# Patient Record
Sex: Male | Born: 1935 | Race: White | Hispanic: No | Smoking: Never smoker
Health system: Southern US, Community
[De-identification: ages and names within clinical notes are randomized; demographics above are authoritative.]

## PROBLEM LIST (undated history)

## (undated) DIAGNOSIS — Z87442 Personal history of urinary calculi: Secondary | ICD-10-CM

## (undated) DIAGNOSIS — T8859XA Other complications of anesthesia, initial encounter: Secondary | ICD-10-CM

## (undated) DIAGNOSIS — E785 Hyperlipidemia, unspecified: Secondary | ICD-10-CM

## (undated) DIAGNOSIS — T4145XA Adverse effect of unspecified anesthetic, initial encounter: Secondary | ICD-10-CM

## (undated) DIAGNOSIS — K219 Gastro-esophageal reflux disease without esophagitis: Secondary | ICD-10-CM

## (undated) DIAGNOSIS — K4021 Bilateral inguinal hernia, without obstruction or gangrene, recurrent: Secondary | ICD-10-CM

## (undated) DIAGNOSIS — F0390 Unspecified dementia without behavioral disturbance: Secondary | ICD-10-CM

## (undated) DIAGNOSIS — I10 Essential (primary) hypertension: Secondary | ICD-10-CM

## (undated) DIAGNOSIS — I639 Cerebral infarction, unspecified: Secondary | ICD-10-CM

## (undated) DIAGNOSIS — I4891 Unspecified atrial fibrillation: Secondary | ICD-10-CM

## (undated) DIAGNOSIS — N200 Calculus of kidney: Secondary | ICD-10-CM

## (undated) DIAGNOSIS — M1990 Unspecified osteoarthritis, unspecified site: Secondary | ICD-10-CM

## (undated) HISTORY — PX: EYE SURGERY: SHX253

## (undated) HISTORY — PX: HERNIA REPAIR: SHX51

---

## 2013-07-25 ENCOUNTER — Ambulatory Visit: Payer: Self-pay | Admitting: Urology

## 2013-10-22 DIAGNOSIS — Z1630 Resistance to unspecified antimicrobial drugs: Secondary | ICD-10-CM | POA: Insufficient documentation

## 2014-02-20 DIAGNOSIS — I4891 Unspecified atrial fibrillation: Secondary | ICD-10-CM

## 2014-02-20 HISTORY — DX: Unspecified atrial fibrillation: I48.91

## 2014-09-20 DIAGNOSIS — G301 Alzheimer's disease with late onset: Secondary | ICD-10-CM

## 2014-09-20 DIAGNOSIS — F0280 Dementia in other diseases classified elsewhere without behavioral disturbance: Secondary | ICD-10-CM | POA: Insufficient documentation

## 2014-10-13 DIAGNOSIS — R194 Change in bowel habit: Secondary | ICD-10-CM | POA: Insufficient documentation

## 2014-10-13 DIAGNOSIS — R198 Other specified symptoms and signs involving the digestive system and abdomen: Secondary | ICD-10-CM | POA: Insufficient documentation

## 2014-10-13 DIAGNOSIS — K529 Noninfective gastroenteritis and colitis, unspecified: Secondary | ICD-10-CM | POA: Insufficient documentation

## 2014-10-14 ENCOUNTER — Other Ambulatory Visit
Admission: RE | Admit: 2014-10-14 | Discharge: 2014-10-14 | Disposition: A | Discharge status: Home / Self Care | Payer: Medicare Other | Source: Ambulatory Visit | Attending: Nurse Practitioner | Admitting: Nurse Practitioner

## 2014-10-14 DIAGNOSIS — K529 Noninfective gastroenteritis and colitis, unspecified: Secondary | ICD-10-CM | POA: Insufficient documentation

## 2014-10-14 LAB — C DIFFICILE QUICK SCREEN W PCR REFLEX
C Diff antigen: NEGATIVE
C Diff interpretation: NEGATIVE
C Diff toxin: NEGATIVE

## 2014-12-09 ENCOUNTER — Other Ambulatory Visit: Payer: Self-pay

## 2014-12-09 ENCOUNTER — Inpatient Hospital Stay
Admission: EM | Admit: 2014-12-09 | Discharge: 2014-12-14 | DRG: 872 | Disposition: A | Discharge status: Home / Self Care | Payer: Medicare Other | Attending: Internal Medicine | Admitting: Internal Medicine

## 2014-12-09 ENCOUNTER — Encounter: Payer: Self-pay | Admitting: *Deleted

## 2014-12-09 ENCOUNTER — Emergency Department: Payer: Medicare Other

## 2014-12-09 DIAGNOSIS — N200 Calculus of kidney: Secondary | ICD-10-CM

## 2014-12-09 DIAGNOSIS — A419 Sepsis, unspecified organism: Secondary | ICD-10-CM | POA: Diagnosis not present

## 2014-12-09 DIAGNOSIS — R188 Other ascites: Secondary | ICD-10-CM | POA: Diagnosis present

## 2014-12-09 DIAGNOSIS — Z9889 Other specified postprocedural states: Secondary | ICD-10-CM

## 2014-12-09 DIAGNOSIS — I4891 Unspecified atrial fibrillation: Secondary | ICD-10-CM | POA: Diagnosis present

## 2014-12-09 DIAGNOSIS — M549 Dorsalgia, unspecified: Secondary | ICD-10-CM | POA: Diagnosis not present

## 2014-12-09 DIAGNOSIS — E785 Hyperlipidemia, unspecified: Secondary | ICD-10-CM | POA: Diagnosis present

## 2014-12-09 DIAGNOSIS — Z79899 Other long term (current) drug therapy: Secondary | ICD-10-CM

## 2014-12-09 DIAGNOSIS — N179 Acute kidney failure, unspecified: Secondary | ICD-10-CM | POA: Diagnosis present

## 2014-12-09 DIAGNOSIS — I10 Essential (primary) hypertension: Secondary | ICD-10-CM | POA: Diagnosis present

## 2014-12-09 DIAGNOSIS — N132 Hydronephrosis with renal and ureteral calculous obstruction: Secondary | ICD-10-CM | POA: Diagnosis present

## 2014-12-09 DIAGNOSIS — N3000 Acute cystitis without hematuria: Secondary | ICD-10-CM

## 2014-12-09 DIAGNOSIS — N1330 Unspecified hydronephrosis: Secondary | ICD-10-CM

## 2014-12-09 DIAGNOSIS — Z87442 Personal history of urinary calculi: Secondary | ICD-10-CM

## 2014-12-09 DIAGNOSIS — N390 Urinary tract infection, site not specified: Secondary | ICD-10-CM | POA: Diagnosis present

## 2014-12-09 DIAGNOSIS — I472 Ventricular tachycardia: Secondary | ICD-10-CM | POA: Diagnosis present

## 2014-12-09 DIAGNOSIS — Z8249 Family history of ischemic heart disease and other diseases of the circulatory system: Secondary | ICD-10-CM

## 2014-12-09 DIAGNOSIS — E119 Type 2 diabetes mellitus without complications: Secondary | ICD-10-CM | POA: Diagnosis present

## 2014-12-09 HISTORY — DX: Essential (primary) hypertension: I10

## 2014-12-09 HISTORY — DX: Unspecified atrial fibrillation: I48.91

## 2014-12-09 HISTORY — DX: Calculus of kidney: N20.0

## 2014-12-09 HISTORY — DX: Hyperlipidemia, unspecified: E78.5

## 2014-12-09 LAB — CBC WITH DIFFERENTIAL/PLATELET
BASOS ABS: 0.1 10*3/uL (ref 0–0.1)
BASOS PCT: 0 %
EOS ABS: 0 10*3/uL (ref 0–0.7)
EOS PCT: 0 %
HCT: 45.6 % (ref 40.0–52.0)
Hemoglobin: 15 g/dL (ref 13.0–18.0)
Lymphocytes Relative: 2 %
Lymphs Abs: 0.5 10*3/uL — ABNORMAL LOW (ref 1.0–3.6)
MCH: 30.1 pg (ref 26.0–34.0)
MCHC: 32.8 g/dL (ref 32.0–36.0)
MCV: 91.6 fL (ref 80.0–100.0)
MONO ABS: 1.2 10*3/uL — AB (ref 0.2–1.0)
MONOS PCT: 6 %
NEUTROS ABS: 20.9 10*3/uL — AB (ref 1.4–6.5)
Neutrophils Relative %: 92 %
PLATELETS: 321 10*3/uL (ref 150–440)
RBC: 4.98 MIL/uL (ref 4.40–5.90)
RDW: 13.6 % (ref 11.5–14.5)
WBC: 22.8 10*3/uL — ABNORMAL HIGH (ref 3.8–10.6)

## 2014-12-09 LAB — PROTIME-INR
INR: 1.68
PROTHROMBIN TIME: 20 s — AB (ref 11.4–15.0)

## 2014-12-09 LAB — BLOOD GAS, VENOUS
Acid-Base Excess: 1.1 mmol/L (ref 0.0–3.0)
Bicarbonate: 22.9 mEq/L (ref 21.0–28.0)
FIO2: 0.21
PATIENT TEMPERATURE: 37
pCO2, Ven: 28 mmHg — ABNORMAL LOW (ref 44.0–60.0)
pH, Ven: 7.52 — ABNORMAL HIGH (ref 7.320–7.430)

## 2014-12-09 LAB — URINALYSIS COMPLETE WITH MICROSCOPIC (ARMC ONLY)
BACTERIA UA: NONE SEEN
Bilirubin Urine: NEGATIVE
GLUCOSE, UA: NEGATIVE mg/dL
KETONES UR: NEGATIVE mg/dL
NITRITE: NEGATIVE
PH: 5 (ref 5.0–8.0)
Protein, ur: NEGATIVE mg/dL
Specific Gravity, Urine: 1.018 (ref 1.005–1.030)

## 2014-12-09 LAB — COMPREHENSIVE METABOLIC PANEL
ALBUMIN: 4.1 g/dL (ref 3.5–5.0)
ALK PHOS: 82 U/L (ref 38–126)
ALT: 18 U/L (ref 17–63)
AST: 25 U/L (ref 15–41)
Anion gap: 16 — ABNORMAL HIGH (ref 5–15)
BILIRUBIN TOTAL: 1.8 mg/dL — AB (ref 0.3–1.2)
BUN: 47 mg/dL — AB (ref 6–20)
CALCIUM: 9.7 mg/dL (ref 8.9–10.3)
CO2: 23 mmol/L (ref 22–32)
CREATININE: 3.23 mg/dL — AB (ref 0.61–1.24)
Chloride: 99 mmol/L — ABNORMAL LOW (ref 101–111)
GFR calc Af Amer: 19 mL/min — ABNORMAL LOW (ref >60)
GFR calc non Af Amer: 17 mL/min — ABNORMAL LOW (ref >60)
GLUCOSE: 184 mg/dL — AB (ref 65–99)
Potassium: 4.5 mmol/L (ref 3.5–5.1)
Sodium: 138 mmol/L (ref 135–145)
TOTAL PROTEIN: 7.6 g/dL (ref 6.5–8.1)

## 2014-12-09 LAB — LACTIC ACID, PLASMA: Lactic Acid, Venous: 2.9 mmol/L (ref 0.5–2.0)

## 2014-12-09 LAB — TROPONIN I: Troponin I: <0.03 ng/mL (ref <0.031)

## 2014-12-09 LAB — APTT: aPTT: 37 seconds — ABNORMAL HIGH (ref 24–36)

## 2014-12-09 MED ORDER — PIPERACILLIN-TAZOBACTAM 4.5 G IVPB: 4.5000 g | Freq: Three times a day (TID) | INTRAVENOUS | Status: DC

## 2014-12-09 MED ORDER — SODIUM CHLORIDE 0.9 % IV BOLUS (SEPSIS)
1000.0000 mL | Freq: Once | INTRAVENOUS | Status: AC
  Administered 2014-12-09: 1000 mL via INTRAVENOUS

## 2014-12-09 MED ORDER — FENTANYL CITRATE (PF) 100 MCG/2ML IJ SOLN
50.0000 ug | Freq: Once | INTRAMUSCULAR | Status: AC
  Administered 2014-12-09: 50 ug via INTRAVENOUS
  Filled 2014-12-09: qty 2

## 2014-12-09 NOTE — Progress Notes (Addendum)
ANTIBIOTIC CONSULT NOTE - INITIAL  Pharmacy Consult for Zosyn dosing Indication: intra-abdominal infection  No Known Allergies  Patient Measurements: Height: 5\' 5"  (165.1 cm) Weight: 148 lb (67.132 kg) IBW/kg (Calculated) : 61.5 Adjusted Body Weight: n/a  Vital Signs: Temp: 97.9 F (36.6 C) (10/19 2133) Temp Source: Oral (10/19 2133) BP: 111/72 mmHg (10/19 2316) Pulse Rate: 95 (10/19 2316) Intake/Output from previous day:   Intake/Output from this shift:    Labs:  Recent Labs  12/09/14 2144  WBC 22.8*  HGB 15.0  PLT 321   CrCl cannot be calculated (Patient has no serum creatinine result on file.). No results for input(s): VANCOTROUGH, VANCOPEAK, VANCORANDOM, GENTTROUGH, GENTPEAK, GENTRANDOM, TOBRATROUGH, TOBRAPEAK, TOBRARND, AMIKACINPEAK, AMIKACINTROU, AMIKACIN in the last 72 hours.   Microbiology: No results found for this or any previous visit (from the past 720 hour(s)).  Medical History: No past medical history on file.  Medications:   Assessment: UA: LE(+) NO2(-) WBC 6-30 Blood and urine cx pending  Goal of Therapy:  Resolution of infection  Plan:  Zosyn 3.375 grams q 12 hours ordered for CrCl<20 mL/min. May need to increase as renal function improves.   Abou Sterkel S 12/09/2014,11:42 PM

## 2014-12-09 NOTE — ED Provider Notes (Addendum)
[facility] Emergency Department Provider Note  ____________________________________________   I have reviewed the triage vital signs and the nursing notes.   HISTORY  Chief Complaint Abdominal Pain    HPI William RegulusJames B Alejos Sr. is a 81 y.o. male with multiple medical problems including atrial fibrillation, kidney stones presents today with 1 week of nausea vomiting and diarrhea in 4-5 days of back pain on the left back. He has had no fevers that he can think of. He was started on Cipro by the [facility]. He has had no chest pain and no shortness of breath. He states that the pain is severe and persistent. He also has lower abdominal pain. He has not had any hematemesis or bright red blood per rectum he denies dysuria.   No past medical history on file.  There are no active problems to display for this patient.   No past surgical history on file.  No current outpatient prescriptions on file.  Allergies Review of patient's allergies indicates no known allergies.  No family history on file.  Social History Social History  Substance Use Topics  . Smoking status: Never Smoker   . Smokeless tobacco: Not on file  . Alcohol Use: No    Review of Systems Constitutional: No fever/chills Eyes: No visual changes. ENT: No sore throat. No stiff neck no neck pain Cardiovascular: Denies chest pain. Respiratory: Denies shortness of breath. Gastrointestinal:   no vomiting.  No diarrhea.  No constipation. Genitourinary: Negative for dysuria. Musculoskeletal: Negative lower extremity swelling Skin: Negative for rash. Neurological: Negative for headaches, focal weakness or numbness. 10-point ROS otherwise negative.  ____________________________________________   PHYSICAL EXAM:  VITAL SIGNS: ED Triage Vitals  Enc Vitals Group     BP 12/09/14 2133 116/54 mmHg     Pulse Rate 12/09/14 2133 92     Resp 12/09/14 2133 20     Temp 12/09/14 2133 97.9 F (36.6 C)      Temp Source 12/09/14 2133 Oral     SpO2 12/09/14 2133 100 %     Weight 12/09/14 2133 148 lb (67.132 kg)     Height 12/09/14 2133 5\' 5"  (1.651 m)     Head Cir --      Peak Flow --      Pain Score 12/09/14 2135 10     Pain Loc --      Pain Edu? --      Excl. in GC? --     Constitutional: Alert and oriented. Well appearing and in no acute distress. Eyes: Conjunctivae are normal. PERRL. EOMI. Head: Atraumatic. Nose: No congestion/rhinnorhea. Mouth/Throat: Mucous membranes are moist.  Oropharynx non-erythematous. Neck: No stridor.   Nontender with no meningismus Cardiovascular: Normal rate, regular rhythm. Grossly normal heart sounds.  Good peripheral circulation. Respiratory: Normal respiratory effort.  No retractions. Lungs CTAB. Gastrointestinal:there is some mild suprapubic tenderness distention. No guarding no rebound Back:  There is no focal tenderness or step off there is no midline tenderness there are no lesions noted. thereminimal left-sided CVA tendernesssculoskeletal: No lower extremity tenderness. No joint effusions, no DVT signs strong distal pulses no edema Neurologic:  Normal speech and language. No gross focal neurologic deficits are appreciated.  Skin:  Skin is warm, dry and intact. No rash noted. Psychiatric: Mood and affect are normal. Speech and behavior are normal.  ____________________________________________   LABS (all labs ordered are listed, but only abnormal results are displayed)  Labs Reviewed  CBC WITH DIFFERENTIAL/PLATELET - Abnormal; Notable for  the following:    WBC 22.8 (*)    Neutro Abs 20.9 (*)    Lymphs Abs 0.5 (*)    Monocytes Absolute 1.2 (*)    All other components within normal limits  URINALYSIS COMPLETEWITH MICROSCOPIC ([facility] ONLY) - Abnormal; Notable for the following:    Color, Urine YELLOW (*)    APPearance CLOUDY (*)    Hgb urine dipstick 3+ (*)    Leukocytes, UA 1+ (*)    Squamous Epithelial / LPF 0-5 (*)    All other  components within normal limits  TROPONIN I  COMPREHENSIVE METABOLIC PANEL  LACTIC ACID, PLASMA  LACTIC ACID, PLASMA  BLOOD GAS, VENOUS  PROTIME-INR  APTT   ____________________________________________  EKG I personally interpreted interpreted EKG EKG shows atrial fibrillation with what appears to be nonsustained V. tach, 4 beats no acute ST elevation or depression nonspecific ST changes ____________________________________________  RADIOLOGY   I have personally looked at CT   ________________________________________   PROCEDURES  Procedure(s) performed: None  Critical Care performed: None  ____________________________________________   INITIAL IMPRESSION / ASSESSMENT AND PLAN / ED COURSE  Pertinent labs & imaging results that were available during my care of the patient were reviewed by me and considered in my medical decision making (see chart for details). CONCERNING HISTORY WITH AN ELDERLY GENTLEMAN WITH LOW BACK PAIN ON THE LEFT FOR 4 DAYS AS WELL AS NAUSEA VOMITING AND DIARRHEA. WHITE COUNT IS ELEVATED. BLOOD PRESSURE AND VITAL SIGNS ARE REASSURING AT THIS TIME. WE'RE GIVING HIM IV FLUID AS WELL as pain medication. We are awaiting results of CT given his history of kidney stones. Ischemia is also possibility although I have lower suspicion of that. We will see what CT and blood work shows. Considered also exists for an occasional run of NSVT which is not causing any chest pain or shortness of breath. Electrolytes are pending. Signed out to Dr. Manson Passey at the end of my shift   FINAL CLINICAL IMPRESSION(S) / ED DIAGNOSES  Final diagnoses:  Back pain     Jeanmarie Plant, MD 12/09/14 2312  Jeanmarie Plant, MD 12/09/14 2329  Jeanmarie Plant, MD 12/09/14 2332

## 2014-12-09 NOTE — ED Notes (Addendum)
Pt to triage via wheelchair.  Pt reports abd pain for 1 week. [facility].  States worse tonight with vomiting and nausea. .  Pt was started on cipro and flagyl today for food poisoning.  Pt also reports xrays showed kidney stones.  Pt also has low back pain.

## 2014-12-10 ENCOUNTER — Inpatient Hospital Stay: Payer: Medicare Other

## 2014-12-10 ENCOUNTER — Inpatient Hospital Stay: Payer: Medicare Other | Admitting: Anesthesiology

## 2014-12-10 ENCOUNTER — Other Ambulatory Visit (HOSPITAL_COMMUNITY): Payer: Self-pay | Admitting: Internal Medicine

## 2014-12-10 ENCOUNTER — Inpatient Hospital Stay: Admit: 2014-12-10 | Payer: Self-pay | Admitting: Urology

## 2014-12-10 ENCOUNTER — Encounter
Admission: EM | Disposition: A | Discharge status: Home / Self Care | Payer: Self-pay | Source: Home / Self Care | Attending: Internal Medicine

## 2014-12-10 ENCOUNTER — Encounter: Payer: Self-pay | Admitting: Anesthesiology

## 2014-12-10 DIAGNOSIS — Z79899 Other long term (current) drug therapy: Secondary | ICD-10-CM | POA: Diagnosis not present

## 2014-12-10 DIAGNOSIS — N178 Other acute kidney failure: Secondary | ICD-10-CM

## 2014-12-10 DIAGNOSIS — Z8249 Family history of ischemic heart disease and other diseases of the circulatory system: Secondary | ICD-10-CM | POA: Diagnosis not present

## 2014-12-10 DIAGNOSIS — A419 Sepsis, unspecified organism: Secondary | ICD-10-CM | POA: Diagnosis present

## 2014-12-10 DIAGNOSIS — I10 Essential (primary) hypertension: Secondary | ICD-10-CM | POA: Diagnosis not present

## 2014-12-10 DIAGNOSIS — N179 Acute kidney failure, unspecified: Secondary | ICD-10-CM | POA: Diagnosis not present

## 2014-12-10 DIAGNOSIS — E119 Type 2 diabetes mellitus without complications: Secondary | ICD-10-CM | POA: Diagnosis not present

## 2014-12-10 DIAGNOSIS — N132 Hydronephrosis with renal and ureteral calculous obstruction: Secondary | ICD-10-CM | POA: Diagnosis not present

## 2014-12-10 DIAGNOSIS — R188 Other ascites: Secondary | ICD-10-CM | POA: Diagnosis not present

## 2014-12-10 DIAGNOSIS — N390 Urinary tract infection, site not specified: Secondary | ICD-10-CM | POA: Diagnosis not present

## 2014-12-10 DIAGNOSIS — I472 Ventricular tachycardia: Secondary | ICD-10-CM | POA: Diagnosis not present

## 2014-12-10 DIAGNOSIS — Z9889 Other specified postprocedural states: Secondary | ICD-10-CM | POA: Diagnosis not present

## 2014-12-10 DIAGNOSIS — M549 Dorsalgia, unspecified: Secondary | ICD-10-CM | POA: Diagnosis present

## 2014-12-10 DIAGNOSIS — Z87442 Personal history of urinary calculi: Secondary | ICD-10-CM | POA: Diagnosis not present

## 2014-12-10 DIAGNOSIS — E785 Hyperlipidemia, unspecified: Secondary | ICD-10-CM | POA: Diagnosis not present

## 2014-12-10 DIAGNOSIS — I4891 Unspecified atrial fibrillation: Secondary | ICD-10-CM | POA: Diagnosis not present

## 2014-12-10 HISTORY — PX: CYSTOSCOPY WITH STENT PLACEMENT: SHX5790

## 2014-12-10 HISTORY — PX: CYSTOSCOPY WITH RETROGRADE PYELOGRAM, URETEROSCOPY AND STENT PLACEMENT: SHX5789

## 2014-12-10 LAB — TYPE AND SCREEN
ABO/RH(D): O POS
Antibody Screen: NEGATIVE

## 2014-12-10 LAB — TSH: TSH: 3.193 u[IU]/mL (ref 0.350–4.500)

## 2014-12-10 LAB — LACTIC ACID, PLASMA: Lactic Acid, Venous: 1.4 mmol/L (ref 0.5–2.0)

## 2014-12-10 SURGERY — CYSTOSCOPY, WITH STENT INSERTION
Anesthesia: General | Laterality: Bilateral

## 2014-12-10 MED ORDER — FENTANYL CITRATE (PF) 100 MCG/2ML IJ SOLN
25.0000 ug | Freq: Once | INTRAMUSCULAR | Status: AC
  Administered 2014-12-10: 25 ug via INTRAVENOUS

## 2014-12-10 MED ORDER — ACETAMINOPHEN 10 MG/ML IV SOLN
INTRAVENOUS | Status: DC | PRN
  Administered 2014-12-10: 1000 mg via INTRAVENOUS

## 2014-12-10 MED ORDER — SODIUM CHLORIDE 0.9 % IV SOLN
INTRAVENOUS | Status: DC | PRN
  Administered 2014-12-10: 01:00:00 via INTRAVENOUS

## 2014-12-10 MED ORDER — DILTIAZEM HCL 25 MG/5ML IV SOLN
INTRAVENOUS | Status: DC | PRN
Start: 2014-12-10 — End: 2014-12-10
  Administered 2014-12-10: 10 mg via INTRAVENOUS

## 2014-12-10 MED ORDER — MORPHINE SULFATE (PF) 2 MG/ML IV SOLN: 1.0000 mg | INTRAVENOUS | Status: DC | PRN

## 2014-12-10 MED ORDER — FENTANYL CITRATE (PF) 100 MCG/2ML IJ SOLN
INTRAMUSCULAR | Status: AC
  Administered 2014-12-10: 25 ug via INTRAVENOUS
  Filled 2014-12-10: qty 2

## 2014-12-10 MED ORDER — SIMVASTATIN 20 MG PO TABS
20.0000 mg | ORAL_TABLET | Freq: Every day | ORAL | Status: DC
  Administered 2014-12-10 – 2014-12-14 (×5): 20 mg via ORAL
  Filled 2014-12-10 (×5): qty 1

## 2014-12-10 MED ORDER — DILTIAZEM HCL 25 MG/5ML IV SOLN
INTRAVENOUS | Status: AC
  Filled 2014-12-10: qty 5

## 2014-12-10 MED ORDER — VANCOMYCIN HCL IN DEXTROSE 1-5 GM/200ML-% IV SOLN
1000.0000 mg | Freq: Once | INTRAVENOUS | Status: AC
  Administered 2014-12-10: 1000 mg via INTRAVENOUS
  Filled 2014-12-10: qty 200

## 2014-12-10 MED ORDER — FENTANYL CITRATE (PF) 100 MCG/2ML IJ SOLN: 25.0000 ug | INTRAMUSCULAR | Status: DC | PRN

## 2014-12-10 MED ORDER — PIPERACILLIN-TAZOBACTAM 3.375 G IVPB
INTRAVENOUS | Status: AC
  Filled 2014-12-10: qty 50

## 2014-12-10 MED ORDER — ONDANSETRON HCL 4 MG/2ML IJ SOLN: 4.0000 mg | Freq: Four times a day (QID) | INTRAMUSCULAR | Status: DC | PRN

## 2014-12-10 MED ORDER — VANCOMYCIN HCL IN DEXTROSE 1-5 GM/200ML-% IV SOLN
1000.0000 mg | Freq: Once | INTRAVENOUS | Status: DC
Start: 2014-12-10 — End: 2014-12-10
  Filled 2014-12-10: qty 200

## 2014-12-10 MED ORDER — PROPOFOL 10 MG/ML IV BOLUS
INTRAVENOUS | Status: DC | PRN
  Administered 2014-12-10: 150 mg via INTRAVENOUS

## 2014-12-10 MED ORDER — SODIUM CHLORIDE 0.9 % IV SOLN
INTRAVENOUS | Status: DC
  Administered 2014-12-10 – 2014-12-14 (×10): via INTRAVENOUS

## 2014-12-10 MED ORDER — ONDANSETRON HCL 4 MG PO TABS: 4.0000 mg | ORAL_TABLET | Freq: Four times a day (QID) | ORAL | Status: DC | PRN

## 2014-12-10 MED ORDER — ACETAMINOPHEN 10 MG/ML IV SOLN
INTRAVENOUS | Status: AC
  Filled 2014-12-10: qty 100

## 2014-12-10 MED ORDER — METOPROLOL SUCCINATE ER 25 MG PO TB24
25.0000 mg | ORAL_TABLET | Freq: Every day | ORAL | Status: DC
  Administered 2014-12-11 – 2014-12-14 (×4): 25 mg via ORAL
  Filled 2014-12-10 (×5): qty 1

## 2014-12-10 MED ORDER — FENTANYL CITRATE (PF) 100 MCG/2ML IJ SOLN
INTRAMUSCULAR | Status: DC | PRN
  Administered 2014-12-10: 100 ug via INTRAVENOUS

## 2014-12-10 MED ORDER — ONDANSETRON 4 MG PO TBDP: 4.0000 mg | ORAL_TABLET | Freq: Three times a day (TID) | ORAL | Status: DC | PRN

## 2014-12-10 MED ORDER — PANTOPRAZOLE SODIUM 40 MG PO TBEC
40.0000 mg | DELAYED_RELEASE_TABLET | Freq: Every day | ORAL | Status: DC
  Administered 2014-12-10 – 2014-12-14 (×5): 40 mg via ORAL
  Filled 2014-12-10 (×5): qty 1

## 2014-12-10 MED ORDER — SODIUM CHLORIDE 0.9 % IV BOLUS (SEPSIS)
1000.0000 mL | Freq: Once | INTRAVENOUS | Status: AC
  Administered 2014-12-10: 1000 mL via INTRAVENOUS

## 2014-12-10 MED ORDER — ONDANSETRON HCL 4 MG/2ML IJ SOLN
4.0000 mg | Freq: Once | INTRAMUSCULAR | Status: AC
  Administered 2014-12-10: 4 mg via INTRAVENOUS

## 2014-12-10 MED ORDER — APIXABAN 5 MG PO TABS
5.0000 mg | ORAL_TABLET | Freq: Two times a day (BID) | ORAL | Status: DC
  Administered 2014-12-10 – 2014-12-14 (×9): 5 mg via ORAL
  Filled 2014-12-10 (×9): qty 1

## 2014-12-10 MED ORDER — IOTHALAMATE MEGLUMINE 43 % IV SOLN
INTRAVENOUS | Status: DC | PRN
  Administered 2014-12-10: 25 mL via URETHRAL

## 2014-12-10 MED ORDER — PHENYLEPHRINE HCL 10 MG/ML IJ SOLN
INTRAMUSCULAR | Status: DC | PRN
  Administered 2014-12-10 (×2): 200 ug via INTRAVENOUS

## 2014-12-10 MED ORDER — ONDANSETRON HCL 4 MG/2ML IJ SOLN
INTRAMUSCULAR | Status: AC
  Filled 2014-12-10: qty 2

## 2014-12-10 MED ORDER — ACETAMINOPHEN 325 MG PO TABS
650.0000 mg | ORAL_TABLET | Freq: Four times a day (QID) | ORAL | Status: DC | PRN
  Administered 2014-12-11 – 2014-12-12 (×2): 650 mg via ORAL
  Filled 2014-12-10 (×2): qty 2

## 2014-12-10 MED ORDER — FENTANYL CITRATE (PF) 100 MCG/2ML IJ SOLN
INTRAMUSCULAR | Status: AC
  Filled 2014-12-10: qty 2

## 2014-12-10 MED ORDER — HYDROMORPHONE HCL 1 MG/ML IJ SOLN
INTRAMUSCULAR | Status: DC | PRN
  Administered 2014-12-10 (×2): 0.5 mg via INTRAVENOUS

## 2014-12-10 MED ORDER — ACETAMINOPHEN 650 MG RE SUPP: 650.0000 mg | Freq: Four times a day (QID) | RECTAL | Status: DC | PRN

## 2014-12-10 MED ORDER — VANCOMYCIN HCL IN DEXTROSE 750-5 MG/150ML-% IV SOLN
750.0000 mg | INTRAVENOUS | Status: DC
  Administered 2014-12-11: 750 mg via INTRAVENOUS
  Filled 2014-12-10 (×2): qty 150

## 2014-12-10 MED ORDER — SODIUM CHLORIDE 0.9 % IJ SOLN: 3.0000 mL | Freq: Two times a day (BID) | INTRAMUSCULAR | Status: DC

## 2014-12-10 MED ORDER — ONDANSETRON HCL 4 MG/2ML IJ SOLN: 4.0000 mg | Freq: Once | INTRAMUSCULAR | Status: DC | PRN

## 2014-12-10 MED ORDER — PIPERACILLIN-TAZOBACTAM 3.375 G IVPB
3.3750 g | Freq: Two times a day (BID) | INTRAVENOUS | Status: DC
  Administered 2014-12-10 (×3): 3.375 g via INTRAVENOUS
  Filled 2014-12-10 (×3): qty 50

## 2014-12-10 MED ORDER — LIDOCAINE HCL (CARDIAC) 20 MG/ML IV SOLN
INTRAVENOUS | Status: DC | PRN
  Administered 2014-12-10: 100 mg via INTRAVENOUS

## 2014-12-10 MED ORDER — PNEUMOCOCCAL VAC POLYVALENT 25 MCG/0.5ML IJ INJ
0.5000 mL | INJECTION | INTRAMUSCULAR | Status: AC
  Administered 2014-12-11: 0.5 mL via INTRAMUSCULAR
  Filled 2014-12-10: qty 0.5

## 2014-12-10 MED ORDER — GLYCOPYRROLATE 0.2 MG/ML IJ SOLN
INTRAMUSCULAR | Status: DC | PRN
  Administered 2014-12-10: 0.2 mg via INTRAVENOUS

## 2014-12-10 SURGICAL SUPPLY — 28 items
BACTOSHIELD CHG 4% 4OZ (MISCELLANEOUS) ×2
BAG DRAIN CYSTO-URO LG1000N (MISCELLANEOUS) ×3 IMPLANT
CATH FOLEY 2WAY  5CC 16FR (CATHETERS) ×2
CATH URETL 5X70 OPEN END (CATHETERS) ×3 IMPLANT
CATH URTH 16FR FL 2W BLN LF (CATHETERS) ×1 IMPLANT
CONRAY 43 FOR UROLOGY 50M (MISCELLANEOUS) ×3 IMPLANT
FEE TECHNICIAN ONLY PER HOUR (MISCELLANEOUS) IMPLANT
GLOVE BIO SURGEON STRL SZ7 (GLOVE) ×6 IMPLANT
GLOVE BIO SURGEON STRL SZ7.5 (GLOVE) ×3 IMPLANT
GOWN STRL REUS W/ TWL LRG LVL4 (GOWN DISPOSABLE) ×1 IMPLANT
GOWN STRL REUS W/ TWL XL LVL3 (GOWN DISPOSABLE) ×1 IMPLANT
GOWN STRL REUS W/TWL LRG LVL4 (GOWN DISPOSABLE) ×2
GOWN STRL REUS W/TWL XL LVL3 (GOWN DISPOSABLE) ×5 IMPLANT
INTRODUCER DILATOR DOUBLE (INTRODUCER) ×3 IMPLANT
KIT RM TURNOVER CYSTO AR (KITS) ×3 IMPLANT
LASER HOLMIUM FIBER SU 272UM (MISCELLANEOUS) IMPLANT
PACK CYSTO AR (MISCELLANEOUS) ×3 IMPLANT
SCRUB CHG 4% DYNA-HEX 4OZ (MISCELLANEOUS) ×1 IMPLANT
SENSORWIRE 0.038 NOT ANGLED (WIRE) ×3
SET CYSTO W/LG BORE CLAMP LF (SET/KITS/TRAYS/PACK) ×3 IMPLANT
SOL .9 NS 3000ML IRR  AL (IV SOLUTION) ×2
SOL .9 NS 3000ML IRR UROMATIC (IV SOLUTION) ×1 IMPLANT
STENT URET 6FRX24 CONTOUR (STENTS) IMPLANT
STENT URET 6FRX26 CONTOUR (STENTS) ×6 IMPLANT
SURGILUBE 2OZ TUBE FLIPTOP (MISCELLANEOUS) ×3 IMPLANT
SYRINGE IRR TOOMEY STRL 70CC (SYRINGE) ×3 IMPLANT
WATER STERILE IRR 1000ML POUR (IV SOLUTION) ×3 IMPLANT
WIRE SENSOR 0.038 NOT ANGLED (WIRE) ×1 IMPLANT

## 2014-12-10 NOTE — Progress Notes (Signed)
Notified Dr. [name], pt in Afib with heart rate up to 150. Dr. [name] in L&D, referred me to call Theodoro Gristave, CRNA.  CRNA admin 10 mg IV cardizem.  HR below 100.  Pt is resting with eyes closed in bed.

## 2014-12-10 NOTE — Progress Notes (Signed)
William MaudlinJames Lozano is a 81 y.o. male  <principal problem not specified>   SUBJECTIVE:  Pt admitted with bilateral hydronephrosis due to kidney stones with AKI and sepsis. Some rapid A-fib. Feels better after stent placement last night. Mildly hypotensive. Afebrile.  ______________________________________________________________________  ROS: Review of systems is unremarkable for any active cardiac,respiratory, GI, GU, hematologic, neurologic or psychiatric systems, 10 systems reviewed.  @CMEDLIST @  Past Medical History  Diagnosis Date  . Essential hypertension   . Atrial fibrillation (HCC)   . Kidney stones   . Hyperlipemia     Past Surgical History  Procedure Laterality Date  . Hernia repair Bilateral     inguinal    PHYSICAL EXAM:  BP 97/51 mmHg  Pulse 86  Temp(Src) 97.5 F (36.4 C) (Oral)  Resp 22  Ht 5\' 5"  (1.651 m)  Wt 70.444 kg (155 lb 4.8 oz)  BMI 25.84 kg/m2  SpO2 96%  Wt Readings from Last 3 Encounters:  12/10/14 70.444 kg (155 lb 4.8 oz)            Constitutional: NAD Neck: supple, no thyromegaly Respiratory: basilar rhonchi Cardiovascular: irregularly irregular, no murmur, no gallop Abdomen: soft, good BS, nontender Extremities: no edema Neuro: alert and oriented, no focal motor or sensory deficits  ASSESSMENT/PLAN:  Labs and imaging studies were reviewed  Will continue IV fluids and IV ABX. Clear liquid diet today. Check renal US. Await f/u from Urology. Cardiology consult ordered. Repeat labs in AM.

## 2014-12-10 NOTE — Progress Notes (Signed)
ANTIBIOTIC CONSULT NOTE - INITIAL  Pharmacy Consult for Vancyomycin/Zosyn dosing Indication: Sepsis/intra-abdominal infection  No Known Allergies  Patient Measurements: Height: 5\' 5"  (165.1 cm) Weight: 155 lb 4.8 oz (70.444 kg) IBW/kg (Calculated) : 61.5 Adjusted Body Weight: n/a  Vital Signs: Temp: 97.5 F (36.4 C) (10/20 0639) Temp Source: Oral (10/20 0639) BP: 97/51 mmHg (10/20 0727) Pulse Rate: 86 (10/20 0727) Intake/Output from previous day: 10/19 0701 - 10/20 0700 In: 1900 [I.V.:1900] Out: 400 [Urine:400] Intake/Output from this shift:    Labs:  Recent Labs  12/09/14 2144 12/09/14 2255  WBC 22.8*  --   HGB 15.0  --   PLT 321  --   CREATININE  --  3.23*   Estimated Creatinine Clearance: 15.9 mL/min (by C-G formula based on Cr of 3.23). No results for input(s): VANCOTROUGH, VANCOPEAK, VANCORANDOM, GENTTROUGH, GENTPEAK, GENTRANDOM, TOBRATROUGH, TOBRAPEAK, TOBRARND, AMIKACINPEAK, AMIKACINTROU, AMIKACIN in the last 72 hours.   Microbiology: No results found for this or any previous visit (from the past 720 hour(s)).  Medical History: Past Medical History  Diagnosis Date  . Essential hypertension   . Atrial fibrillation (HCC)   . Kidney stones   . Hyperlipemia     Medications:   Assessment: 81 yo male admitted for sepsis and possible intra-abdominal infection  Pharmacy consulted to dose vancomycin and zosyn.  UA: LE(+) NO2(-) WBC 6-30 Blood and urine cx pending  PK Parameters: Dosing Weight: 70.4 kg Ke: 0.017 T1/2: 40 hrs Vd: 49.3 L  Goal of Therapy:  Resolution of infection Vancomycin trough goal of 15-20  Plan:  Will start vancomycin 750mg  IV Q36 hours starting 18 hours after first dose for stacked dosing.  Trough prior to 4th dose. Zosyn 3.375 grams q 12 hours ordered for CrCl<20 mL/min. May need to increase antibiotic doses as renal function improves.  Thank you for the consult. Pharmacy will continue to follow with you.  Jacqualyn PoseyHaley E  Tiffani Kadow 12/10/2014,8:03 AM

## 2014-12-10 NOTE — Anesthesia Preprocedure Evaluation (Addendum)
Anesthesia Evaluation  Patient identified by MRN, date of birth, ID band Patient awake    Reviewed: Allergy & Precautions, NPO status , Patient's Chart, lab work & pertinent test results, reviewed documented beta blocker date and time   Airway Mallampati: II  TM Distance: >3 FB     Dental  (+) Chipped   Pulmonary           Cardiovascular hypertension, Pt. on medications and Pt. on home beta blockers + dysrhythmias Atrial Fibrillation      Neuro/Psych    GI/Hepatic   Endo/Other  diabetes, Type 2  Renal/GU Renal InsufficiencyRenal disease     Musculoskeletal   Abdominal   Peds  Hematology   Anesthesia Other Findings Hx of Afib. Renal insuff. INR elevated. Family states he ate at 1 pm, but vomited all of it. He has an empty stomach at present. Will proceed with LMA.  Reproductive/Obstetrics                         Anesthesia Physical Anesthesia Plan  ASA: III  Anesthesia Plan: General   Post-op Pain Management:    Induction: Intravenous  Airway Management Planned: Oral ETT and LMA  Additional Equipment:   Intra-op Plan:   Post-operative Plan:   Informed Consent: I have reviewed the patients History and Physical, chart, labs and discussed the procedure including the risks, benefits and alternatives for the proposed anesthesia with the patient or authorized representative who has indicated his/her understanding and acceptance.     Plan Discussed with: CRNA  Anesthesia Plan Comments:         Anesthesia Quick Evaluation

## 2014-12-10 NOTE — Progress Notes (Signed)
   12/10/14 1510  Clinical Encounter Type  Visited With Patient  Visit Type Initial;Spiritual support  Referral From Family  Consult/Referral To Chaplain  Spiritual Encounters  Spiritual Needs Prayer;Emotional  Stress Factors  Patient Stress Factors Exhausted;Health changes  Met w/patient. Provided pastoral care. Chap. Latifah Padin G. [place]

## 2014-12-10 NOTE — ED Provider Notes (Signed)
I assumed care of the patient at 1:00 PM from Dr. Alphonzo LemmingsMcShane. Review of the patient's CAT scan revealed: Results       CT RENAL STONE STUDY (Final result) Result time: 12/09/14 23:26:30   Final result by Rad Results In Interface (12/09/14 23:26:30)   Narrative:   CLINICAL DATA: Back and abdominal pain for 1 week. Worsening tonight with vomiting and nausea.  EXAM: CT ABDOMEN AND PELVIS WITHOUT CONTRAST  TECHNIQUE: Multidetector CT imaging of the abdomen and pelvis was performed following the standard protocol without IV contrast.  COMPARISON: CT 07/25/2013  FINDINGS: Lower chest: Linear atelectasis in the left lower lobe. The heart is mildly enlarged.  Liver: No evidence focal lesion allowing for lack contrast.  Hepatobiliary: Gallbladder physiologically distended, no calcified stone. No evidence of biliary dilatation.  Pancreas: No ductal dilatation or surrounding inflammation.  Spleen: Normal.  Adrenal glands: No nodule. Mild fullness bilaterally.  Kidneys: There is moderate bilateral hydroureteronephrosis and perinephric strandy. On the right this secondary to a 11 x 9 mm stone in the right mid ureter at the level of L3-L4. On the left this is secondary to 9 x 8 mm stone in the left mid distal ureter at the level of L5-S1. Additional nonobstructing stones in both kidneys. There is a cyst in the lower left kidney, additional small bilateral renal cysts and not well seen.  Stomach/Bowel: Stomach mildly distended with ingested contents. Small hiatal hernia. There are no dilated or thickened small bowel loops. Small volume of stool throughout the colon without colonic wall thickening. The colon is tortuous. The appendix is normal.  Vascular/Lymphatic: No retroperitoneal adenopathy. Abdominal aorta is normal in caliber. Moderate to dense atherosclerosis without aneurysm.  Reproductive: Prostate gland is enlarged measuring 5.4 cm transverse dimension.  Bladder:  Minimally distended. No wall thickening.  Other: No free air, free fluid, or intra-abdominal fluid collection. Post left inguinal hernia repair with mesh. There is fat in the right inguinal canal.  Musculoskeletal: There are no acute or suspicious osseous abnormalities. Stable degenerative change in the spine. Stable mild compression deformity superior endplate of L1.  IMPRESSION: 1. Bilateral obstructive uropathy with moderate bilateral hydroureteronephrosis. Large mid ureteral obstructing stones, 11 mm on the right and 9 mm on the left. Recommend urologic consultation. 2. Additional nonobstructing stones in both kidneys.   Electronically Signed By: Rubye OaksMelanie Ehinger M.D. On: 12/09/2014 23:26      Discussed with Dr.Budzvn urologist on-call recommendation for admission for bilateral urethral stents. Patient meets criteria for sepsis patient received 30 MLS per kilogram of IV normal saline vancomycin and Zosyn. She also discussed with Dr. Sheryle Haildiamond hospitalist on-call for evaluation. Critical care: CRITICAL CARE Performed by: Bayard MalesBROWN, [place]   Total critical care time: 30 minutes  Critical care time was exclusive of separately billable procedures and treating other patients.  Critical care was necessary to treat or prevent imminent or life-threatening deterioration.  Critical care was time spent personally by me on the following activities: development of treatment plan with patient and/or surrogate as well as nursing, discussions with consultants, evaluation of patient's response to treatment, examination of patient, obtaining history from patient or surrogate, ordering and performing treatments and interventions, ordering and review of laboratory studies, ordering and review of radiographic studies, pulse oximetry and re-evaluation of patient's condition.   Darci Currentandolph N Brown, MD 12/10/14 [phone]

## 2014-12-10 NOTE — Op Note (Signed)
Preoperative diagnosis: Bilateral obstructing ureteral stones, AKI, sepsis  Postoperative diagnosis: Same  Procedure: Cystoscopy, bilateral retrograde pyelogram, bilateral ureteral stent placement (6 [place] by 26 cm)  Surgeon: Baruch Gouty, M.D.  Findings: Bilateral hydronephrosis was noted on retrograde pyelogram. The right ureteral stent was easily placed. There was some difficulty placing the left ureteral stent due to the stent placement stone. The stent may be in the proximal ureter. There was some extravasation of contrast in the left retropyelogram. Regardless, there was a large amount of purulent discharge after stent placement from within the stent leading believe the stent is by passing the obstruction. The decision was made to leave the stent as it was as the patient is not able to undergo a nephrostomy tube now secondary to Eliquis and this was his only option for drainage of his obstructed system.  Drains: 6 [place] by 26 cm double-J ureteral stent bilaterally, 19 French Foley  Specimens: Urine culture from left ureter  Disposition: Stable to postanesthesia care unit  Indications for procedure: Patient's an 81 year old male presented with abdominal pain was done with bilateral obstructing stones. His white blood cell count is 22.8. His creatinine is 3.23. His lactic acid is 2.9. He is on Eliquis and unable to undergo percutaneous nephrostomy tube at this time.  Description of procedure: The patient was met in the preoperative area all risks, benefits, and indications of the procedure prescribed great detail. The patient consented to the procedure. Preoperative antibiotics were given. Symmetric the operative theater. Gen. anesthesia was induced per the anesthesia service. The patient was placed in dorsal lithotomy position prepped and draped in usual sterile fashion. Timeout was called. A 21 French 30 versus scope was inserted in the patient's bladder per urethra atraumatically the  right ureteral orifice was then visualized and intubated with a open ureteral catheter and a sensor wire was with some difficulty navigated past the stone and the open-ended ureteral catheter placement renal pelvis. The right renal pelvis was then aspirated and there is no nephrotic drip this time. The sensor wire was then replaced open wire was removed after retrograde pyelogram was obtained. The right retrograde pyelograms did show right hydronephrosis proximal to mid ureteral stone. A 6 French by 26 under double-J ureteral stent was then placed over the sensor wire and sensor wire removed. The correct placement of the stent was noted with a curl seen in the patient's right renal pelvis on fluoroscopy and a curl seen in the patient's urinary bladder on direct visualization. This process was repeated on the left side. It was much more difficult on this side due to the narrowness of the ureter secondary to the stone. We were able to navigate the open-ended catheter passed the ureteral stone over the sensor wire. There was immediate return of purulent discharge from  the left ureter once the stone was bypassed. The left ureter was allowed to fully decompress before performing further manipulation. This urine was sent for culture. A retrograde pyelogram obtained showed extravasation of contrast. A sensor wire was then replaced to what was thought to be the level the left renal pelvis though this was not clear due to the extravasation of contrast which was secondary to the tightness of the left ureter from the obstructing stone. A decision was then made at this time to place a left ureteral stent over the sensor wire. A 6 French by 26 double-J ureteral stent was then placed over the sensor wire and sensor wire removed. The correct placement was noted in  the bladder with direct visualization. There was immediate return of a large amount purulent discharge at this time. It was unclear if the left proximal ureteral stent  curl was in the renal pelvis due to the previously mentioned extravasation of contrast. However there continued be large amounts of purulent drainage from the left ureteral orifice and the left ureteral stent. At this time I was satisfied that I was able to bypass the obstruction, and his left kidney was draining. The decision was made not to further manipulate the stent due to the patient's overall septic status, the fact that the left ureter was draining, and the fact that the patient was not eligible for percutaneous nephrostomy tube due to his Eliquis. I was concerned that any further manipulation the stent would risk losing the current access and drainage of his left kidney. At this point the cystoscope was withdrawn and a 16 French Foley catheter was placed to dependent drainage  Plan: The patient will return to the floor and be admitted to the hospitalist. We will monitor his creatinine, white blood cell count and cultures. We may consider renal ultrasound tomorrow morning to better evaluate where the proximal left ureteral stent is placed.

## 2014-12-10 NOTE — Consult Note (Addendum)
 @ 12:41 AM   [name] Sr. January 09, 1933 [id number]  Referring provider: Dr. Manson Passey  Chief Complaint  Patient presents with  . Abdominal Pain    HPI: The patient is an 81 year old gentleman with a possible history of nephrolithiasis presents to the hospital with abdominal pain. He is diagnosed bilateral obstructing stones with bilateral hydroureteronephrosis. His white blood cell counts 22. His creatinine is 3.2. His lactic acid is 2.9. Vitals are otherwise stable this time. He has had some before. There treated with lithotripsy.  PMH: History reviewed. No pertinent past medical history.   Surgical History: History reviewed. No pertinent past surgical history.  Home Medications:    Medication List    ASK your doctor about these medications        apixaban 5 MG Tabs tablet  Commonly known as:  ELIQUIS  Take 5 mg by mouth 2 (two) times daily.     ciprofloxacin 250 MG tablet  Commonly known as:  CIPRO  Take 250 mg by mouth 2 (two) times daily.     hydrochlorothiazide 25 MG tablet  Commonly known as:  HYDRODIURIL  Take 25 mg by mouth daily.     HYDROcodone-acetaminophen 5-325 MG tablet  Commonly known as:  NORCO/VICODIN  Take 1 tablet by mouth every 4 (four) hours as needed for moderate pain.     metoprolol succinate 25 MG 24 hr tablet  Commonly known as:  TOPROL-XL  Take 25 mg by mouth daily.     ondansetron 4 MG disintegrating tablet  Commonly known as:  ZOFRAN-ODT  Take 4 mg by mouth every 8 (eight) hours as needed for nausea or vomiting.     pantoprazole 40 MG tablet  Commonly known as:  PROTONIX  Take 40 mg by mouth daily.     promethazine 12.5 MG tablet  Commonly known as:  PHENERGAN  Take 12.5 mg by mouth every 6 (six) hours as needed for nausea or vomiting.     simvastatin 20 MG tablet  Commonly known as:  ZOCOR  Take 20 mg by mouth daily.        Allergies: No Known Allergies  Family History: History reviewed. No pertinent family  history.  Social History:  reports that he has never smoked. He does not have any smokeless tobacco history on file. He reports that he does not drink alcohol. His drug history is not on file.  ROS: 12 point ROS reviewed in EMR and agreed upon.                                        Physical Exam: BP 111/76 mmHg  Pulse 84  Temp(Src) 97.9 F (36.6 C) (Oral)  Resp 16  Ht  (1.651 m)  Wt 148 lb (67.132 kg)  BMI 24.63 kg/m2  SpO2 100%  Constitutional:  Alert and oriented, No acute distress. HEENT: [place] AT, moist mucus membranes.  Trachea midline, no masses.  Cardiovascular: No clubbing, cyanosis, or edema. RRR. Respiratory: Normal respiratory effort, no increased work of breathing. GI: Abdomen is soft, nontender, nondistended, no abdominal masses GU: No CVA tenderness. Normal uncircumcised phallus. Testicles able bilaterally. No masses. Skin: No rashes, bruises or suspicious lesions. Lymph: No cervical or inguinal adenopathy. Neurologic: Grossly intact, no focal deficits, moving all 4 extremities. Psychiatric: Normal mood and affect.  Laboratory Data: Lab Results  Component Value Date   WBC 22.8* 12/09/2014  HGB 15.0 12/09/2014   HCT 45.6 12/09/2014   MCV 91.6 12/09/2014   PLT 321 12/09/2014    Lab Results  Component Value Date   CREATININE 3.23* 12/09/2014    No results found for: PSA  No results found for: TESTOSTERONE  No results found for: HGBA1C  Urinalysis    Component Value Date/Time   COLORURINE YELLOW* 12/09/2014 2144   APPEARANCEUR CLOUDY* 12/09/2014 2144   LABSPEC 1.018 12/09/2014 2144   PHURINE 5.0 12/09/2014 2144   GLUCOSEU NEGATIVE 12/09/2014 2144   HGBUR 3+* 12/09/2014 2144   BILIRUBINUR NEGATIVE 12/09/2014 2144   KETONESUR NEGATIVE 12/09/2014 2144   PROTEINUR NEGATIVE 12/09/2014 2144   NITRITE NEGATIVE 12/09/2014 2144   LEUKOCYTESUR 1+* 12/09/2014 2144    Pertinent Imaging: CLINICAL DATA: Back and abdominal pain  for 1 week. Worsening tonight with vomiting and nausea.  EXAM: CT ABDOMEN AND PELVIS WITHOUT CONTRAST  TECHNIQUE: Multidetector CT imaging of the abdomen and pelvis was performed following the standard protocol without IV contrast.  COMPARISON: CT 07/25/2013  FINDINGS: Lower chest: Linear atelectasis in the left lower lobe. The heart is mildly enlarged.  Liver: No evidence focal lesion allowing for lack contrast.  Hepatobiliary: Gallbladder physiologically distended, no calcified stone. No evidence of biliary dilatation.  Pancreas: No ductal dilatation or surrounding inflammation.  Spleen: Normal.  Adrenal glands: No nodule. Mild fullness bilaterally.  Kidneys: There is moderate bilateral hydroureteronephrosis and perinephric strandy. On the right this secondary to a 11 x 9 mm stone in the right mid ureter at the level of L3-L4. On the left this is secondary to 9 x 8 mm stone in the left mid distal ureter at the level of L5-S1. Additional nonobstructing stones in both kidneys. There is a cyst in the lower left kidney, additional small bilateral renal cysts and not well seen.  Stomach/Bowel: Stomach mildly distended with ingested contents. Small hiatal hernia. There are no dilated or thickened small bowel loops. Small volume of stool throughout the colon without colonic wall thickening. The colon is tortuous. The appendix is normal.  Vascular/Lymphatic: No retroperitoneal adenopathy. Abdominal aorta is normal in caliber. Moderate to dense atherosclerosis without aneurysm.  Reproductive: Prostate gland is enlarged measuring 5.4 cm transverse dimension.  Bladder: Minimally distended. No wall thickening.  Other: No free air, free fluid, or intra-abdominal fluid collection. Post left inguinal hernia repair with mesh. There is fat in the right inguinal canal.  Musculoskeletal: There are no acute or suspicious osseous abnormalities. Stable  degenerative change in the spine. Stable mild compression deformity superior endplate of L1.  IMPRESSION: 1. Bilateral obstructive uropathy with moderate bilateral hydroureteronephrosis. Large mid ureteral obstructing stones, 11 mm on the right and 9 mm on the left. Recommend urologic consultation. 2. Additional nonobstructing stones in both kidneys.  Assessment & Plan:   1. Bilateral obstructing nephrolithiasis next -We'll take the patient to the OR for acute placement of bilateral ureteral stents via cystoscopy. I discussed the risks, benefits, indications, and alternatives to the procedure with the patient and his family. He is aware this emergent procedure to relieve both obstruction causing sepsis and renal failure. He is agreeable to the procedure.  2. Acute renal injury -As above  3. Sepsis -Broad-spectrum antibiotic pending culture and sensitivity results -f/u culture results   Hildred LaserBrian Vipul Jacorey Donaway, MD  [facility] [address], [address] [place], [place] [address] [phone]

## 2014-12-10 NOTE — Anesthesia Procedure Notes (Signed)
Procedure Name: LMA Insertion Date/Time: 12/10/2014 1:31 AM Performed by: Shirlee LimerickMARION, Sevin Langenbach Pre-anesthesia Checklist: Patient identified, Emergency Drugs available, Suction available and Patient being monitored Patient Re-evaluated:Patient Re-evaluated prior to inductionOxygen Delivery Method: Circle system utilized Preoxygenation: Pre-oxygenation with 100% oxygen Intubation Type: IV induction LMA: LMA inserted LMA Size: 4.5 Number of attempts: 1 Tube secured with: Tape Dental Injury: Teeth and Oropharynx as per pre-operative assessment

## 2014-12-10 NOTE — Transfer of Care (Signed)
Immediate Anesthesia Transfer of Care Note  Patient: William PigeonJames B Creely Sr.  Procedure(s) Performed: Procedure(s): CYSTOSCOPY WITH STENT PLACEMENT (Bilateral) CYSTOSCOPY WITH RETROGRADE PYELOGRAM, URETEROSCOPY AND STENT PLACEMENT (Bilateral)  Patient Location: PACU  Anesthesia Type:General  Level of Consciousness: awake and patient cooperative  Airway & Oxygen Therapy: Patient Spontanous Breathing and Patient connected to nasal cannula oxygen  Post-op Assessment: Report given to RN and Post -op Vital signs reviewed and stable  Post vital signs: Reviewed and stable  Last Vitals:  Filed Vitals:   12/10/14 0229  BP: 90/67  Pulse: 128  Temp: 36.2 C  Resp: 23    Complications: No apparent anesthesia complications

## 2014-12-10 NOTE — Consult Note (Signed)
[facility]  Patient ID: William RegulusJames B Shrader Sr., MRN: [id number], DOB/AGE: 79/02/1932 79 y.o. Admit date: 12/09/2014   Date of Consult: 12/10/2014 Primary Physician: Marguarite ArbourSPARKS,JEFFREY D, MD Primary Cardiologist: Jennette [facility]  Chief Complaint:  Chief Complaint  Patient presents with  . Abdominal Pain   Reason for Consult: atrial fibrillation  HPI: 81 y.o. male with known chronic nonvalvular atrial fibrillation essential hypertension diabetes with complications who is had kidney stones requiring surgical intervention. With this surgical intervention the patient also had infection for which there was significant stress causing rapid ventricular rate of atrial fibrillation. There is no evidence of the changes in symptoms or suggestion of congestive heart failure and/or anginal equivalent at the time. The patient was placed on appropriate medication management including metoprolol and diltiazem combination intravenously which helped control heart rate to a great degree. The patient now has been replaced on his previous dose of metoprolol with better heart rate control since improvements of infection and recovery from surgery. Patient has been continued on anticoagulation for further risk reduction in stroke with atrial fibrillation without any bleeding complications at this time. The patient has had stable diabetes hypertension and hyperlipidemia on appropriate medication management  Past Medical History  Diagnosis Date  . Essential hypertension   . Atrial fibrillation (HCC)   . Kidney stones   . Hyperlipemia       Surgical History:  Past Surgical History  Procedure Laterality Date  . Hernia repair Bilateral     inguinal  . Cystoscopy with stent placement Bilateral 12/10/2014    Procedure: CYSTOSCOPY WITH STENT PLACEMENT;  Surgeon: Hildred LaserBrian Cane Budzyn, MD;  Location: [facility] ORS;  Service: Urology;  Laterality: Bilateral;  . Cystoscopy with retrograde pyelogram,  ureteroscopy and stent placement Bilateral 12/10/2014    Procedure: CYSTOSCOPY WITH RETROGRADE PYELOGRAM, URETEROSCOPY AND STENT PLACEMENT;  Surgeon: Hildred LaserBrian Gared Budzyn, MD;  Location: [facility] ORS;  Service: Urology;  Laterality: Bilateral;     Home Meds: Prior to Admission medications   Medication Sig Start Date End Date Taking? Authorizing Provider  apixaban (ELIQUIS) 5 MG TABS tablet Take 5 mg by mouth 2 (two) times daily.   Yes Historical Provider, MD  ciprofloxacin (CIPRO) 250 MG tablet Take 250 mg by mouth 2 (two) times daily.   Yes Historical Provider, MD  hydrochlorothiazide (HYDRODIURIL) 25 MG tablet Take 25 mg by mouth daily.   Yes Historical Provider, MD  HYDROcodone-acetaminophen (NORCO/VICODIN) 5-325 MG tablet Take 1 tablet by mouth every 4 (four) hours as needed for moderate pain.   Yes Historical Provider, MD  metoprolol succinate (TOPROL-XL) 25 MG 24 hr tablet Take 25 mg by mouth daily.   Yes Historical Provider, MD  ondansetron (ZOFRAN-ODT) 4 MG disintegrating tablet Take 4 mg by mouth every 8 (eight) hours as needed for nausea or vomiting.   Yes Historical Provider, MD  pantoprazole (PROTONIX) 40 MG tablet Take 40 mg by mouth daily.   Yes Historical Provider, MD  promethazine (PHENERGAN) 12.5 MG tablet Take 12.5 mg by mouth every 6 (six) hours as needed for nausea or vomiting.   Yes Historical Provider, MD  simvastatin (ZOCOR) 20 MG tablet Take 20 mg by mouth daily.   Yes Historical Provider, MD    Inpatient Medications:  . apixaban  5 mg Oral BID  . diltiazem      . diltiazem      . fentaNYL      . metoprolol succinate  25 mg Oral Daily  . pantoprazole  40 mg Oral Daily  . piperacillin-tazobactam (ZOSYN)  IV  3.375 g Intravenous Q12H  . [START ON 12/11/2014] pneumococcal 23 valent vaccine  0.5 mL Intramuscular Tomorrow-1000  . simvastatin  20 mg Oral Daily  . sodium chloride  3 mL Intravenous Q12H  . [START ON 12/11/2014] vancomycin  750 mg Intravenous Q36H   . sodium  chloride 125 mL/hr at 12/10/14 1008    Allergies: No Known Allergies  Social History   Social History  . Marital Status: Married    Spouse Name: N/A  . Number of Children: N/A  . Years of Education: N/A   Occupational History  . Not on file.   Social History Main Topics  . Smoking status: Never Smoker   . Smokeless tobacco: Not on file  . Alcohol Use: No  . Drug Use: Not on file  . Sexual Activity: Not on file   Other Topics Concern  . Not on file   Social History Narrative     Family History  Problem Relation Age of Onset  . Hypertension       Review of Systems Positive for abdominal discomfort Negative for: General:  chills, fever, night sweats or weight changes.  Cardiovascular: PND orthopnea syncope dizziness  Dermatological skin lesions rashes Respiratory: Cough congestion Urologic: Positive for hematuria Abdominal: negative for nausea, vomiting, diarrhea, bright red blood per rectum, melena, or hematemesis Neurologic: negative for visual changes, and/or hearing changes  All other systems reviewed and are otherwise negative except as noted above.  Labs:  Recent Labs  12/09/14 2255  TROPONINI <0.03   Lab Results  Component Value Date   WBC 22.8* 12/09/2014   HGB 15.0 12/09/2014   HCT 45.6 12/09/2014   MCV 91.6 12/09/2014   PLT 321 12/09/2014    Recent Labs Lab 12/09/14 2255  NA 138  K 4.5  CL 99*  CO2 23  BUN 47*  CREATININE 3.23*  CALCIUM 9.7  PROT 7.6  BILITOT 1.8*  ALKPHOS 82  ALT 18  AST 25  GLUCOSE 184*   No results found for: CHOL, HDL, LDLCALC, TRIG No results found for: DDIMER  Radiology/Studies:  US Renal  12/10/2014  CLINICAL DATA:  Hydronephrosis and bilateral calculi. Bilateral kidney stones removed last night. Gross hematuria since yesterday. EXAM: RENAL / URINARY TRACT ULTRASOUND COMPLETE COMPARISON:  CT of the abdomen and pelvis on 12/09/2014 FINDINGS: Right Kidney: Length: 10.8 cm. Multiple small cysts are  present. Largest is 1.2 x 1.1 x 1.2 cm. Small intrarenal stones are identified, too small to measure. Left Kidney: Length: 12.3 cm. Mild hydronephrosis. Small cysts are identified. Largest in the lower pole region measures 2.7 x 2.7 x 2.2 cm. Small intrarenal stones are noted, too small to measure. Bladder: Foley catheter decompresses the bladder. IMPRESSION: 1. Mild left hydronephrosis. 2. Bilateral cysts. 3. Small intrarenal calculi. Electronically Signed   By: Norva Pavlov M.D.   On: 12/10/2014 11:36   Ct Renal Stone Study  12/09/2014  CLINICAL DATA:  Back and abdominal pain for 1 week. Worsening tonight with vomiting and nausea. EXAM: CT ABDOMEN AND PELVIS WITHOUT CONTRAST TECHNIQUE: Multidetector CT imaging of the abdomen and pelvis was performed following the standard protocol without IV contrast. COMPARISON:  CT 07/25/2013 FINDINGS: Lower chest: Linear atelectasis in the left lower lobe. The heart is mildly enlarged. Liver: No evidence focal lesion allowing for lack contrast. Hepatobiliary: Gallbladder physiologically distended, no calcified stone. No evidence of biliary dilatation. Pancreas: No ductal dilatation or surrounding inflammation. Spleen:  Normal. Adrenal glands: No nodule.  Mild fullness bilaterally. Kidneys: There is moderate bilateral hydroureteronephrosis and perinephric strandy. On the right this secondary to a 11 x 9 mm stone in the right mid ureter at the level of L3-L4. On the left this is secondary to 9 x 8 mm stone in the left mid distal ureter at the level of L5-S1. Additional nonobstructing stones in both kidneys. There is a cyst in the lower left kidney, additional small bilateral renal cysts and not well seen. Stomach/Bowel: Stomach mildly distended with ingested contents. Small hiatal hernia. There are no dilated or thickened small bowel loops. Small volume of stool throughout the colon without colonic wall thickening. The colon is tortuous. The appendix is normal.  Vascular/Lymphatic: No retroperitoneal adenopathy. Abdominal aorta is normal in caliber. Moderate to dense atherosclerosis without aneurysm. Reproductive: Prostate gland is enlarged measuring 5.4 cm transverse dimension. Bladder: Minimally distended.  No wall thickening. Other: No free air, free fluid, or intra-abdominal fluid collection. Post left inguinal hernia repair with mesh. There is fat in the right inguinal canal. Musculoskeletal: There are no acute or suspicious osseous abnormalities. Stable degenerative change in the spine. Stable mild compression deformity superior endplate of L1. IMPRESSION: 1. Bilateral obstructive uropathy with moderate bilateral hydroureteronephrosis. Large mid ureteral obstructing stones, 11 mm on the right and 9 mm on the left. Recommend urologic consultation. 2. Additional nonobstructing stones in both kidneys. Electronically Signed   By: Rubye Oaks M.D.   On: 12/09/2014 23:26    EKG: Atrial fibrillation with nonspecific ST and T-wave changes  Weights: Filed Weights   12/09/14 2133 12/10/14 0639  Weight: 148 lb (67.132 kg) 155 lb 4.8 oz (70.444 kg)     Physical Exam: Blood pressure 93/65, pulse 75, temperature 98.7 F (37.1 C), temperature source Oral, resp. rate 17, height  (1.651 m), weight 155 lb 4.8 oz (70.444 kg), SpO2 96 %. Body mass index is 25.84 kg/(m^2). General: Well developed, well nourished, in no acute distress. Head eyes ears nose throat: Normocephalic, atraumatic, sclera non-icteric, no xanthomas, nares are without discharge. No apparent thyromegaly and/or mass  Lungs: Normal respiratory effort.  no wheezes, no rales, no rhonchi.  Heart: Irregular with normal S1 S2. 2+ mitral murmur gallop, no rub, PMI is normal size and placement, carotid upstroke normal without bruit, jugular venous pressure is normal Abdomen: Soft, non-tender, non-distended with normoactive bowel sounds. No hepatomegaly. No rebound/guarding. No obvious abdominal  masses. Abdominal aorta is normal size without bruit Extremities: Trace edema. no cyanosis, no clubbing, no ulcers  Peripheral : 2+ bilateral upper extremity pulses, 2+ bilateral femoral pulses, 2+ bilateral dorsal pedal pulse Neuro: Alert and oriented. No facial asymmetry. No focal deficit. Moves all extremities spontaneously. Musculoskeletal: Normal muscle tone without kyphosis Psych:  Responds to questions appropriately with a normal affect.    Assessment: 81 year old male with chronic nonvalvular trivial fibrillation with rapid ventricular rate secondary to pain and infection with the procedure now more controlled with previous dose of metoprolol not requiring further intervention at this time  Plan: 1. Continue metoprolol for heart rate control of chronic nonvalvular atrial fibrillation 2. Ambulation and follow for need in adjustments of dosage of medication management for heart rate control 3. Continue anticoagulation for further risk reduction in stroke with atrial fibrillation 4. High intensity cholesterol therapy with simvastatin 5. No restrictions to further interventions at this time if necessary for kidney stones 6. No further cardiac diagnostics necessary at this time 7. Okay for discharge home from  cardiac standpoint with follow-up next week for adjustments of medications  Signed, Lamar Blinks M.D. [facility] 12/10/2014, 1:08 PM

## 2014-12-10 NOTE — Progress Notes (Signed)
Patient alert and oriented x4. Oriented to room, unit, and call bell. Just received report from Yasmin, Charity fundraiserN. No complaints at this time. Will cont to assess. Skin assessment verified by Yasmin, RN. Telemetry box verified. Trudee KusterBrandi R Mansfield

## 2014-12-10 NOTE — H&P (Signed)
William CHITTUM Sr. is an 81 y.o. male.   Chief Complaint: Back/flank pain HPI: The patient presents emergency department with worsening back pain that radiates to his right flank. The patient has been having similar symptoms for approximately one week. He has seen in urgent care provider twice for this complaint. His last visit was today at which time possible gastroenteritis or food poisoning was suspected as the cause of his colicky pain and nausea. He denies vomiting however he admits to seeing blood in his urine today. He also denies chest pain or shortness of breath. In the emergency Department laboratory evaluation revealed significant leukocytosis. CT of the abdomen showed bilateral obstructive uropathy with large mid ureteral stones which brought to the emergency department staff to call for admission.  Past Medical History  Diagnosis Date  . Essential hypertension   . Atrial fibrillation ([place])   . Kidney stones   . Hyperlipemia     Past Surgical History  Procedure Laterality Date  . Hernia repair Bilateral     inguinal    Family History  Problem Relation Age of Onset  . Hypertension     Social History:  reports that he has never smoked. He does not have any smokeless tobacco history on file. He reports that he does not drink alcohol. His drug history is not on file.  Allergies: No Known Allergies  Prior to Admission medications   Medication Sig Start Date End Date Taking? Authorizing Provider  apixaban (ELIQUIS) 5 MG TABS tablet Take 5 mg by mouth 2 (two) times daily.   Yes Historical Provider, MD  ciprofloxacin (CIPRO) 250 MG tablet Take 250 mg by mouth 2 (two) times daily.   Yes Historical Provider, MD  hydrochlorothiazide (HYDRODIURIL) 25 MG tablet Take 25 mg by mouth daily.   Yes Historical Provider, MD  HYDROcodone-acetaminophen (NORCO/VICODIN) 5-325 MG tablet Take 1 tablet by mouth every 4 (four) hours as needed for moderate pain.   Yes Historical Provider, MD   metoprolol succinate (TOPROL-XL) 25 MG 24 hr tablet Take 25 mg by mouth daily.   Yes Historical Provider, MD  ondansetron (ZOFRAN-ODT) 4 MG disintegrating tablet Take 4 mg by mouth every 8 (eight) hours as needed for nausea or vomiting.   Yes Historical Provider, MD  pantoprazole (PROTONIX) 40 MG tablet Take 40 mg by mouth daily.   Yes Historical Provider, MD  promethazine (PHENERGAN) 12.5 MG tablet Take 12.5 mg by mouth every 6 (six) hours as needed for nausea or vomiting.   Yes Historical Provider, MD  simvastatin (ZOCOR) 20 MG tablet Take 20 mg by mouth daily.   Yes Historical Provider, MD     Results for orders placed or performed during the hospital encounter of 12/09/14 (from the past 48 hour(s))  CBC WITH DIFFERENTIAL     Status: Abnormal   Collection Time: 12/09/14  9:44 PM  Result Value Ref Range   WBC 22.8 (H) 3.8 - 10.6 K/uL   RBC 4.98 4.40 - 5.90 MIL/uL   Hemoglobin 15.0 13.0 - 18.0 g/dL   HCT 45.6 40.0 - 52.0 %   MCV 91.6 80.0 - 100.0 fL   MCH 30.1 26.0 - 34.0 pg   MCHC 32.8 32.0 - 36.0 g/dL   RDW 13.6 11.5 - 14.5 %   Platelets 321 150 - 440 K/uL   Neutrophils Relative % 92 %   Neutro Abs 20.9 (H) 1.4 - 6.5 K/uL   Lymphocytes Relative 2 %   Lymphs Abs 0.5 (L) 1.0 -  3.6 K/uL   Monocytes Relative 6 %   Monocytes Absolute 1.2 (H) 0.2 - 1.0 K/uL   Eosinophils Relative 0 %   Eosinophils Absolute 0.0 0 - 0.7 K/uL   Basophils Relative 0 %   Basophils Absolute 0.1 0 - 0.1 K/uL  Urinalysis complete, with microscopic ([facility] only)     Status: Abnormal   Collection Time: 12/09/14  9:44 PM  Result Value Ref Range   Color, Urine YELLOW (A) YELLOW   APPearance CLOUDY (A) CLEAR   Glucose, UA NEGATIVE NEGATIVE mg/dL   Bilirubin Urine NEGATIVE NEGATIVE   Ketones, ur NEGATIVE NEGATIVE mg/dL   Specific Gravity, Urine 1.018 1.005 - 1.030   Hgb urine dipstick 3+ (A) NEGATIVE   pH 5.0 5.0 - 8.0   Protein, ur NEGATIVE NEGATIVE mg/dL   Nitrite NEGATIVE NEGATIVE   Leukocytes, UA 1+  (A) NEGATIVE   RBC / HPF TOO NUMEROUS TO COUNT 0 - 5 RBC/hpf   WBC, UA 6-30 0 - 5 WBC/hpf   Bacteria, UA NONE SEEN NONE SEEN   Squamous Epithelial / LPF 0-5 (A) NONE SEEN   Mucous PRESENT    Hyaline Casts, UA PRESENT   Troponin I     Status: None   Collection Time: 12/09/14 10:55 PM  Result Value Ref Range   Troponin I <0.03 <0.031 ng/mL    Comment:        NO INDICATION OF MYOCARDIAL INJURY.   Comprehensive metabolic panel     Status: Abnormal   Collection Time: 12/09/14 10:55 PM  Result Value Ref Range   Sodium 138 135 - 145 mmol/L   Potassium 4.5 3.5 - 5.1 mmol/L   Chloride 99 (L) 101 - 111 mmol/L   CO2 23 22 - 32 mmol/L   Glucose, Bld 184 (H) 65 - 99 mg/dL   BUN 47 (H) 6 - 20 mg/dL   Creatinine, Ser 3.23 (H) 0.61 - 1.24 mg/dL   Calcium 9.7 8.9 - 10.3 mg/dL   Total Protein 7.6 6.5 - 8.1 g/dL   Albumin 4.1 3.5 - 5.0 g/dL   AST 25 15 - 41 U/L   ALT 18 17 - 63 U/L   Alkaline Phosphatase 82 38 - 126 U/L   Total Bilirubin 1.8 (H) 0.3 - 1.2 mg/dL   GFR calc non Af Amer 17 (L) >60 mL/min   GFR calc Af Amer 19 (L) >60 mL/min    Comment: (NOTE) The eGFR has been calculated using the CKD EPI equation. This calculation has not been validated in all clinical situations. eGFR's persistently <60 mL/min signify possible Chronic Kidney Disease.    Anion gap 16 (H) 5 - 15  Lactic acid, plasma     Status: Abnormal   Collection Time: 12/09/14 10:55 PM  Result Value Ref Range   Lactic Acid, Venous 2.9 (HH) 0.5 - 2.0 mmol/L    Comment: CRITICAL RESULT CALLED TO, READ BACK BY AND VERIFIED WITH BUTCH WOODS @2345  12/09/14.Marland KitchenMarland KitchenAJO   Blood gas, venous     Status: Abnormal   Collection Time: 12/09/14 10:55 PM  Result Value Ref Range   FIO2 0.21     Comment: ROOM AIR   pH, Ven 7.52 (H) 7.320 - 7.430   pCO2, Ven 28 (L) 44.0 - 60.0 mmHg   Bicarbonate 22.9 21.0 - 28.0 mEq/L   Acid-Base Excess 1.1 0.0 - 3.0 mmol/L   Patient temperature 37.0    Collection site VENOUS    Sample type VENOUS    Protime-INR  Status: Abnormal   Collection Time: 12/09/14 10:55 PM  Result Value Ref Range   Prothrombin Time 20.0 (H) 11.4 - 15.0 seconds   INR 1.68   APTT     Status: Abnormal   Collection Time: 12/09/14 10:55 PM  Result Value Ref Range   aPTT 37 (H) 24 - 36 seconds    Comment:        IF BASELINE aPTT IS ELEVATED, SUGGEST PATIENT RISK ASSESSMENT BE USED TO DETERMINE APPROPRIATE ANTICOAGULANT THERAPY.    Ct Renal Stone Study  12/09/2014  CLINICAL DATA:  Back and abdominal pain for 1 week. Worsening tonight with vomiting and nausea. EXAM: CT ABDOMEN AND PELVIS WITHOUT CONTRAST TECHNIQUE: Multidetector CT imaging of the abdomen and pelvis was performed following the standard protocol without IV contrast. COMPARISON:  CT 07/25/2013 FINDINGS: Lower chest: Linear atelectasis in the left lower lobe. The heart is mildly enlarged. Liver: No evidence focal lesion allowing for lack contrast. Hepatobiliary: Gallbladder physiologically distended, no calcified stone. No evidence of biliary dilatation. Pancreas: No ductal dilatation or surrounding inflammation. Spleen: Normal. Adrenal glands: No nodule.  Mild fullness bilaterally. Kidneys: There is moderate bilateral hydroureteronephrosis and perinephric strandy. On the right this secondary to a 11 x 9 mm stone in the right mid ureter at the level of L3-L4. On the left this is secondary to 9 x 8 mm stone in the left mid distal ureter at the level of L5-S1. Additional nonobstructing stones in both kidneys. There is a cyst in the lower left kidney, additional small bilateral renal cysts and not well seen. Stomach/Bowel: Stomach mildly distended with ingested contents. Small hiatal hernia. There are no dilated or thickened small bowel loops. Small volume of stool throughout the colon without colonic wall thickening. The colon is tortuous. The appendix is normal. Vascular/Lymphatic: No retroperitoneal adenopathy. Abdominal aorta is normal in caliber. Moderate  to dense atherosclerosis without aneurysm. Reproductive: Prostate gland is enlarged measuring 5.4 cm transverse dimension. Bladder: Minimally distended.  No wall thickening. Other: No free air, free fluid, or intra-abdominal fluid collection. Post left inguinal hernia repair with mesh. There is fat in the right inguinal canal. Musculoskeletal: There are no acute or suspicious osseous abnormalities. Stable degenerative change in the spine. Stable mild compression deformity superior endplate of L1. IMPRESSION: 1. Bilateral obstructive uropathy with moderate bilateral hydroureteronephrosis. Large mid ureteral obstructing stones, 11 mm on the right and 9 mm on the left. Recommend urologic consultation. 2. Additional nonobstructing stones in both kidneys. Electronically Signed   By: Jeb Levering M.D.   On: 12/09/2014 23:26    Review of Systems  Constitutional: Negative for fever and chills.  HENT: Negative for sore throat and tinnitus.   Eyes: Negative for blurred vision and redness.  Respiratory: Negative for cough and shortness of breath.   Cardiovascular: Negative for chest pain, palpitations, orthopnea and PND.  Gastrointestinal: Positive for nausea. Negative for vomiting, abdominal pain and diarrhea.  Genitourinary: Negative for dysuria, urgency and frequency.  Musculoskeletal: Positive for back pain. Negative for myalgias and joint pain.  Skin: Negative for rash.       No lesions  Neurological: Negative for speech change, focal weakness and weakness.  Endo/Heme/Allergies: Does not bruise/bleed easily.       No temperature intolerance  Psychiatric/Behavioral: Negative for depression and suicidal ideas.    Blood pressure 111/76, pulse 84, temperature 97.9 F (36.6 C), temperature source Oral, resp. rate 16, height 5' 5"  (1.651 m), weight 67.132 kg (148 lb), SpO2 100 %.  Physical Exam  Constitutional: He appears well-developed and well-nourished. He appears distressed.  HENT:  Head:  Normocephalic and atraumatic.  Mouth/Throat: Oropharynx is clear and moist.  Eyes: Conjunctivae and EOM are normal. Pupils are equal, round, and reactive to light. No scleral icterus.  Neck: Normal range of motion. Neck supple. No JVD present. No tracheal deviation present. No thyromegaly present.  Cardiovascular: Normal heart sounds and normal pulses.  An irregularly irregular rhythm present. Tachycardia present.  Exam reveals no gallop and no friction rub.   No murmur heard. Respiratory: Effort normal and breath sounds normal. No respiratory distress.  GI: Soft. Bowel sounds are normal. He exhibits no distension. There is no tenderness. There is no rebound.  Lymphadenopathy:    He has no cervical adenopathy.     Assessment/Plan This is an 81 year old Caucasian male admitted for sepsis secondary to obstructive uropathy. 1. Sepsis: The patient's criteria via leukocytosis and tachycardia. Upon presentation the patient's blood pressure was within normal limits however he has had some periods of relative hypotension. The patient is fluid responsive and we will continue to aggressively hydrate him. Cultures have been obtained we have started broad spectrum antibiotics. Follow cultures for growth and adjust biotics per sensitivities. 2. Atrial fibrillation: At times with rapid ventricular rate. This is likely secondary to pain, sepsis and/or dehydration. Patient is usually on [place]. He has missed his dose tonight. We will resume anticoagulation after his procedure. Continue metoprolol postoperatively. 3. Hyperlipidemia: Continue statin therapy 4. Hypertension: Hold antihypertensive medications for now. Resume hydrochlorothiazide when pressure is stabilized. 5. DVT prophylaxis: SCD's; resume oral echo regulation after surgery 6.GI prophylaxis: Pantoprazole The patient is a full code. Time spent on admission orders and critical patient care approximately 35 minutes  Harrie Foreman 12/10/2014,  12:46 AM

## 2014-12-10 NOTE — Progress Notes (Signed)
Patient reports pain, nausea, vomiting has resolved. He feels much better. His mental status is significantly improved from last night  Filed Vitals:   12/10/14 0504 12/10/14 0608 12/10/14 0639 12/10/14 0727  BP:   98/51 97/51  Pulse: 79  79 86  Temp: 97.9 F (36.6 C) 97.7 F (36.5 C) 97.5 F (36.4 C)   TempSrc:   Oral   Resp:   22   Height:      Weight:   155 lb 4.8 oz (70.444 kg)   SpO2:   95% 96%   I/O last 3 completed shifts: In: 1900 [I.V.:1900] Out: 400 [Urine:400]    NAD Soft NT ND No CVA tenderness Foley - clear pink with some debris  CBC/BMP/cultures pending  POD 0 s/p cystoscopy, bilateral stent placement . With purulent discharge from the left ureteral stent. The left renal stent was difficult to place. -Continue broad-spectrum robotics pending culture and sensitivity results -continue Foley catheter until tomorrow -follow-up CBC/BMP -Monitor vitals -The patient will need definitive stone management as an outpatient after he is recovered

## 2014-12-10 NOTE — Care Management Important Message (Signed)
Important Message  Patient Details  Name: Sheilah PigeonJames B Alkins Sr. MRN: [id number] Date of Birth: 12/15/1932   Medicare Important Message Given:  Yes-second notification given    Olegario MessierKathy A Allmond 12/10/2014, 10:09 AM

## 2014-12-10 NOTE — Progress Notes (Signed)
Patient alert and oriented x4, no complaints at this time. BP stil in 90's at this time, Dr. Judithann SheenSparks aware. Pt is asymptomatic. Patient afib on telemetry. Will continue to assess. Trudee KusterBrandi R Mansfield

## 2014-12-10 NOTE — Anesthesia Postprocedure Evaluation (Signed)
  Anesthesia Post-op Note  Patient: William PigeonJames B Casasola Sr.  Procedure(s) Performed: Procedure(s): CYSTOSCOPY WITH STENT PLACEMENT (Bilateral) CYSTOSCOPY WITH RETROGRADE PYELOGRAM, URETEROSCOPY AND STENT PLACEMENT (Bilateral)  Anesthesia type:General  Patient location: PACU  Post pain: Pain level controlled  Post assessment: Post-op Vital signs reviewed, Patient's Cardiovascular Status Stable, Respiratory Function Stable, Patent Airway and No signs of Nausea or vomiting  Post vital signs: Reviewed and stable  Last Vitals:  Filed Vitals:   12/10/14 1249  BP: 93/65  Pulse: 75  Temp: 37.1 C  Resp: 17    Level of consciousness: awake, alert  and patient cooperative  Complications: No apparent anesthesia complications

## 2014-12-10 NOTE — Care Management Note (Signed)
Case Management Note  Patient Details  Name: William PigeonJames B Belitz Sr. MRN: [id number] Date of Birth: 11/21/1932  Subjective/Objective:       CM assessment due to age and medial diagnosis.  Patient presented with abdominal pain and found to have bilateral obstructing  urinary stone.  Required bilateral  ureteral stents .  Was not able to perform nephrostomy tube insertion as patient has been on eliquis.  Procedure was least invasive to relieve sx.  He does have hx of atrial fib.  Prior to this illness, patient independent  in all adls, driving, no issues accessing medical care or meds.  He has the support of his long time girlfriend William Lozano.  Discussed the possibility of discharge with home health nursing and patient and William Norfolkolly are in agreement if is needed.  No agency preference    Action/Plan:   Expected Discharge Date:                  Expected Discharge Plan:     In-House Referral:     Discharge planning Services     Post Acute Care Choice:    Choice offered to:     DME Arranged:    DME Agency:     HH Arranged:    HH Agency:     Status of Service:     Medicare Important Message Given:  Yes-second notification given Date Medicare IM Given:    Medicare IM give by:    Date Additional Medicare IM Given:    Additional Medicare Important Message give by:     If discussed at Long Length of Stay Meetings, dates discussed:    Additional Comments:  William Lozano, William Rohrman Lozano, William Lozano 12/10/2014, 5:50 PM

## 2014-12-11 ENCOUNTER — Telehealth: Payer: Self-pay

## 2014-12-11 DIAGNOSIS — A419 Sepsis, unspecified organism: Secondary | ICD-10-CM | POA: Diagnosis not present

## 2014-12-11 LAB — BASIC METABOLIC PANEL
ANION GAP: 4 — AB (ref 5–15)
BUN: 32 mg/dL — ABNORMAL HIGH (ref 6–20)
CALCIUM: 7.7 mg/dL — AB (ref 8.9–10.3)
CHLORIDE: 112 mmol/L — AB (ref 101–111)
CO2: 23 mmol/L (ref 22–32)
CREATININE: 1.72 mg/dL — AB (ref 0.61–1.24)
GFR calc non Af Amer: 36 mL/min — ABNORMAL LOW (ref >60)
GFR, EST AFRICAN AMERICAN: 41 mL/min — AB (ref >60)
Glucose, Bld: 113 mg/dL — ABNORMAL HIGH (ref 65–99)
Potassium: 3.5 mmol/L (ref 3.5–5.1)
SODIUM: 139 mmol/L (ref 135–145)

## 2014-12-11 LAB — CBC WITH DIFFERENTIAL/PLATELET
Basophils Absolute: 0.1 10*3/uL (ref 0–0.1)
Basophils Relative: 1 %
EOS ABS: 0.2 10*3/uL (ref 0–0.7)
Eosinophils Relative: 2 %
HEMATOCRIT: 36.5 % — AB (ref 40.0–52.0)
Hemoglobin: 12 g/dL — ABNORMAL LOW (ref 13.0–18.0)
LYMPHS ABS: 1.4 10*3/uL (ref 1.0–3.6)
Lymphocytes Relative: 11 %
MCH: 30 pg (ref 26.0–34.0)
MCHC: 32.9 g/dL (ref 32.0–36.0)
MCV: 91.3 fL (ref 80.0–100.0)
MONOS PCT: 9 %
Monocytes Absolute: 1.1 10*3/uL — ABNORMAL HIGH (ref 0.2–1.0)
NEUTROS ABS: 9.9 10*3/uL — AB (ref 1.4–6.5)
Neutrophils Relative %: 77 %
Platelets: 257 10*3/uL (ref 150–440)
RBC: 3.99 MIL/uL — ABNORMAL LOW (ref 4.40–5.90)
RDW: 13.9 % (ref 11.5–14.5)
WBC: 12.7 10*3/uL — AB (ref 3.8–10.6)

## 2014-12-11 LAB — MAGNESIUM: MAGNESIUM: 1.9 mg/dL (ref 1.7–2.4)

## 2014-12-11 MED ORDER — PIPERACILLIN-TAZOBACTAM 3.375 G IVPB
3.3750 g | Freq: Three times a day (TID) | INTRAVENOUS | Status: DC
  Administered 2014-12-11 – 2014-12-12 (×4): 3.375 g via INTRAVENOUS
  Filled 2014-12-11 (×6): qty 50

## 2014-12-11 NOTE — Progress Notes (Addendum)
Patient noted to have 3/4 beat run of vtach on night shift and this morning at 0707, patient is asymptomatic. Dr. Marcello FennelHande notifed and advised for cardiology to be paged. Dr. Joana ReamerKowolski notified, no new orders at this time, wants metoprolol to be given if patient BP is high enough and monitor HR. At this time bp stable and metoprolol given. No other episodes, will cont to monitor. Afib on telemetry, no complaints.  Trudee KusterBrandi R Mansfield

## 2014-12-11 NOTE — Care Management (Signed)
Afebrile and white count improved.  Foley cath discontinued this morning.  It is documented that patient has had some runs of Vtach caught on telemetry.  Flow sheet show heart rate over the last 24 hours 55 - 125.  A dose of metoprolol was held due to heart rate of 55.  Cardiology is following.  Discussed during progression to make sure patient is ambulating

## 2014-12-11 NOTE — Progress Notes (Signed)
[facility] Cardiology [facility] Encounter Note  Patient: William DAVIE Sr. / Admit Date: 12/09/2014 / Date of Encounter: 12/11/2014, 4:38 PM   Subjective: Patient is still having some significant back pain and urinary tract infection as well as kidney stone pain There has been some telemetry changes consistent with wide complex tachycardia  Review of Systems: Positive for: Back pain Negative for: Vision change, hearing change, syncope, dizziness, nausea, vomiting,diarrhea, bloody stool,  cough, congestion, diaphoresis, urinary frequency,  skin lesions, skin rashes Others previously listed  Objective: Telemetry: Atrial fibrillation with controlled ventricular rate with a few episodes of non-sustained wide-complex tachycardia Physical Exam: Blood pressure 124/75, pulse 113, temperature 98.2 F (36.8 C), temperature source Oral, resp. rate 17, height  (1.651 m), weight 119 lb 9.6 oz (54.25 kg), SpO2 99 %. Body mass index is 19.9 kg/(m^2). General: Well developed, well nourished, in no acute distress. Head: Normocephalic, atraumatic, sclera non-icteric, no xanthomas, nares are without discharge. Neck: No apparent masses Lungs: Normal respirations with no wheezes, no rhonchi, no rales , no crackles   Heart: Irregular rate and rhythm, normal S1 S2, no murmur, no rub, no gallop, PMI is normal size and placement, carotid upstroke normal without bruit, jugular venous pressure normal Abdomen: Soft,  tender, non-distended with normoactive bowel sounds. No hepatosplenomegaly. Abdominal aorta is normal size without bruit Extremities: No edema, no clubbing, no cyanosis, no ulcers,  Peripheral: 2+ radial, 2+ femoral, 2+ dorsal pedal pulses Neuro: Alert and oriented. Moves all extremities spontaneously. Psych:  Responds to questions appropriately with a normal affect.   Intake/Output Summary (Last 24 hours) at 12/11/14 1638 Last data filed at 12/11/14 1529  Gross per 24 hour  Intake  2547.92 ml  Output   2175 ml  Net 372.92 ml    Inpatient Medications:  . apixaban  5 mg Oral BID  . metoprolol succinate  25 mg Oral Daily  . pantoprazole  40 mg Oral Daily  . piperacillin-tazobactam (ZOSYN)  IV  3.375 g Intravenous 3 times per day  . simvastatin  20 mg Oral Daily  . sodium chloride  3 mL Intravenous Q12H  . vancomycin  750 mg Intravenous Q36H   Infusions:  . sodium chloride 125 mL/hr at 12/11/14 1508    Labs:  Recent Labs  12/09/14 2255 12/11/14 0457  NA 138 139  K 4.5 3.5  CL 99* 112*  CO2 23 23  GLUCOSE 184* 113*  BUN 47* 32*  CREATININE 3.23* 1.72*  CALCIUM 9.7 7.7*  MG  --  1.9    Recent Labs  12/09/14 2255  AST 25  ALT 18  ALKPHOS 82  BILITOT 1.8*  PROT 7.6  ALBUMIN 4.1    Recent Labs  12/09/14 2144 12/11/14 0457  WBC 22.8* 12.7*  NEUTROABS 20.9* 9.9*  HGB 15.0 12.0*  HCT 45.6 36.5*  MCV 91.6 91.3  PLT 321 257    Recent Labs  12/09/14 2255  TROPONINI <0.03   Invalid input(s): POCBNP No results for input(s): HGBA1C in the last 72 hours.   Weights: Filed Weights   12/09/14 2133 12/10/14 0639 12/11/14 0636  Weight: 148 lb (67.132 kg) 155 lb 4.8 oz (70.444 kg) 119 lb 9.6 oz (54.25 kg)     Radiology/Studies:  US Renal  12/10/2014  CLINICAL DATA:  Hydronephrosis and bilateral calculi. Bilateral kidney stones removed last night. Gross hematuria since yesterday. EXAM: RENAL / URINARY TRACT ULTRASOUND COMPLETE COMPARISON:  CT of the abdomen and pelvis on 12/09/2014 FINDINGS: Right Kidney:  Length: 10.8 cm. Multiple small cysts are present. Largest is 1.2 x 1.1 x 1.2 cm. Small intrarenal stones are identified, too small to measure. Left Kidney: Length: 12.3 cm. Mild hydronephrosis. Small cysts are identified. Largest in the lower pole region measures 2.7 x 2.7 x 2.2 cm. Small intrarenal stones are noted, too small to measure. Bladder: Foley catheter decompresses the bladder. IMPRESSION: 1. Mild left hydronephrosis. 2. Bilateral  cysts. 3. Small intrarenal calculi. Electronically Signed   By: Norva PavlovElizabeth  Brown M.D.   On: 12/10/2014 11:36   Ct Renal Stone Study  12/09/2014  CLINICAL DATA:  Back and abdominal pain for 1 week. Worsening tonight with vomiting and nausea. EXAM: CT ABDOMEN AND PELVIS WITHOUT CONTRAST TECHNIQUE: Multidetector CT imaging of the abdomen and pelvis was performed following the standard protocol without IV contrast. COMPARISON:  CT 07/25/2013 FINDINGS: Lower chest: Linear atelectasis in the left lower lobe. The heart is mildly enlarged. Liver: No evidence focal lesion allowing for lack contrast. Hepatobiliary: Gallbladder physiologically distended, no calcified stone. No evidence of biliary dilatation. Pancreas: No ductal dilatation or surrounding inflammation. Spleen: Normal. Adrenal glands: No nodule.  Mild fullness bilaterally. Kidneys: There is moderate bilateral hydroureteronephrosis and perinephric strandy. On the right this secondary to a 11 x 9 mm stone in the right mid ureter at the level of L3-L4. On the left this is secondary to 9 x 8 mm stone in the left mid distal ureter at the level of L5-S1. Additional nonobstructing stones in both kidneys. There is a cyst in the lower left kidney, additional small bilateral renal cysts and not well seen. Stomach/Bowel: Stomach mildly distended with ingested contents. Small hiatal hernia. There are no dilated or thickened small bowel loops. Small volume of stool throughout the colon without colonic wall thickening. The colon is tortuous. The appendix is normal. Vascular/Lymphatic: No retroperitoneal adenopathy. Abdominal aorta is normal in caliber. Moderate to dense atherosclerosis without aneurysm. Reproductive: Prostate gland is enlarged measuring 5.4 cm transverse dimension. Bladder: Minimally distended.  No wall thickening. Other: No free air, free fluid, or intra-abdominal fluid collection. Post left inguinal hernia repair with mesh. There is fat in the right  inguinal canal. Musculoskeletal: There are no acute or suspicious osseous abnormalities. Stable degenerative change in the spine. Stable mild compression deformity superior endplate of L1. IMPRESSION: 1. Bilateral obstructive uropathy with moderate bilateral hydroureteronephrosis. Large mid ureteral obstructing stones, 11 mm on the right and 9 mm on the left. Recommend urologic consultation. 2. Additional nonobstructing stones in both kidneys. Electronically Signed   By: Rubye OaksMelanie  Ehinger M.D.   On: 12/09/2014 23:26     Assessment and Recommendation  [age] y.o. male with chronic nonvalvular atrial fibrillation review sling on appropriate medication management with reasonable heart rate control at this time having significant pain from previous surgery likely causing variability of heart rate and short nonsustained wide-complex tachycardia 1. Continue metoprolol for wide-complex tachycardia as well as atrial fibrillation 2. Begin ambulation and follow for improvements of pain which is likely contributing to faster heart rate 3. Continue anticoagulation for further risk reduction in stroke with atrial fibrillation 4. Further adjustments of medications as necessary depending on telemetry changes overnight  Signed, Arnoldo HookerBruce Livier Hendel M.D. FACC

## 2014-12-11 NOTE — Telephone Encounter (Signed)
LMOM to notify pt of appt with Dr Sherryl BartersBudzyn on 11/4 @4 :15.

## 2014-12-11 NOTE — Progress Notes (Signed)
12/11/14 1030  Clinical Encounter Type  Visited With Patient and family together  Visit Type Follow-up  Consult/Referral To Chaplain  Spiritual Encounters  Spiritual Needs Emotional;Prayer  Stress Factors  Patient Stress Factors Health changes  Met w/patient & family. Pt. was active and in high spirits. He was engaged and appeared to have a strong appetite. Provided pastoral care.  Chap. Cleopatra Sardo G. Boaz

## 2014-12-11 NOTE — Progress Notes (Signed)
ANTIBIOTIC CONSULT NOTE - Follow Up  Pharmacy Consult for Vancyomycin/Zosyn dosing Indication: Sepsis/intra-abdominal infection  No Known Allergies  Patient Measurements: Height: 5\' 5"  (165.1 cm) Weight: 119 lb 9.6 oz (54.25 kg) IBW/kg (Calculated) : 61.5 Adjusted Body Weight: n/a  Vital Signs: Temp: 98.1 F (36.7 C) (10/21 0350) Temp Source: Oral (10/21 0350) BP: 126/79 mmHg (10/21 0835) Pulse Rate: 99 (10/21 0835) Intake/Output from previous day: 10/20 0701 - 10/21 0700 In: 1735 [P.O.:760; I.V.:875; IV Piggyback:100] Out: 2100 [Urine:2100] Intake/Output from this shift:    Labs:  Recent Labs  12/09/14 2144 12/09/14 2255 12/11/14 0457  WBC 22.8*  --  12.7*  HGB 15.0  --  12.0*  PLT 321  --  257  CREATININE  --  3.23* 1.72*   Estimated Creatinine Clearance: 26.3 mL/min (by C-G formula based on Cr of 1.72). No results for input(s): VANCOTROUGH, VANCOPEAK, VANCORANDOM, GENTTROUGH, GENTPEAK, GENTRANDOM, TOBRATROUGH, TOBRAPEAK, TOBRARND, AMIKACINPEAK, AMIKACINTROU, AMIKACIN in the last 72 hours.   Microbiology: Recent Results (from the past 720 hour(s))  Urine culture     Status: None (Preliminary result)   Collection Time: 12/09/14  9:44 PM  Result Value Ref Range Status   Specimen Description URINE, RANDOM  Final   Special Requests NONE  Final   Culture HOLDING FOR POSSIBLE PATHOGEN  Final   Report Status PENDING  Incomplete  Blood culture (routine x 2)     Status: None (Preliminary result)   Collection Time: 12/09/14 11:56 PM  Result Value Ref Range Status   Specimen Description BLOOD RIGHT ANTECUBITAL  Final   Special Requests BOTTLES DRAWN AEROBIC AND ANAEROBIC 15CC  Final   Culture NO GROWTH < 12 HOURS  Final   Report Status PENDING  Incomplete  Blood culture (routine x 2)     Status: None (Preliminary result)   Collection Time: 12/10/14 12:39 AM  Result Value Ref Range Status   Specimen Description BLOOD RIGHT ANTECUBITAL  Final   Special Requests  BOTTLES DRAWN AEROBIC AND ANAEROBIC 10ML  Final   Culture NO GROWTH < 12 HOURS  Final   Report Status PENDING  Incomplete  Urine culture     Status: None (Preliminary result)   Collection Time: 12/10/14  1:57 AM  Result Value Ref Range Status   Specimen Description ARM  Final   Special Requests NONE  Final   Culture NO GROWTH 1 DAY  Final   Report Status PENDING  Incomplete    Medical History: Past Medical History  Diagnosis Date  . Essential hypertension   . Atrial fibrillation (HCC)   . Kidney stones   . Hyperlipemia      Assessment: 81 yo male admitted for sepsis and possible intra-abdominal infection  Pharmacy consulted to dose vancomycin and zosyn. Currently ordered Zosyn 3.375 gm IV q12h and Vancomycin 750 mg IV q36h.   UCx: NGTD, BCx: NGTD   PK Parameters: SCr: 1.72, est CrCl~26.3 mL/min, ke: 0.026, t1/2: 26.7 h, Vd: 38 L  Goal of Therapy:  Resolution of infection Vancomycin trough goal of 15-20  Plan:  1. Will continue current dosing of Vancomycin 750 mg IV q36h. Renal function is improving.  Expected trough with above kinetics of ~15. Trough ordered prior to dose on 10/23 at 0830.  BMP ordered in AM to assess renal function for dosing/trough changes.   2. Zosyn 3.375 g IV q12h ordered. Est CrCl>20 mL/min.  Therefore, will transition patient to Zosyn 3.375 gm IV q8h per EI protocol.   Pharmacy will continue  to follow.   Devika Dragovich G 12/11/2014,11:05 AM

## 2014-12-11 NOTE — Telephone Encounter (Signed)
-----   Message from Hildred LaserBrian Takuya Budzyn, MD sent at 12/10/2014  9:04 AM EDT ----- Patient will need to f/u in 2 weeks to discuss definitive stone management.  He will be in the hospital for a few more days.

## 2014-12-11 NOTE — Progress Notes (Signed)
6 beat run VT, page out to Dr. Joana ReamerKowolski.

## 2014-12-11 NOTE — Progress Notes (Signed)
Dr. Joana ReamerKowolski called back, no new orders received.

## 2014-12-11 NOTE — Care Management (Signed)
Spoke with attending.  Anticipate discharge 10/22.  Informed attending if needed, patient agreeable to home health nursing and that have asked nursing to ambulate patient

## 2014-12-11 NOTE — Progress Notes (Signed)
Patient ID: William RegulusJames B Renn Sr., male   DOB: 06/18/1932, 81 y.o.   MRN: [id number] Patient seen today. His white count is down 12,700 creatinine down to 1.7 to these are significant decreases. He has good urine output urology plan today is to discharge the patient from our service he can be discharged tomorrow from medical service. We will order to DC of his Foley today. Patient has no outlet obstruction problems. Having no discomfort today is alert and oriented 3 abdomen soft. Negative Murphy's Lloyd's McBurney's today. Status successful bilateral stenting for after being in renal failure 36 hours ago. Ultrasound yesterday revealed a significantly decreased hydronephrosis bilaterally stents were in place. Impression is bilateral calculi with bilateral obstruction accompanied by fever chills and urinary tract infection on admission. Successful bilateral stenting. Plan is to do for shockwave lithotripsy as an outpatient on this patient DC as stents. Signed RD Edwyna ShellHart D.O. thanks

## 2014-12-11 NOTE — Progress Notes (Signed)
Patient completed trial void. William Lozano

## 2014-12-11 NOTE — Progress Notes (Signed)
PROGRESS NOTE  William Lozano. ZOX:[id number] DOB: 03/13/1932 DOA: 12/09/2014 PCP: Marguarite ArbourSPARKS,JEFFREY D, MD  Subjective   81 y/o m with hx of A-fib, HTN, Type 2 DM, Kidney stones admitted with Acute Kidney injury, Bilat Hydronephrosis and Hypotension secondary to sepsis. Underwent stent placement This am feels well. Had a couple of runs of V.tach over night Consultants:  [facility]  Urology- Dr. Sherryl BartersBudzyn   Objective: BP 100/63 mmHg  Pulse 94  Temp(Src) 98.1 F (36.7 C) (Oral)  Resp 16  Ht 5\' 5"  (1.651 m)  Wt 54.25 kg (119 lb 9.6 oz)  BMI 19.90 kg/m2  SpO2 93%  Intake/Output Summary (Last 24 hours) at 12/11/14 0814 Last data filed at 12/11/14 0636  Gross per 24 hour  Intake   1735 ml  Output   2100 ml  Net   -365 ml   Filed Weights   12/09/14 2133 12/10/14 0639 12/11/14 0636  Weight: 67.132 kg (148 lb) 70.444 kg (155 lb 4.8 oz) 54.25 kg (119 lb 9.6 oz)    Exam:  General: Well developed, well nourished male, in NAD HEENT: PERRL; OP moist without lesions. Neck: supple, trachea midline, no thyromegaly Chest: normal to palpation Lungs: clear bilaterally without retractions or wheezes Cardiovascular: RRR, no murmur, no gallop; distal pulses 2+ Abdomen: soft, nontender, nondistended, positive bowel sounds Extremities: no clubbing, cyanosis, edema Neuro: alert and oriented, moves all extremities Derm: no significant rashes or nodules; good skin turgor Lymph: no cervical or supraclavicular lymphadenopathy   Labs and imaging studies were reviewed*  Data Reviewed: Basic Metabolic Panel:  Recent Labs Lab 12/09/14 2255 12/11/14 0457  NA 138 139  K 4.5 3.5  CL 99* 112*  CO2 23 23  GLUCOSE 184* 113*  BUN 47* 32*  CREATININE 3.23* 1.72*  CALCIUM 9.7 7.7*   Liver Function Tests:  Recent Labs Lab 12/09/14 2255  AST 25  ALT 18  ALKPHOS 82  BILITOT 1.8*  PROT 7.6  ALBUMIN 4.1   No results for input(s): LIPASE, AMYLASE in the last 168  hours. No results for input(s): AMMONIA in the last 168 hours. CBC:  Recent Labs Lab 12/09/14 2144 12/11/14 0457  WBC 22.8* 12.7*  NEUTROABS 20.9* 9.9*  HGB 15.0 12.0*  HCT 45.6 36.5*  MCV 91.6 91.3  PLT 321 257   Cardiac Enzymes:    Recent Labs Lab 12/09/14 2255  TROPONINI <0.03   BNP (last 3 results) No results for input(s): BNP in the last 8760 hours.  ProBNP (last 3 results) No results for input(s): PROBNP in the last 8760 hours.  CBG: No results for input(s): GLUCAP in the last 168 hours.  Recent Results (from the past 240 hour(s))  Urine culture     Status: None (Preliminary result)   Collection Time: 12/09/14  9:44 PM  Result Value Ref Range Status   Specimen Description URINE, RANDOM  Final   Special Requests NONE  Final   Culture HOLDING FOR POSSIBLE PATHOGEN  Final   Report Status PENDING  Incomplete  Blood culture (routine x 2)     Status: None (Preliminary result)   Collection Time: 12/09/14 11:56 PM  Result Value Ref Range Status   Specimen Description BLOOD RIGHT ANTECUBITAL  Final   Special Requests BOTTLES DRAWN AEROBIC AND ANAEROBIC 15CC  Final   Culture NO GROWTH < 12 HOURS  Final   Report Status PENDING  Incomplete  Blood culture (routine x 2)     Status: None (Preliminary result)  Collection Time: 12/10/14 12:39 AM  Result Value Ref Range Status   Specimen Description BLOOD RIGHT ANTECUBITAL  Final   Special Requests BOTTLES DRAWN AEROBIC AND ANAEROBIC  Final   Culture NO GROWTH < 12 HOURS  Final   Report Status PENDING  Incomplete     Studies: US Renal  12/10/2014  CLINICAL DATA:  Hydronephrosis and bilateral calculi. Bilateral kidney stones removed last night. Gross hematuria since yesterday. EXAM: RENAL / URINARY TRACT ULTRASOUND COMPLETE COMPARISON:  CT of the abdomen and pelvis on 12/09/2014 FINDINGS: Right Kidney: Length: 10.8 cm. Multiple small cysts are present. Largest is 1.2 x 1.1 x 1.2 cm. Small intrarenal stones are  identified, too small to measure. Left Kidney: Length: 12.3 cm. Mild hydronephrosis. Small cysts are identified. Largest in the lower pole region measures 2.7 x 2.7 x 2.2 cm. Small intrarenal stones are noted, too small to measure. Bladder: Foley catheter decompresses the bladder. IMPRESSION: 1. Mild left hydronephrosis. 2. Bilateral cysts. 3. Small intrarenal calculi. Electronically Signed   By: Norva Pavlov M.D.   On: 12/10/2014 11:36    Scheduled Meds: . apixaban  5 mg Oral BID  . metoprolol succinate  25 mg Oral Daily  . pantoprazole  40 mg Oral Daily  . piperacillin-tazobactam (ZOSYN)  IV  3.375 g Intravenous 3 times per day  . pneumococcal 23 valent vaccine  0.5 mL Intramuscular Tomorrow-1000  . simvastatin  20 mg Oral Daily  . sodium chloride  3 mL Intravenous Q12H  . vancomycin  750 mg Intravenous Q36H    Continuous Infusions: . sodium chloride 125 mL/hr at 12/11/14 0757    Assessment/Plan:  1 Urosepsis:: Continue Iv Zosyn and Vanc 2 Acute Kidney injury with Hydronephrosis ; Better following stent placement Se Creat improved to 1.72 3 A-fib/ Episodes of V.tach;On Eliquis and Metoprolol Appreciated cardiology input. Check Se Mag  4 Hypotension: Continue IV Fluids 5 Physical Therapy   Code Status: Full code          12/11/2014, 8:14 AM  LOS: 1 day

## 2014-12-12 LAB — C DIFFICILE QUICK SCREEN W PCR REFLEX
C Diff antigen: NEGATIVE
C Diff interpretation: NEGATIVE
C Diff toxin: NEGATIVE

## 2014-12-12 LAB — BASIC METABOLIC PANEL
Anion gap: 5 (ref 5–15)
BUN: 24 mg/dL — AB (ref 6–20)
CO2: 23 mmol/L (ref 22–32)
CREATININE: 1.46 mg/dL — AB (ref 0.61–1.24)
Calcium: 8.1 mg/dL — ABNORMAL LOW (ref 8.9–10.3)
Chloride: 112 mmol/L — ABNORMAL HIGH (ref 101–111)
GFR calc Af Amer: 51 mL/min — ABNORMAL LOW (ref >60)
GFR calc non Af Amer: 44 mL/min — ABNORMAL LOW (ref >60)
GLUCOSE: 95 mg/dL (ref 65–99)
POTASSIUM: 3.7 mmol/L (ref 3.5–5.1)
Sodium: 140 mmol/L (ref 135–145)

## 2014-12-12 LAB — URINE CULTURE: CULTURE: NO GROWTH

## 2014-12-12 LAB — CBC WITH DIFFERENTIAL/PLATELET
Basophils Absolute: 0.1 10*3/uL (ref 0–0.1)
Basophils Relative: 1 %
EOS PCT: 4 %
Eosinophils Absolute: 0.4 10*3/uL (ref 0–0.7)
HCT: 37.3 % — ABNORMAL LOW (ref 40.0–52.0)
HEMOGLOBIN: 12.6 g/dL — AB (ref 13.0–18.0)
LYMPHS ABS: 1.2 10*3/uL (ref 1.0–3.6)
LYMPHS PCT: 11 %
MCH: 31.1 pg (ref 26.0–34.0)
MCHC: 33.7 g/dL (ref 32.0–36.0)
MCV: 92.3 fL (ref 80.0–100.0)
MONOS PCT: 9 %
Monocytes Absolute: 1 10*3/uL (ref 0.2–1.0)
Neutro Abs: 8.3 10*3/uL — ABNORMAL HIGH (ref 1.4–6.5)
Neutrophils Relative %: 75 %
Platelets: 256 10*3/uL (ref 150–440)
RBC: 4.04 MIL/uL — AB (ref 4.40–5.90)
RDW: 13.6 % (ref 11.5–14.5)
WBC: 10.9 10*3/uL — AB (ref 3.8–10.6)

## 2014-12-12 MED ORDER — VANCOMYCIN HCL IN DEXTROSE 750-5 MG/150ML-% IV SOLN
750.0000 mg | INTRAVENOUS | Status: DC
  Administered 2014-12-12: 750 mg via INTRAVENOUS
  Filled 2014-12-12: qty 150

## 2014-12-12 MED ORDER — DEXTROSE 5 % IV SOLN
1.0000 g | INTRAVENOUS | Status: DC
  Administered 2014-12-12 – 2014-12-13 (×2): 1 g via INTRAVENOUS
  Filled 2014-12-12 (×2): qty 10

## 2014-12-12 NOTE — Progress Notes (Signed)
PROGRESS NOTE  William RegulusJames B Statzer Sr. [id number] DOB: 03/30/1932 DOA: 12/09/2014 PCP: Marguarite ArbourSPARKS,JEFFREY D, MD  Subjective   81 y/o m with hx of A-fib, HTN, Type 2 DM, Kidney stones admitted with Acute Kidney injury, Bilat Hydronephrosis and Hypotension secondary to sepsis. Underwent stent placement This am c/o diarrhea with green stools Consultants:  [facility]  Urology- Dr. Sherryl BartersBudzyn   Objective: BP 127/89 mmHg  Pulse 83  Temp(Src) 98.3 F (36.8 C) (Oral)  Resp 17  Ht 5\' 5"  (1.651 m)  Wt 52.98 kg (116 lb 12.8 oz)  BMI 19.44 kg/m2  SpO2 96%  Intake/Output Summary (Last 24 hours) at 12/12/14 1105 Last data filed at 12/12/14 0830  Gross per 24 hour  Intake 2278.75 ml  Output   1925 ml  Net 353.75 ml   Filed Weights   12/10/14 0639 12/11/14 0636 12/12/14 0550  Weight: 70.444 kg (155 lb 4.8 oz) 54.25 kg (119 lb 9.6 oz) 52.98 kg (116 lb 12.8 oz)    Exam:  General: Well developed, well nourished male, in NAD HEENT: PERRL; OP moist without lesions. Neck: supple, trachea midline, no thyromegaly Chest: normal to palpation Lungs: clear bilaterally without retractions or wheezes Cardiovascular: RRR, no murmur, no gallop; distal pulses 2+ Abdomen: soft, nontender, nondistended, positive bowel sounds Extremities: no clubbing, cyanosis, edema Neuro: alert and oriented, moves all extremities Derm: no significant rashes or nodules; good skin turgor Lymph: no cervical or supraclavicular lymphadenopathy   Labs and imaging studies were reviewed*  Data Reviewed: Basic Metabolic Panel:  Recent Labs Lab 12/09/14 2255 12/11/14 0457 12/12/14 0545  NA 138 139 140  K 4.5 3.5 3.7  CL 99* 112* 112*  CO2 23 23 23   GLUCOSE 184* 113* 95  BUN 47* 32* 24*  CREATININE 3.23* 1.72* 1.46*  CALCIUM 9.7 7.7* 8.1*  MG  --  1.9  --    Liver Function Tests:  Recent Labs Lab 12/09/14 2255  AST 25  ALT 18  ALKPHOS 82  BILITOT 1.8*  PROT 7.6  ALBUMIN 4.1   No results  for input(s): LIPASE, AMYLASE in the last 168 hours. No results for input(s): AMMONIA in the last 168 hours. CBC:  Recent Labs Lab 12/09/14 2144 12/11/14 0457 12/12/14 0545  WBC 22.8* 12.7* 10.9*  NEUTROABS 20.9* 9.9* 8.3*  HGB 15.0 12.0* 12.6*  HCT 45.6 36.5* 37.3*  MCV 91.6 91.3 92.3  PLT 321 257 256   Cardiac Enzymes:    Recent Labs Lab 12/09/14 2255  TROPONINI <0.03   BNP (last 3 results) No results for input(s): BNP in the last 8760 hours.  ProBNP (last 3 results) No results for input(s): PROBNP in the last 8760 hours.  CBG: No results for input(s): GLUCAP in the last 168 hours.  Recent Results (from the past 240 hour(s))  Urine culture     Status: None   Collection Time: 12/09/14  9:44 PM  Result Value Ref Range Status   Specimen Description URINE, RANDOM  Final   Special Requests NONE  Final   Culture MULTIPLE SPECIES PRESENT, SUGGEST RECOLLECTION  Final   Report Status 12/12/2014 FINAL  Final  Blood culture (routine x 2)     Status: None (Preliminary result)   Collection Time: 12/09/14 11:56 PM  Result Value Ref Range Status   Specimen Description BLOOD RIGHT ANTECUBITAL  Final   Special Requests BOTTLES DRAWN AEROBIC AND ANAEROBIC 15CC  Final   Culture NO GROWTH 2 DAYS  Final   Report Status PENDING  Incomplete  Blood culture (routine x 2)     Status: None (Preliminary result)   Collection Time: 12/10/14 12:39 AM  Result Value Ref Range Status   Specimen Description BLOOD RIGHT ANTECUBITAL  Final   Special Requests BOTTLES DRAWN AEROBIC AND ANAEROBIC  Final   Culture NO GROWTH 2 DAYS  Final   Report Status PENDING  Incomplete  Urine culture     Status: None   Collection Time: 12/10/14  1:57 AM  Result Value Ref Range Status   Specimen Description ARM  Final   Special Requests NONE  Final   Culture NO GROWTH 2 DAYS  Final   Report Status 12/12/2014 FINAL  Final     Studies: No results found.  Scheduled Meds: . apixaban  5 mg Oral BID  .  metoprolol succinate  25 mg Oral Daily  . pantoprazole  40 mg Oral Daily  . piperacillin-tazobactam (ZOSYN)  IV  3.375 g Intravenous 3 times per day  . simvastatin  20 mg Oral Daily  . sodium chloride  3 mL Intravenous Q12H  . vancomycin  750 mg Intravenous Q24H    Continuous Infusions: . sodium chloride 125 mL/hr at 12/12/14 1324    Assessment/Plan:  1 Urosepsis: WCC count improved to 12.3 Cultures so far negative  Diarrhea - probably related to antibiotics. D/c Zosyn and Vanc Check C.diff. Start IV Rocephin pending results  2 Acute Kidney injury with Hydronephrosis ; Better following stent placement Se Creat improved to 1.46 3 A-fib/ Episodes of V.tach;On Eliquis and Metoprolol Appreciated cardiology input.  4 Hypotension:  BP is improving-On  IV Fluids 5 Physical Therapy   Code Status: Full code          12/12/2014, 11:05 AM  LOS: 2 days

## 2014-12-12 NOTE — Progress Notes (Signed)
[facility]  Patient: William PigeonJames B Trautman Sr. / Admit Date: 12/09/2014 / Date of Encounter: 12/12/2014, 6:58 AM   Subjective: Patient is still having some significant back pain and urinary tract infection as well as kidney stone pain although slightly improved today There has been some telemetry changes consistent with wide complex tachycardia yesterday but no evidence of recurrence overnight  Review of Systems: Positive for: Back pain improving Negative for: Vision change, hearing change, syncope, dizziness, nausea, vomiting,diarrhea, bloody stool,  cough, congestion, diaphoresis, urinary frequency,  skin lesions, skin rashes Others previously listed  Objective: Telemetry: Atrial fibrillation with controlled ventricular rate with a few episodes of non-sustained wide-complex tachycardia yesterday but no evidence overnight Physical Exam: Blood pressure 108/68, pulse 75, temperature 98.4 F (36.9 C), temperature source Oral, resp. rate 16, height 5\' 5"  (1.651 m), weight 116 lb 12.8 oz (52.98 kg), SpO2 97 %. Body mass index is 19.44 kg/(m^2). General: Well developed, well nourished, in no acute distress. Head: Normocephalic, atraumatic, sclera non-icteric, no xanthomas, nares are without discharge. Neck: No apparent masses Lungs: Normal respirations with no wheezes, no rhonchi, no rales , no crackles   Heart: Irregular rate and rhythm, normal S1 S2, no murmur, no rub, no gallop, PMI is normal size and placement, carotid upstroke normal without bruit, jugular venous pressure normal Abdomen: Soft,  tender, non-distended with normoactive bowel sounds. No hepatosplenomegaly. Abdominal aorta is normal size without bruit Extremities: No edema, no clubbing, no cyanosis, no ulcers,  Peripheral: 2+ radial, 2+ femoral, 2+ dorsal pedal pulses Neuro: Alert and oriented. Moves all extremities spontaneously. Psych:  Responds to questions appropriately with a normal  affect.   Intake/Output Summary (Last 24 hours) at 12/12/14 0658 Last data filed at 12/12/14 0645  Gross per 24 hour  Intake 2208.75 ml  Output   1825 ml  Net 383.75 ml    Inpatient Medications:  . apixaban  5 mg Oral BID  . metoprolol succinate  25 mg Oral Daily  . pantoprazole  40 mg Oral Daily  . piperacillin-tazobactam (ZOSYN)  IV  3.375 g Intravenous 3 times per day  . simvastatin  20 mg Oral Daily  . sodium chloride  3 mL Intravenous Q12H  . vancomycin  750 mg Intravenous Q36H   Infusions:  . sodium chloride 125 mL/hr at 12/11/14 2313    Labs:  Recent Labs  12/11/14 0457 12/12/14 0545  NA 139 140  K 3.5 3.7  CL 112* 112*  CO2 23 23  GLUCOSE 113* 95  BUN 32* 24*  CREATININE 1.72* 1.46*  CALCIUM 7.7* 8.1*  MG 1.9  --     Recent Labs  12/09/14 2255  AST 25  ALT 18  ALKPHOS 82  BILITOT 1.8*  PROT 7.6  ALBUMIN 4.1    Recent Labs  12/11/14 0457 12/12/14 0545  WBC 12.7* 10.9*  NEUTROABS 9.9* 8.3*  HGB 12.0* 12.6*  HCT 36.5* 37.3*  MCV 91.3 92.3  PLT 257 256    Recent Labs  12/09/14 2255  TROPONINI <0.03   Invalid input(s): POCBNP No results for input(s): HGBA1C in the last 72 hours.   Weights: Filed Weights   12/10/14 0639 12/11/14 0636 12/12/14 0550  Weight: 155 lb 4.8 oz (70.444 kg) 119 lb 9.6 oz (54.25 kg) 116 lb 12.8 oz (52.98 kg)     Radiology/Studies:  [place] Renal  12/10/2014  CLINICAL DATA:  Hydronephrosis and bilateral calculi. Bilateral kidney stones removed last night. Gross hematuria since yesterday. EXAM:  RENAL / URINARY TRACT ULTRASOUND COMPLETE COMPARISON:  CT of the abdomen and pelvis on 12/09/2014 FINDINGS: Right Kidney: Length: 10.8 cm. Multiple small cysts are present. Largest is 1.2 x 1.1 x 1.2 cm. Small intrarenal stones are identified, too small to measure. Left Kidney: Length: 12.3 cm. Mild hydronephrosis. Small cysts are identified. Largest in the lower pole region measures 2.7 x 2.7 x 2.2 cm. Small intrarenal stones  are noted, too small to measure. Bladder: Foley catheter decompresses the bladder. IMPRESSION: 1. Mild left hydronephrosis. 2. Bilateral cysts. 3. Small intrarenal calculi. Electronically Signed   By: Norva Pavlov M.D.   On: 12/10/2014 11:36   Ct Renal Stone Study  12/09/2014  CLINICAL DATA:  Back and abdominal pain for 1 week. Worsening tonight with vomiting and nausea. EXAM: CT ABDOMEN AND PELVIS WITHOUT CONTRAST TECHNIQUE: Multidetector CT imaging of the abdomen and pelvis was performed following the standard protocol without IV contrast. COMPARISON:  CT 07/25/2013 FINDINGS: Lower chest: Linear atelectasis in the left lower lobe. The heart is mildly enlarged. Liver: No evidence focal lesion allowing for lack contrast. Hepatobiliary: Gallbladder physiologically distended, no calcified stone. No evidence of biliary dilatation. Pancreas: No ductal dilatation or surrounding inflammation. Spleen: Normal. Adrenal glands: No nodule.  Mild fullness bilaterally. Kidneys: There is moderate bilateral hydroureteronephrosis and perinephric strandy. On the right this secondary to a 11 x 9 mm stone in the right mid ureter at the level of L3-L4. On the left this is secondary to 9 x 8 mm stone in the left mid distal ureter at the level of L5-S1. Additional nonobstructing stones in both kidneys. There is a cyst in the lower left kidney, additional small bilateral renal cysts and not well seen. Stomach/Bowel: Stomach mildly distended with ingested contents. Small hiatal hernia. There are no dilated or thickened small bowel loops. Small volume of stool throughout the colon without colonic wall thickening. The colon is tortuous. The appendix is normal. Vascular/Lymphatic: No retroperitoneal adenopathy. Abdominal aorta is normal in caliber. Moderate to dense atherosclerosis without aneurysm. Reproductive: Prostate gland is enlarged measuring 5.4 cm transverse dimension. Bladder: Minimally distended.  No wall thickening. Other:  No free air, free fluid, or intra-abdominal fluid collection. Post left inguinal hernia repair with mesh. There is fat in the right inguinal canal. Musculoskeletal: There are no acute or suspicious osseous abnormalities. Stable degenerative change in the spine. Stable mild compression deformity superior endplate of L1. IMPRESSION: 1. Bilateral obstructive uropathy with moderate bilateral hydroureteronephrosis. Large mid ureteral obstructing stones, 11 mm on the right and 9 mm on the left. Recommend urologic consultation. 2. Additional nonobstructing stones in both kidneys. Electronically Signed   By: Rubye Oaks M.D.   On: 12/09/2014 23:26     Assessment and Recommendation  81 y.o. male with chronic nonvalvular atrial fibrillation review sling on appropriate medication management with reasonable heart rate control at this time having significant pain from previous surgery likely causing variability of heart rate and short nonsustained wide-complex tachycardia now resolved 1. Continue metoprolol for wide-complex tachycardia as well as atrial fibrillation without change today 2. Begin ambulation and follow for improvements of pain which is likely contributing to faster heart rate 3. Continue anticoagulation for further risk reduction in stroke with atrial fibrillation watching for worsening hematuria 4. Further adjustments of medications as necessary depending on telemetry changes with ambulation 5. No further cardiac diagnostics necessary at this time 6. Okay for discharge to home from cardiac standpoint with follow-up next week for adjustments of medication management  Signed, Arnoldo Hooker M.D. FACC

## 2014-12-12 NOTE — Progress Notes (Signed)
ANTIBIOTIC CONSULT NOTE - Follow Up  Pharmacy Consult for Vancyomycin/Zosyn dosing Indication: Sepsis/intra-abdominal infection  No Known Allergies  Patient Measurements: Height: 5\' 5"  (165.1 cm) Weight: 116 lb 12.8 oz (52.98 kg) IBW/kg (Calculated) : 61.5 Adjusted Body Weight: n/a  Vital Signs: Temp: 98.3 F (36.8 C) (10/22 0807) Temp Source: Oral (10/22 0807) BP: 127/89 mmHg (10/22 0807) Pulse Rate: 83 (10/22 0807) Intake/Output from previous day: 10/21 0701 - 10/22 0700 In: 2208.8 [P.O.:240; I.V.:1918.8; IV Piggyback:50] Out: 1825 [Urine:1825] Intake/Output from this shift: Total I/O In: -  Out: 100 [Urine:100]  Labs:  Recent Labs  12/09/14 2144 12/09/14 2255 12/11/14 0457 12/12/14 0545  WBC 22.8*  --  12.7* 10.9*  HGB 15.0  --  12.0* 12.6*  PLT 321  --  257 256  CREATININE  --  3.23* 1.72* 1.46*   Estimated Creatinine Clearance: 30.3 mL/min (by C-G formula based on Cr of 1.46). No results for input(s): VANCOTROUGH, VANCOPEAK, VANCORANDOM, GENTTROUGH, GENTPEAK, GENTRANDOM, TOBRATROUGH, TOBRAPEAK, TOBRARND, AMIKACINPEAK, AMIKACINTROU, AMIKACIN in the last 72 hours.   Microbiology: Recent Results (from the past 720 hour(s))  Urine culture     Status: None   Collection Time: 12/09/14  9:44 PM  Result Value Ref Range Status   Specimen Description URINE, RANDOM  Final   Special Requests NONE  Final   Culture MULTIPLE SPECIES PRESENT, SUGGEST RECOLLECTION  Final   Report Status 12/12/2014 FINAL  Final  Blood culture (routine x 2)     Status: None (Preliminary result)   Collection Time: 12/09/14 11:56 PM  Result Value Ref Range Status   Specimen Description BLOOD RIGHT ANTECUBITAL  Final   Special Requests BOTTLES DRAWN AEROBIC AND ANAEROBIC 15CC  Final   Culture NO GROWTH 2 DAYS  Final   Report Status PENDING  Incomplete  Blood culture (routine x 2)     Status: None (Preliminary result)   Collection Time: 12/10/14 12:39 AM  Result Value Ref Range Status   Specimen Description BLOOD RIGHT ANTECUBITAL  Final   Special Requests BOTTLES DRAWN AEROBIC AND ANAEROBIC 10ML  Final   Culture NO GROWTH 2 DAYS  Final   Report Status PENDING  Incomplete  Urine culture     Status: None (Preliminary result)   Collection Time: 12/10/14  1:57 AM  Result Value Ref Range Status   Specimen Description ARM  Final   Special Requests NONE  Final   Culture NO GROWTH 1 DAY  Final   Report Status PENDING  Incomplete    Medical History: Past Medical History  Diagnosis Date  . Essential hypertension   . Atrial fibrillation (HCC)   . Kidney stones   . Hyperlipemia      Assessment: 81 yo male admitted for sepsis and possible intra-abdominal infection  Pharmacy consulted to dose vancomycin and zosyn. Currently ordered Zosyn 3.375 gm IV q12h and Vancomycin 750 mg IV q36h.   UCx: NGTD, BCx: NGTD   PK Parameters: SCr: 1.72, est CrCl~26.3 mL/min, ke: 0.026, t1/2: 26.7 h, Vd: 38 L  10/22: Scr: 1.46 mg/dl; Estimated CrcL; 30.3 ml/min; Kel (hr-1): 0.030 Half-life (hrs): 23.10  Vd (liters): 37.10  (factor used: 0.7 L/kg)   Goal of Therapy:  Resolution of infection Vancomycin trough goal of 15-20  Plan:  1. Scr continues to improve, base on kinetic parameters patient now qualifies for q24 hour dosing. Will change to Vancomycin 750 mg IV q24 hours. Trough level ordered to be drawn prior to the 09:30 dose on 10/25. Level will be  close to CSS.   2) Zosyn: Continue Zosyn 3.375 g IV q8 hours.   Pharmacy will continue to follow.   Gage,Joey Lierman D 12/12/2014,9:31 AM

## 2014-12-13 LAB — CBC WITH DIFFERENTIAL/PLATELET
Basophils Absolute: 0.1 10*3/uL (ref 0–0.1)
Basophils Relative: 1 %
EOS ABS: 0.5 10*3/uL (ref 0–0.7)
Eosinophils Relative: 5 %
HEMATOCRIT: 37.6 % — AB (ref 40.0–52.0)
HEMOGLOBIN: 12.6 g/dL — AB (ref 13.0–18.0)
Lymphocytes Relative: 11 %
Lymphs Abs: 1.1 10*3/uL (ref 1.0–3.6)
MCH: 30.7 pg (ref 26.0–34.0)
MCHC: 33.6 g/dL (ref 32.0–36.0)
MCV: 91.2 fL (ref 80.0–100.0)
MONO ABS: 0.8 10*3/uL (ref 0.2–1.0)
MONOS PCT: 8 %
NEUTROS ABS: 7.6 10*3/uL — AB (ref 1.4–6.5)
NEUTROS PCT: 75 %
Platelets: 260 10*3/uL (ref 150–440)
RBC: 4.12 MIL/uL — ABNORMAL LOW (ref 4.40–5.90)
RDW: 13.6 % (ref 11.5–14.5)
WBC: 10.1 10*3/uL (ref 3.8–10.6)

## 2014-12-13 LAB — BASIC METABOLIC PANEL
Anion gap: 5 (ref 5–15)
BUN: 19 mg/dL (ref 6–20)
CALCIUM: 8.4 mg/dL — AB (ref 8.9–10.3)
CHLORIDE: 115 mmol/L — AB (ref 101–111)
CO2: 25 mmol/L (ref 22–32)
CREATININE: 1.3 mg/dL — AB (ref 0.61–1.24)
GFR calc Af Amer: 58 mL/min — ABNORMAL LOW (ref >60)
GFR calc non Af Amer: 50 mL/min — ABNORMAL LOW (ref >60)
Glucose, Bld: 88 mg/dL (ref 65–99)
Potassium: 3.6 mmol/L (ref 3.5–5.1)
Sodium: 145 mmol/L (ref 135–145)

## 2014-12-13 NOTE — Progress Notes (Signed)
Patient ambulated around nurses station with this RN 5 times (approx. [phone].) and tolerated well. No complaints of dizziness, SOB or pain.

## 2014-12-13 NOTE — Progress Notes (Signed)
[facility] Encounter Note  Patient: William Lozano Sr. / Admit Date: 12/09/2014 / Date of Encounter: 12/13/2014, 6:48 AM   Subjective: Improvements of the urinary tract infection as well as pain There has been some telemetry changes consistent with wide complex tachycardia yesterday but no evidence of recurrence overnight  Review of Systems: Positive for: Back pain improving with less hematuria Negative for: Vision change, hearing change, syncope, dizziness, nausea, vomiting,diarrhea, bloody stool,  cough, congestion, diaphoresis, urinary frequency,  skin lesions, skin rashes Others previously listed  Objective: Telemetry: Atrial fibrillation with controlled ventricular rate with a few episodes of non-sustained wide-complex tachycardia yesterday but no evidence overnight Physical Exam: Blood pressure 130/65, pulse 78, temperature 98.1 F (36.7 C), temperature source Oral, resp. rate 18, height  (1.651 m), weight 117 lb 6.4 oz (53.252 kg), SpO2 98 %. Body mass index is 19.54 kg/(m^2). General: Well developed, well nourished, in no acute distress. Head: Normocephalic, atraumatic, sclera non-icteric, no xanthomas, nares are without discharge. Neck: No apparent masses Lungs: Normal respirations with no wheezes, no rhonchi, no rales , no crackles   Heart: Irregular rate and rhythm, normal S1 S2, no murmur, no rub, no gallop, PMI is normal size and placement, carotid upstroke normal without bruit, jugular venous pressure normal Abdomen: Soft,  tender, non-distended with normoactive bowel sounds. No hepatosplenomegaly. Abdominal aorta is normal size without bruit Extremities: No edema, no clubbing, no cyanosis, no ulcers,  Peripheral: 2+ radial, 2+ femoral, 2+ dorsal pedal pulses Neuro: Alert and oriented. Moves all extremities spontaneously. Psych:  Responds to questions appropriately with a normal affect.   Intake/Output Summary (Last 24 hours) at 12/13/14  0648 Last data filed at 12/13/14 0631  Gross per 24 hour  Intake 1997.5 ml  Output   1025 ml  Net  972.5 ml    Inpatient Medications:  . apixaban  5 mg Oral BID  . cefTRIAXone (ROCEPHIN)  IV  1 g Intravenous Q24H  . metoprolol succinate  25 mg Oral Daily  . pantoprazole  40 mg Oral Daily  . simvastatin  20 mg Oral Daily  . sodium chloride  3 mL Intravenous Q12H   Infusions:  . sodium chloride 100 mL/hr at 12/12/14 1139    Labs:  Recent Labs  12/11/14 0457 12/12/14 0545 12/13/14 0543  NA 139 140 145  K 3.5 3.7 3.6  CL 112* 112* 115*  CO2 GLUCOSE 113* 95 88  BUN 32* 24* 19  CREATININE 1.72* 1.46* 1.30*  CALCIUM 7.7* 8.1* 8.4*  MG 1.9  --   --    No results for input(s): AST, ALT, ALKPHOS, BILITOT, PROT, ALBUMIN in the last 72 hours.  Recent Labs  12/12/14 0545 12/13/14 0543  WBC 10.9* 10.1  NEUTROABS 8.3* 7.6*  HGB 12.6* 12.6*  HCT 37.3* 37.6*  MCV 92.3 91.2  PLT 256 260   No results for input(s): CKTOTAL, CKMB, TROPONINI in the last 72 hours. Invalid input(s): POCBNP No results for input(s): HGBA1C in the last 72 hours.   Weights: Filed Weights   12/11/14 0636 12/12/14 0550 12/13/14 0500  Weight: 119 lb 9.6 oz (54.25 kg) 116 lb 12.8 oz (52.98 kg) 117 lb 6.4 oz (53.252 kg)     Radiology/Studies:  US Renal  12/10/2014  CLINICAL DATA:  Hydronephrosis and bilateral calculi. Bilateral kidney stones removed last night. Gross hematuria since yesterday. EXAM: RENAL / URINARY TRACT ULTRASOUND COMPLETE COMPARISON:  CT of the abdomen and pelvis on 12/09/2014  FINDINGS: Right Kidney: Length: 10.8 cm. Multiple small cysts are present. Largest is 1.2 x 1.1 x 1.2 cm. Small intrarenal stones are identified, too small to measure. Left Kidney: Length: 12.3 cm. Mild hydronephrosis. Small cysts are identified. Largest in the lower pole region measures 2.7 x 2.7 x 2.2 cm. Small intrarenal stones are noted, too small to measure. Bladder: Foley catheter decompresses  the bladder. IMPRESSION: 1. Mild left hydronephrosis. 2. Bilateral cysts. 3. Small intrarenal calculi. Electronically Signed   By: Norva PavlovElizabeth  Brown M.D.   On: 12/10/2014 11:36   Ct Renal Stone Study  12/09/2014  CLINICAL DATA:  Back and abdominal pain for 1 week. Worsening tonight with vomiting and nausea. EXAM: CT ABDOMEN AND PELVIS WITHOUT CONTRAST TECHNIQUE: Multidetector CT imaging of the abdomen and pelvis was performed following the standard protocol without IV contrast. COMPARISON:  CT 07/25/2013 FINDINGS: Lower chest: Linear atelectasis in the left lower lobe. The heart is mildly enlarged. Liver: No evidence focal lesion allowing for lack contrast. Hepatobiliary: Gallbladder physiologically distended, no calcified stone. No evidence of biliary dilatation. Pancreas: No ductal dilatation or surrounding inflammation. Spleen: Normal. Adrenal glands: No nodule.  Mild fullness bilaterally. Kidneys: There is moderate bilateral hydroureteronephrosis and perinephric strandy. On the right this secondary to a 11 x 9 mm stone in the right mid ureter at the level of L3-L4. On the left this is secondary to 9 x 8 mm stone in the left mid distal ureter at the level of L5-S1. Additional nonobstructing stones in both kidneys. There is a cyst in the lower left kidney, additional small bilateral renal cysts and not well seen. Stomach/Bowel: Stomach mildly distended with ingested contents. Small hiatal hernia. There are no dilated or thickened small bowel loops. Small volume of stool throughout the colon without colonic wall thickening. The colon is tortuous. The appendix is normal. Vascular/Lymphatic: No retroperitoneal adenopathy. Abdominal aorta is normal in caliber. Moderate to dense atherosclerosis without aneurysm. Reproductive: Prostate gland is enlarged measuring 5.4 cm transverse dimension. Bladder: Minimally distended.  No wall thickening. Other: No free air, free fluid, or intra-abdominal fluid collection. Post  left inguinal hernia repair with mesh. There is fat in the right inguinal canal. Musculoskeletal: There are no acute or suspicious osseous abnormalities. Stable degenerative change in the spine. Stable mild compression deformity superior endplate of L1. IMPRESSION: 1. Bilateral obstructive uropathy with moderate bilateral hydroureteronephrosis. Large mid ureteral obstructing stones, 11 mm on the right and 9 mm on the left. Recommend urologic consultation. 2. Additional nonobstructing stones in both kidneys. Electronically Signed   By: Rubye OaksMelanie  Ehinger M.D.   On: 12/09/2014 23:26     Assessment and Recommendation  81 y.o. male with chronic nonvalvular atrial fibrillation review sling on appropriate medication management with reasonable heart rate control at this time having significant pain from previous surgery likely causing variability of heart rate and short nonsustained wide-complex tachycardia now resolved 1. Continue metoprolol for wide-complex tachycardia as well as atrial fibrillation without change today 2. Begin ambulation and follow for improvements of pain which is likely contributing to faster heart rate 3. Continue anticoagulation for further risk reduction in stroke with atrial fibrillation watching for worsening hematuria 4. Further adjustments of medications as necessary depending on telemetry changes with ambulation 5. No further cardiac diagnostics necessary at this time 6. Okay for discharge to home from cardiac standpoint with follow-up next week for adjustments of medication management  Signed, Arnoldo HookerBruce Shayma Pfefferle M.D. FACC

## 2014-12-13 NOTE — Progress Notes (Signed)
PROGRESS NOTE  William Lozano ZOX:[id number] DOB: 1932-07-21 DOA: 12/09/2014 PCP: Marguarite Arbour, MD  Subjective   81 y/o m with hx of A-fib, HTN, Type 2 DM, Kidney stones admitted with Acute Kidney injury, Bilat Hydronephrosis and Hypotension secondary to sepsis. Underwent stent placement This am continues to have diarrhea Consultants:  Cardiology; [facility]  Urology- Dr. Sherryl Barters   Objective: BP 130/65 mmHg  Pulse 78  Temp(Src) 98.1 F (36.7 C) (Oral)  Resp 18  Ht  (1.651 m)  Wt 53.252 kg (117 lb 6.4 oz)  BMI 19.54 kg/m2  SpO2 98%  Intake/Output Summary (Last 24 hours) at 12/13/14 1037 Last data filed at 12/13/14 0847  Gross per 24 hour  Intake 1877.5 ml  Output   1200 ml  Net  677.5 ml   Filed Weights   12/11/14 0636 12/12/14 0550 12/13/14 0500  Weight: 54.25 kg (119 lb 9.6 oz) 52.98 kg (116 lb 12.8 oz) 53.252 kg (117 lb 6.4 oz)    Exam:  General: Well developed, well nourished male, in NAD HEENT: PERRL; OP moist without lesions. Neck: supple, trachea midline, no thyromegaly Chest: normal to palpation Lungs: clear bilaterally without retractions or wheezes Cardiovascular: RRR, no murmur, no gallop Abdomen: soft, nontender, nondistended, positive bowel sounds Extremities: no clubbing, cyanosis, edema Neuro: alert and oriented, moves all extremities Derm: no significant rashes or nodules; good skin turgor Lymph: no cervical or supraclavicular lymphadenopathy   Labs and imaging studies were reviewed*  Data Reviewed: Basic Metabolic Panel:  Recent Labs Lab 12/09/14 2255 12/11/14 0457 12/12/14 0545 12/13/14 0543  NA 138 139 140 145  K 4.5 3.5 3.7 3.6  CL 99* 112* 112* 115*  CO2 GLUCOSE 184* 113* 95 88  BUN 47* 32* 24* 19  CREATININE 3.23* 1.72* 1.46* 1.30*  CALCIUM 9.7 7.7* 8.1* 8.4*  MG  --  1.9  --   --    Liver Function Tests:  Recent Labs Lab 12/09/14 2255  AST 25  ALT 18  ALKPHOS 82  BILITOT 1.8*  PROT  7.6  ALBUMIN 4.1   No results for input(s): LIPASE, AMYLASE in the last 168 hours. No results for input(s): AMMONIA in the last 168 hours. CBC:  Recent Labs Lab 12/09/14 2144 12/11/14 0457 12/12/14 0545 12/13/14 0543  WBC 22.8* 12.7* 10.9* 10.1  NEUTROABS 20.9* 9.9* 8.3* 7.6*  HGB 15.0 12.0* 12.6* 12.6*  HCT 45.6 36.5* 37.3* 37.6*  MCV 91.6 91.3 92.3 91.2  PLT 321 257 256 260   Cardiac Enzymes:    Recent Labs Lab 12/09/14 2255  TROPONINI <0.03   BNP (last 3 results) No results for input(s): BNP in the last 8760 hours.  ProBNP (last 3 results) No results for input(s): PROBNP in the last 8760 hours.  CBG: No results for input(s): GLUCAP in the last 168 hours.  Recent Results (from the past 240 hour(s))  Urine culture     Status: None   Collection Time: 12/09/14  9:44 PM  Result Value Ref Range Status   Specimen Description URINE, RANDOM  Final   Special Requests NONE  Final   Culture MULTIPLE SPECIES PRESENT, SUGGEST RECOLLECTION  Final   Report Status 12/12/2014 FINAL  Final  Blood culture (routine x 2)     Status: None (Preliminary result)   Collection Time: 12/09/14 11:56 PM  Result Value Ref Range Status   Specimen Description BLOOD RIGHT ANTECUBITAL  Final   Special Requests BOTTLES DRAWN AEROBIC AND  ANAEROBIC 15CC  Final   Culture NO GROWTH 3 DAYS  Final   Report Status PENDING  Incomplete  Blood culture (routine x 2)     Status: None (Preliminary result)   Collection Time: 12/10/14 12:39 AM  Result Value Ref Range Status   Specimen Description BLOOD RIGHT ANTECUBITAL  Final   Special Requests BOTTLES DRAWN AEROBIC AND ANAEROBIC 10ML  Final   Culture NO GROWTH 3 DAYS  Final   Report Status PENDING  Incomplete  Urine culture     Status: None   Collection Time: 12/10/14  1:57 AM  Result Value Ref Range Status   Specimen Description ARM  Final   Special Requests NONE  Final   Culture NO GROWTH 2 DAYS  Final   Report Status 12/12/2014 FINAL  Final  C  difficile quick scan w PCR reflex     Status: None   Collection Time: 12/12/14 10:40 AM  Result Value Ref Range Status   C Diff antigen NEGATIVE NEGATIVE Final   C Diff toxin NEGATIVE NEGATIVE Final   C Diff interpretation Negative for C. difficile  Final     Studies: No results found.  Scheduled Meds: . apixaban  5 mg Oral BID  . cefTRIAXone (ROCEPHIN)  IV  1 g Intravenous Q24H  . metoprolol succinate  25 mg Oral Daily  . pantoprazole  40 mg Oral Daily  . simvastatin  20 mg Oral Daily  . sodium chloride  3 mL Intravenous Q12H    Continuous Infusions: . sodium chloride 100 mL/hr at 12/12/14 1139    Assessment/Plan:  1 Urosepsis: WCC count improved  Cultures so far negative  Diarrhea - C.diff negative Check stool cultures D;cd Zosyn and Vanc On  IV Rocephin  2 Acute Kidney injury with Hydronephrosis ; Better following stent placement Se Creat improved to 1.30 3 A-fib/ Episodes of V.tach;On Eliquis and Metoprolol Appreciated cardiology input.  4 Hypotension:  BP is improving-On  IV Fluids 5 Physical Therapy   Code Status: Full code          12/13/2014, 10:37 AM  LOS: 3 days

## 2014-12-14 LAB — CBC WITH DIFFERENTIAL/PLATELET
BASOS ABS: 0.1 10*3/uL (ref 0–0.1)
BASOS PCT: 1 %
EOS PCT: 4 %
Eosinophils Absolute: 0.4 10*3/uL (ref 0–0.7)
HEMATOCRIT: 37.3 % — AB (ref 40.0–52.0)
Hemoglobin: 12.3 g/dL — ABNORMAL LOW (ref 13.0–18.0)
Lymphocytes Relative: 10 %
Lymphs Abs: 1 10*3/uL (ref 1.0–3.6)
MCH: 30.2 pg (ref 26.0–34.0)
MCHC: 33.1 g/dL (ref 32.0–36.0)
MCV: 91.4 fL (ref 80.0–100.0)
MONO ABS: 0.7 10*3/uL (ref 0.2–1.0)
Monocytes Relative: 7 %
NEUTROS ABS: 8.5 10*3/uL — AB (ref 1.4–6.5)
Neutrophils Relative %: 78 %
PLATELETS: 281 10*3/uL (ref 150–440)
RBC: 4.08 MIL/uL — ABNORMAL LOW (ref 4.40–5.90)
RDW: 13.6 % (ref 11.5–14.5)
WBC: 10.8 10*3/uL — ABNORMAL HIGH (ref 3.8–10.6)

## 2014-12-14 LAB — BASIC METABOLIC PANEL
ANION GAP: 6 (ref 5–15)
BUN: 17 mg/dL (ref 6–20)
CALCIUM: 8.6 mg/dL — AB (ref 8.9–10.3)
CO2: 26 mmol/L (ref 22–32)
Chloride: 115 mmol/L — ABNORMAL HIGH (ref 101–111)
Creatinine, Ser: 1.27 mg/dL — ABNORMAL HIGH (ref 0.61–1.24)
GFR, EST AFRICAN AMERICAN: 60 mL/min — AB (ref >60)
GFR, EST NON AFRICAN AMERICAN: 52 mL/min — AB (ref >60)
Glucose, Bld: 96 mg/dL (ref 65–99)
Potassium: 3.8 mmol/L (ref 3.5–5.1)
Sodium: 147 mmol/L — ABNORMAL HIGH (ref 135–145)

## 2014-12-14 MED ORDER — HYDROCHLOROTHIAZIDE 25 MG PO TABS: 25.0000 mg | ORAL_TABLET | Freq: Every day | ORAL | Status: DC | PRN

## 2014-12-14 MED ORDER — CEFUROXIME AXETIL 250 MG PO TABS: 250.0000 mg | ORAL_TABLET | Freq: Two times a day (BID) | ORAL | Status: DC

## 2014-12-14 MED ORDER — CEFUROXIME AXETIL 250 MG PO TABS
250.0000 mg | ORAL_TABLET | Freq: Two times a day (BID) | ORAL | Status: DC
  Administered 2014-12-14: 250 mg via ORAL
  Filled 2014-12-14: qty 1

## 2014-12-14 MED ORDER — ONDANSETRON HCL 4 MG PO TABS: 4.0000 mg | ORAL_TABLET | Freq: Four times a day (QID) | ORAL | Status: DC | PRN

## 2014-12-14 NOTE — Progress Notes (Signed)
Pt. Discharged to home via wc. Discharge instructions and medication regimen reviewed at bedside with patient. Pt. verbalizes understanding of instructions and medication regimen. Prescriptions given with discharge papers. Patient assessment unchanged from this morning. TELE and IV discontinued per policy.   

## 2014-12-14 NOTE — Discharge Instructions (Signed)
Call if worse  Cystoscopy Cystoscopy is a procedure that is used to help your caregiver diagnose and sometimes treat conditions that affect your lower urinary tract. Your lower urinary tract includes your bladder and the tube through which urine passes from your bladder out of your body (urethra). Cystoscopy is performed with a thin, tube-shaped instrument (cystoscope). The cystoscope has lenses and a light at the end so that your caregiver can see inside your bladder. The cystoscope is inserted at the entrance of your urethra. Your caregiver guides it through your urethra and into your bladder. There are two main types of cystoscopy:  Flexible cystoscopy (with a flexible cystoscope).  Rigid cystoscopy (with a rigid cystoscope). Cystoscopy may be recommended for many conditions, including:  Urinary tract infections.  Blood in your urine (hematuria).  Loss of bladder control (urinary incontinence) or overactive bladder.  Unusual cells found in a urine sample.  Urinary blockage.  Painful urination. Cystoscopy may also be done to remove a sample of your tissue to be checked under a microscope (biopsy). It may also be done to remove or destroy bladder stones. LET YOUR CAREGIVER KNOW ABOUT:  Allergies to food or medicine.  Medicines taken, including vitamins, herbs, eyedrops, over-the-counter medicines, and creams.  Use of steroids (by mouth or creams).  Previous problems with anesthetics or numbing medicines.  History of bleeding problems or blood clots.  Previous surgery.  Other health problems, including diabetes and kidney problems.  Possibility of pregnancy, if this applies. PROCEDURE The area around the opening to your urethra will be cleaned. A medicine to numb your urethra (local anesthetic) is used. If a tissue sample or stone is removed during the procedure, you may be given a medicine to make you sleep (general anesthetic). Your caregiver will gently insert the tip of  the cystoscope into your urethra. The cystoscope will be slowly glided through your urethra and into your bladder. Sterile fluid will flow through the cystoscope and into your bladder. The fluid will expand and stretch your bladder. This gives your caregiver a better view of your bladder walls. The procedure lasts about 15-20 minutes. AFTER THE PROCEDURE If a local anesthetic is used, you will be allowed to go home as soon as you are ready. If a general anesthetic is used, you will be taken to a recovery area until you are stable. You may have temporary bleeding and burning on urination.   This information is not intended to replace advice given to you by your health care provider. Make sure you discuss any questions you have with your health care provider.   Document Released: 02/04/2000 Document Revised: 02/27/2014 Document Reviewed: 07/31/2011 Elsevier Interactive Patient Education 2016 [employer]  Cystoscopy, Care After Refer to this sheet in the next few weeks. These instructions provide you with information on caring for yourself after your procedure. Your caregiver may also give you more specific instructions. Your treatment has been planned according to current medical practices, but problems sometimes occur. Call your caregiver if you have any problems or questions after your procedure. HOME CARE INSTRUCTIONS  Things you can do to ease any discomfort after your procedure include:  Drinking enough water and fluids to keep your urine clear or pale yellow.  Taking a warm bath to relieve any burning feelings. SEEK IMMEDIATE MEDICAL CARE IF:   You have an increase in blood in your urine.  You notice blood clots in your urine.  You have difficulty passing urine.  You have the chills.  You have abdominal pain.  You have a fever or persistent symptoms for more than 2-3 days.  You have a fever and your symptoms suddenly get worse. MAKE SURE YOU:   Understand these  instructions.  Will watch your condition.  Will get help right away if you are not doing well or get worse.   This information is not intended to replace advice given to you by your health care provider. Make sure you discuss any questions you have with your health care provider.   Document Released: 08/26/2004 Document Revised: 02/27/2014 Document Reviewed: 07/31/2011 Elsevier Interactive Patient Education 2016 [employer]   Antibiotic Medicine Antibiotic medicines are used to treat infections caused by bacteria. They work by hurting or killing the germs that are making you sick. HOW WILL MY MEDICINE BE PICKED? There are many kinds of antibiotic medicines. To help your doctor pick one, tell your doctor if:  You have any allergies.  You are pregnant or plan to get pregnant.  You are breastfeeding.  You are taking any medicines. These include over-the-counter medicines, prescription medicines, and herbal remedies.  You have a medical condition or problem. If you have questions about why your medicine was picked, ask. FOR HOW LONG SHOULD I TAKE MY MEDICINE? Take your medicine for as long as your doctor tells you to. Do not stop taking it when you feel better. If you stop taking it too soon:  You may start to feel sick again.  Your infection may get harder to treat.  New problems may develop. WHAT IF I MISS A DOSE? Try not to miss any doses of antibiotic medicine. If you miss a dose:  Take the dose as soon as you can.  If you are taking 2 doses a day, take the next dose in 5 to 6 hours.  If you are taking 3 or more doses a day, take the next dose in 2 to 4 hours. Then go back to the normal schedule. If you cannot take a missed dose, take the next dose on time. Then take the missed dose after you have taken all the doses as told by your doctor, as if you had one more dose left. DOES THIS MEDICINE AFFECT BIRTH CONTROL? Birth control pills may not work while you are on  antibiotic medicines. If you are taking birth control pills, keep taking them as usual. Use a second form of birth control, such as a condom. Keep using the second form of birth control until you are finished with your current 1 month cycle of birth control pills. GET HELP IF:  You get worse.  You do not feel better a few days after starting the medicine.  You throw up (vomit).  There are white patches in your mouth.  You have new joint pain after starting the medicine.  You have new muscle aches after starting the medicine.  You had a fever before starting the medicine, and it comes back.  You have any symptoms of an allergic reaction, such as an itchy rash. If this happens, stop taking the medicine. GET HELP RIGHT AWAY IF:  Your pee (urine) turns dark or becomes blood-colored.  Your skin turns yellow.  You bruise or bleed easily.  You have very bad watery poop (diarrhea) and cramps in your belly (abdomen).  You have a very bad headache.  You have signs of a very bad allergic reaction, such as:  Trouble breathing.  Wheezing.  Swelling of the lips, tongue, or face.  Fainting.  Blisters on the skin or in the mouth. If you have signs of a very bad allergic reaction, stop taking the antibiotic medicine right away.   This information is not intended to replace advice given to you by your health care provider. Make sure you discuss any questions you have with your health care provider.   Document Released: 11/16/2007 Document Revised: 10/28/2014 Document Reviewed: 06/24/2014 Elsevier Interactive Patient Education [employer]

## 2014-12-14 NOTE — Discharge Summary (Signed)
William Lozano., is a 81 y.o. male  DOB October 31, 1932  MRN [id number].  Admission date:  12/09/2014  Admitting Physician  Arnaldo Natal, MD  Discharge Date:  12/14/2014   Primary MD  SPARKS,JEFFREY D, MD  Recommendations for primary care physician for things to follow:      Admission Diagnosis  Back pain [M54.9] Acute cystitis without hematuria [N30.00] Bilateral kidney stones [N20.0] Sepsis, due to unspecified organism [facility]) [A41.9]   Discharge Diagnosis  Back pain [M54.9] Acute cystitis without hematuria [N30.00] Bilateral kidney stones [N20.0] Sepsis, due to unspecified organism (HCC) [A41.9]  Rapid A-fib, AKI  Active Problems:   Sepsis [facility])      Past Medical History  Diagnosis Date  . Essential hypertension   . Atrial fibrillation (HCC)   . Kidney stones   . Hyperlipemia     Past Surgical History  Procedure Laterality Date  . Hernia repair Bilateral     inguinal  . Cystoscopy with stent placement Bilateral 12/10/2014    Procedure: CYSTOSCOPY WITH STENT PLACEMENT;  Surgeon: Hildred Laser, MD;  Location: [facility] ORS;  Service: Urology;  Laterality: Bilateral;  . Cystoscopy with retrograde pyelogram, ureteroscopy and stent placement Bilateral 12/10/2014    Procedure: CYSTOSCOPY WITH RETROGRADE PYELOGRAM, URETEROSCOPY AND STENT PLACEMENT;  Surgeon: Hildred Laser, MD;  Location: [facility] ORS;  Service: Urology;  Laterality: Bilateral;       History of present illness and  Hospital Course:     Kindly see H&P for history of present illness and admission details, please review complete Labs, Consult reports and Test reports for all details in brief  HPI  from the history and physical done on the day of admission    Hospital Course    Pt admitted with rapid a-fib associated with hydronephrosis and sepsis from UTI. Seen by Urology and Cardiology. Urinary stent placed with  improvement of sx's. Placed on IV ABX and then converted to po. Doing well. A-fib with good rate control. Back on Eliquis.    Discharge Condition: stable   Follow UP  Follow-up Information    Follow up with SPARKS,JEFFREY D, MD In 3 days.   Specialty:  Internal Medicine   Contact information:   [address] [facility] [place] [address] [phone]         Discharge Instructions  and  Discharge Medications   Return if worse     Medication List    STOP taking these medications        ciprofloxacin 250 MG tablet  Commonly known as:  CIPRO     ondansetron 4 MG disintegrating tablet  Commonly known as:  ZOFRAN-ODT      TAKE these medications        apixaban 5 MG Tabs tablet  Commonly known as:  ELIQUIS  Take 5 mg by mouth 2 (two) times daily.     cefUROXime 250 MG tablet  Commonly known as:  CEFTIN  Take 1 tablet (250 mg total) by mouth 2 (  two) times daily with a meal.     hydrochlorothiazide 25 MG tablet  Commonly known as:  HYDRODIURIL  Take 1 tablet (25 mg total) by mouth daily as needed.     HYDROcodone-acetaminophen 5-325 MG tablet  Commonly known as:  NORCO/VICODIN  Take 1 tablet by mouth every 4 (four) hours as needed for moderate pain.     metoprolol succinate 25 MG 24 hr tablet  Commonly known as:  TOPROL-XL  Take 25 mg by mouth daily.     ondansetron 4 MG tablet  Commonly known as:  ZOFRAN  Take 1 tablet (4 mg total) by mouth every 6 (six) hours as needed for nausea.     pantoprazole 40 MG tablet  Commonly known as:  PROTONIX  Take 40 mg by mouth daily.     promethazine 12.5 MG tablet  Commonly known as:  PHENERGAN  Take 12.5 mg by mouth every 6 (six) hours as needed for nausea or vomiting.     simvastatin 20 MG tablet  Commonly known as:  ZOCOR  Take 20 mg by mouth daily.          Diet and Activity recommendation: See Discharge Instructions above   Consults obtained - Cardiology, Urology   Major  procedures and Radiology Reports - PLEASE review detailed and final reports for all details, in brief -   See below   US Renal  12/10/2014  CLINICAL DATA:  Hydronephrosis and bilateral calculi. Bilateral kidney stones removed last night. Gross hematuria since yesterday. EXAM: RENAL / URINARY TRACT ULTRASOUND COMPLETE COMPARISON:  CT of the abdomen and pelvis on 12/09/2014 FINDINGS: Right Kidney: Length: 10.8 cm. Multiple small cysts are present. Largest is 1.2 x 1.1 x 1.2 cm. Small intrarenal stones are identified, too small to measure. Left Kidney: Length: 12.3 cm. Mild hydronephrosis. Small cysts are identified. Largest in the lower pole region measures 2.7 x 2.7 x 2.2 cm. Small intrarenal stones are noted, too small to measure. Bladder: Foley catheter decompresses the bladder. IMPRESSION: 1. Mild left hydronephrosis. 2. Bilateral cysts. 3. Small intrarenal calculi. Electronically Signed   By: Norva Pavlov M.D.   On: 12/10/2014 11:36   Ct Renal Stone Study  12/09/2014  CLINICAL DATA:  Back and abdominal pain for 1 week. Worsening tonight with vomiting and nausea. EXAM: CT ABDOMEN AND PELVIS WITHOUT CONTRAST TECHNIQUE: Multidetector CT imaging of the abdomen and pelvis was performed following the standard protocol without IV contrast. COMPARISON:  CT 07/25/2013 FINDINGS: Lower chest: Linear atelectasis in the left lower lobe. The heart is mildly enlarged. Liver: No evidence focal lesion allowing for lack contrast. Hepatobiliary: Gallbladder physiologically distended, no calcified stone. No evidence of biliary dilatation. Pancreas: No ductal dilatation or surrounding inflammation. Spleen: Normal. Adrenal glands: No nodule.  Mild fullness bilaterally. Kidneys: There is moderate bilateral hydroureteronephrosis and perinephric strandy. On the right this secondary to a 11 x 9 mm stone in the right mid ureter at the level of L3-L4. On the left this is secondary to 9 x 8 mm stone in the left mid distal  ureter at the level of L5-S1. Additional nonobstructing stones in both kidneys. There is a cyst in the lower left kidney, additional small bilateral renal cysts and not well seen. Stomach/Bowel: Stomach mildly distended with ingested contents. Small hiatal hernia. There are no dilated or thickened small bowel loops. Small volume of stool throughout the colon without colonic wall thickening. The colon is tortuous. The appendix is normal. Vascular/Lymphatic: No retroperitoneal adenopathy. Abdominal  aorta is normal in caliber. Moderate to dense atherosclerosis without aneurysm. Reproductive: Prostate gland is enlarged measuring 5.4 cm transverse dimension. Bladder: Minimally distended.  No wall thickening. Other: No free air, free fluid, or intra-abdominal fluid collection. Post left inguinal hernia repair with mesh. There is fat in the right inguinal canal. Musculoskeletal: There are no acute or suspicious osseous abnormalities. Stable degenerative change in the spine. Stable mild compression deformity superior endplate of L1. IMPRESSION: 1. Bilateral obstructive uropathy with moderate bilateral hydroureteronephrosis. Large mid ureteral obstructing stones, 11 mm on the right and 9 mm on the left. Recommend urologic consultation. 2. Additional nonobstructing stones in both kidneys. Electronically Signed   By: Rubye OaksMelanie  Ehinger M.D.   On: 12/09/2014 23:26    Micro Results   See below  Recent Results (from the past 240 hour(s))  Urine culture     Status: None   Collection Time: 12/09/14  9:44 PM  Result Value Ref Range Status   Specimen Description URINE, RANDOM  Final   Special Requests NONE  Final   Culture MULTIPLE SPECIES PRESENT, SUGGEST RECOLLECTION  Final   Report Status 12/12/2014 FINAL  Final  Blood culture (routine x 2)     Status: None (Preliminary result)   Collection Time: 12/09/14 11:56 PM  Result Value Ref Range Status   Specimen Description BLOOD RIGHT ANTECUBITAL  Final   Special  Requests BOTTLES DRAWN AEROBIC AND ANAEROBIC 15CC  Final   Culture NO GROWTH 3 DAYS  Final   Report Status PENDING  Incomplete  Blood culture (routine x 2)     Status: None (Preliminary result)   Collection Time: 12/10/14 12:39 AM  Result Value Ref Range Status   Specimen Description BLOOD RIGHT ANTECUBITAL  Final   Special Requests BOTTLES DRAWN AEROBIC AND ANAEROBIC 10ML  Final   Culture NO GROWTH 3 DAYS  Final   Report Status PENDING  Incomplete  Urine culture     Status: None   Collection Time: 12/10/14  1:57 AM  Result Value Ref Range Status   Specimen Description ARM  Final   Special Requests NONE  Final   Culture NO GROWTH 2 DAYS  Final   Report Status 12/12/2014 FINAL  Final  C difficile quick scan w PCR reflex     Status: None   Collection Time: 12/12/14 10:40 AM  Result Value Ref Range Status   C Diff antigen NEGATIVE NEGATIVE Final   C Diff toxin NEGATIVE NEGATIVE Final   C Diff interpretation Negative for C. difficile  Final       Today   Subjective:   Latanya MaudlinJames Bialas today has no headache,no chest abdominal pain,no new weakness tingling or numbness, feels much better wants to go home today.   Objective:   Blood pressure 136/69, pulse 74, temperature 97.8 F (36.6 C), temperature source Oral, resp. rate 18, height 5\' 5"  (1.651 m), weight 76.386 kg (168 lb 6.4 oz), SpO2 96 %.   Intake/Output Summary (Last 24 hours) at 12/14/14 0641 Last data filed at 12/14/14 0549  Gross per 24 hour  Intake 1963.33 ml  Output    950 ml  Net 1013.33 ml    Exam Awake Alert, Oriented x 3, No new F.N deficits, Normal affect Hall.AT,PERRAL Supple Neck,No JVD, No cervical lymphadenopathy appriciated.  Symmetrical Chest wall movement, Good air movement bilaterally, CTAB irregualry irregular,No Gallops,Rubs or new Murmurs, No Parasternal Heave +ve B.Sounds, Abd Soft, Non tender, No organomegaly appriciated, No rebound -guarding or rigidity. No Cyanosis, Clubbing  or edema, No new  Rash or bruise  Data Review   CBC w Diff: Lab Results  Component Value Date   WBC 10.1 12/13/2014   HGB 12.6* 12/13/2014   HCT 37.6* 12/13/2014   PLT 260 12/13/2014   LYMPHOPCT 11 12/13/2014   MONOPCT 8 12/13/2014   EOSPCT 5 12/13/2014   BASOPCT 1 12/13/2014    CMP: Lab Results  Component Value Date   NA 145 12/13/2014   K 3.6 12/13/2014   CL 115* 12/13/2014   CO2 25 12/13/2014   BUN 19 12/13/2014   CREATININE 1.30* 12/13/2014   PROT 7.6 12/09/2014   ALBUMIN 4.1 12/09/2014   BILITOT 1.8* 12/09/2014   ALKPHOS 82 12/09/2014   AST 25 12/09/2014   ALT 18 12/09/2014  .   Total Time in preparing paper work, data evaluation and todays exam - 45 minutes  SPARKS,JEFFREY D M.D on 12/14/2014 at 6:41 AM

## 2014-12-14 NOTE — Progress Notes (Signed)
[facility] ([facility]) Cardiology [facility] Encounter Note  Patient: William OSTROVSKY Sr. / Admit Date: 12/09/2014 / Date of Encounter: 12/14/2014, 7:24 AM   Subjective: Patient has been sleeping very well and has had no evidence of significant new electrical activity and heart rate is well controlled without evidence of wide complex tachycardia  Review of Systems: Positive for: Lower abdominal discomfort Negative for: Vision change, hearing change, syncope, dizziness, nausea, vomiting,diarrhea, bloody stool,  cough, congestion, diaphoresis, urinary frequency,  skin lesions, skin rashes Others previously listed  Objective: Telemetry: Atrial fibrillation with controlled ventricular rate   Physical Exam: Blood pressure 136/69, pulse 74, temperature 97.8 F (36.6 C), temperature source Oral, resp. rate 18, height  (1.651 m), weight 168 lb 6.4 oz (76.386 kg), SpO2 96 %. Body mass index is 28.02 kg/(m^2). General: Well developed, well nourished, in no acute distress. Head: Normocephalic, atraumatic, sclera non-icteric, no xanthomas, nares are without discharge. Neck: No apparent masses Lungs: Normal respirations with no wheezes, no rhonchi, no rales , no crackles   Heart: Irregular rate and rhythm, normal S1 S2, no murmur, no rub, no gallop, PMI is normal size and placement, carotid upstroke normal without bruit, jugular venous pressure normal Abdomen: Soft,  tender, non-distended with normoactive bowel sounds. No hepatosplenomegaly. Abdominal aorta is normal size without bruit Extremities: No edema, no clubbing, no cyanosis, no ulcers,  Peripheral: 2+ radial, 2+ femoral, 2+ dorsal pedal pulses Neuro: Alert and oriented. Moves all extremities spontaneously. Psych:  Responds to questions appropriately with a normal affect.   Intake/Output Summary (Last 24 hours) at 12/14/14 0724 Last data filed at 12/14/14 0700  Gross per 24 hour  Intake   2655 ml  Output    950 ml  Net   1705 ml     Inpatient Medications:  . apixaban  5 mg Oral BID  . cefUROXime  250 mg Oral BID WC  . metoprolol succinate  25 mg Oral Daily  . pantoprazole  40 mg Oral Daily  . simvastatin  20 mg Oral Daily   Infusions:     Labs:  Recent Labs  12/12/14 0545 12/13/14 0543  NA 140 145  K 3.7 3.6  CL 112* 115*  CO2 23 25  GLUCOSE 95 88  BUN 24* 19  CREATININE 1.46* 1.30*  CALCIUM 8.1* 8.4*   No results for input(s): AST, ALT, ALKPHOS, BILITOT, PROT, ALBUMIN in the last 72 hours.  Recent Labs  12/12/14 0545 12/13/14 0543  WBC 10.9* 10.1  NEUTROABS 8.3* 7.6*  HGB 12.6* 12.6*  HCT 37.3* 37.6*  MCV 92.3 91.2  PLT 256 260   No results for input(s): CKTOTAL, CKMB, TROPONINI in the last 72 hours. Invalid input(s): POCBNP No results for input(s): HGBA1C in the last 72 hours.   Weights: Filed Weights   12/13/14 0500 12/14/14 0541 12/14/14 0552  Weight: 117 lb 6.4 oz (53.252 kg) 162 lb 11.2 oz (73.8 kg) 168 lb 6.4 oz (76.386 kg)     Radiology/Studies:  US Renal  12/10/2014  CLINICAL DATA:  Hydronephrosis and bilateral calculi. Bilateral kidney stones removed last night. Gross hematuria since yesterday. EXAM: RENAL / URINARY TRACT ULTRASOUND COMPLETE COMPARISON:  CT of the abdomen and pelvis on 12/09/2014 FINDINGS: Right Kidney: Length: 10.8 cm. Multiple small cysts are present. Largest is 1.2 x 1.1 x 1.2 cm. Small intrarenal stones are identified, too small to measure. Left Kidney: Length: 12.3 cm. Mild hydronephrosis. Small cysts are identified. Largest in the lower pole region measures 2.7 x  2.7 x 2.2 cm. Small intrarenal stones are noted, too small to measure. Bladder: Foley catheter decompresses the bladder. IMPRESSION: 1. Mild left hydronephrosis. 2. Bilateral cysts. 3. Small intrarenal calculi. Electronically Signed   By: Norva PavlovElizabeth  Brown M.D.   On: 12/10/2014 11:36   Ct Renal Stone Study  12/09/2014  CLINICAL DATA:  Back and abdominal pain for 1 week. Worsening tonight with  vomiting and nausea. EXAM: CT ABDOMEN AND PELVIS WITHOUT CONTRAST TECHNIQUE: Multidetector CT imaging of the abdomen and pelvis was performed following the standard protocol without IV contrast. COMPARISON:  CT 07/25/2013 FINDINGS: Lower chest: Linear atelectasis in the left lower lobe. The heart is mildly enlarged. Liver: No evidence focal lesion allowing for lack contrast. Hepatobiliary: Gallbladder physiologically distended, no calcified stone. No evidence of biliary dilatation. Pancreas: No ductal dilatation or surrounding inflammation. Spleen: Normal. Adrenal glands: No nodule.  Mild fullness bilaterally. Kidneys: There is moderate bilateral hydroureteronephrosis and perinephric strandy. On the right this secondary to a 11 x 9 mm stone in the right mid ureter at the level of L3-L4. On the left this is secondary to 9 x 8 mm stone in the left mid distal ureter at the level of L5-S1. Additional nonobstructing stones in both kidneys. There is a cyst in the lower left kidney, additional small bilateral renal cysts and not well seen. Stomach/Bowel: Stomach mildly distended with ingested contents. Small hiatal hernia. There are no dilated or thickened small bowel loops. Small volume of stool throughout the colon without colonic wall thickening. The colon is tortuous. The appendix is normal. Vascular/Lymphatic: No retroperitoneal adenopathy. Abdominal aorta is normal in caliber. Moderate to dense atherosclerosis without aneurysm. Reproductive: Prostate gland is enlarged measuring 5.4 cm transverse dimension. Bladder: Minimally distended.  No wall thickening. Other: No free air, free fluid, or intra-abdominal fluid collection. Post left inguinal hernia repair with mesh. There is fat in the right inguinal canal. Musculoskeletal: There are no acute or suspicious osseous abnormalities. Stable degenerative change in the spine. Stable mild compression deformity superior endplate of L1. IMPRESSION: 1. Bilateral obstructive  uropathy with moderate bilateral hydroureteronephrosis. Large mid ureteral obstructing stones, 11 mm on the right and 9 mm on the left. Recommend urologic consultation. 2. Additional nonobstructing stones in both kidneys. Electronically Signed   By: Rubye OaksMelanie  Ehinger M.D.   On: 12/09/2014 23:26     Assessment and Recommendation  81 y.o. male with chronic nonvalvular atrial fibrillation review sling on appropriate medication management with reasonable heart rate control at this time having significant pain from previous surgery likely causing variability of heart rate and short nonsustained wide-complex tachycardia now resolved. Patient is very stable from the cardiac standpoint 1. Continue metoprolol for wide-complex tachycardia as well as atrial fibrillation without change today 2. Begin ambulation and follow for improvements of pain which is likely contributing to faster heart rate 3. Continue anticoagulation for further risk reduction in stroke with atrial fibrillation watching for worsening hematuria 4. Further adjustments of medications as necessary depending on telemetry changes with ambulation 5. No further cardiac diagnostics necessary at this time 6. Okay for discharge to home from cardiac standpoint with follow-up next week for adjustments of medication management  Signed, Arnoldo HookerBruce Kowalski M.D. FACC

## 2014-12-14 NOTE — Care Management (Signed)
For discharge.  No home health needs

## 2014-12-14 NOTE — Care Management Important Message (Signed)
Important Message  Patient Details  Name: William PigeonJames B Hemrick Sr. MRN: [id number] Date of Birth: 06/08/1932   Medicare Important Message Given:  Yes-third notification given    William Lozano 12/14/2014, 9:29 AM

## 2014-12-15 LAB — CULTURE, BLOOD (ROUTINE X 2)
CULTURE: NO GROWTH
Culture: NO GROWTH

## 2014-12-16 NOTE — Telephone Encounter (Signed)
Notified pt of appt on 11/4 @4 :15 with Dr. Sherryl BartersBudzyn. Pt voices understanding.

## 2014-12-25 ENCOUNTER — Encounter: Payer: Self-pay | Admitting: Urology

## 2014-12-25 ENCOUNTER — Ambulatory Visit: Payer: Self-pay | Admitting: Urology

## 2014-12-25 ENCOUNTER — Ambulatory Visit (INDEPENDENT_AMBULATORY_CARE_PROVIDER_SITE_OTHER): Payer: Medicare Other | Admitting: Urology

## 2014-12-25 VITALS — BP 115/75 | HR 99 | Ht 66.0 in | Wt 138.2 lb

## 2014-12-25 DIAGNOSIS — R350 Frequency of micturition: Secondary | ICD-10-CM

## 2014-12-25 DIAGNOSIS — N200 Calculus of kidney: Secondary | ICD-10-CM | POA: Diagnosis not present

## 2014-12-25 LAB — BLADDER SCAN AMB NON-IMAGING

## 2014-12-25 LAB — URINALYSIS, COMPLETE
Bilirubin, UA: NEGATIVE
Glucose, UA: NEGATIVE
KETONES UA: NEGATIVE
NITRITE UA: NEGATIVE
SPEC GRAV UA: 1.02 (ref 1.005–1.030)
Urobilinogen, Ur: 1 mg/dL (ref 0.2–1.0)
pH, UA: 5 (ref 5.0–7.5)

## 2014-12-25 LAB — MICROSCOPIC EXAMINATION: Renal Epithel, UA: NONE SEEN /hpf

## 2014-12-25 NOTE — Progress Notes (Addendum)
12/25/2014 12:30 PM   William PigeonJames B Delamar Sr. 10/30/1932 [id number]  Referring provider: Marguarite ArbourJeffrey D Sparks, MD [address] [facility] [place], [place] [address]  Chief Complaint  Patient presents with  . Nephrolithiasis    2 week follow up to discuss management    HPI: The patient is an 81 year old male who follows up after having emergent stent placement for bilateral obstructing ureteral stones and urosepsis. He has done better since the surgery. Though he complains of frequency and dysuria. Urine and blood cultures came back negative, however during the time of surgery, he was seen to have purulent discharge from his left ureteral orifice. The patient is doing much better now. He is adamant that he does not want to have a repeat cystoscopy at this time as he did not like how it felt after his last surgery. PMH: Past Medical History  Diagnosis Date  . Essential hypertension   . Atrial fibrillation (HCC)   . Kidney stones   . Hyperlipemia     Surgical History: Past Surgical History  Procedure Laterality Date  . Hernia repair Bilateral     inguinal  . Cystoscopy with stent placement Bilateral 12/10/2014    Procedure: CYSTOSCOPY WITH STENT PLACEMENT;  Surgeon: Hildred LaserBrian Estus Avleen Bordwell, MD;  Location: [facility] ORS;  Service: Urology;  Laterality: Bilateral;  . Cystoscopy with retrograde pyelogram, ureteroscopy and stent placement Bilateral 12/10/2014    Procedure: CYSTOSCOPY WITH RETROGRADE PYELOGRAM, URETEROSCOPY AND STENT PLACEMENT;  Surgeon: Hildred LaserBrian Michio Ashlye Oviedo, MD;  Location: [facility];  Service: Urology;  Laterality: Bilateral;    Home Medications:    Medication List       This list is accurate as of: 12/25/14 12:30 PM.  Always use your most recent med list.               apixaban 5 MG Tabs tablet  Commonly known as:  ELIQUIS  Take 5 mg by mouth 2 (two) times daily.     cefUROXime 250 MG tablet  Commonly known as:  CEFTIN  Take 1 tablet (250 mg total) by mouth 2  (two) times daily with a meal.     CIALIS 5 MG tablet  Generic drug:  tadalafil  TK 1 T PO Q DAY     hydrochlorothiazide 25 MG tablet  Commonly known as:  HYDRODIURIL  Take 1 tablet (25 mg total) by mouth daily as needed.     HYDROcodone-acetaminophen 5-325 MG tablet  Commonly known as:  NORCO/VICODIN  Take 1 tablet by mouth every 4 (four) hours as needed for moderate pain.     metoprolol succinate 25 MG 24 hr tablet  Commonly known as:  TOPROL-XL  Take 25 mg by mouth daily.     ondansetron 4 MG tablet  Commonly known as:  ZOFRAN  Take 1 tablet (4 mg total) by mouth every 6 (six) hours as needed for nausea.     pantoprazole 40 MG tablet  Commonly known as:  PROTONIX  Take 40 mg by mouth daily.     promethazine 12.5 MG tablet  Commonly known as:  PHENERGAN  Take 12.5 mg by mouth every 6 (six) hours as needed for nausea or vomiting.     simvastatin 20 MG tablet  Commonly known as:  ZOCOR  Take 20 mg by mouth daily.     SM LORATADINE 5 MG/5ML syrup  Generic drug:  loratadine  Take by mouth.        Allergies:  Allergies  Allergen  Reactions  . Codeine Other (See Comments)    Causes side effects  . Diazepam Other (See Comments)  . Ibuprofen Other (See Comments)  . Penicillin G Other (See Comments)  . Sulfa Antibiotics Rash    Family History: Family History  Problem Relation Age of Onset  . Hypertension    . Kidney disease Neg Hx   . Prostate cancer Neg Hx     Social History:  reports that he has never smoked. He does not have any smokeless tobacco history on file. He reports that he does not drink alcohol or use illicit drugs.  ROS:                                        Physical Exam: BP 115/75 mmHg  Pulse 99  Ht  (1.676 m)  Wt 138 lb 3.2 oz (62.687 kg)  BMI 22.32 kg/m2  Constitutional:  Alert and oriented, No acute distress. HEENT: [place] AT, moist mucus membranes.  Trachea midline, no masses. Cardiovascular: No clubbing,  cyanosis, or edema. Respiratory: Normal respiratory effort, no increased work of breathing. GI: Abdomen is soft, nontender, nondistended, no abdominal masses GU: No CVA tenderness.  Skin: No rashes, bruises or suspicious lesions. Lymph: No cervical or inguinal adenopathy. Neurologic: Grossly intact, no focal deficits, moving all 4 extremities. Psychiatric: Normal mood and affect.  Laboratory Data: Lab Results  Component Value Date   WBC 10.8* 12/14/2014   HGB 12.3* 12/14/2014   HCT 37.3* 12/14/2014   MCV 91.4 12/14/2014   PLT 281 12/14/2014    Lab Results  Component Value Date   CREATININE 1.27* 12/14/2014    No results found for: PSA  No results found for: TESTOSTERONE  No results found for: HGBA1C  Urinalysis    Component Value Date/Time   COLORURINE YELLOW* 12/09/2014 2144   APPEARANCEUR CLOUDY* 12/09/2014 2144   LABSPEC 1.018 12/09/2014 2144   PHURINE 5.0 12/09/2014 2144   GLUCOSEU NEGATIVE 12/09/2014 2144   HGBUR 3+* 12/09/2014 2144   BILIRUBINUR NEGATIVE 12/09/2014 2144   KETONESUR NEGATIVE 12/09/2014 2144   PROTEINUR NEGATIVE 12/09/2014 2144   NITRITE NEGATIVE 12/09/2014 2144   LEUKOCYTESUR 1+* 12/09/2014 2144    PVR: 95   Assessment & Plan:    1. Nephrolithiasis The patient has bilateral obstructing ureteral stones status post emergent bilateral ureteral stent placement for AKI and urosepsis. He is now due for definitive management of his stones. I discussed the treatment options with the patient including bilateral ureteroscopy versus lithotripsy. He has had lithotripsy many times in the past with great success. I discussed with him though that lithotripsy would require 2 separate surgeries. I did tell them that there was a chance of being stone free with just one surgery if we did the bilateral ureteroscopy. He is not interested though in ureteroscopy at this time and would rather have 2 lithotripsies. He is also aware that he has a nonobstructing stone  in his right lower pole of his kidney that will not be addressed with lithotripsy at this time. He is agreeable to this treatment plan. He will need clearance to come off his Eliquis prior to these procedures. The patient was also given Myrbetriq 50 mg samples to take once daily for his irritative symptoms with stents.   Return for lithotripsy.  Hildred Laser, MD  [facility] [address] [place], [place] [phone]  [phone]  

## 2014-12-27 LAB — CULTURE, URINE COMPREHENSIVE

## 2014-12-28 ENCOUNTER — Telehealth: Payer: Self-pay | Admitting: Radiology

## 2014-12-28 NOTE — Telephone Encounter (Signed)
LMOM for pt to return call regarding ESWL scheduled 11/17 & 01/21/15.

## 2014-12-29 ENCOUNTER — Telehealth: Payer: Self-pay

## 2014-12-29 ENCOUNTER — Encounter
Admission: RE | Admit: 2014-12-29 | Discharge: 2014-12-29 | Disposition: A | Discharge status: Home / Self Care | Payer: Medicare Other | Source: Ambulatory Visit | Attending: Urology | Admitting: Urology

## 2014-12-29 ENCOUNTER — Telehealth: Payer: Self-pay | Admitting: Urology

## 2014-12-29 DIAGNOSIS — Z01818 Encounter for other preprocedural examination: Secondary | ICD-10-CM | POA: Insufficient documentation

## 2014-12-29 DIAGNOSIS — N390 Urinary tract infection, site not specified: Secondary | ICD-10-CM

## 2014-12-29 MED ORDER — NITROFURANTOIN MONOHYD MACRO 100 MG PO CAPS: 100.0000 mg | ORAL_CAPSULE | Freq: Four times a day (QID) | ORAL | Status: AC

## 2014-12-29 NOTE — Telephone Encounter (Signed)
Please advise 

## 2014-12-29 NOTE — Telephone Encounter (Signed)
Notified pt that he should hold Eliquis 48 hrs prior to both ESWL procedures scheduled 01/07/15 & 01/21/15. Also reiterated instructions given in office that he should use a laxative & fleets enema day prior to each procedure, arrival times for procedures and that he should be npo after mn day of each procedure.  Pt voices understanding. Also mailed instructions to pt.

## 2014-12-29 NOTE — OR Nursing (Signed)
PREOP EKG FAXED TO DR PARASCHOS AND CALLED TO AMY AT DR HART OFFICE

## 2014-12-29 NOTE — Telephone Encounter (Signed)
Please call [facility] pharmacy [phone].  Received RX for Macrobid 1 cap every 6 hours.  Pharmacist states this is the usual dosage for Macrodantin.  Did the doctor mean Macrobid or Macrodantin?  Please call.

## 2014-12-29 NOTE — Telephone Encounter (Signed)
Message     Mr. William Lozano needs to be on nitrofurantoin 100 mg q6h for 7 days. He will need a repeat urine culture prior to his lithotripsy to ensure infection is cleared.   Spoke with pt in reference to infection. Pt will RTC when abx are completed for another ucx. Medication called into pharmacy.

## 2014-12-29 NOTE — OR Nursing (Signed)
ekg ok per Dr Darrold JunkerParaschos

## 2014-12-30 NOTE — Telephone Encounter (Signed)
[facility].

## 2014-12-30 NOTE — Telephone Encounter (Signed)
Macrodantin 100 mg q6h for one week

## 2015-01-06 ENCOUNTER — Encounter: Payer: Self-pay | Admitting: *Deleted

## 2015-01-07 ENCOUNTER — Encounter
Admission: RE | Disposition: A | Discharge status: Home / Self Care | Payer: Self-pay | Source: Ambulatory Visit | Attending: Urology

## 2015-01-07 ENCOUNTER — Other Ambulatory Visit: Payer: Self-pay | Admitting: Urology

## 2015-01-07 ENCOUNTER — Ambulatory Visit
Admission: RE | Admit: 2015-01-07 | Discharge: 2015-01-07 | Disposition: A | Discharge status: Home / Self Care | Payer: Medicare Other | Source: Ambulatory Visit | Attending: Urology | Admitting: Urology

## 2015-01-07 DIAGNOSIS — Z833 Family history of diabetes mellitus: Secondary | ICD-10-CM | POA: Diagnosis not present

## 2015-01-07 DIAGNOSIS — G309 Alzheimer's disease, unspecified: Secondary | ICD-10-CM | POA: Insufficient documentation

## 2015-01-07 DIAGNOSIS — N200 Calculus of kidney: Secondary | ICD-10-CM | POA: Insufficient documentation

## 2015-01-07 DIAGNOSIS — I495 Sick sinus syndrome: Secondary | ICD-10-CM | POA: Insufficient documentation

## 2015-01-07 DIAGNOSIS — F0280 Dementia in other diseases classified elsewhere without behavioral disturbance: Secondary | ICD-10-CM | POA: Insufficient documentation

## 2015-01-07 DIAGNOSIS — Z882 Allergy status to sulfonamides status: Secondary | ICD-10-CM | POA: Insufficient documentation

## 2015-01-07 DIAGNOSIS — Z8249 Family history of ischemic heart disease and other diseases of the circulatory system: Secondary | ICD-10-CM | POA: Diagnosis not present

## 2015-01-07 DIAGNOSIS — Z880 Allergy status to penicillin: Secondary | ICD-10-CM | POA: Insufficient documentation

## 2015-01-07 DIAGNOSIS — Z888 Allergy status to other drugs, medicaments and biological substances status: Secondary | ICD-10-CM | POA: Diagnosis not present

## 2015-01-07 DIAGNOSIS — I480 Paroxysmal atrial fibrillation: Secondary | ICD-10-CM | POA: Insufficient documentation

## 2015-01-07 DIAGNOSIS — I10 Essential (primary) hypertension: Secondary | ICD-10-CM | POA: Insufficient documentation

## 2015-01-07 DIAGNOSIS — E119 Type 2 diabetes mellitus without complications: Secondary | ICD-10-CM | POA: Diagnosis not present

## 2015-01-07 DIAGNOSIS — M1990 Unspecified osteoarthritis, unspecified site: Secondary | ICD-10-CM | POA: Diagnosis not present

## 2015-01-07 DIAGNOSIS — Z79899 Other long term (current) drug therapy: Secondary | ICD-10-CM | POA: Insufficient documentation

## 2015-01-07 DIAGNOSIS — Z87442 Personal history of urinary calculi: Secondary | ICD-10-CM | POA: Insufficient documentation

## 2015-01-07 DIAGNOSIS — Z885 Allergy status to narcotic agent status: Secondary | ICD-10-CM | POA: Insufficient documentation

## 2015-01-07 DIAGNOSIS — E785 Hyperlipidemia, unspecified: Secondary | ICD-10-CM | POA: Diagnosis not present

## 2015-01-07 HISTORY — PX: EXTRACORPOREAL SHOCK WAVE LITHOTRIPSY: SHX1557

## 2015-01-07 HISTORY — DX: Unspecified dementia, unspecified severity, without behavioral disturbance, psychotic disturbance, mood disturbance, and anxiety: F03.90

## 2015-01-07 LAB — GLUCOSE, CAPILLARY: Glucose-Capillary: 87 mg/dL (ref 65–99)

## 2015-01-07 LAB — PROTIME-INR
INR: 1.2
PROTHROMBIN TIME: 15.4 s — AB (ref 11.4–15.0)

## 2015-01-07 SURGERY — LITHOTRIPSY, ESWL
Anesthesia: Moderate Sedation | Laterality: Right

## 2015-01-07 MED ORDER — ONDANSETRON HCL 4 MG/2ML IJ SOLN
INTRAMUSCULAR | Status: AC
  Administered 2015-01-07: 4 mg via INTRAVENOUS
  Filled 2015-01-07: qty 2

## 2015-01-07 MED ORDER — DIPHENHYDRAMINE HCL 25 MG PO CAPS
25.0000 mg | ORAL_CAPSULE | ORAL | Status: AC
  Administered 2015-01-07: 25 mg via ORAL

## 2015-01-07 MED ORDER — CIPROFLOXACIN HCL 500 MG PO TABS
ORAL_TABLET | ORAL | Status: AC
  Administered 2015-01-07: 500 mg via ORAL
  Filled 2015-01-07: qty 1

## 2015-01-07 MED ORDER — DEXTROSE-NACL 5-0.45 % IV SOLN: INTRAVENOUS | Status: DC

## 2015-01-07 MED ORDER — CIPROFLOXACIN HCL 500 MG PO TABS
500.0000 mg | ORAL_TABLET | ORAL | Status: AC
  Administered 2015-01-07: 500 mg via ORAL

## 2015-01-07 MED ORDER — ONDANSETRON HCL 4 MG/2ML IJ SOLN
4.0000 mg | Freq: Once | INTRAMUSCULAR | Status: AC
  Administered 2015-01-07: 4 mg via INTRAVENOUS

## 2015-01-07 MED ORDER — DIPHENHYDRAMINE HCL 25 MG PO CAPS
ORAL_CAPSULE | ORAL | Status: AC
  Administered 2015-01-07: 25 mg via ORAL
  Filled 2015-01-07: qty 1

## 2015-01-07 MED ORDER — METOPROLOL SUCCINATE ER 25 MG PO TB24
25.0000 mg | ORAL_TABLET | Freq: Every day | ORAL | Status: DC
  Administered 2015-01-07: 25 mg via ORAL
  Filled 2015-01-07 (×2): qty 1

## 2015-01-08 ENCOUNTER — Encounter: Payer: Self-pay | Admitting: Urology

## 2015-01-18 ENCOUNTER — Telehealth: Payer: Self-pay | Admitting: Radiology

## 2015-01-18 NOTE — Telephone Encounter (Signed)
Pt went to pre-admit testing to ask questions concerning ESWL scheduled 12/1. Per Dr Sherryl BartersBudzyn pt should have Left ESWL on 12/1 and r/s follow up for 2 weeks post procedure. Pt notified of follow up appt scheduled 02/04/15 @9 :15 and reinforced instructions for ESWL scheduled 12/1. [facility] 11:30 on 12/1, be npo after mn, he will need a driver, use Dulcolax day prior and Fleets enema morning of procedure as he did for his previous Right ESWL on 11/17. Pt voices understanding.

## 2015-01-19 ENCOUNTER — Telehealth: Payer: Self-pay | Admitting: Urology

## 2015-01-19 MED ORDER — BUPIVACAINE-EPINEPHRINE (PF) 0.5% -1:200000 IJ SOLN
INTRAMUSCULAR | Status: AC
  Filled 2015-01-19: qty 30

## 2015-01-19 MED ORDER — ACETAMINOPHEN 10 MG/ML IV SOLN
INTRAVENOUS | Status: AC
  Filled 2015-01-19: qty 100

## 2015-01-19 NOTE — Telephone Encounter (Signed)
Got a [facility] and she stated that you ordered valium for his litho on Thursday and he has an allergy to this. They want you to call them back either today or tomorrow to let them know what you want to do about it. Please call her or Debbie at [phone]  Thanks, William DusterMichelle

## 2015-01-19 NOTE — Progress Notes (Signed)
Upon placing orders in computer for lithotripsy for 12/1. Valium was marked on order form and note appeared that patient had allergy to valium. RN called office and left message to check with Dr. Sherryl BartersBudzyn when he comes to office tomorrow. Informed to call back with MD orders after speaking with him.

## 2015-01-20 MED ORDER — TRANEXAMIC ACID 1000 MG/10ML IV SOLN
INTRAVENOUS | Status: AC
  Filled 2015-01-20: qty 10

## 2015-01-21 ENCOUNTER — Ambulatory Visit: Payer: Medicare Other

## 2015-01-21 ENCOUNTER — Ambulatory Visit
Admission: RE | Admit: 2015-01-21 | Discharge: 2015-01-21 | Disposition: A | Discharge status: Home / Self Care | Payer: Medicare Other | Source: Ambulatory Visit | Attending: Urology | Admitting: Urology

## 2015-01-21 ENCOUNTER — Ambulatory Visit: Payer: Medicare Other | Admitting: Obstetrics and Gynecology

## 2015-01-21 ENCOUNTER — Encounter
Admission: RE | Disposition: A | Discharge status: Home / Self Care | Payer: Self-pay | Source: Ambulatory Visit | Attending: Urology

## 2015-01-21 ENCOUNTER — Encounter: Payer: Self-pay | Admitting: *Deleted

## 2015-01-21 DIAGNOSIS — E785 Hyperlipidemia, unspecified: Secondary | ICD-10-CM | POA: Insufficient documentation

## 2015-01-21 DIAGNOSIS — Z888 Allergy status to other drugs, medicaments and biological substances status: Secondary | ICD-10-CM | POA: Insufficient documentation

## 2015-01-21 DIAGNOSIS — N201 Calculus of ureter: Secondary | ICD-10-CM | POA: Diagnosis not present

## 2015-01-21 DIAGNOSIS — Z885 Allergy status to narcotic agent status: Secondary | ICD-10-CM | POA: Diagnosis not present

## 2015-01-21 DIAGNOSIS — I10 Essential (primary) hypertension: Secondary | ICD-10-CM | POA: Insufficient documentation

## 2015-01-21 DIAGNOSIS — Z7901 Long term (current) use of anticoagulants: Secondary | ICD-10-CM | POA: Diagnosis not present

## 2015-01-21 DIAGNOSIS — I4891 Unspecified atrial fibrillation: Secondary | ICD-10-CM | POA: Insufficient documentation

## 2015-01-21 DIAGNOSIS — N200 Calculus of kidney: Secondary | ICD-10-CM

## 2015-01-21 HISTORY — PX: EXTRACORPOREAL SHOCK WAVE LITHOTRIPSY: SHX1557

## 2015-01-21 LAB — GLUCOSE, CAPILLARY: Glucose-Capillary: 106 mg/dL — ABNORMAL HIGH (ref 65–99)

## 2015-01-21 SURGERY — LITHOTRIPSY, ESWL
Anesthesia: Moderate Sedation | Laterality: Left

## 2015-01-21 MED ORDER — GENTAMICIN IN SALINE 1.6-0.9 MG/ML-% IV SOLN
80.0000 mg | Freq: Once | INTRAVENOUS | Status: AC
  Administered 2015-01-21: 80 mg via INTRAVENOUS
  Filled 2015-01-21: qty 50

## 2015-01-21 MED ORDER — ONDANSETRON HCL 4 MG/5ML PO SOLN: 4.0000 mg | Freq: Once | ORAL | Status: DC

## 2015-01-21 MED ORDER — ONDANSETRON HCL 4 MG/2ML IJ SOLN
4.0000 mg | Freq: Once | INTRAMUSCULAR | Status: AC
  Administered 2015-01-21: 4 mg via INTRAVENOUS

## 2015-01-21 MED ORDER — ONDANSETRON HCL 4 MG/2ML IJ SOLN
INTRAMUSCULAR | Status: AC
  Administered 2015-01-21: 4 mg
  Filled 2015-01-21: qty 2

## 2015-01-21 MED ORDER — DEXTROSE-NACL 5-0.45 % IV SOLN
INTRAVENOUS | Status: DC
  Administered 2015-01-21: 12:00:00 via INTRAVENOUS

## 2015-01-21 MED ORDER — CIPROFLOXACIN HCL 500 MG PO TABS
ORAL_TABLET | ORAL | Status: AC
  Filled 2015-01-21: qty 1

## 2015-01-21 MED ORDER — DIPHENHYDRAMINE HCL 25 MG PO CAPS
ORAL_CAPSULE | ORAL | Status: AC
  Filled 2015-01-21: qty 1

## 2015-01-21 MED ORDER — DIPHENHYDRAMINE HCL 25 MG PO CAPS
25.0000 mg | ORAL_CAPSULE | ORAL | Status: AC
  Administered 2015-01-21: 25 mg via ORAL

## 2015-01-21 MED ORDER — CIPROFLOXACIN HCL 500 MG PO TABS
500.0000 mg | ORAL_TABLET | Freq: Once | ORAL | Status: AC
  Administered 2015-01-21: 500 mg via ORAL

## 2015-01-21 NOTE — Discharge Instructions (Signed)
AMBULATORY SURGERY  °DISCHARGE INSTRUCTIONS ° ° °1) The drugs that you were given will stay in your system until tomorrow so for the next 24 hours you should not: ° °A) Drive an automobile °B) Make any legal decisions °C) Drink any alcoholic beverage ° ° °2) You may resume regular meals tomorrow.  Today it is better to start with liquids and gradually work up to solid foods. ° °You may eat anything you prefer, but it is better to start with liquids, then soup and crackers, and gradually work up to solid foods. ° ° °3) Please notify your doctor immediately if you have any unusual bleeding, trouble breathing, redness and pain at the surgery site, drainage, fever, or pain not relieved by medication. ° ° ° °4) Additional Instructions: ° ° ° ° ° ° ° °Please contact your physician with any problems or Same Day Surgery at [phone], Monday through Friday 6 am to 4 pm, or [place] [facility] [place] Main number at [phone].AMBULATORY SURGERY  °DISCHARGE INSTRUCTIONS ° ° °5) The drugs that you were given will stay in your system until tomorrow so for the next 24 hours you should not: ° °D) Drive an automobile °E) Make any legal decisions °F) Drink any alcoholic beverage ° ° °6) You may resume regular meals tomorrow.  Today it is better to start with liquids and gradually work up to solid foods. ° °You may eat anything you prefer, but it is better to start with liquids, then soup and crackers, and gradually work up to solid foods. ° ° °7) Please notify your doctor immediately if you have any unusual bleeding, trouble breathing, redness and pain at the surgery site, drainage, fever, or pain not relieved by medication. ° ° ° °8) Additional Instructions: ° ° ° ° ° ° ° °Please contact your physician with any problems or Same Day Surgery at [phone], Monday through Friday 6 am to 4 pm, or [place] [facility] [place] Main number at [phone]. °

## 2015-01-21 NOTE — Progress Notes (Signed)
To KUB on arrival to preop

## 2015-01-22 ENCOUNTER — Encounter: Payer: Self-pay | Admitting: Urology

## 2015-01-25 ENCOUNTER — Telehealth: Payer: Self-pay | Admitting: Radiology

## 2015-01-25 ENCOUNTER — Other Ambulatory Visit: Payer: Self-pay | Admitting: *Deleted

## 2015-01-25 DIAGNOSIS — N200 Calculus of kidney: Secondary | ICD-10-CM

## 2015-01-25 NOTE — Telephone Encounter (Signed)
LMOM reminding pt to get KUB prior to f/u appt on 12/15 @9 :15

## 2015-02-02 NOTE — Telephone Encounter (Signed)
Confirmed appt with Dr Sherryl BartersBudzyn on 12/15 @9 :15. Advised pt to get KUB prior to appt. Pt voices understanding.

## 2015-02-04 ENCOUNTER — Encounter: Payer: Self-pay | Admitting: Urology

## 2015-02-04 ENCOUNTER — Ambulatory Visit
Admission: RE | Admit: 2015-02-04 | Discharge: 2015-02-04 | Disposition: A | Discharge status: Home / Self Care | Payer: Medicare Other | Source: Ambulatory Visit | Attending: Urology | Admitting: Urology

## 2015-02-04 ENCOUNTER — Ambulatory Visit (INDEPENDENT_AMBULATORY_CARE_PROVIDER_SITE_OTHER): Payer: Medicare Other | Admitting: Urology

## 2015-02-04 ENCOUNTER — Telehealth: Payer: Self-pay | Admitting: Radiology

## 2015-02-04 VITALS — BP 136/84 | HR 90 | Ht 66.0 in | Wt 150.7 lb

## 2015-02-04 DIAGNOSIS — I10 Essential (primary) hypertension: Secondary | ICD-10-CM | POA: Insufficient documentation

## 2015-02-04 DIAGNOSIS — N202 Calculus of kidney with calculus of ureter: Secondary | ICD-10-CM | POA: Diagnosis not present

## 2015-02-04 DIAGNOSIS — I459 Conduction disorder, unspecified: Secondary | ICD-10-CM | POA: Insufficient documentation

## 2015-02-04 DIAGNOSIS — Z87898 Personal history of other specified conditions: Secondary | ICD-10-CM | POA: Insufficient documentation

## 2015-02-04 DIAGNOSIS — E785 Hyperlipidemia, unspecified: Secondary | ICD-10-CM | POA: Insufficient documentation

## 2015-02-04 DIAGNOSIS — N200 Calculus of kidney: Secondary | ICD-10-CM | POA: Diagnosis present

## 2015-02-04 DIAGNOSIS — E119 Type 2 diabetes mellitus without complications: Secondary | ICD-10-CM | POA: Insufficient documentation

## 2015-02-04 DIAGNOSIS — B351 Tinea unguium: Secondary | ICD-10-CM | POA: Insufficient documentation

## 2015-02-04 DIAGNOSIS — K219 Gastro-esophageal reflux disease without esophagitis: Secondary | ICD-10-CM | POA: Insufficient documentation

## 2015-02-04 DIAGNOSIS — M1990 Unspecified osteoarthritis, unspecified site: Secondary | ICD-10-CM | POA: Insufficient documentation

## 2015-02-04 DIAGNOSIS — I4891 Unspecified atrial fibrillation: Secondary | ICD-10-CM | POA: Insufficient documentation

## 2015-02-04 DIAGNOSIS — I495 Sick sinus syndrome: Secondary | ICD-10-CM | POA: Insufficient documentation

## 2015-02-04 LAB — MICROSCOPIC EXAMINATION
RBC, UA: >30 /hpf — ABNORMAL HIGH (ref 0–?)
WBC UA: NONE SEEN /HPF (ref 0–?)

## 2015-02-04 LAB — URINALYSIS, COMPLETE

## 2015-02-04 NOTE — Progress Notes (Signed)
02/04/2015 9:28 AM   William Lozano. 04/22/1932 [id number]  Referring provider: Marguarite ArbourJeffrey D Sparks, MD [address] [facility] [place], [place] [id number]  Chief Complaint  Patient presents with  . Routine Post Op    f/u lithotripsy, KUB    HPI: The patient is an 81 year old male who follows up after having emergent stent placement for bilateral obstructing ureteral stones and urosepsis. He has done better since the surgery. Though he complains of frequency and dysuria. Urine and blood cultures came back negative, however during the time of surgery, he was seen to have purulent discharge from his left ureteral orifice. The patient is doing much better now. He is adamant that he does not want to have a repeat cystoscopy at this time as he did not like how it felt after his last surgery.  Interval history: The patient underwent bilateral ESWL in a stage fashion and returns today for follow-up. He has done well since his surgery and has no complaints this time. KUB today does show residual stone in both ureters bilaterally with the stents in place.   PMH: Past Medical History  Diagnosis Date  . Essential hypertension   . Atrial fibrillation (HCC) 2016    paroxysmal atrial fibrillation, sick sinus syndrome  . Kidney stones   . Hyperlipemia   . Diabetes mellitus without complication (HCC)     type 2  . Dementia     alzheimers    Surgical History: Past Surgical History  Procedure Laterality Date  . Hernia repair Bilateral     inguinal  . Cystoscopy with stent placement Bilateral 12/10/2014    Procedure: CYSTOSCOPY WITH STENT PLACEMENT;  Surgeon: William LaserBrian Ashdon Jalil Lorusso, MD;  Location: [facility];  Service: Urology;  Laterality: Bilateral;  . Cystoscopy with retrograde pyelogram, ureteroscopy and stent placement Bilateral 12/10/2014    Procedure: CYSTOSCOPY WITH RETROGRADE PYELOGRAM, URETEROSCOPY AND STENT PLACEMENT;  Surgeon: William LaserBrian Isaiahs Trevell Pariseau, MD;  Location: [facility] OR[facility];   Service: Urology;  Laterality: Bilateral;  . Extracorporeal shock wave lithotripsy Right 01/07/2015    Procedure: EXTRACORPOREAL SHOCK WAVE LITHOTRIPSY (ESWL);  Surgeon: William Laxichard D Hart, MD;  Location: [facility] ORS;  Service: Urology;  Laterality: Right;  . Extracorporeal shock wave lithotripsy Left 01/21/2015    Procedure: EXTRACORPOREAL SHOCK WAVE LITHOTRIPSY (ESWL);  Surgeon: William LaserBrian William Refugio Vandevoorde, MD;  Location: [facility] OR[facility];  Service: Urology;  Laterality: Left;    Home Medications:    Medication List       This list is accurate as of: 02/04/15  9:28 AM.  Always use your most recent med list.               apixaban 5 MG Tabs tablet  Commonly known as:  ELIQUIS  Take 5 mg by mouth 2 (two) times daily.     cefUROXime 250 MG tablet  Commonly known as:  CEFTIN  Take 1 tablet (250 mg total) by mouth 2 (two) times daily with a meal.     CIALIS 5 MG tablet  Generic drug:  tadalafil  TK 1 T PO Q DAY     ciprofloxacin 250 MG tablet  Commonly known as:  CIPRO  Reported on 02/04/2015     fluticasone 50 MCG/ACT nasal spray  Commonly known as:  FLONASE  Place 1 spray into both nostrils 2 (two) times daily. Reported on 02/04/2015     hydrochlorothiazide 25 MG tablet  Commonly known as:  HYDRODIURIL  Take 1 tablet (25 mg total) by mouth  daily as needed.     HYDROcodone-acetaminophen 5-325 MG tablet  Commonly known as:  NORCO/VICODIN  Take 1 tablet by mouth every 4 (four) hours as needed for moderate pain. Reported on 02/04/2015     metoprolol succinate 25 MG 24 hr tablet  Commonly known as:  TOPROL-XL  Take 25 mg by mouth daily.     ondansetron 4 MG tablet  Commonly known as:  ZOFRAN  Take 1 tablet (4 mg total) by mouth every 6 (six) hours as needed for nausea.     pantoprazole 40 MG tablet  Commonly known as:  PROTONIX  Take 40 mg by mouth daily.     promethazine 12.5 MG tablet  Commonly known as:  PHENERGAN  Take 12.5 mg by mouth every 6 (six) hours as needed for nausea or  vomiting. Reported on 02/04/2015     simvastatin 20 MG tablet  Commonly known as:  ZOCOR  Take 20 mg by mouth daily.     SM LORATADINE 5 MG/5ML syrup  Generic drug:  loratadine  Take by mouth. Reported on 02/04/2015        Allergies:  Allergies  Allergen Reactions  . Codeine Other (See Comments)    Causes side effects  . Diazepam Other (See Comments)  . Ibuprofen Other (See Comments)  . Penicillin G Other (See Comments)  . Sulfa Antibiotics Rash    Family History: Family History  Problem Relation Age of Onset  . Hypertension    . Kidney disease Neg Hx   . Prostate cancer Neg Hx     Social History:  reports that he has never smoked. He does not have any smokeless tobacco history on file. He reports that he does not drink alcohol or use illicit drugs.  ROS: UROLOGY Frequent Urination?: Yes Hard to postpone urination?: No Burning/pain with urination?: No Get up at night to urinate?: No Leakage of urine?: No Urine stream starts and stops?: No Trouble starting stream?: No Do you have to strain to urinate?: No Blood in urine?: No Urinary tract infection?: No Sexually transmitted disease?: No Injury to kidneys or bladder?: No Painful intercourse?: No Weak stream?: No Erection problems?: No Penile pain?: No  Gastrointestinal Nausea?: No Vomiting?: No Indigestion/heartburn?: No Diarrhea?: No Constipation?: No  Constitutional Fever: No Night sweats?: No Weight loss?: No Fatigue?: No  Skin Skin rash/lesions?: No Itching?: No  Eyes Blurred vision?: No Double vision?: No  Ears/Nose/Throat Sore throat?: No Sinus problems?: No  Hematologic/Lymphatic Swollen glands?: No Easy bruising?: No  Cardiovascular Leg swelling?: No Chest pain?: No  Respiratory Cough?: No Shortness of breath?: No  Endocrine Excessive thirst?: No  Musculoskeletal Back pain?: No Joint pain?: No  Neurological Headaches?: No Dizziness?:  No  Psychologic Depression?: No Anxiety?: No  Physical Exam: BP 136/84 mmHg  Pulse 90  Ht  (1.676 m)  Wt 150 lb 11.2 oz (68.357 kg)  BMI 24.34 kg/m2  Constitutional:  Alert and oriented, No acute distress. HEENT: [place] AT, moist mucus membranes.  Trachea midline, no masses. Cardiovascular: No clubbing, cyanosis, or edema. Respiratory: Normal respiratory effort, no increased work of breathing. GI: Abdomen is soft, nontender, nondistended, no abdominal masses GU: No CVA tenderness.  Skin: No rashes, bruises or suspicious lesions. Lymph: No cervical or inguinal adenopathy. Neurologic: Grossly intact, no focal deficits, moving all 4 extremities. Psychiatric: Normal mood and affect.  Laboratory Data: Lab Results  Component Value Date   WBC 10.8* 12/14/2014   HGB 12.3* 12/14/2014   HCT 37.3*  12/14/2014   MCV 91.4 12/14/2014   PLT 281 12/14/2014    Lab Results  Component Value Date   CREATININE 1.27* 12/14/2014    No results found for: PSA  No results found for: TESTOSTERONE  No results found for: HGBA1C  Urinalysis    Component Value Date/Time   COLORURINE YELLOW* 12/09/2014 2144   APPEARANCEUR CLOUDY* 12/09/2014 2144   LABSPEC 1.018 12/09/2014 2144   PHURINE 5.0 12/09/2014 2144   GLUCOSEU Negative 12/25/2014 1153   HGBUR 3+* 12/09/2014 2144   BILIRUBINUR Negative 12/25/2014 1153   BILIRUBINUR NEGATIVE 12/09/2014 2144   KETONESUR NEGATIVE 12/09/2014 2144   PROTEINUR NEGATIVE 12/09/2014 2144   NITRITE Negative 12/25/2014 1153   NITRITE NEGATIVE 12/09/2014 2144   LEUKOCYTESUR Trace* 12/25/2014 1153   LEUKOCYTESUR 1+* 12/09/2014 2144    Pertinent Imaging: CLINICAL DATA: Kidney stones post lithotripsy 2 weeks ago, having hematuria and frequent urination, no pain today, diabetes mellitus, hypertension  EXAM: ABDOMEN - 1 VIEW  COMPARISON: 01/21/2015  FINDINGS: BILATERAL ureteral stents identified, unchanged.  Tiny BILATERAL nonobstructing renal  calculi.  Additionally, calcifications are seen adjacent to the LEFT ureteral stent, measuring 8 mm diameter at L5 and 3 mm diameter at the level of the inferior SI joint compatible with LEFT ureteral calculi.  Multiple calcifications are seen along the course of the proximal to mid RIGHT ureteral stent.  Bowel gas pattern normal.  Few pelvic phleboliths.  Bones demineralized with scoliosis and degenerative changes thoracolumbar spine.  Question prior LEFT inguinal herniorrhaphy.  IMPRESSION: BILATERAL ureteral stents.  Persistent visualization of BILATERAL ureteral calculi adjacent to the stents as above.  Additional tiny BILATERAL nonobstructing renal calculi.  Assessment & Plan:    1. Bilateral ureteral stones status post stents, staged lithotripsy I discussed the patient that he still has large volume of residual stone fragment bilaterally. He remains hesitant undergoing ureteroscopy which is why he underwent staged lithotripsy in the first place. I explained to him that with a large fragments in both ureters it would be unsafe to remove the stent to this time. He is not a good candidate for additional lithotripsy at this time. After much discussion, the patient agreeable to proceeding with bilateral ureteroscopy and laser lithotripsy. I discussed the risks, limits, and indications of this procedure. He understands the risks include, but are not limited to, bleeding, infection, and iatrogenic injury. He is agreeable to proceeding.   No Follow-up on file.  William Laser, MD  [facility] [address], [address] [place], [place] [address] [phone]

## 2015-02-04 NOTE — Telephone Encounter (Signed)
Notified Polly of surgery scheduled on 02/24/15, pre-admit testing appt on 02/12/15 @9 :00 and to call day before surgery for arrival time to SDS. Polly voices understanding & will relay message to pt.

## 2015-02-07 LAB — CULTURE, URINE COMPREHENSIVE

## 2015-02-08 ENCOUNTER — Telehealth: Payer: Self-pay | Admitting: Radiology

## 2015-02-08 NOTE — Telephone Encounter (Signed)
Notified pt that per Dr Darrold JunkerParaschos he should hold Eliquis 2 days prior to surgery with last dose being taken on 02/21/15. Pt voices understanding.

## 2015-02-10 ENCOUNTER — Telehealth: Payer: Self-pay

## 2015-02-10 DIAGNOSIS — N390 Urinary tract infection, site not specified: Secondary | ICD-10-CM

## 2015-02-10 MED ORDER — NITROFURANTOIN MONOHYD MACRO 100 MG PO CAPS: 100.0000 mg | ORAL_CAPSULE | Freq: Two times a day (BID) | ORAL | Status: AC

## 2015-02-10 NOTE — Telephone Encounter (Signed)
-----   Message from Hildred LaserBrian Jonty Budzyn, MD sent at 02/10/2015  9:15 AM EST ----- Can we start William Lozano on Macrobid 100 mg q12 hr for a week.  Can we bring him in for a repeat urine culture sometime in the middle of next week to confirm he cleared his infection.  He has surgery scheduled February 24, 2015. Thanks.

## 2015-02-10 NOTE — Telephone Encounter (Signed)
Spoke with pt in reference +ucx. Pt voiced understanding. Pt will RTC 02/18/15 for another ucx prior to surgery.

## 2015-02-11 ENCOUNTER — Other Ambulatory Visit: Payer: Self-pay

## 2015-02-11 ENCOUNTER — Telehealth: Payer: Self-pay

## 2015-02-11 DIAGNOSIS — N390 Urinary tract infection, site not specified: Secondary | ICD-10-CM

## 2015-02-11 NOTE — Telephone Encounter (Signed)
Pt called c/o an allergic reaction to macrobid. Pt states he has hives, itching, increased frequency, and bright red backside. Please advise.

## 2015-02-11 NOTE — Telephone Encounter (Signed)
He will need a picc line and ertapenem as this is the only remaining option to treat his UTI. Will likely need to reschedule surgery.

## 2015-02-12 ENCOUNTER — Encounter
Admission: RE | Admit: 2015-02-12 | Discharge: 2015-02-12 | Disposition: A | Discharge status: Home / Self Care | Payer: Medicare Other | Source: Ambulatory Visit | Attending: Urology | Admitting: Urology

## 2015-02-12 DIAGNOSIS — N201 Calculus of ureter: Secondary | ICD-10-CM | POA: Diagnosis not present

## 2015-02-12 DIAGNOSIS — Z01812 Encounter for preprocedural laboratory examination: Secondary | ICD-10-CM | POA: Diagnosis not present

## 2015-02-12 HISTORY — DX: Unspecified osteoarthritis, unspecified site: M19.90

## 2015-02-12 LAB — CBC
HEMATOCRIT: 43 % (ref 40.0–52.0)
HEMOGLOBIN: 14.3 g/dL (ref 13.0–18.0)
MCH: 29.9 pg (ref 26.0–34.0)
MCHC: 33.1 g/dL (ref 32.0–36.0)
MCV: 90.2 fL (ref 80.0–100.0)
PLATELETS: 230 10*3/uL (ref 150–440)
RBC: 4.77 MIL/uL (ref 4.40–5.90)
RDW: 14.6 % — ABNORMAL HIGH (ref 11.5–14.5)
WBC: 8.4 10*3/uL (ref 3.8–10.6)

## 2015-02-12 LAB — DIFFERENTIAL
BASOS PCT: 1 %
Basophils Absolute: 0.1 10*3/uL (ref 0–0.1)
EOS ABS: 0.5 10*3/uL (ref 0–0.7)
EOS PCT: 6 %
LYMPHS ABS: 1.2 10*3/uL (ref 1.0–3.6)
Lymphocytes Relative: 14 %
Monocytes Absolute: 0.8 10*3/uL (ref 0.2–1.0)
Monocytes Relative: 9 %
Neutro Abs: 5.9 10*3/uL (ref 1.4–6.5)
Neutrophils Relative %: 70 %

## 2015-02-12 LAB — BASIC METABOLIC PANEL
Anion gap: 6 (ref 5–15)
BUN: 24 mg/dL — ABNORMAL HIGH (ref 6–20)
CALCIUM: 9.8 mg/dL (ref 8.9–10.3)
CO2: 32 mmol/L (ref 22–32)
CREATININE: 1.23 mg/dL (ref 0.61–1.24)
Chloride: 103 mmol/L (ref 101–111)
GFR, EST NON AFRICAN AMERICAN: 54 mL/min — AB (ref >60)
GLUCOSE: 123 mg/dL — AB (ref 65–99)
Potassium: 3.5 mmol/L (ref 3.5–5.1)
Sodium: 141 mmol/L (ref 135–145)

## 2015-02-12 LAB — PROTIME-INR
INR: 1.4
Prothrombin Time: 17.3 seconds — ABNORMAL HIGH (ref 11.4–15.0)

## 2015-02-12 LAB — APTT: aPTT: 33 seconds (ref 24–36)

## 2015-02-12 NOTE — Patient Instructions (Addendum)
  Your procedure is scheduled on: February 24, 2015 (Wednesday) Report to Day Surgery.[facility] Second Floor To find out your arrival time please call [phone] between 1PM - 3PM on February 23, 2015 (Tuesday).  Remember: Instructions that are not followed completely may result in serious medical risk, up to and including death, or upon the discretion of your surgeon and anesthesiologist your surgery may need to be rescheduled.    __x__ 1. Do not eat food or drink liquids after midnight. No gum chewing or hard candies.     ____ 2. No Alcohol for 24 hours before or after surgery.   ____ 3. Bring all medications with you on the day of surgery if instructed.    __x__ 4. Notify your doctor if there is any change in your medical condition     (cold, fever, infections).     Do not wear jewelry, make-up, hairpins, clips or nail polish.  Do not wear lotions, powders, or perfumes. You may wear deodorant.  Do not shave 48 hours prior to surgery. Men may shave face and neck.  Do not bring valuables to the hospital.    [facility] is not responsible for any belongings or valuables.               Contacts, dentures or bridgework may not be worn into surgery.  Leave your suitcase in the car. After surgery it may be brought to your room.  For patients admitted to the hospital, discharge time is determined by your                treatment team.   Patients discharged the day of surgery will not be allowed to drive home.   Please read over the following fact sheets that you were given:   Surgical Site Infection Prevention   __x_ Take these medicines the morning of surgery with A SIP OF WATER:    1. Pantoprazole  (pantoprazole at bedtime on January 3)  2. Metoprolol  3.   4.  5.  6.  ____ Fleet Enema (as directed)   ____ Use CHG Soap as directed  ____ Use inhalers on the day of surgery  ____ Stop metformin 2 days prior to surgery    ____ Take 1/2 of usual insulin dose the night  before surgery and none on the morning of surgery.   _x___ Stop Coumadin/Plavix/aspirin on (STOP ELIQUIS 48  HOURS BEFORE SURGERY) (STOP ELIQUIS ON February 22, 2015)  ____ Stop Anti-inflammatories on    ____ Stop supplements until after surgery.    ____ Bring C-Pap to the hospital.

## 2015-02-16 MED ORDER — FERRIC SUBSULFATE 259 MG/GM EX SOLN
CUTANEOUS | Status: AC
  Filled 2015-02-16: qty 8

## 2015-02-16 NOTE — Telephone Encounter (Signed)
Per Gearldine BienenstockBrandy at Dr Jarrett AblesFitzgerald's office pt states he doesn't know why he was taken off of the macrobid and that he is still having diarrhea which he was having prior to starting the medication.  I explained that the pt had called c/o hives, itching, increased frequency and bright red backside after starting taking macrobid and that is when the medication was discontinued and a referral sent to Dr Sampson GoonFitzgerald.  Brandy states per Dr Jarrett AblesFitzgerald's orders pt had a ua and ucx at their office and was started on fosfomycin pending ucx results. Gearldine BienenstockBrandy states pt refused PICC placement and IV antibiotics & that Dr Sampson GoonFitzgerald plans to resume the macrobid if resistant to fosfomycin. Gearldine BienenstockBrandy will fax office notes regarding pt's appt with Dr. Sampson GoonFitzgerald.

## 2015-02-17 ENCOUNTER — Telehealth: Payer: Self-pay | Admitting: Radiology

## 2015-02-17 NOTE — Telephone Encounter (Signed)
Called to confirm when pt started taking fosfomycin, pt's friend Glena Norfolkolly states they will be able to get it from the pharmacy after 10am on 12/29. Per Dr Sherryl BartersBudzyn bring pt in on 02/23/15 for ua prior to surgery. Notified Polly of this & she verbalizes understanding.

## 2015-02-18 ENCOUNTER — Other Ambulatory Visit: Payer: Self-pay

## 2015-02-18 ENCOUNTER — Encounter: Payer: Self-pay | Admitting: Emergency Medicine

## 2015-02-18 ENCOUNTER — Emergency Department
Admission: EM | Admit: 2015-02-18 | Discharge: 2015-02-18 | Disposition: A | Discharge status: Home / Self Care | Payer: Medicare Other | Attending: Emergency Medicine | Admitting: Emergency Medicine

## 2015-02-18 DIAGNOSIS — R319 Hematuria, unspecified: Secondary | ICD-10-CM | POA: Diagnosis not present

## 2015-02-18 DIAGNOSIS — Z7951 Long term (current) use of inhaled steroids: Secondary | ICD-10-CM | POA: Insufficient documentation

## 2015-02-18 DIAGNOSIS — R339 Retention of urine, unspecified: Secondary | ICD-10-CM | POA: Diagnosis present

## 2015-02-18 DIAGNOSIS — Z7901 Long term (current) use of anticoagulants: Secondary | ICD-10-CM | POA: Insufficient documentation

## 2015-02-18 DIAGNOSIS — E119 Type 2 diabetes mellitus without complications: Secondary | ICD-10-CM | POA: Diagnosis not present

## 2015-02-18 DIAGNOSIS — R300 Dysuria: Secondary | ICD-10-CM | POA: Insufficient documentation

## 2015-02-18 DIAGNOSIS — Z79899 Other long term (current) drug therapy: Secondary | ICD-10-CM | POA: Diagnosis not present

## 2015-02-18 DIAGNOSIS — Z880 Allergy status to penicillin: Secondary | ICD-10-CM | POA: Insufficient documentation

## 2015-02-18 DIAGNOSIS — I10 Essential (primary) hypertension: Secondary | ICD-10-CM | POA: Diagnosis not present

## 2015-02-18 DIAGNOSIS — R1030 Lower abdominal pain, unspecified: Secondary | ICD-10-CM | POA: Diagnosis not present

## 2015-02-18 LAB — URINALYSIS COMPLETE WITH MICROSCOPIC (ARMC ONLY)
Bilirubin Urine: NEGATIVE
GLUCOSE, UA: NEGATIVE mg/dL
Ketones, ur: NEGATIVE mg/dL
Leukocytes, UA: NEGATIVE
NITRITE: NEGATIVE
Protein, ur: 100 mg/dL — AB
SQUAMOUS EPITHELIAL / LPF: NONE SEEN
Specific Gravity, Urine: 1.015 (ref 1.005–1.030)
pH: 6 (ref 5.0–8.0)

## 2015-02-18 NOTE — ED Notes (Signed)
Pt supposed to pick up abx today for UTI

## 2015-02-18 NOTE — ED Notes (Signed)
MD at bedside. 

## 2015-02-18 NOTE — ED Notes (Signed)
Pt with urinary retention, had stent placement (urinary) in October, this AM awoke and unable to urinate. Bladder Scan results of 92 ml in triage , no distress, no pain

## 2015-02-18 NOTE — ED Provider Notes (Signed)
[facility] Emergency Department Provider Note  ____________________________________________  Time seen: 10:10 AM  I have reviewed the triage vital signs and the nursing notes.   HISTORY  Chief Complaint Urinary Retention    HPI William Lozano. is a 81 y.o. male who complains of waking up feeling good this morning for the first time in 3 months. History of large kidney stones requiring ureteral stenting by urology. He's also had chronic dysuria and urinary frequency having to urinate every 15 or 20 minutes with gross hematuria for the past 3 months. He was prescribed an antibiotic by Dr. Sampson Goon that he is planning on taking up this morning and then following up with his urologist in about 5 days.  In the morning he wakes up with lower abdominal pain and dysuria and urinary frequency. This morning he woke up and did not have any pain. Additionally when he went to the bathroom he was unable to void for about 2 hours. He presents because he is concerned that he may be having renal failure. He denies any specific complaints other than his worry.     Past Medical History  Diagnosis Date  . Essential hypertension   . Atrial fibrillation (HCC) 2016    paroxysmal atrial fibrillation, sick sinus syndrome  . Kidney stones   . Hyperlipemia   . Diabetes mellitus without complication (HCC)     type 2  . Dementia     alzheimers  . Arthritis      Patient Active Problem List   Diagnosis Date Noted  . Cardiac conduction disorder 02/04/2015  . Arthritis 02/04/2015  . Atrial fibrillation (HCC) 02/04/2015  . Acid reflux 02/04/2015  . H/O vertigo 02/04/2015  . HLD (hyperlipidemia) 02/04/2015  . BP (high blood pressure) 02/04/2015  . Calculus of kidney 02/04/2015  . Sick sinus syndrome (HCC) 02/04/2015  . Fungal infection of nail 02/04/2015  . Type 2 diabetes mellitus (HCC) 02/04/2015  . Sepsis (HCC) 12/10/2014  . Altered bowel function 10/13/2014  .  Chronic diarrhea 10/13/2014  . Dementia in Alzheimer's disease with late onset 09/20/2014  . Drug resistance 10/22/2013     Past Surgical History  Procedure Laterality Date  . Hernia repair Bilateral     inguinal  . Cystoscopy with stent placement Bilateral 12/10/2014    Procedure: CYSTOSCOPY WITH STENT PLACEMENT;  Surgeon: Hildred Laser, MD;  Location: [facility] ORS;  Service: Urology;  Laterality: Bilateral;  . Cystoscopy with retrograde pyelogram, ureteroscopy and stent placement Bilateral 12/10/2014    Procedure: CYSTOSCOPY WITH RETROGRADE PYELOGRAM, URETEROSCOPY AND STENT PLACEMENT;  Surgeon: Hildred Laser, MD;  Location: [facility] ORS;  Service: Urology;  Laterality: Bilateral;  . Extracorporeal shock wave lithotripsy Right 01/07/2015    Procedure: EXTRACORPOREAL SHOCK WAVE LITHOTRIPSY (ESWL);  Surgeon: Lorraine Lax, MD;  Location: [facility] ORS;  Service: Urology;  Laterality: Right;  . Extracorporeal shock wave lithotripsy Left 01/21/2015    Procedure: EXTRACORPOREAL SHOCK WAVE LITHOTRIPSY (ESWL);  Surgeon: Hildred Laser, MD;  Location: [facility] ORS;  Service: Urology;  Laterality: Left;  Marland Kitchen Eye surgery Bilateral     Cataract Extraction with IOL     Current Outpatient Rx  Name  Route  Sig  Dispense  Refill  . apixaban (ELIQUIS) 5 MG TABS tablet   Oral   Take 5 mg by mouth 2 (two) times daily.         . cefUROXime (CEFTIN) 250 MG tablet   Oral   Take 1 tablet (250  mg total) by mouth 2 (two) times daily with a meal. Patient not taking: Reported on 12/25/2014   14 tablet   0   . CIALIS 5 MG tablet      TAKE AS NEEDED      5     Dispense as written.   . fluticasone (FLONASE) 50 MCG/ACT nasal spray   Each Nare   Place 2 sprays into both nostrils as needed. Reported on 02/04/2015         . hydrochlorothiazide (HYDRODIURIL) 25 MG tablet   Oral   Take 1 tablet (25 mg total) by mouth daily as needed. Patient taking differently: Take 25 mg by mouth daily.    30  tablet   0   . HYDROcodone-acetaminophen (NORCO/VICODIN) 5-325 MG tablet   Oral   Take 1 tablet by mouth every 4 (four) hours as needed for moderate pain. Reported on 02/04/2015         . loratadine (SM LORATADINE) 5 MG/5ML syrup   Oral   Take 5 mg by mouth daily as needed. Reported on 02/04/2015         . metoprolol succinate (TOPROL-XL) 25 MG 24 hr tablet   Oral   Take 25 mg by mouth daily.         . ondansetron (ZOFRAN) 4 MG tablet   Oral   Take 1 tablet (4 mg total) by mouth every 6 (six) hours as needed for nausea. Patient not taking: Reported on 02/04/2015   20 tablet   0   . pantoprazole (PROTONIX) 40 MG tablet   Oral   Take 40 mg by mouth daily.         . promethazine (PHENERGAN) 12.5 MG tablet   Oral   Take 12.5 mg by mouth every 6 (six) hours as needed for nausea or vomiting. Reported on 02/04/2015         . simvastatin (ZOCOR) 20 MG tablet   Oral   Take 20 mg by mouth daily.            Allergies Codeine; Diazepam; Ibuprofen; Penicillin g; and Sulfa antibiotics   Family History  Problem Relation Age of Onset  . Hypertension    . Kidney disease Neg Hx   . Prostate cancer Neg Hx     Social History Social History  Substance Use Topics  . Smoking status: Never Smoker   . Smokeless tobacco: Never Used  . Alcohol Use: No    Review of Systems  Constitutional:   No fever or chills. No weight changes Eyes:   No blurry vision or double vision.  ENT:   No sore throat. Cardiovascular:   No chest pain. Respiratory:   No dyspnea or cough. Gastrointestinal:   Negative for abdominal pain, vomiting and diarrhea.  No BRBPR or melena. Genitourinary:  Chronic dysuria, urinary frequency, and hematuria. Musculoskeletal:   Negative for back pain. No joint swelling or pain. Skin:   Negative for rash. Neurological:   Negative for headaches, focal weakness or numbness. Psychiatric:  No anxiety or depression.   Endocrine:  No hot/cold intolerance,  changes in energy, or sleep difficulty.  10-point ROS otherwise negative.  ____________________________________________   PHYSICAL EXAM:  VITAL SIGNS: ED Triage Vitals  Enc Vitals Group     BP 02/18/15 0944 131/76 mmHg     Pulse Rate 02/18/15 0944 60     Resp 02/18/15 0944 18     Temp 02/18/15 0944 97.6 F (36.4 C)  Temp Source 02/18/15 0944 Oral     SpO2 02/18/15 0944 98 %     Weight 02/18/15 0944 151 lb (68.493 kg)     Height 02/18/15 0944  (1.676 m)     Head Cir --      Peak Flow --      Pain Score 02/18/15 0945 3     Pain Loc --      Pain Edu? --      Excl. in GC? --     Vital signs reviewed, nursing assessments reviewed.   Constitutional:   Alert and oriented. Well appearing and in no distress. Eyes:   No scleral icterus. No conjunctival pallor. PERRL. EOMI ENT   Head:   Normocephalic and atraumatic.   Nose:   No congestion/rhinnorhea. No septal hematoma   Mouth/Throat:   MMM, no pharyngeal erythema. No peritonsillar mass. No uvula shift.   Neck:   No stridor. No SubQ emphysema. No meningismus. Hematological/Lymphatic/Immunilogical:   No cervical lymphadenopathy. Cardiovascular:   RRR. Normal and symmetric distal pulses are present in all extremities. No murmurs, rubs, or gallops. Respiratory:   Normal respiratory effort without tachypnea nor retractions. Breath sounds are clear and equal bilaterally. No wheezes/rales/rhonchi. Gastrointestinal:   Soft and nontender. No distention. There is no CVA tenderness.  No rebound, rigidity, or guarding. Genitourinary:   deferred Musculoskeletal:   Nontender with normal range of motion in all extremities. No joint effusions.  No lower extremity tenderness.  No edema. Neurologic:   Normal speech and language.  CN 2-10 normal. Motor grossly intact. No pronator drift.  Normal gait. No gross focal neurologic deficits are appreciated.  Skin:    Skin is warm, dry and intact. No rash noted.  No petechiae,  purpura, or bullae. Psychiatric:   Mood and affect are normal. Speech and behavior are normal. Patient exhibits appropriate insight and judgment.  ____________________________________________    LABS (pertinent positives/negatives) (all labs ordered are listed, but only abnormal results are displayed) Labs Reviewed  URINE CULTURE  URINALYSIS COMPLETEWITH MICROSCOPIC ([facility] ONLY)   ____________________________________________   EKG    ____________________________________________    RADIOLOGY    ____________________________________________   PROCEDURES   ____________________________________________   INITIAL IMPRESSION / ASSESSMENT AND PLAN / ED COURSE  Pertinent labs & imaging results that were available during my care of the patient were reviewed by me and considered in my medical decision making (see chart for details).  Patient well appearing no acute distress. It appears that he is worried about actually the absence of his chronic dysuria and urinary frequency. No reason to suspect pyelonephritis or acute renal failure or ureteral obstruction. Encouraged the patient to continue staying well-hydrated and take his antibodies as directed by Dr. Sampson Goon. He was able to void in the emergency department which we will send for urinalysis and culture to assist with the outpatient follow-up. No other labs indicated at this point, no imaging needed. We'll discharge home he has good follow-up with primary care and urology.     ____________________________________________   FINAL CLINICAL IMPRESSION(S) / ED DIAGNOSES  Final diagnoses:  Hematuria  Dysuria      Sharman Cheek, MD 02/18/15 1021

## 2015-02-18 NOTE — Discharge Instructions (Signed)
Dysuria Dysuria is pain or discomfort while urinating. The pain or discomfort may be felt in the tube that carries urine out of the bladder (urethra) or in the surrounding tissue of the genitals. The pain may also be felt in the groin area, lower abdomen, and lower back. You may have to urinate frequently or have the sudden feeling that you have to urinate (urgency). Dysuria can affect both men and women, but is more common in women. Dysuria can be caused by many different things, including:  Urinary tract infection in women.  Infection of the kidney or bladder.  Kidney stones or bladder stones.  Certain sexually transmitted infections (STIs), such as chlamydia.  Dehydration.  Inflammation of the vagina.  Use of certain medicines.  Use of certain soaps or scented products that cause irritation. HOME CARE INSTRUCTIONS Watch your dysuria for any changes. The following actions may help to reduce any discomfort you are feeling:  Drink enough fluid to keep your urine clear or pale yellow.  Empty your bladder often. Avoid holding urine for long periods of time.  After a bowel movement or urination, women should cleanse from front to back, using each tissue only once.  Empty your bladder after sexual intercourse.  Take medicines only as directed by your health care provider.  If you were prescribed an antibiotic medicine, finish it all even if you start to feel better.  Avoid caffeine, tea, and alcohol. They can irritate the bladder and make dysuria worse. In men, alcohol may irritate the prostate.  Keep all follow-up visits as directed by your health care provider. This is important.  If you had any tests done to find the cause of dysuria, it is your responsibility to obtain your test results. Ask the lab or department performing the test when and how you will get your results. Talk with your health care provider if you have any questions about your results. SEEK MEDICAL CARE  IF:  You develop pain in your back or sides.  You have a fever.  You have nausea or vomiting.  You have blood in your urine.  You are not urinating as often as you usually do. SEEK IMMEDIATE MEDICAL CARE IF:  You pain is severe and not relieved with medicines.  You are unable to hold down any fluids.  You or someone else notices a change in your mental function.  You have a rapid heartbeat at rest.  You have shaking or chills.  You feel extremely weak.   This information is not intended to replace advice given to you by your health care provider. Make sure you discuss any questions you have with your health care provider.   Document Released: 11/05/2003 Document Revised: 02/27/2014 Document Reviewed: 10/02/2013 Elsevier Interactive Patient Education 2016 Elsevier Inc.  Hematuria, Adult Hematuria is blood in your urine. It can be caused by a bladder infection, kidney infection, prostate infection, kidney stone, or cancer of your urinary tract. Infections can usually be treated with medicine, and a kidney stone usually will pass through your urine. If neither of these is the cause of your hematuria, further workup to find out the reason may be needed. It is very important that you tell your health care provider about any blood you see in your urine, even if the blood stops without treatment or happens without causing pain. Blood in your urine that happens and then stops and then happens again can be a symptom of a very serious condition. Also, pain is not a  symptom in the initial stages of many urinary cancers. HOME CARE INSTRUCTIONS   Drink lots of fluid, 3-4 quarts a day. If you have been diagnosed with an infection, cranberry juice is especially recommended, in addition to large amounts of water.  Avoid caffeine, tea, and carbonated beverages because they tend to irritate the bladder.  Avoid alcohol because it may irritate the prostate.  Take all medicines as directed by  your health care provider.  If you were prescribed an antibiotic medicine, finish it all even if you start to feel better.  If you have been diagnosed with a kidney stone, follow your health care provider's instructions regarding straining your urine to catch the stone.  Empty your bladder often. Avoid holding urine for long periods of time.  After a bowel movement, women should cleanse front to back. Use each tissue only once.  Empty your bladder before and after sexual intercourse if you are a male. SEEK MEDICAL CARE IF:  You develop back pain.  You have a fever.  You have a feeling of sickness in your stomach (nausea) or vomiting.  Your symptoms are not better in 3 days. Return sooner if you are getting worse. SEEK IMMEDIATE MEDICAL CARE IF:   You develop severe vomiting and are unable to keep the medicine down.  You develop severe back or abdominal pain despite taking your medicines.  You begin passing a large amount of blood or clots in your urine.  You feel extremely weak or faint, or you pass out. MAKE SURE YOU:   Understand these instructions.  Will watch your condition.  Will get help right away if you are not doing well or get worse.   This information is not intended to replace advice given to you by your health care provider. Make sure you discuss any questions you have with your health care provider.   Document Released: 02/06/2005 Document Revised: 02/27/2014 Document Reviewed: 10/07/2012 Elsevier Interactive Patient Education [employer] Elsevier Inc.

## 2015-02-19 LAB — URINE CULTURE

## 2015-02-23 ENCOUNTER — Other Ambulatory Visit: Payer: Medicare Other

## 2015-02-24 ENCOUNTER — Ambulatory Visit: Payer: Medicare Other | Admitting: Anesthesiology

## 2015-02-24 ENCOUNTER — Observation Stay
Admission: RE | Admit: 2015-02-24 | Discharge: 2015-02-25 | Disposition: A | Discharge status: Home / Self Care | Payer: Medicare Other | Source: Ambulatory Visit | Attending: Specialist | Admitting: Specialist

## 2015-02-24 ENCOUNTER — Telehealth: Payer: Self-pay

## 2015-02-24 ENCOUNTER — Encounter
Admission: RE | Disposition: A | Discharge status: Home / Self Care | Payer: Self-pay | Source: Ambulatory Visit | Attending: Internal Medicine

## 2015-02-24 DIAGNOSIS — R55 Syncope and collapse: Secondary | ICD-10-CM | POA: Insufficient documentation

## 2015-02-24 DIAGNOSIS — I10 Essential (primary) hypertension: Secondary | ICD-10-CM | POA: Diagnosis not present

## 2015-02-24 DIAGNOSIS — Z8249 Family history of ischemic heart disease and other diseases of the circulatory system: Secondary | ICD-10-CM | POA: Diagnosis not present

## 2015-02-24 DIAGNOSIS — G309 Alzheimer's disease, unspecified: Secondary | ICD-10-CM | POA: Insufficient documentation

## 2015-02-24 DIAGNOSIS — I480 Paroxysmal atrial fibrillation: Secondary | ICD-10-CM | POA: Insufficient documentation

## 2015-02-24 DIAGNOSIS — E119 Type 2 diabetes mellitus without complications: Secondary | ICD-10-CM | POA: Diagnosis not present

## 2015-02-24 DIAGNOSIS — Z880 Allergy status to penicillin: Secondary | ICD-10-CM | POA: Insufficient documentation

## 2015-02-24 DIAGNOSIS — Z882 Allergy status to sulfonamides status: Secondary | ICD-10-CM | POA: Insufficient documentation

## 2015-02-24 DIAGNOSIS — I482 Chronic atrial fibrillation: Secondary | ICD-10-CM | POA: Diagnosis not present

## 2015-02-24 DIAGNOSIS — Z885 Allergy status to narcotic agent status: Secondary | ICD-10-CM | POA: Insufficient documentation

## 2015-02-24 DIAGNOSIS — R339 Retention of urine, unspecified: Secondary | ICD-10-CM | POA: Insufficient documentation

## 2015-02-24 DIAGNOSIS — R300 Dysuria: Secondary | ICD-10-CM | POA: Insufficient documentation

## 2015-02-24 DIAGNOSIS — Z87442 Personal history of urinary calculi: Secondary | ICD-10-CM | POA: Diagnosis not present

## 2015-02-24 DIAGNOSIS — N179 Acute kidney failure, unspecified: Secondary | ICD-10-CM | POA: Insufficient documentation

## 2015-02-24 DIAGNOSIS — Z888 Allergy status to other drugs, medicaments and biological substances status: Secondary | ICD-10-CM | POA: Insufficient documentation

## 2015-02-24 DIAGNOSIS — I4891 Unspecified atrial fibrillation: Secondary | ICD-10-CM | POA: Diagnosis present

## 2015-02-24 DIAGNOSIS — K219 Gastro-esophageal reflux disease without esophagitis: Secondary | ICD-10-CM | POA: Diagnosis not present

## 2015-02-24 DIAGNOSIS — Z79899 Other long term (current) drug therapy: Secondary | ICD-10-CM | POA: Diagnosis not present

## 2015-02-24 DIAGNOSIS — F0280 Dementia in other diseases classified elsewhere without behavioral disturbance: Secondary | ICD-10-CM | POA: Diagnosis not present

## 2015-02-24 DIAGNOSIS — R42 Dizziness and giddiness: Secondary | ICD-10-CM | POA: Insufficient documentation

## 2015-02-24 DIAGNOSIS — M1990 Unspecified osteoarthritis, unspecified site: Secondary | ICD-10-CM | POA: Diagnosis not present

## 2015-02-24 DIAGNOSIS — N201 Calculus of ureter: Principal | ICD-10-CM | POA: Insufficient documentation

## 2015-02-24 DIAGNOSIS — I495 Sick sinus syndrome: Secondary | ICD-10-CM | POA: Insufficient documentation

## 2015-02-24 DIAGNOSIS — E785 Hyperlipidemia, unspecified: Secondary | ICD-10-CM | POA: Insufficient documentation

## 2015-02-24 HISTORY — PX: CYSTOSCOPY W/ URETERAL STENT PLACEMENT: SHX1429

## 2015-02-24 HISTORY — PX: URETEROSCOPY WITH HOLMIUM LASER LITHOTRIPSY: SHX6645

## 2015-02-24 LAB — COMPREHENSIVE METABOLIC PANEL
ALT: 13 U/L — ABNORMAL LOW (ref 17–63)
AST: 30 U/L (ref 15–41)
Albumin: 4.1 g/dL (ref 3.5–5.0)
Alkaline Phosphatase: 62 U/L (ref 38–126)
Anion gap: 11 (ref 5–15)
BILIRUBIN TOTAL: 1.4 mg/dL — AB (ref 0.3–1.2)
BUN: 24 mg/dL — AB (ref 6–20)
CO2: 19 mmol/L — ABNORMAL LOW (ref 22–32)
CREATININE: 1.62 mg/dL — AB (ref 0.61–1.24)
Calcium: 9.2 mg/dL (ref 8.9–10.3)
Chloride: 113 mmol/L — ABNORMAL HIGH (ref 101–111)
GFR calc Af Amer: 45 mL/min — ABNORMAL LOW (ref >60)
GFR, EST NON AFRICAN AMERICAN: 38 mL/min — AB (ref >60)
Glucose, Bld: 199 mg/dL — ABNORMAL HIGH (ref 65–99)
POTASSIUM: 4.6 mmol/L (ref 3.5–5.1)
Sodium: 143 mmol/L (ref 135–145)
TOTAL PROTEIN: 6.9 g/dL (ref 6.5–8.1)

## 2015-02-24 LAB — CBC
HEMATOCRIT: 45.2 % (ref 40.0–52.0)
HEMOGLOBIN: 14.8 g/dL (ref 13.0–18.0)
MCH: 29.7 pg (ref 26.0–34.0)
MCHC: 32.7 g/dL (ref 32.0–36.0)
MCV: 91 fL (ref 80.0–100.0)
Platelets: 260 10*3/uL (ref 150–440)
RBC: 4.97 MIL/uL (ref 4.40–5.90)
RDW: 15.2 % — ABNORMAL HIGH (ref 11.5–14.5)
WBC: 17.1 10*3/uL — ABNORMAL HIGH (ref 3.8–10.6)

## 2015-02-24 LAB — PROTIME-INR
INR: 1.15
Prothrombin Time: 14.9 seconds (ref 11.4–15.0)

## 2015-02-24 LAB — MRSA PCR SCREENING: MRSA BY PCR: NEGATIVE

## 2015-02-24 LAB — MAGNESIUM
MAGNESIUM: 1.8 mg/dL (ref 1.7–2.4)
Magnesium: 0.1 mg/dL — CL (ref 1.7–2.4)

## 2015-02-24 LAB — TROPONIN I

## 2015-02-24 LAB — GLUCOSE, CAPILLARY: Glucose-Capillary: 106 mg/dL — ABNORMAL HIGH (ref 65–99)

## 2015-02-24 SURGERY — URETEROSCOPY, WITH LITHOTRIPSY USING HOLMIUM LASER
Anesthesia: General | Laterality: Bilateral | Wound class: Clean Contaminated

## 2015-02-24 MED ORDER — ACETAMINOPHEN 650 MG RE SUPP: 650.0000 mg | Freq: Four times a day (QID) | RECTAL | Status: DC | PRN

## 2015-02-24 MED ORDER — HYDROCODONE-ACETAMINOPHEN 5-325 MG PO TABS: 1.0000 | ORAL_TABLET | ORAL | Status: DC | PRN

## 2015-02-24 MED ORDER — SODIUM CHLORIDE 0.9 % IJ SOLN
3.0000 mL | Freq: Two times a day (BID) | INTRAMUSCULAR | Status: DC
  Administered 2015-02-24: 3 mL via INTRAVENOUS

## 2015-02-24 MED ORDER — PROMETHAZINE HCL 12.5 MG PO TABS
12.5000 mg | ORAL_TABLET | Freq: Four times a day (QID) | ORAL | Status: DC | PRN
  Filled 2015-02-24: qty 1

## 2015-02-24 MED ORDER — PANTOPRAZOLE SODIUM 40 MG PO TBEC
40.0000 mg | DELAYED_RELEASE_TABLET | Freq: Every day | ORAL | Status: DC
  Administered 2015-02-25: 40 mg via ORAL
  Filled 2015-02-24: qty 1

## 2015-02-24 MED ORDER — DIAZEPAM 2 MG PO TABS: 10.0000 mg | ORAL_TABLET | Freq: Once | ORAL | Status: DC

## 2015-02-24 MED ORDER — METOPROLOL SUCCINATE ER 25 MG PO TB24
25.0000 mg | ORAL_TABLET | Freq: Every day | ORAL | Status: DC
  Administered 2015-02-25: 25 mg via ORAL
  Filled 2015-02-24: qty 1

## 2015-02-24 MED ORDER — CIPROFLOXACIN HCL 500 MG PO TABS: 500.0000 mg | ORAL_TABLET | Freq: Two times a day (BID) | ORAL | Status: DC

## 2015-02-24 MED ORDER — SUCCINYLCHOLINE CHLORIDE 20 MG/ML IJ SOLN
INTRAMUSCULAR | Status: DC | PRN
  Administered 2015-02-24: 80 mg via INTRAVENOUS

## 2015-02-24 MED ORDER — IOTHALAMATE MEGLUMINE 17.2 % UR SOLN
URETHRAL | Status: DC | PRN
  Administered 2015-02-24: 40 mL via URETHRAL

## 2015-02-24 MED ORDER — DEXAMETHASONE SODIUM PHOSPHATE 10 MG/ML IJ SOLN
INTRAMUSCULAR | Status: DC | PRN
  Administered 2015-02-24: 10 mg via INTRAVENOUS

## 2015-02-24 MED ORDER — SODIUM CHLORIDE 0.9 % IV SOLN
INTRAVENOUS | Status: DC
  Administered 2015-02-24: 09:00:00 via INTRAVENOUS

## 2015-02-24 MED ORDER — CIPROFLOXACIN IN D5W 400 MG/200ML IV SOLN
400.0000 mg | Freq: Two times a day (BID) | INTRAVENOUS | Status: DC
  Administered 2015-02-24 – 2015-02-25 (×2): 400 mg via INTRAVENOUS
  Filled 2015-02-24 (×3): qty 200

## 2015-02-24 MED ORDER — METOPROLOL TARTRATE 1 MG/ML IV SOLN
INTRAVENOUS | Status: DC | PRN
  Administered 2015-02-24 (×2): 1 mg via INTRAVENOUS

## 2015-02-24 MED ORDER — KETOROLAC TROMETHAMINE 30 MG/ML IJ SOLN
INTRAMUSCULAR | Status: DC | PRN
  Administered 2015-02-24: 30 mg via INTRAVENOUS

## 2015-02-24 MED ORDER — SUGAMMADEX SODIUM 200 MG/2ML IV SOLN
INTRAVENOUS | Status: DC | PRN
  Administered 2015-02-24: 137 mg via INTRAVENOUS

## 2015-02-24 MED ORDER — SODIUM CHLORIDE 0.9 % IV SOLN
500.0000 mg | Freq: Once | INTRAVENOUS | Status: AC
  Administered 2015-02-24: 500 mg via INTRAVENOUS
  Filled 2015-02-24: qty 500

## 2015-02-24 MED ORDER — DOCUSATE SODIUM 100 MG PO CAPS
100.0000 mg | ORAL_CAPSULE | Freq: Two times a day (BID) | ORAL | Status: DC
  Administered 2015-02-24 – 2015-02-25 (×3): 100 mg via ORAL
  Filled 2015-02-24 (×3): qty 1

## 2015-02-24 MED ORDER — NALOXONE HCL 2 MG/2ML IJ SOSY
0.4000 mg | PREFILLED_SYRINGE | INTRAMUSCULAR | Status: DC | PRN
  Administered 2015-02-24: 2 mg via INTRAVENOUS
  Filled 2015-02-24: qty 2

## 2015-02-24 MED ORDER — PHENYLEPHRINE HCL 10 MG/ML IJ SOLN
INTRAMUSCULAR | Status: DC | PRN
  Administered 2015-02-24: 100 ug via INTRAVENOUS

## 2015-02-24 MED ORDER — ONDANSETRON HCL 4 MG PO TABS: 4.0000 mg | ORAL_TABLET | Freq: Four times a day (QID) | ORAL | Status: DC | PRN

## 2015-02-24 MED ORDER — SIMVASTATIN 20 MG PO TABS
20.0000 mg | ORAL_TABLET | Freq: Every day | ORAL | Status: DC
  Administered 2015-02-24 – 2015-02-25 (×2): 20 mg via ORAL
  Filled 2015-02-24 (×2): qty 1

## 2015-02-24 MED ORDER — ACETAMINOPHEN 325 MG PO TABS: 650.0000 mg | ORAL_TABLET | Freq: Four times a day (QID) | ORAL | Status: DC | PRN

## 2015-02-24 MED ORDER — SODIUM CHLORIDE 0.9 % IV SOLN
0.5000 g | INTRAVENOUS | Status: DC | PRN
  Administered 2015-02-24: 0.5 g via INTRAVENOUS

## 2015-02-24 MED ORDER — CIPROFLOXACIN IN D5W 400 MG/200ML IV SOLN
400.0000 mg | Freq: Two times a day (BID) | INTRAVENOUS | Status: DC
  Filled 2015-02-24: qty 200

## 2015-02-24 MED ORDER — ONDANSETRON HCL 4 MG/2ML IJ SOLN: 4.0000 mg | Freq: Once | INTRAMUSCULAR | Status: DC | PRN

## 2015-02-24 MED ORDER — SODIUM CHLORIDE 0.9 % IV SOLN
INTRAVENOUS | Status: DC
  Administered 2015-02-25: 07:00:00 via INTRAVENOUS

## 2015-02-24 MED ORDER — NALOXONE HCL 2 MG/2ML IJ SOSY
PREFILLED_SYRINGE | INTRAMUSCULAR | Status: AC
  Administered 2015-02-24: 2 mg via INTRAVENOUS
  Filled 2015-02-24: qty 2

## 2015-02-24 MED ORDER — CIPROFLOXACIN IN D5W 400 MG/200ML IV SOLN
400.0000 mg | Freq: Once | INTRAVENOUS | Status: AC
  Administered 2015-02-24: 400 mg via INTRAVENOUS

## 2015-02-24 MED ORDER — FENTANYL CITRATE (PF) 100 MCG/2ML IJ SOLN
INTRAMUSCULAR | Status: AC
  Filled 2015-02-24: qty 2

## 2015-02-24 MED ORDER — ONDANSETRON HCL 4 MG/2ML IJ SOLN
4.0000 mg | Freq: Four times a day (QID) | INTRAMUSCULAR | Status: DC | PRN
  Administered 2015-02-24: 4 mg via INTRAVENOUS
  Filled 2015-02-24: qty 2

## 2015-02-24 MED ORDER — SODIUM CHLORIDE 0.9 % IV BOLUS (SEPSIS)
250.0000 mL | Freq: Once | INTRAVENOUS | Status: AC
  Administered 2015-02-24: 250 mL via INTRAVENOUS

## 2015-02-24 MED ORDER — CIPROFLOXACIN IN D5W 400 MG/200ML IV SOLN
INTRAVENOUS | Status: AC
  Administered 2015-02-24: 400 mg via INTRAVENOUS
  Filled 2015-02-24: qty 200

## 2015-02-24 MED ORDER — PROPOFOL 10 MG/ML IV BOLUS
INTRAVENOUS | Status: DC | PRN
  Administered 2015-02-24: 150 mg via INTRAVENOUS

## 2015-02-24 MED ORDER — LABETALOL HCL 5 MG/ML IV SOLN
INTRAVENOUS | Status: DC | PRN
  Administered 2015-02-24: 2.5 mg via INTRAVENOUS

## 2015-02-24 MED ORDER — NALOXONE HCL 2 MG/2ML IJ SOSY: 0.4000 mg | PREFILLED_SYRINGE | INTRAMUSCULAR | Status: DC | PRN

## 2015-02-24 MED ORDER — ONDANSETRON HCL 4 MG/2ML IJ SOLN
INTRAMUSCULAR | Status: DC | PRN
  Administered 2015-02-24: 4 mg via INTRAVENOUS

## 2015-02-24 MED ORDER — FENTANYL CITRATE (PF) 100 MCG/2ML IJ SOLN
INTRAMUSCULAR | Status: DC | PRN
  Administered 2015-02-24: 100 ug via INTRAVENOUS
  Administered 2015-02-24: 50 ug via INTRAVENOUS

## 2015-02-24 MED ORDER — HYDROCODONE-ACETAMINOPHEN 5-325 MG PO TABS
1.0000 | ORAL_TABLET | ORAL | Status: DC | PRN
  Administered 2015-02-24 – 2015-02-25 (×2): 1 via ORAL
  Filled 2015-02-24 (×2): qty 1

## 2015-02-24 MED ORDER — ROCURONIUM BROMIDE 100 MG/10ML IV SOLN
INTRAVENOUS | Status: DC | PRN
  Administered 2015-02-24: 20 mg via INTRAVENOUS
  Administered 2015-02-24: 10 mg via INTRAVENOUS

## 2015-02-24 MED ORDER — FLUTICASONE PROPIONATE 50 MCG/ACT NA SUSP
2.0000 | Freq: Every day | NASAL | Status: DC
  Filled 2015-02-24 (×2): qty 16

## 2015-02-24 MED ORDER — FENTANYL CITRATE (PF) 100 MCG/2ML IJ SOLN
25.0000 ug | INTRAMUSCULAR | Status: DC | PRN
  Administered 2015-02-24 (×2): 25 ug via INTRAVENOUS

## 2015-02-24 SURGICAL SUPPLY — 29 items
BACTOSHIELD CHG 4% 4OZ (MISCELLANEOUS) ×2
BASKET ZERO TIP 1.9FR (BASKET) ×6 IMPLANT
CATH URETL 5X70 OPEN END (CATHETERS) ×3 IMPLANT
CNTNR SPEC 2.5X3XGRAD LEK (MISCELLANEOUS) ×1
CONT SPEC 4OZ STER OR WHT (MISCELLANEOUS) ×2
CONTAINER SPEC 2.5X3XGRAD LEK (MISCELLANEOUS) ×1 IMPLANT
FEE TECHNICIAN ONLY PER HOUR (MISCELLANEOUS) IMPLANT
GLOVE BIO SURGEON STRL SZ7 (GLOVE) ×6 IMPLANT
GLOVE BIO SURGEON STRL SZ7.5 (GLOVE) ×3 IMPLANT
GOWN STRL REUS W/ TWL LRG LVL4 (GOWN DISPOSABLE) ×1 IMPLANT
GOWN STRL REUS W/TWL LRG LVL4 (GOWN DISPOSABLE) ×2
GOWN STRL REUS W/TWL XL LVL3 (GOWN DISPOSABLE) ×3 IMPLANT
GUIDEWIRE SUPER STIFF (WIRE) IMPLANT
KIT RM TURNOVER CYSTO AR (KITS) ×3 IMPLANT
LASER FIBER 200M SMARTSCOPE (Laser) ×3 IMPLANT
LASER HOLMIUM FIBER SU 272UM (MISCELLANEOUS) IMPLANT
PACK CYSTO AR (MISCELLANEOUS) ×3 IMPLANT
SCRUB CHG 4% DYNA-HEX 4OZ (MISCELLANEOUS) ×1 IMPLANT
SENSORWIRE 0.038 NOT ANGLED (WIRE) ×6
SET CYSTO W/LG BORE CLAMP LF (SET/KITS/TRAYS/PACK) ×3 IMPLANT
SHEATH URETERAL 13/15X36 1L (SHEATH) IMPLANT
SOL .9 NS 3000ML IRR  AL (IV SOLUTION) ×2
SOL .9 NS 3000ML IRR UROMATIC (IV SOLUTION) ×1 IMPLANT
STENT URET 6FRX24 CONTOUR (STENTS) IMPLANT
STENT URET 6FRX26 CONTOUR (STENTS) ×6 IMPLANT
SURGILUBE 2OZ TUBE FLIPTOP (MISCELLANEOUS) ×3 IMPLANT
SYRINGE IRR TOOMEY STRL 70CC (SYRINGE) ×3 IMPLANT
WATER STERILE IRR 1000ML POUR (IV SOLUTION) ×3 IMPLANT
WIRE SENSOR 0.038 NOT ANGLED (WIRE) ×2 IMPLANT

## 2015-02-24 NOTE — Transfer of Care (Signed)
Immediate Anesthesia Transfer of Care Note  Patient: William Lozano.  Procedure(s) Performed: Procedure(s): URETEROSCOPY WITH HOLMIUM LASER LITHOTRIPSY /STONE BASKETING (Bilateral) CYSTOSCOPY WITH STENT REPLACEMENT (Bilateral)  Patient Location: PACU  Anesthesia Type:General  Level of Consciousness: patient cooperative and lethargic  Airway & Oxygen Therapy: Patient Spontanous Breathing and Patient connected to face mask oxygen  Post-op Assessment: Report given to RN and Post -op Vital signs reviewed and stable  Post vital signs: Reviewed and stable  Last Vitals:  Filed Vitals:   02/24/15 0818 02/24/15 1118  BP: 135/55 116/81  Pulse: 87 95  Temp: 36.4 C 36.4 C  Resp: 18 18    Complications: No apparent anesthesia complications

## 2015-02-24 NOTE — Anesthesia Procedure Notes (Signed)
Procedure Name: Intubation Date/Time: 02/24/2015 9:18 AM Performed by: Omer JackWEATHERLY, Stephannie Broner Pre-anesthesia Checklist: Patient identified, Patient being monitored, Timeout performed, Emergency Drugs available and Suction available Patient Re-evaluated:Patient Re-evaluated prior to inductionOxygen Delivery Method: Circle system utilized Preoxygenation: Pre-oxygenation with 100% oxygen Intubation Type: IV induction Ventilation: Mask ventilation without difficulty Laryngoscope Size: Mac and 3 Grade View: Grade I Tube type: Oral Tube size: 7.5 mm Number of attempts: 1 Placement Confirmation: positive ETCO2 and breath sounds checked- equal and bilateral Secured at: 21 cm Tube secured with: Tape Dental Injury: Teeth and Oropharynx as per pre-operative assessment

## 2015-02-24 NOTE — Progress Notes (Signed)
Called and spoke with Dr. Winona LegatoVaickute in regards to patient has not voided yet inspite of 2 tries. Bladder scan showed 130cc. Orders received to give 250 cc bolus now and increase fluids to 120 cc/hr. Stated if the unit gets in a bed crunch pt can move out of the unit to Any Med-surg with telemetry. Dr. Winona LegatoVaickute stated if pt's urine output increases cut the NS back to 50 cc/hr.

## 2015-02-24 NOTE — Interval H&P Note (Signed)
History and Physical Interval Note:  02/24/2015 8:46 AM  William PigeonJames B Larsson Sr.  has presented today for surgery, with the diagnosis of bilateral ureteral stones  The various methods of treatment have been discussed with the patient and family. After consideration of risks, benefits and other options for treatment, the patient has consented to  Procedure(s): URETEROSCOPY WITH HOLMIUM LASER LITHOTRIPSY /STONE BASKETING (Bilateral) CYSTOSCOPY WITH STENT REPLACEMENT (Bilateral) as a surgical intervention .  The patient's history has been reviewed, patient examined, no change in status, stable for surgery.  I have reviewed the patient's chart and labs.  Questions were answered to the patient's satisfaction.    RRR Unlabored resp  Hildred LaserBrian Meet Jinna Weinman

## 2015-02-24 NOTE — Anesthesia Preprocedure Evaluation (Signed)
Anesthesia Evaluation  Patient identified by MRN, date of birth, ID band Patient awake    Reviewed: Allergy & Precautions, NPO status , Patient's Chart, lab work & pertinent test results, reviewed documented beta blocker date and time   Airway Mallampati: II       Dental  (+) Teeth Intact   Pulmonary neg pulmonary ROS,    Pulmonary exam normal        Cardiovascular Exercise Tolerance: Good hypertension, Pt. on home beta blockers + dysrhythmias Atrial Fibrillation  Rhythm:Irregular Rate:Tachycardia     Neuro/Psych    GI/Hepatic Neg liver ROS, GERD  ,  Endo/Other  diabetes  Renal/GU Renal disease     Musculoskeletal   Abdominal Normal abdominal exam  (+)   Peds  Hematology negative hematology ROS (+)   Anesthesia Other Findings   Reproductive/Obstetrics                             Anesthesia Physical Anesthesia Plan  ASA: III  Anesthesia Plan: General   Post-op Pain Management:    Induction: Intravenous  Airway Management Planned: Oral ETT  Additional Equipment:   Intra-op Plan:   Post-operative Plan: Extubation in OR  Informed Consent: I have reviewed the patients History and Physical, chart, labs and discussed the procedure including the risks, benefits and alternatives for the proposed anesthesia with the patient or authorized representative who has indicated his/her understanding and acceptance.     Plan Discussed with: CRNA  Anesthesia Plan Comments:         Anesthesia Quick Evaluation

## 2015-02-24 NOTE — Op Note (Signed)
Date of procedure: 02/24/2015  Preoperative diagnosis:  1. Bilateral ureteral stones  Postoperative diagnosis:  1. Bilateral ureteral stones   Procedure: 1. Cystoscopy 2. Bilateral ureteroscopy 3. Laser lithotripsy 4. Stone basketing 5. Bilateral retrograde pyelogram with interpretation 6. Bilateral ureteral stent exchange (6 [place] by 26 cm)  Surgeon: Baruch Gouty, MD  Anesthesia: General  Complications: None  Intraoperative findings: Bilateral ureteral stones. Bilateral retrograde polygrams within the procedure showed no significant filling defects and correct stent placement  EBL: None  Specimens: Bilateral ureteral stones pathology  Drains: Bilateral 6 French by 26 cm double-J ureteral stents  Disposition: Stable to the postanesthesia care unit  Indication for procedure: The patient is a 82 y.o. male with bilateral ureteral stones presents for definitive surgical management.  After reviewing the management options for treatment, the patient elected to proceed with the above surgical procedure(s). We have discussed the potential benefits and risks of the procedure, side effects of the proposed treatment, the likelihood of the patient achieving the goals of the procedure, and any potential problems that might occur during the procedure or recuperation. Informed consent has been obtained.  Description of procedure: The patient was met in the preoperative area. All risks, benefits, and indications of the procedure were described in great detail. The patient consented to the procedure. Preoperative antibiotics were given. The patient was taken to the operative theater. General anesthesia was induced per the anesthesia service. The patient was then placed in the dorsal lithotomy position and prepped and draped in the usual sterile fashion. A preoperative timeout was called.    A 21 French 30 cystoscope was inserted into the patient's bladder per urethra atraumatically. The right  ureteral stent was grasped flexible graspers and brought to level the urethral meatus. A sensor wire was exchanged through the right ureteral stent and the stent was removed. Semirigid ureteroscope was inserted in the patient's urethra and into the right ureter. Pan ureteroscopy revealed multiple small stone fragments removed with the stone basket. Pain ureteroscopy at this point was unremarkable with no further fragments. A retrograde polygrams on the right side was also unremarkable for filling defects. The semirigid ureteroscope was exchanged for cystoscope. A 6 French by 26 cm double-J ureteral stent was placed over the sensor wire a sensor wire was removed. The stent was confirmed in the correct location with a curl seen in the patient's right renal pelvis on fluoroscopy and a curl seen in the patient's urinary bladder direct position. This process was then repeated on the left side. During left ureteroscopy, it was noted the patient had impacted left ureteral stone. This was broken into small fragments with laser lithotripsy. These fragments were then removed. There is was then removed and a left retrograde pyelogram was unremarkable. A left ureter stent was placed in the same fashion as the right side. The stone was 6 [place] by 35 [place]. Patient's bladder was then drained all stone fragment removed. Patient was woken from anesthesia and transferred stable condition to postanesthesia care unit  Plan: The patient will follow-up in 2 weeks for stent removal.  Baruch Gouty, M.D.

## 2015-02-24 NOTE — H&P (View-Only) (Signed)
02/04/2015 9:28 AM   William PigeonJames B Maturin Sr. 04/22/1932 [id number]  Referring provider: Marguarite ArbourJeffrey D Sparks, MD [address] [facility] [place], [place] [id number]  Chief Complaint  Patient presents with  . Routine Post Op    f/u lithotripsy, KUB    HPI: The patient is an 82 year old male who follows up after having emergent stent placement for bilateral obstructing ureteral stones and urosepsis. He has done better since the surgery. Though he complains of frequency and dysuria. Urine and blood cultures came back negative, however during the time of surgery, he was seen to have purulent discharge from his left ureteral orifice. The patient is doing much better now. He is adamant that he does not want to have a repeat cystoscopy at this time as he did not like how it felt after his last surgery.  Interval history: The patient underwent bilateral ESWL in a stage fashion and returns today for follow-up. He has done well since his surgery and has no complaints this time. KUB today does show residual stone in both ureters bilaterally with the stents in place.   PMH: Past Medical History  Diagnosis Date  . Essential hypertension   . Atrial fibrillation (HCC) 2016    paroxysmal atrial fibrillation, sick sinus syndrome  . Kidney stones   . Hyperlipemia   . Diabetes mellitus without complication (HCC)     type 2  . Dementia     alzheimers    Surgical History: Past Surgical History  Procedure Laterality Date  . Hernia repair Bilateral     inguinal  . Cystoscopy with stent placement Bilateral 12/10/2014    Procedure: CYSTOSCOPY WITH STENT PLACEMENT;  Surgeon: Hildred LaserBrian Ashdon Shaya Reddick, MD;  Location: [facility] ORS;  Service: Urology;  Laterality: Bilateral;  . Cystoscopy with retrograde pyelogram, ureteroscopy and stent placement Bilateral 12/10/2014    Procedure: CYSTOSCOPY WITH RETROGRADE PYELOGRAM, URETEROSCOPY AND STENT PLACEMENT;  Surgeon: Hildred LaserBrian Isaiahs Charlie Seda, MD;  Location: [facility] OR[facility];   Service: Urology;  Laterality: Bilateral;  . Extracorporeal shock wave lithotripsy Right 01/07/2015    Procedure: EXTRACORPOREAL SHOCK WAVE LITHOTRIPSY (ESWL);  Surgeon: [name], MD;  Location: [facility] ORS;  Service: Urology;  Laterality: Right;  . Extracorporeal shock wave lithotripsy Left 01/21/2015    Procedure: EXTRACORPOREAL SHOCK WAVE LITHOTRIPSY (ESWL);  Surgeon: Hildred LaserBrian Kasheem Vania Rosero, MD;  Location: [facility] ORS;  Service: Urology;  Laterality: Left;    Home Medications:    Medication List       This list is accurate as of: 02/04/15  9:28 AM.  Always use your most recent med list.               apixaban 5 MG Tabs tablet  Commonly known as:  ELIQUIS  Take 5 mg by mouth 2 (two) times daily.     cefUROXime 250 MG tablet  Commonly known as:  CEFTIN  Take 1 tablet (250 mg total) by mouth 2 (two) times daily with a meal.     CIALIS 5 MG tablet  Generic drug:  tadalafil  TK 1 T PO Q DAY     ciprofloxacin 250 MG tablet  Commonly known as:  CIPRO  Reported on 02/04/2015     fluticasone 50 MCG/ACT nasal spray  Commonly known as:  FLONASE  Place 1 spray into both nostrils 2 (two) times daily. Reported on 02/04/2015     hydrochlorothiazide 25 MG tablet  Commonly known as:  HYDRODIURIL  Take 1 tablet (25 mg total) by mouth  daily as needed.     HYDROcodone-acetaminophen 5-325 MG tablet  Commonly known as:  NORCO/VICODIN  Take 1 tablet by mouth every 4 (four) hours as needed for moderate pain. Reported on 02/04/2015     metoprolol succinate 25 MG 24 hr tablet  Commonly known as:  TOPROL-XL  Take 25 mg by mouth daily.     ondansetron 4 MG tablet  Commonly known as:  ZOFRAN  Take 1 tablet (4 mg total) by mouth every 6 (six) hours as needed for nausea.     pantoprazole 40 MG tablet  Commonly known as:  PROTONIX  Take 40 mg by mouth daily.     promethazine 12.5 MG tablet  Commonly known as:  PHENERGAN  Take 12.5 mg by mouth every 6 (six) hours as needed for nausea or  vomiting. Reported on 02/04/2015     simvastatin 20 MG tablet  Commonly known as:  ZOCOR  Take 20 mg by mouth daily.     SM LORATADINE 5 MG/5ML syrup  Generic drug:  loratadine  Take by mouth. Reported on 02/04/2015        Allergies:  Allergies  Allergen Reactions  . Codeine Other (See Comments)    Causes side effects  . Diazepam Other (See Comments)  . Ibuprofen Other (See Comments)  . Penicillin G Other (See Comments)  . Sulfa Antibiotics Rash    Family History: Family History  Problem Relation Age of Onset  . Hypertension    . Kidney disease Neg Hx   . Prostate cancer Neg Hx     Social History:  reports that he has never smoked. He does not have any smokeless tobacco history on file. He reports that he does not drink alcohol or use illicit drugs.  ROS: UROLOGY Frequent Urination?: Yes Hard to postpone urination?: No Burning/pain with urination?: No Get up at night to urinate?: No Leakage of urine?: No Urine stream starts and stops?: No Trouble starting stream?: No Do you have to strain to urinate?: No Blood in urine?: No Urinary tract infection?: No Sexually transmitted disease?: No Injury to kidneys or bladder?: No Painful intercourse?: No Weak stream?: No Erection problems?: No Penile pain?: No  Gastrointestinal Nausea?: No Vomiting?: No Indigestion/heartburn?: No Diarrhea?: No Constipation?: No  Constitutional Fever: No Night sweats?: No Weight loss?: No Fatigue?: No  Skin Skin rash/lesions?: No Itching?: No  Eyes Blurred vision?: No Double vision?: No  Ears/Nose/Throat Sore throat?: No Sinus problems?: No  Hematologic/Lymphatic Swollen glands?: No Easy bruising?: No  Cardiovascular Leg swelling?: No Chest pain?: No  Respiratory Cough?: No Shortness of breath?: No  Endocrine Excessive thirst?: No  Musculoskeletal Back pain?: No Joint pain?: No  Neurological Headaches?: No Dizziness?:  No  Psychologic Depression?: No Anxiety?: No  Physical Exam: BP 136/84 mmHg  Pulse 90  Ht 5\' 6"  (1.676 m)  Wt 150 lb 11.2 oz (68.357 kg)  BMI 24.34 kg/m2  Constitutional:  Alert and oriented, No acute distress. HEENT: [place] AT, moist mucus membranes.  Trachea midline, no masses. Cardiovascular: No clubbing, cyanosis, or edema. Respiratory: Normal respiratory effort, no increased work of breathing. GI: Abdomen is soft, nontender, nondistended, no abdominal masses GU: No CVA tenderness.  Skin: No rashes, bruises or suspicious lesions. Lymph: No cervical or inguinal adenopathy. Neurologic: Grossly intact, no focal deficits, moving all 4 extremities. Psychiatric: Normal mood and affect.  Laboratory Data: Lab Results  Component Value Date   WBC 10.8* 12/14/2014   HGB 12.3* 12/14/2014   HCT 37.3*  12/14/2014   MCV 91.4 12/14/2014   PLT 281 12/14/2014    Lab Results  Component Value Date   CREATININE 1.27* 12/14/2014    No results found for: PSA  No results found for: TESTOSTERONE  No results found for: HGBA1C  Urinalysis    Component Value Date/Time   COLORURINE YELLOW* 12/09/2014 2144   APPEARANCEUR CLOUDY* 12/09/2014 2144   LABSPEC 1.018 12/09/2014 2144   PHURINE 5.0 12/09/2014 2144   GLUCOSEU Negative 12/25/2014 1153   HGBUR 3+* 12/09/2014 2144   BILIRUBINUR Negative 12/25/2014 1153   BILIRUBINUR NEGATIVE 12/09/2014 2144   KETONESUR NEGATIVE 12/09/2014 2144   PROTEINUR NEGATIVE 12/09/2014 2144   NITRITE Negative 12/25/2014 1153   NITRITE NEGATIVE 12/09/2014 2144   LEUKOCYTESUR Trace* 12/25/2014 1153   LEUKOCYTESUR 1+* 12/09/2014 2144    Pertinent Imaging: CLINICAL DATA: Kidney stones post lithotripsy 2 weeks ago, having hematuria and frequent urination, no pain today, diabetes mellitus, hypertension  EXAM: ABDOMEN - 1 VIEW  COMPARISON: 01/21/2015  FINDINGS: BILATERAL ureteral stents identified, unchanged.  Tiny BILATERAL nonobstructing renal  calculi.  Additionally, calcifications are seen adjacent to the LEFT ureteral stent, measuring 8 mm diameter at L5 and 3 mm diameter at the level of the inferior SI joint compatible with LEFT ureteral calculi.  Multiple calcifications are seen along the course of the proximal to mid RIGHT ureteral stent.  Bowel gas pattern normal.  Few pelvic phleboliths.  Bones demineralized with scoliosis and degenerative changes thoracolumbar spine.  Question prior LEFT inguinal herniorrhaphy.  IMPRESSION: BILATERAL ureteral stents.  Persistent visualization of BILATERAL ureteral calculi adjacent to the stents as above.  Additional tiny BILATERAL nonobstructing renal calculi.  Assessment & Plan:    1. Bilateral ureteral stones status post stents, staged lithotripsy I discussed the patient that he still has large volume of residual stone fragment bilaterally. He remains hesitant undergoing ureteroscopy which is why he underwent staged lithotripsy in the first place. I explained to him that with a large fragments in both ureters it would be unsafe to remove the stent to this time. He is not a good candidate for additional lithotripsy at this time. After much discussion, the patient agreeable to proceeding with bilateral ureteroscopy and laser lithotripsy. I discussed the risks, limits, and indications of this procedure. He understands the risks include, but are not limited to, bleeding, infection, and iatrogenic injury. He is agreeable to proceeding.   No Follow-up on file.  Fredrich Cory [place] Paster, MD  [facility] [address], [address] [place],  [address] [phone]   

## 2015-02-24 NOTE — Progress Notes (Signed)
eLink Physician-Brief Progress Note Patient Name: William PigeonJames B Bo Lozano. DOB: 09/13/1932 MRN: [id number]   Date of Service  02/24/2015  HPI/Events of Note  82 y.o. WM s/p lithotripsy. Known h/o atrial fibrillation. Patient comfortable on camera check with normal heart rate & BP. Hard stick for blood draws. Magnesium 0.1 likely not real.   eICU Interventions  Continue current plan of treatment. Awaiting repeat labs.     Intervention Category Evaluation Type: New Patient Evaluation  William Lozano 02/24/2015, 6:03 PM

## 2015-02-24 NOTE — Anesthesia Postprocedure Evaluation (Signed)
Anesthesia Post Note  Patient: William PigeonJames B Sides Sr.  Procedure(s) Performed: Procedure(s) (LRB): URETEROSCOPY WITH HOLMIUM LASER LITHOTRIPSY /STONE BASKETING (Bilateral) CYSTOSCOPY WITH STENT REPLACEMENT (Bilateral)  Patient location during evaluation: PACU Anesthesia Type: General Level of consciousness: awake Pain management: pain level controlled Vital Signs Assessment: post-procedure vital signs reviewed and stable Respiratory status: spontaneous breathing Cardiovascular status: stable Anesthetic complications: no    Last Vitals:  Filed Vitals:   02/24/15 0818 02/24/15 1118  BP: 135/55 116/81  Pulse: 87 95  Temp: 36.4 C 36.7 C  Resp: 18 18    Last Pain: There were no vitals filed for this visit.               VAN STAVEREN,Arling Cerone

## 2015-02-24 NOTE — Progress Notes (Signed)
Patient being adm to ICU 6--[facility]

## 2015-02-24 NOTE — Telephone Encounter (Signed)
-----   Message from Hildred LaserBrian Yoshua Budzyn, MD sent at 02/24/2015 11:19 AM EST ----- Patient needs to follow up in 2 weeks for stent removal. Thanks

## 2015-02-24 NOTE — H&P (Signed)
[facility] [place] [facility]   PATIENT NAME: William Lozano    MR#:  [id number]  DATE OF BIRTH:  May 01, 1932  DATE OF ADMISSION:  02/24/2015  PRIMARY CARE PHYSICIAN: SPARKS,JEFFREY D, MD   REQUESTING/REFERRING PHYSICIAN:   CHIEF COMPLAINT:  I do not feel good   HISTORY OF PRESENT ILLNESS: William Lozano  is a 82 y.o. male with a known history of kidney stones who just underwent bilateral ureteral stent replacement was noted to have A. fib, RVR to 170s postoperatively. Apparently at patient was woken up postoperatively and he complained that he just did not feel right. He denies any chest pains, however, noted to have A. fib, RVR, rate of as high as 170 intermittently and as low as 38. Upon further evaluation, it appeared that he had significant apnea episodes lasting longer than 25 seconds. Blood pressure ranged in normal range around 120 systolic. Patient denies any chest pains, shortness of breath, although admits of feeling lightheaded and dizzy, presyncopal . Admitted also of feeling numb and weak all over his body. Hospitalist services were contacted for admission.       PAST MEDICAL HISTORY:   Past Medical History  Diagnosis Date  . Essential hypertension   . Atrial fibrillation (HCC) 2016    paroxysmal atrial fibrillation, sick sinus syndrome  . Kidney stones   . Hyperlipemia   . Diabetes mellitus without complication (HCC)     type 2  . Dementia     alzheimers  . Arthritis     PAST SURGICAL HISTORY:  Past Surgical History  Procedure Laterality Date  . Hernia repair Bilateral     inguinal  . Cystoscopy with stent placement Bilateral 12/10/2014    Procedure: CYSTOSCOPY WITH STENT PLACEMENT;  Surgeon: Hildred Laser, MD;  Location: [facility] ORS;  Service: Urology;  Laterality: Bilateral;  . Cystoscopy with retrograde pyelogram, ureteroscopy and stent placement Bilateral 12/10/2014    Procedure: CYSTOSCOPY WITH RETROGRADE PYELOGRAM,  URETEROSCOPY AND STENT PLACEMENT;  Surgeon: Hildred Laser, MD;  Location: [facility] OR[facility];  Service: Urology;  Laterality: Bilateral;  . Extracorporeal shock wave lithotripsy Right 01/07/2015    Procedure: EXTRACORPOREAL SHOCK WAVE LITHOTRIPSY (ESWL);  Surgeon: Lorraine Lax, MD;  Location: [facility] ORS;  Service: Urology;  Laterality: Right;  . Extracorporeal shock wave lithotripsy Left 01/21/2015    Procedure: EXTRACORPOREAL SHOCK WAVE LITHOTRIPSY (ESWL);  Surgeon: Hildred Laser, MD;  Location: [facility] ORS;  Service: Urology;  Laterality: Left;  Marland Kitchen Eye surgery Bilateral     Cataract Extraction with IOL    SOCIAL HISTORY:  Social History  Substance Use Topics  . Smoking status: Never Smoker   . Smokeless tobacco: Never Used  . Alcohol Use: No    FAMILY HISTORY:  Family History  Problem Relation Age of Onset  . Hypertension    . Kidney disease Neg Hx   . Prostate cancer Neg Hx     DRUG ALLERGIES:  Allergies  Allergen Reactions  . Codeine Other (See Comments)    Causes side effects  . Diazepam Other (See Comments)  . Ibuprofen Other (See Comments)  . Penicillin G Rash  . Sulfa Antibiotics Rash    Review of Systems  Unable to perform ROS: mental status change  Respiratory: Negative for shortness of breath.   Cardiovascular: Negative for chest pain and palpitations.  Genitourinary: Positive for dysuria.    MEDICATIONS AT HOME:  Prior to Admission medications   Medication Sig Start Date End Date  Taking? Authorizing Provider  CIALIS 5 MG tablet TAKE AS NEEDED 12/19/14  Yes Historical Provider, MD  hydrochlorothiazide (HYDRODIURIL) 25 MG tablet Take 1 tablet (25 mg total) by mouth daily as needed. Patient taking differently: Take 25 mg by mouth daily.  12/14/14  Yes Marguarite Arbour, MD  metoprolol succinate (TOPROL-XL) 25 MG 24 hr tablet Take 25 mg by mouth daily.   Yes Historical Provider, MD  pantoprazole (PROTONIX) 40 MG tablet Take 40 mg by mouth daily.   Yes Historical  Provider, MD  simvastatin (ZOCOR) 20 MG tablet Take 20 mg by mouth daily.   Yes Historical Provider, MD  apixaban (ELIQUIS) 5 MG TABS tablet Take 5 mg by mouth 2 (two) times daily.    Historical Provider, MD  cefUROXime (CEFTIN) 250 MG tablet Take 1 tablet (250 mg total) by mouth 2 (two) times daily with a meal. Patient not taking: Reported on 12/25/2014 12/14/14   Marguarite Arbour, MD  ciprofloxacin (CIPRO) 500 MG tablet Take 1 tablet (500 mg total) by mouth 2 (two) times daily. 02/24/15   Hildred Laser, MD  fluticasone [facility]) 50 MCG/ACT nasal spray Place 2 sprays into both nostrils as needed. Reported on 02/24/2015    Historical Provider, MD  HYDROcodone-acetaminophen (NORCO/VICODIN) 5-325 MG tablet Take 1 tablet by mouth every 4 (four) hours as needed for moderate pain. Reported on 02/24/2015 02/24/15   Hildred Laser, MD  loratadine (SM LORATADINE) 5 MG/5ML syrup Take 5 mg by mouth daily as needed. Reported on 02/24/2015    Historical Provider, MD  ondansetron (ZOFRAN) 4 MG tablet Take 1 tablet (4 mg total) by mouth every 6 (six) hours as needed for nausea. Patient not taking: Reported on 02/04/2015 12/14/14   Marguarite Arbour, MD  promethazine (PHENERGAN) 12.5 MG tablet Take 12.5 mg by mouth every 6 (six) hours as needed for nausea or vomiting. Reported on 02/24/2015    Historical Provider, MD      PHYSICAL EXAMINATION:   VITAL SIGNS: Blood pressure 102/42, pulse 77, temperature 97.7 F (36.5 C), temperature source Oral, resp. rate 14, height 5\' 6"  (1.676 m), weight 68.493 kg (151 lb), SpO2 100 %.  GENERAL:  82 y.o.-year-old patient lying in the bed with no acute distress, very sleepy, although able to open his eyes and briefly converses. Apnea episodes on the monitor, lasting more than 25 seconds EYES: Pupils equal, round, reactive to light and accommodation. No scleral icterus. Extraocular muscles intact.  HEENT: Head atraumatic, normocephalic. Oropharynx and nasopharynx clear.  NECK:   Supple, no jugular venous distention. No thyroid enlargement, no tenderness.  LUNGS: Normal breath sounds bilaterally, no wheezing, rales,rhonchi or crepitation. No use of accessory muscles of respiration.  CARDIOVASCULAR: S1, S2 , irregularly irregular. No murmurs, rubs, or gallops.  ABDOMEN: Soft, tender diffusely, mostly in lower quadrants but no rebound or guarding were noted, nondistended. Bowel sounds present. No organomegaly or mass.  EXTREMITIES: No pedal edema, cyanosis, or clubbing.  NEUROLOGIC: Cranial nerves II through XII are intact. Muscle strength 5/5 in all extremities. Sensation grossly intact, but evaluation is limited due to somnolence. Gait not checked.  PSYCHIATRIC: The patient is very somnolent, able to briefly open his eyes and converse, answers questions appropriately  SKIN: No obvious rash, lesion, or ulcer.   LABORATORY PANEL:   CBC No results for input(s): WBC, HGB, HCT, PLT, MCV, MCH, MCHC, RDW, LYMPHSABS, MONOABS, EOSABS, BASOSABS, BANDABS in the last 168 hours.  Invalid input(s): NEUTRABS, BANDSABD ------------------------------------------------------------------------------------------------------------------  Chemistries  No results for input(s): NA, K, CL, CO2, GLUCOSE, BUN, CREATININE, CALCIUM, MG, AST, ALT, ALKPHOS, BILITOT in the last 168 hours.  Invalid input(s): GFRCGP ------------------------------------------------------------------------------------------------------------------  Cardiac Enzymes No results for input(s): TROPONINI in the last 168 hours. ------------------------------------------------------------------------------------------------------------------  RADIOLOGY: No results found.  EKG: Orders placed or performed during the hospital encounter of 02/24/15  . EKG 12-Lead  . EKG 12-Lead   EKG revealed A. fib at a rate of 10 4 bpm, normal axis, LVH per EKG criteria, but no acute ST-T changes IMPRESSION AND PLAN:  Active  Problems:   Atrial fibrillation with RVR (HCC) 1. Apnea episodes, likely due to high doses of fentanyl given perioperatively total of 200 g, admit patient to intensive care unit stepdown initiate him on Narcan intermittently. Continue monitor 2. A. fib, RVR with episodes of slow ventricular response, could be related to episodes of apnea, follow clinically. Initiate medications as needed,  3. Hyperglycemia. Get hemoglobin A1c to rule out diabetes 4. Dysuria. Get urine cultures and continue patient on ertapenem   All the records are reviewed and case discussed with ED provider. Management plans discussed with the patient, family and they are in agreement.  CODE STATUS:    TOTAL CRITICAL CARE TIME TAKING CARE OF THIS PATIENT: 60 minutes.    Katharina CaperVAICKUTE,Aira Sallade M.D on 02/24/2015 at 2:08 PM  Between 7am to 6pm - Pager - [phone] After 6pm go to [email or web address] - password [email or web address] [facility]  [facility]  Office  [phone]  CC: Primary care physician; Marguarite ArbourSPARKS,JEFFREY D, MD

## 2015-02-25 ENCOUNTER — Telehealth: Payer: Self-pay

## 2015-02-25 ENCOUNTER — Encounter: Payer: Self-pay | Admitting: Urology

## 2015-02-25 DIAGNOSIS — N201 Calculus of ureter: Secondary | ICD-10-CM | POA: Diagnosis not present

## 2015-02-25 LAB — GLUCOSE, CAPILLARY: Glucose-Capillary: 171 mg/dL — ABNORMAL HIGH (ref 65–99)

## 2015-02-25 LAB — BASIC METABOLIC PANEL
Anion gap: 9 (ref 5–15)
BUN: 28 mg/dL — AB (ref 6–20)
CHLORIDE: 108 mmol/L (ref 101–111)
CO2: 21 mmol/L — ABNORMAL LOW (ref 22–32)
CREATININE: 2.14 mg/dL — AB (ref 0.61–1.24)
Calcium: 8.7 mg/dL — ABNORMAL LOW (ref 8.9–10.3)
GFR calc Af Amer: 32 mL/min — ABNORMAL LOW (ref >60)
GFR calc non Af Amer: 27 mL/min — ABNORMAL LOW (ref >60)
GLUCOSE: 172 mg/dL — AB (ref 65–99)
POTASSIUM: 4.6 mmol/L (ref 3.5–5.1)
Sodium: 138 mmol/L (ref 135–145)

## 2015-02-25 LAB — HEMOGLOBIN A1C: HEMOGLOBIN A1C: 6.7 % — AB (ref 4.0–6.0)

## 2015-02-25 LAB — TROPONIN I: Troponin I: 0.05 ng/mL — ABNORMAL HIGH (ref <0.031)

## 2015-02-25 MED ORDER — SODIUM CHLORIDE 0.9 % IV BOLUS (SEPSIS)
500.0000 mL | Freq: Once | INTRAVENOUS | Status: AC
Start: 2015-02-25 — End: 2015-02-25
  Administered 2015-02-25: 500 mL via INTRAVENOUS

## 2015-02-25 MED ORDER — SODIUM CHLORIDE 0.9 % IV BOLUS (SEPSIS): 500.0000 mL | Freq: Once | INTRAVENOUS | Status: AC

## 2015-02-25 NOTE — Progress Notes (Signed)
   02/25/15 1300  Clinical Encounter Type  Visited With Patient  Visit Type Follow-up  Consult/Referral To Chaplain  Spiritual Encounters  Spiritual Needs Other (Comment)  Stress Factors  Patient Stress Factors None identified  Chaplain rounded in the unit and saw that the patient was being discharged. Provided support and assistance as he was checking his room and getting into the wheelchair. Chaplain Latriece Anstine A. Katheline Brendlinger Ext. [phone]

## 2015-02-25 NOTE — Addendum Note (Signed)
Addendum  created 02/25/15 [id number] by Mathews ArgyleBenjamin Mialynn Shelvin, CRNA   Modules edited: Clinical Notes   Clinical Notes:  File: [id number]

## 2015-02-25 NOTE — Progress Notes (Signed)
Patient is alert and oriented with periods of forgetfulness.  VSS.  Ambulated around ICU with no distress noted and tolerated well. Discharge instructions given to and reviewed with patient and William Lozano(friend/caregiver). Patient and William Norfolkolly both verbalized understanding of all discharge instructions including follow up appointments, meds, foley care and that home health nurse would be coming to assist with foley catheter care. RN changed patient's catheter bag to a leg bag with straps prior to discharge and instructed patient and William on use, using teach back method which patient and William demonstrated.  Additional drainage bag given to patient with measuring cup for foley care.  Patient left ICU by wheelchair with volunteer with no distress noted. William driving patient home and staying with patient to help care for him.

## 2015-02-25 NOTE — Care Management (Signed)
Patient is for discharge home today.  Attending will order home health nurse to monitor meds, heart rate and blood pressure.  No agency preference when reviews list of agencies.  [facility].  [facility]  Requested visit within 24 hours of discharge.  Prior to this admit  patient independent in all adls.  He has a significant other in the home.  Patient will discharge home with a foley cath due to retention.  Is to follow up with urology 1/9 to have it removed.  Attending states that if foley should be expelled prior to appointment- to allow patient chance to void before reinserting it.

## 2015-02-25 NOTE — Discharge Summary (Signed)
[facility] [place] [facility]   PATIENT NAME: William Lozano    MR#:  [id number]  DATE OF BIRTH:  01/11/1933  DATE OF ADMISSION:  02/24/2015 ADMITTING PHYSICIAN: Hildred LaserBrian Steen Budzyn, MD  DATE OF DISCHARGE: 02/25/2015  2:42 PM  PRIMARY CARE PHYSICIAN: SPARKS,JEFFREY D, MD    ADMISSION DIAGNOSIS:  bilateral ureteral stones  DISCHARGE DIAGNOSIS:  Active Problems:   Atrial fibrillation with RVR (HCC)   SECONDARY DIAGNOSIS:   Past Medical History  Diagnosis Date  . Essential hypertension   . Atrial fibrillation (HCC) 2016    paroxysmal atrial fibrillation, sick sinus syndrome  . Kidney stones   . Hyperlipemia   . Diabetes mellitus without complication (HCC)     type 2  . Dementia     alzheimers  . Arthritis     HOSPITAL COURSE:   [age] year old male with past medical history of chronic atrial fibrillation, history of nephrolithiasis, hyperlipidemia, diabetes, dementia, hypertension who presented to the hospital with urinary retention and noted to have nephrolithiasis and underwent bilateral ureteral stents and postoperatively noted to be in atrial fibrillation with rapid ventricular response.  #1 atrial fibrillation with rapid ventricular response-patient's heart rates have significantly improved since admission. He was never placed on a Cardizem drip but given just pulse doses of Cardizem and resume back on his Toprol. Sign -I suspect this is probably related to the recent urologic procedure. Patient will continue his oral Toprol and follow up with his cardiologist as an outpatient. He will resume his Eliquis upon discharge  #2 acute renal failure-this was likely postobstructive in nature given his urinary retention and bilateral ureteral stones. -Patient has a Foley catheter in place and is voiding well and is going home with the Foley catheter. His renal function can be checked as an outpatient within the next week.  #3 Urinary Retention s/p b/l  Ureteral stent placement - pt. Currently is voiding well and has a foley catheter in place.  He will go home with foley catheter and have it pulled out by his Urologist next week.    #4 GERD - he will cont. With Protonix.   #5 HTN - cont. HCTZ, Toprol.    Pt. Is being discharged home.   DISCHARGE CONDITIONS:   Stable  CONSULTS OBTAINED:  Treatment Team:  Hildred LaserBrian Omarri Budzyn, MD  DRUG ALLERGIES:   Allergies  Allergen Reactions  . Codeine Other (See Comments)    Causes side effects  . Diazepam Other (See Comments)  . Ibuprofen Other (See Comments)  . Penicillin G Rash  . Sulfa Antibiotics Rash    DISCHARGE MEDICATIONS:   Discharge Medication List as of 02/25/2015 12:58 PM    START taking these medications   Details  ciprofloxacin (CIPRO) 500 MG tablet Take 1 tablet (500 mg total) by mouth 2 (two) times daily., Starting 02/24/2015, Until Discontinued, Print      CONTINUE these medications which have CHANGED   Details  HYDROcodone-acetaminophen (NORCO/VICODIN) 5-325 MG tablet Take 1 tablet by mouth every 4 (four) hours as needed for moderate pain. Reported on 02/24/2015, Starting 02/24/2015, Until Discontinued, Print      CONTINUE these medications which have NOT CHANGED   Details  apixaban (ELIQUIS) 5 MG TABS tablet Take 5 mg by mouth 2 (two) times daily., Until Discontinued, Historical Med    CIALIS 5 MG tablet TAKE AS NEEDED, Historical Med    hydrochlorothiazide (HYDRODIURIL) 25 MG tablet Take 1 tablet (25 mg total) by mouth daily as  needed., Starting 12/14/2014, Until Discontinued, Print    metoprolol succinate (TOPROL-XL) 25 MG 24 hr tablet Take 25 mg by mouth daily., Until Discontinued, Historical Med    pantoprazole (PROTONIX) 40 MG tablet Take 40 mg by mouth daily., Until Discontinued, Historical Med    simvastatin (ZOCOR) 20 MG tablet Take 20 mg by mouth daily., Until Discontinued, Historical Med    cefUROXime (CEFTIN) 250 MG tablet Take 1 tablet (250 mg total) by  mouth 2 (two) times daily with a meal., Starting 12/14/2014, Until Discontinued, Print    fluticasone (FLONASE) 50 MCG/ACT nasal spray Place 2 sprays into both nostrils as needed. Reported on 02/24/2015, Until Discontinued, Historical Med    loratadine (SM LORATADINE) 5 MG/5ML syrup Take 5 mg by mouth daily as needed. Reported on 02/24/2015, Until Discontinued, Historical Med    ondansetron (ZOFRAN) 4 MG tablet Take 1 tablet (4 mg total) by mouth every 6 (six) hours as needed for nausea., Starting 12/14/2014, Until Discontinued, Print    promethazine (PHENERGAN) 12.5 MG tablet Take 12.5 mg by mouth every 6 (six) hours as needed for nausea or vomiting. Reported on 02/24/2015, Until Discontinued, Historical Med         DISCHARGE INSTRUCTIONS:   DIET:  Cardiac diet  DISCHARGE CONDITION:  Stable  ACTIVITY:  Activity as tolerated  OXYGEN:  Home Oxygen: No.   Oxygen Delivery: room air  DISCHARGE LOCATION:  Home with home health nursing   If you experience worsening of your admission symptoms, develop shortness of breath, life threatening emergency, suicidal or homicidal thoughts you must seek medical attention immediately by calling 911 or calling your MD immediately  if symptoms less severe.  You Must read complete instructions/literature along with all the possible adverse reactions/side effects for all the Medicines you take and that have been prescribed to you. Take any new Medicines after you have completely understood and accpet all the possible adverse reactions/side effects.   Please note  You were cared for by a hospitalist during your hospital stay. If you have any questions about your discharge medications or the care you received while you were in the hospital after you are discharged, you can call the unit and asked to speak with the hospitalist on call if the hospitalist that took care of you is not available. Once you are discharged, your primary care physician will handle  any further medical issues. Please note that NO REFILLS for any discharge medications will be authorized once you are discharged, as it is imperative that you return to your primary care physician (or establish a relationship with a primary care physician if you do not have one) for your aftercare needs so that they can reassess your need for medications and monitor your lab values.     Today   Heart rates are stable but still in A. fib. Family at bedside. No abdominal pain. No fever, nausea, vomiting.  VITAL SIGNS:  Blood pressure 116/79, pulse 92, temperature 98.5 F (36.9 C), temperature source Oral, resp. rate 21, height 5\' 6"  (1.676 m), weight 68.493 kg (151 lb), SpO2 95 %.  I/O:   Intake/Output Summary (Last 24 hours) at 02/25/15 1650 Last data filed at 02/25/15 1300  Gross per 24 hour  Intake   3517 ml  Output    570 ml  Net   2947 ml    PHYSICAL EXAMINATION:  GENERAL:  82 y.o.-year-old patient lying in the bed with no acute distress.  EYES: Pupils equal, round, reactive to light  and accommodation. No scleral icterus. Extraocular muscles intact.  HEENT: Head atraumatic, normocephalic. Oropharynx and nasopharynx clear.  NECK:  Supple, no jugular venous distention. No thyroid enlargement, no tenderness.  LUNGS: Normal breath sounds bilaterally, no wheezing, rales,rhonchi. No use of accessory muscles of respiration.  CARDIOVASCULAR: S1, S2 normal. No murmurs, rubs, or gallops.  ABDOMEN: Soft, non-tender, non-distended. Bowel sounds present. No organomegaly or mass.  EXTREMITIES: No pedal edema, cyanosis, or clubbing.  NEUROLOGIC: Cranial nerves II through XII are intact. No focal motor or sensory defecits b/l.  PSYCHIATRIC: The patient is alert and oriented x 1. Dementia SKIN: No obvious rash, lesion, or ulcer.   Foley catheter in place with yellow urine draining.  DATA REVIEW:   CBC  Recent Labs Lab 02/24/15 2127  WBC 17.1*  HGB 14.8  HCT 45.2  PLT 260     Chemistries   Recent Labs Lab 02/24/15 1759 02/25/15 0403  NA 143 138  K 4.6 4.6  CL 113* 108  CO2 19* 21*  GLUCOSE 199* 172*  BUN 24* 28*  CREATININE 1.62* 2.14*  CALCIUM 9.2 8.7*  MG 1.8  --   AST 30  --   ALT 13*  --   ALKPHOS 62  --   BILITOT 1.4*  --     Cardiac Enzymes  Recent Labs Lab 02/25/15 0403  TROPONINI 0.05*    Microbiology Results  Results for orders placed or performed during the hospital encounter of 02/24/15  MRSA PCR Screening     Status: None   Collection Time: 02/24/15  3:24 PM  Result Value Ref Range Status   MRSA by PCR NEGATIVE NEGATIVE Final    Comment:        The GeneXpert MRSA Assay (FDA approved for NASAL specimens only), is one component of a comprehensive MRSA colonization surveillance program. It is not intended to diagnose MRSA infection nor to guide or monitor treatment for MRSA infections.   Urine culture     Status: None (Preliminary result)   Collection Time: 02/24/15 11:42 PM  Result Value Ref Range Status   Specimen Description URINE, CLEAN CATCH  Final   Special Requests Normal  Final   Culture NO GROWTH < 24 HOURS  Final   Report Status PENDING  Incomplete    RADIOLOGY:  No results found.    Management plans discussed with the patient, family and they are in agreement.  CODE STATUS:     Code Status Orders        Start     Ordered   02/24/15 1524  Full code   Continuous     02/24/15 1523      TOTAL TIME TAKING CARE OF THIS PATIENT: 40 minutes.    Houston Siren M.D on 02/25/2015 at 4:50 PM  Between 7am to 6pm - Pager - [phone]  After 6pm go to [email or web address] - password [email or web address] [facility]  [facility]  Office  [phone]  CC: Primary care physician; Marguarite Arbour, MD

## 2015-02-25 NOTE — Discharge Instructions (Signed)
AMBULATORY SURGERY  DISCHARGE INSTRUCTIONS   1) The drugs that you were given will stay in your system until tomorrow so for the next 24 hours you should not:  A) Drive an automobile B) Make any legal decisions C) Drink any alcoholic beverage   2) You may resume regular meals tomorrow.  Today it is better to start with liquids and gradually work up to solid foods.  You may eat anything you prefer, but it is better to start with liquids, then soup and crackers, and gradually work up to solid foods.   3) Please notify your doctor immediately if you have any unusual bleeding, trouble breathing, redness and pain at the surgery site, drainage, fever, or pain not relieved by medication.    4) Additional Instructions:        Please contact your physician with any problems or Same Day Surgery at [phone], Monday through Friday 6 am to 4 pm, or  at [facility] Main number at [phone].Ureteral Stent Implantation, Care After Refer to this sheet in the next few weeks. These instructions provide you with information on caring for yourself after your procedure. Your health care provider may also give you more specific instructions. Your treatment has been planned according to current medical practices, but problems sometimes occur. Call your health care provider if you have any problems or questions after your procedure. WHAT TO EXPECT AFTER THE PROCEDURE You should be back to normal activity within 48 hours after the procedure. Nausea and vomiting may occur and are commonly the result of anesthesia. It is common to experience sharp pain in the back or lower abdomen and penis with voiding. This is caused by movement of the ends of the stent with the act of urinating.It usually goes away within minutes after you have stopped urinating. HOME CARE INSTRUCTIONS Make sure to drink plenty of fluids. You may have small amounts of bleeding, causing your urine to be red. This is normal.  Certain movements may trigger pain or a feeling that you need to urinate. You may be given medicines to prevent infection or bladder spasms. Be sure to take all medicines as directed. Only take over-the-counter or prescription medicines for pain, discomfort, or fever as directed by your health care provider. Do not take aspirin, as this can make bleeding worse. Your stent will be left in until the blockage is resolved. This may take 2 weeks or longer, depending on the reason for stent implantation. You may have an X-ray exam to make sure your ureter is open and that the stent has not moved out of position (migrated). The stent can be removed by your health care provider in the office. Medicines may be given for comfort while the stent is being removed. Be sure to keep all follow-up appointments so your health care provider can check that you are healing properly. SEEK MEDICAL CARE IF:  You experience increasing pain.  Your pain medicine is not working. SEEK IMMEDIATE MEDICAL CARE IF:  Your urine is dark red or has blood clots.  You are leaking urine (incontinent).  You have a fever, chills, feeling sick to your stomach (nausea), or vomiting.  Your pain is not relieved by pain medicine.  The end of the stent comes out of the urethra.  You are unable to urinate.   This information is not intended to replace advice given to you by your health care provider. Make sure you discuss any questions you have with your health care provider.  Document Released: 10/09/2012 Document Revised: 02/11/2013 Document Reviewed: 08/21/2014 Elsevier Interactive Patient Education 2016 [employer]   [facility] WILL PROVIDE [facility] HEALTH NURSE.  AGENCY WILL MAKE INITIAL VISIT 02/26/2015.  CALL [phone] OR [phone]

## 2015-02-25 NOTE — Progress Notes (Signed)
   02/25/15 1100  Clinical Encounter Type  Visited With Patient;Patient and family together  Visit Type Initial  Consult/Referral To Chaplain  Spiritual Encounters  Spiritual Needs Emotional  Stress Factors  Patient Stress Factors Health changes  Chaplain rounded in the unit and stopped in to offer a compassionate presence and conversation. The patient appeared to appreciate the time spent together. We discussed his life, age, losses and family achievements. Chaplain Takyah Ciaramitaro A. Charnele Semple Ext. [phone]

## 2015-02-25 NOTE — Progress Notes (Signed)
Inpatient Diabetes Program Recommendations  AACE/ADA: New Consensus Statement on Inpatient Glycemic Control (2015)  Target Ranges:  Prepandial:   less than 140 mg/dL      Peak postprandial:   less than 180 mg/dL (1-2 hours)      Critically ill patients:  140 - 180 mg/dL   Review of Glycemic Control- lab glucose  Results for [name], William Lozano. (MRN [id number]) as of 02/25/2015 13:00  Ref. Range 02/24/2015 15:34 02/24/2015 17:59 02/24/2015 21:27 02/24/2015 23:42 02/25/2015 04:03  Glucose Latest Ref Range: 65-99 mg/dL  409199 (H)   811172 (H)    Diabetes history: MD note states he has Type 2 Outpatient Diabetes medications: none Current orders for Inpatient glycemic control: none  Inpatient Diabetes Program Recommendations: A1C 6.7%   Discussed diabetes history with RN [name]- Asked her to speak to patient and encourage him to check his blood sugar if he has a meter at home and to discuss blood sugar control with MD at his next MD visit.   [name], RN, BA, MHA, CDE Diabetes Coordinator Inpatient Diabetes Program  [phone] (Team Pager) [phone] [facility] Office) 02/25/2015 1:06 PM

## 2015-02-25 NOTE — Progress Notes (Signed)
Patient admitted post op with Afib with RVR Foley placed for 200 cc this am No other events overnight HR better controlled  Filed Vitals:   02/25/15 0600 02/25/15 0700 02/25/15 0800 02/25/15 0825  BP: 107/61 103/57 116/71   Pulse: 102 85 84   Temp:    97.7 F (36.5 C)  TempSrc:    Oral  Resp: 18 30 20    Height:      Weight:      SpO2: 97% 98% 96%    I/O last 3 completed shifts: In: 3622.8 [P.O.:397; I.V.:2525.8; IV Piggyback:700] Out: 270 [Urine:270] Total I/O In: 440 [I.V.:240; IV Piggyback:200] Out: -    NAD HR:100s on tele Soft NT ND Foley clear yellow urine  CBC    Component Value Date/Time   WBC 8.4 02/12/2015 0925   RBC 4.77 02/12/2015 0925   HGB 14.3 02/12/2015 0925   HCT 43.0 02/12/2015 0925   PLT 230 02/12/2015 0925   MCV 90.2 02/12/2015 0925   MCH 29.9 02/12/2015 0925   MCHC 33.1 02/12/2015 0925   RDW 14.6* 02/12/2015 0925   LYMPHSABS 1.2 02/12/2015 0925   MONOABS 0.8 02/12/2015 0925   EOSABS 0.5 02/12/2015 0925   BASOSABS 0.1 02/12/2015 0925    BMP Latest Ref Rng 02/25/2015 02/24/2015 02/12/2015  Glucose 65 - 99 mg/dL 161(W172(H) 960(A199(H) 540(J123(H)  BUN 6 - 20 mg/dL 81(X28(H) 91(Y24(H) 78(G24(H)  Creatinine 0.61 - 1.24 mg/dL 9.56(O2.14(H) 1.30(Q1.62(H) 6.571.23  Sodium 135 - 145 mmol/L 138 143 141  Potassium 3.5 - 5.1 mmol/L 4.6 4.6 3.5  Chloride 101 - 111 mmol/L 108 113(H) 103  CO2 22 - 32 mmol/L 21(L) 19(L) 32  Calcium 8.9 - 10.3 mg/dL 8.4(O8.7(L) 9.2 9.8    POD 1 cysto, blt URS, laser litho, stent exchange. Admitted for afib with RVR -continue foley for now. If patient goes home today, patient can f/u Monday for foley removal. If patient stays overnight, d/c foley tomorrow -appreciate cardiac management by hospitaltist -ok for d/c home from urology standpoint -will follow while in house

## 2015-02-25 NOTE — Progress Notes (Signed)
[facility] Physician-Brief Progress Note Patient Name: William PigeonJames B Luckey Sr. DOB: 07/10/1932 MRN: [id number]   Date of Service  02/25/2015  HPI/Events of Note  Low urine output. Borderline low BP -- responded earlier to fluid bolus  eICU Interventions  Will give 500 cc  Bolus of NS, continue IVF.      Intervention Category Major Interventions: Hypotension - evaluation and management  Arnetta Odeh 02/25/2015, 1:25 AM

## 2015-02-25 NOTE — Care Management Obs Status (Signed)
MEDICARE OBSERVATION STATUS NOTIFICATION   Patient Details  Name: William PigeonJames B Hosea Sr. MRN: [id number] Date of Birth: 08/20/1932   Medicare Observation Status Notification Given:  Yes    [name], Zenab Gronewold R, RN 02/25/2015, 11:26 AM

## 2015-02-25 NOTE — Progress Notes (Signed)
Foley insertion  Order given to insert foley by Dr. Nicholos Johnsamachandran from [facility].  Inserted using sterile technique.  Went very slowly while inserting and patient tolerated well but when got to where the foley enters the bladder the foley bounced back a little and it took a few bounces before foley entered bladder with immediate tea colored urine returning in catheter tubing.  Foley secured to leg with a safety device.

## 2015-02-25 NOTE — Care Management (Signed)
Placed in observation for atrial fib after bilateral ureteral stent placement. .  He has required IV fluid boluses for systolic blood pressures in the 70s.  Heart rate at 0400 this morning 150.

## 2015-02-25 NOTE — Progress Notes (Signed)
Dr. Eber JonesPredeep Ramachandran was notified throughout the night concerning the patients low urine output and bladder scanning results.  After given boluses throughout the night patient still unable to void but a trickle twice.  Dr. Nicholos Johnsamachandran at [phone] was notified and an order placed to insert a foley.  I asked if he would rather I/O cath but he said no he would just insert a foley.  Order in and completed.

## 2015-02-25 NOTE — Anesthesia Postprocedure Evaluation (Signed)
Anesthesia Post Note  Patient: William PigeonJames B Simao Sr.  Procedure(s) Performed: Procedure(s) (LRB): URETEROSCOPY WITH HOLMIUM LASER LITHOTRIPSY /STONE BASKETING (Bilateral) CYSTOSCOPY WITH STENT REPLACEMENT (Bilateral)  Patient location during evaluation: ICU Anesthesia Type: General Level of consciousness: awake Pain management: satisfactory to patient Vital Signs Assessment: post-procedure vital signs reviewed and stable Respiratory status: spontaneous breathing Cardiovascular status: stable Anesthetic complications: no    Last Vitals:  Filed Vitals:   02/25/15 0600 02/25/15 0700  BP: 107/61 103/57  Pulse: 102 85  Temp:    Resp: 18 30    Last Pain:  Filed Vitals:   02/25/15 0711  PainSc: 8                  Mathews ArgyleLogan,  Frank Pilger P

## 2015-02-25 NOTE — Telephone Encounter (Signed)
-----   Message from Hildred LaserBrian Daleon Budzyn, MD sent at 02/25/2015 10:11 AM EST ----- Patient needs nurse visit Monday for foley removal/trial of void. He still needs to see me 2 weeks for cysto stent removal in the office. thanks

## 2015-02-26 LAB — URINE CULTURE
CULTURE: NO GROWTH
SPECIAL REQUESTS: NORMAL

## 2015-03-01 ENCOUNTER — Ambulatory Visit: Payer: Medicare Other

## 2015-03-01 ENCOUNTER — Ambulatory Visit: Payer: Medicare Other | Admitting: Obstetrics and Gynecology

## 2015-03-02 ENCOUNTER — Ambulatory Visit (INDEPENDENT_AMBULATORY_CARE_PROVIDER_SITE_OTHER): Payer: Medicare Other | Admitting: Obstetrics and Gynecology

## 2015-03-02 ENCOUNTER — Encounter: Payer: Self-pay | Admitting: Obstetrics and Gynecology

## 2015-03-02 VITALS — BP 134/87 | HR 83 | Resp 16 | Ht 66.0 in | Wt 155.5 lb

## 2015-03-02 DIAGNOSIS — N200 Calculus of kidney: Secondary | ICD-10-CM

## 2015-03-02 LAB — BLADDER SCAN AMB NON-IMAGING: Scan Result: 97

## 2015-03-02 NOTE — Progress Notes (Signed)
03/02/2015 12:27 PM   William PigeonJames B Messler Sr. 08/31/1932 [id number]  Referring provider: Marguarite ArbourJeffrey D Sparks, MD [address] [facility] [place], [place] [id number]  Chief Complaint  Patient presents with  . Nephrolithiasis  . OTHER    Foley cath removal.    HPI: Patient is an 82 year old male with a history of bilateral ureteral stones status post cystoscopy, bilateral ureteroscopy, laser lithotripsy, stone basketing ,  Bilateral retrograde pyelogram and bilateral ureteral stent exchange on 02/24/15.   He was admitted postoperatively with atrial fibrillation with RVR. He subsequently developed urinary retention and a Foley catheter was placed. Otherwise his postoperative course was uneventful. He presents today for catheter removal and voiding trial. He reports dark tea-colored urine draining well from his catheter. No blood clots. He denies any fevers abdominal or flank pain.     PMH: Past Medical History  Diagnosis Date  . Essential hypertension   . Atrial fibrillation (HCC) 2016    paroxysmal atrial fibrillation, sick sinus syndrome  . Kidney stones   . Hyperlipemia   . Diabetes mellitus without complication (HCC)     type 2  . Dementia     alzheimers  . Arthritis     Surgical History: Past Surgical History  Procedure Laterality Date  . Hernia repair Bilateral     inguinal  . Cystoscopy with stent placement Bilateral 12/10/2014    Procedure: CYSTOSCOPY WITH STENT PLACEMENT;  Surgeon: Hildred LaserBrian Sayyid Budzyn, MD;  Location: [facility];  Service: Urology;  Laterality: Bilateral;  . Cystoscopy with retrograde pyelogram, ureteroscopy and stent placement Bilateral 12/10/2014    Procedure: CYSTOSCOPY WITH RETROGRADE PYELOGRAM, URETEROSCOPY AND STENT PLACEMENT;  Surgeon: Hildred LaserBrian Larone Budzyn, MD;  Location: [facility];  Service: Urology;  Laterality: Bilateral;  . Extracorporeal shock wave lithotripsy Right 01/07/2015    Procedure: EXTRACORPOREAL SHOCK WAVE LITHOTRIPSY (ESWL);   Surgeon: [name], MD;  Location: [facility];  Service: Urology;  Laterality: Right;  . Extracorporeal shock wave lithotripsy Left 01/21/2015    Procedure: EXTRACORPOREAL SHOCK WAVE LITHOTRIPSY (ESWL);  Surgeon: Hildred LaserBrian Alezander Budzyn, MD;  Location: [facility];  Service: Urology;  Laterality: Left;  Marland Kitchen. Eye surgery Bilateral     Cataract Extraction with IOL  . Ureteroscopy with holmium laser lithotripsy Bilateral 02/24/2015    Procedure: URETEROSCOPY WITH HOLMIUM LASER LITHOTRIPSY /STONE BASKETING;  Surgeon: Hildred LaserBrian Makell Budzyn, MD;  Location: [facility];  Service: Urology;  Laterality: Bilateral;  . Cystoscopy w/ ureteral stent placement Bilateral 02/24/2015    Procedure: CYSTOSCOPY WITH STENT REPLACEMENT;  Surgeon: Hildred LaserBrian Tramel Budzyn, MD;  Location: [facility];  Service: Urology;  Laterality: Bilateral;    Home Medications:    Medication List       This list is accurate as of: 03/02/15 12:27 PM.  Always use your most recent med list.               apixaban 5 MG Tabs tablet  Commonly known as:  ELIQUIS  Take 5 mg by mouth 2 (two) times daily.     cefUROXime 250 MG tablet  Commonly known as:  CEFTIN  Take 1 tablet (250 mg total) by mouth 2 (two) times daily with a meal.     CIALIS 5 MG tablet  Generic drug:  tadalafil  Reported on 03/02/2015     hydrochlorothiazide 25 MG tablet  Commonly known as:  HYDRODIURIL  Take 1 tablet (25 mg total) by mouth daily as needed.     HYDROcodone-acetaminophen 5-325 MG tablet  Commonly known as:  NORCO/VICODIN  Take 1 tablet by mouth every 4 (four) hours as needed for moderate pain. Reported on 02/24/2015     metoprolol succinate 25 MG 24 hr tablet  Commonly known as:  TOPROL-XL  Take 25 mg by mouth daily.     ondansetron 4 MG tablet  Commonly known as:  ZOFRAN  Take 1 tablet (4 mg total) by mouth every 6 (six) hours as needed for nausea.     pantoprazole 40 MG tablet  Commonly known as:  PROTONIX  Take 40 mg by mouth daily.     promethazine 12.5  MG tablet  Commonly known as:  PHENERGAN  Take 12.5 mg by mouth every 6 (six) hours as needed for nausea or vomiting. Reported on 02/24/2015     simvastatin 20 MG tablet  Commonly known as:  ZOCOR  Take 20 mg by mouth daily.     SM LORATADINE 5 MG/5ML syrup  Generic drug:  loratadine  Take 5 mg by mouth daily as needed. Reported on 02/24/2015        Allergies:  Allergies  Allergen Reactions  . Codeine Other (See Comments)    Causes side effects  . Diazepam Other (See Comments)  . Ibuprofen Other (See Comments)  . Penicillin G Rash  . Sulfa Antibiotics Rash    Family History: Family History  Problem Relation Age of Onset  . Hypertension    . Kidney disease Neg Hx   . Prostate cancer Neg Hx     Social History:  reports that he has never smoked. He has never used smokeless tobacco. He reports that he does not drink alcohol or use illicit drugs.  ROS: UROLOGY Frequent Urination?: No Hard to postpone urination?: No Burning/pain with urination?: Yes Get up at night to urinate?: No Leakage of urine?: No Urine stream starts and stops?: No Trouble starting stream?: No Do you have to strain to urinate?: No Blood in urine?: Yes Urinary tract infection?: No Sexually transmitted disease?: No Injury to kidneys or bladder?: No Painful intercourse?: No Weak stream?: No Erection problems?: No Penile pain?: No  Gastrointestinal Nausea?: No Vomiting?: No Indigestion/heartburn?: No Diarrhea?: No Constipation?: No  Constitutional Fever: No Night sweats?: No Weight loss?: No Fatigue?: No  Skin Skin rash/lesions?: No Itching?: No  Eyes Blurred vision?: No Double vision?: No  Ears/Nose/Throat Sore throat?: No Sinus problems?: No  Hematologic/Lymphatic Swollen glands?: No Easy bruising?: No  Cardiovascular Leg swelling?: No Chest pain?: No  Respiratory Cough?: Yes Shortness of breath?: No  Endocrine Excessive thirst?: No  Musculoskeletal Back pain?:  No Joint pain?: No  Neurological Headaches?: No Dizziness?: No  Psychologic Depression?: No Anxiety?: No  Physical Exam: BP 134/87 mmHg  Pulse 83  Resp 16  Ht 5\' 6"  (1.676 m)  Wt 155 lb 8 oz (70.534 kg)  BMI 25.11 kg/m2  Constitutional:  Alert and oriented, No acute distress. HEENT: [place] AT, moist mucus membranes.  Trachea midline, no masses. Cardiovascular: No clubbing, cyanosis, or edema. Respiratory: Normal respiratory effort, no increased work of breathing. GU: No CVA tenderness, foley draining dark tea colored urine, no blood clots Skin: No rashes, bruises or suspicious lesions. Neurologic: Grossly intact, no focal deficits, moving all 4 extremities. Psychiatric: Normal mood and affect.  Laboratory Data:   Urinalysis    Component Value Date/Time   COLORURINE RED* 02/18/2015 1002   APPEARANCEUR CLOUDY* 02/18/2015 1002   LABSPEC 1.015 02/18/2015 1002   PHURINE 6.0 02/18/2015 1002   GLUCOSEU NEGATIVE  02/18/2015 1002   HGBUR 2+* 02/18/2015 1002   BILIRUBINUR NEGATIVE 02/18/2015 1002   BILIRUBINUR Negative 12/25/2014 1153   KETONESUR NEGATIVE 02/18/2015 1002   PROTEINUR 100* 02/18/2015 1002   NITRITE NEGATIVE 02/18/2015 1002   NITRITE Negative 12/25/2014 1153   LEUKOCYTESUR NEGATIVE 02/18/2015 1002   LEUKOCYTESUR Trace* 12/25/2014 1153    Pertinent Imaging:   Assessment & Plan:   1. Urinary retention- Foley catheter removed today for voiding trial. Patient will return in 3:30 for PVR. He has an appointment for ureteral stent 2 removal in a few weeks.  There are no diagnoses linked to this encounter.  Return for as scheduled for stent removal.  These notes generated with voice recognition software. I apologize for typographical errors.  Earlie Lou, FNP  [facility] [address], [address] [place], [place] [phone]

## 2015-03-02 NOTE — Addendum Note (Signed)
Addended by: Avie EchevariaMOORE, Regginald Pask S on: 03/02/2015 03:11 PM   Modules accepted: Orders

## 2015-03-02 NOTE — Progress Notes (Signed)
Catheter Removal  Patient is present today for a catheter removal.  10ml of water was drained from the balloon. A 18FR foley cath was removed from the bladder no complications were noted . Patient tolerated well.  Preformed by: Natividad BroodSteve Wyona Neils, CMA  Follow up/ Additional notes: Pt to return around 3PM for a bladder scan.

## 2015-03-03 ENCOUNTER — Emergency Department
Admission: EM | Admit: 2015-03-03 | Discharge: 2015-03-03 | Disposition: A | Discharge status: Home / Self Care | Payer: Medicare Other | Attending: Emergency Medicine | Admitting: Emergency Medicine

## 2015-03-03 DIAGNOSIS — I10 Essential (primary) hypertension: Secondary | ICD-10-CM | POA: Diagnosis not present

## 2015-03-03 DIAGNOSIS — G309 Alzheimer's disease, unspecified: Secondary | ICD-10-CM | POA: Insufficient documentation

## 2015-03-03 DIAGNOSIS — E119 Type 2 diabetes mellitus without complications: Secondary | ICD-10-CM | POA: Insufficient documentation

## 2015-03-03 DIAGNOSIS — R339 Retention of urine, unspecified: Secondary | ICD-10-CM | POA: Insufficient documentation

## 2015-03-03 DIAGNOSIS — F0280 Dementia in other diseases classified elsewhere without behavioral disturbance: Secondary | ICD-10-CM | POA: Diagnosis not present

## 2015-03-03 DIAGNOSIS — Z79899 Other long term (current) drug therapy: Secondary | ICD-10-CM | POA: Diagnosis not present

## 2015-03-03 DIAGNOSIS — Z880 Allergy status to penicillin: Secondary | ICD-10-CM | POA: Diagnosis not present

## 2015-03-03 DIAGNOSIS — R338 Other retention of urine: Secondary | ICD-10-CM

## 2015-03-03 LAB — BASIC METABOLIC PANEL
ANION GAP: 9 (ref 5–15)
BUN: 23 mg/dL — AB (ref 6–20)
CALCIUM: 9.7 mg/dL (ref 8.9–10.3)
CO2: 27 mmol/L (ref 22–32)
CREATININE: 1.5 mg/dL — AB (ref 0.61–1.24)
Chloride: 101 mmol/L (ref 101–111)
GFR calc Af Amer: 49 mL/min — ABNORMAL LOW (ref >60)
GFR, EST NON AFRICAN AMERICAN: 42 mL/min — AB (ref >60)
GLUCOSE: 169 mg/dL — AB (ref 65–99)
Potassium: 4 mmol/L (ref 3.5–5.1)
Sodium: 137 mmol/L (ref 135–145)

## 2015-03-03 LAB — URINALYSIS COMPLETE WITH MICROSCOPIC (ARMC ONLY)
BILIRUBIN URINE: NEGATIVE
GLUCOSE, UA: 50 mg/dL — AB
KETONES UR: NEGATIVE mg/dL
Leukocytes, UA: NEGATIVE
Nitrite: NEGATIVE
Protein, ur: 100 mg/dL — AB
SQUAMOUS EPITHELIAL / LPF: NONE SEEN
Specific Gravity, Urine: 1.016 (ref 1.005–1.030)
pH: 6 (ref 5.0–8.0)

## 2015-03-03 MED ORDER — CEPHALEXIN 250 MG PO CAPS: 250.0000 mg | ORAL_CAPSULE | Freq: Four times a day (QID) | ORAL | Status: DC

## 2015-03-03 MED ORDER — CEPHALEXIN 250 MG PO CAPS
250.0000 mg | ORAL_CAPSULE | Freq: Once | ORAL | Status: AC
  Administered 2015-03-03: 250 mg via ORAL
  Filled 2015-03-03: qty 1

## 2015-03-03 NOTE — Discharge Instructions (Signed)
Acute Urinary Retention, Male °Acute urinary retention is the temporary inability to urinate. °This is a common problem in older men. As men age their prostates become larger and block the flow of urine from the bladder. This is usually a problem that has come on gradually.  °HOME CARE INSTRUCTIONS °If you are sent home with a Foley catheter and a drainage system, you will need to discuss the best course of action with your health care provider. While the catheter is in, maintain a good intake of fluids. Keep the drainage bag emptied and lower than your catheter. This is so that contaminated urine will not flow back into your bladder, which could lead to a urinary tract infection. °There are two main types of drainage bags. One is a large bag that usually is used at night. It has a good capacity that will allow you to sleep through the night without having to empty it. The second type is called a leg bag. It has a smaller capacity, so it needs to be emptied more frequently. However, the main advantage is that it can be attached by a leg strap and can go underneath your clothing, allowing you the freedom to move about or leave your home. °Only take over-the-counter or prescription medicines for pain, discomfort, or fever as directed by your health care provider.  °SEEK MEDICAL CARE IF: °· You develop a low-grade fever. °· You experience spasms or leakage of urine with the spasms. °SEEK IMMEDIATE MEDICAL CARE IF:  °· You develop chills or fever. °· Your catheter stops draining urine. °· Your catheter falls out. °· You start to develop increased bleeding that does not respond to rest and increased fluid intake. °MAKE SURE YOU: °· Understand these instructions. °· Will watch your condition. °· Will get help right away if you are not doing well or get worse. °  °This information is not intended to replace advice given to you by your health care provider. Make sure you discuss any questions you have with your health care  provider. °  °Document Released: 05/15/2000 Document Revised: 06/23/2014 Document Reviewed: 07/18/2012 °Elsevier Interactive Patient Education ©2016 [employer] ° °

## 2015-03-03 NOTE — ED Notes (Signed)
Bladder scan 347 ML

## 2015-03-03 NOTE — ED Provider Notes (Signed)
[facility] Emergency Department Provider Note  ____________________________________________  Time seen: Approximately 3:45 AM  I have reviewed the triage vital signs and the nursing notes.   HISTORY  Chief Complaint Urinary Retention    HPI William Radi. is a 82 y.o. male who presents for evaluation of inability to urinate. He reports that he had his urinary catheter removed after having lithotripsy about 24 hours ago, and since having the catheter removed he's not been able to be put a few drops. He reports he has pain and discomfort and a feeling as though he needs to urinate. But is unable. No fevers chills chest pain or trouble breathing. No nausea or vomiting. He otherwise feels well. He has good follow-up with urology Dr. Sherryl Barters.   Past Medical History  Diagnosis Date  . Essential hypertension   . Atrial fibrillation (HCC) 2016    paroxysmal atrial fibrillation, sick sinus syndrome  . Kidney stones   . Hyperlipemia   . Diabetes mellitus without complication (HCC)     type 2  . Dementia     alzheimers  . Arthritis     Patient Active Problem List   Diagnosis Date Noted  . Atrial fibrillation with RVR (HCC) 02/24/2015  . Cardiac conduction disorder 02/04/2015  . Arthritis 02/04/2015  . Atrial fibrillation (HCC) 02/04/2015  . Acid reflux 02/04/2015  . H/O vertigo 02/04/2015  . HLD (hyperlipidemia) 02/04/2015  . BP (high blood pressure) 02/04/2015  . Calculus of kidney 02/04/2015  . Sick sinus syndrome (HCC) 02/04/2015  . Fungal infection of nail 02/04/2015  . Type 2 diabetes mellitus (HCC) 02/04/2015  . Sepsis (HCC) 12/10/2014  . Altered bowel function 10/13/2014  . Chronic diarrhea 10/13/2014  . Dementia in Alzheimer's disease with late onset 09/20/2014  . Drug resistance 10/22/2013    Past Surgical History  Procedure Laterality Date  . Hernia repair Bilateral     inguinal  . Cystoscopy with stent placement Bilateral  12/10/2014    Procedure: CYSTOSCOPY WITH STENT PLACEMENT;  Surgeon: Hildred Laser, MD;  Location: [facility];  Service: Urology;  Laterality: Bilateral;  . Cystoscopy with retrograde pyelogram, ureteroscopy and stent placement Bilateral 12/10/2014    Procedure: CYSTOSCOPY WITH RETROGRADE PYELOGRAM, URETEROSCOPY AND STENT PLACEMENT;  Surgeon: Hildred Laser, MD;  Location: [facility];  Service: Urology;  Laterality: Bilateral;  . Extracorporeal shock wave lithotripsy Right 01/07/2015    Procedure: EXTRACORPOREAL SHOCK WAVE LITHOTRIPSY (ESWL);  Surgeon: Lorraine Lax, MD;  Location: [facility];  Service: Urology;  Laterality: Right;  . Extracorporeal shock wave lithotripsy Left 01/21/2015    Procedure: EXTRACORPOREAL SHOCK WAVE LITHOTRIPSY (ESWL);  Surgeon: Hildred Laser, MD;  Location: [facility];  Service: Urology;  Laterality: Left;  Marland Kitchen Eye surgery Bilateral     Cataract Extraction with IOL  . Ureteroscopy with holmium laser lithotripsy Bilateral 02/24/2015    Procedure: URETEROSCOPY WITH HOLMIUM LASER LITHOTRIPSY /STONE BASKETING;  Surgeon: Hildred Laser, MD;  Location: [facility];  Service: Urology;  Laterality: Bilateral;  . Cystoscopy w/ ureteral stent placement Bilateral 02/24/2015    Procedure: CYSTOSCOPY WITH STENT REPLACEMENT;  Surgeon: Hildred Laser, MD;  Location: [facility];  Service: Urology;  Laterality: Bilateral;    Current Outpatient Rx  Name  Route  Sig  Dispense  Refill  . apixaban (ELIQUIS) 5 MG TABS tablet   Oral   Take 5 mg by mouth 2 (two) times daily.         . cefUROXime (  CEFTIN) 250 MG tablet   Oral   Take 1 tablet (250 mg total) by mouth 2 (two) times daily with a meal.   14 tablet   0   . cephALEXin (KEFLEX) 250 MG capsule   Oral   Take 1 capsule (250 mg total) by mouth 4 (four) times daily.   40 capsule   0   . CIALIS 5 MG tablet      Reported on 03/02/2015      5     Dispense as written.   . hydrochlorothiazide (HYDRODIURIL) 25 MG  tablet   Oral   Take 1 tablet (25 mg total) by mouth daily as needed. Patient taking differently: Take 25 mg by mouth daily.    30 tablet   0   . HYDROcodone-acetaminophen (NORCO/VICODIN) 5-325 MG tablet   Oral   Take 1 tablet by mouth every 4 (four) hours as needed for moderate pain. Reported on 02/24/2015 Patient not taking: Reported on 03/02/2015   30 tablet   0   . loratadine (SM LORATADINE) 5 MG/5ML syrup   Oral   Take 5 mg by mouth daily as needed. Reported on 02/24/2015         . metoprolol succinate (TOPROL-XL) 25 MG 24 hr tablet   Oral   Take 25 mg by mouth daily.         . ondansetron (ZOFRAN) 4 MG tablet   Oral   Take 1 tablet (4 mg total) by mouth every 6 (six) hours as needed for nausea.   20 tablet   0   . pantoprazole (PROTONIX) 40 MG tablet   Oral   Take 40 mg by mouth daily.         . promethazine (PHENERGAN) 12.5 MG tablet   Oral   Take 12.5 mg by mouth every 6 (six) hours as needed for nausea or vomiting. Reported on 02/24/2015         . simvastatin (ZOCOR) 20 MG tablet   Oral   Take 20 mg by mouth daily.           Allergies Codeine; Diazepam; Ibuprofen; Penicillin g; and Sulfa antibiotics  Family History  Problem Relation Age of Onset  . Hypertension    . Kidney disease Neg Hx   . Prostate cancer Neg Hx     Social History Social History  Substance Use Topics  . Smoking status: Never Smoker   . Smokeless tobacco: Never Used  . Alcohol Use: No    Review of Systems Constitutional: No fever/chills Eyes: No visual changes. ENT: No sore throat. Cardiovascular: Denies chest pain. Respiratory: Denies shortness of breath. Gastrointestinal:No nausea, no vomiting.  No diarrhea.  No constipation. Genitourinary: Negative for dysuria. Musculoskeletal: Negative for back pain. Skin: Negative for rash. Neurological: Negative for headaches, focal weakness or numbness.  10-point ROS otherwise  negative.  ____________________________________________   PHYSICAL EXAM:  VITAL SIGNS: ED Triage Vitals  Enc Vitals Group     BP 03/03/15 0231 123/57 mmHg     Pulse Rate 03/03/15 0231 99     Resp 03/03/15 0231 22     Temp 03/03/15 0231 98.1 F (36.7 C)     Temp Source 03/03/15 0231 Oral     SpO2 03/03/15 0231 98 %     Weight 03/03/15 0231 151 lb (68.493 kg)     Height 03/03/15 0231 5\' 6"  (1.676 m)     Head Cir --      Peak Flow --  Pain Score 03/03/15 0231 10     Pain Loc --      Pain Edu? --      Excl. in GC? --    Constitutional: Alert and oriented. Well appearing and in no acute distress. Eyes: Conjunctivae are normal. PERRL. EOMI. Head: Atraumatic. Nose: No congestion/rhinnorhea. Mouth/Throat: Mucous membranes are moist.  Oropharynx non-erythematous. Neck: No stridor.   Cardiovascular: Normal rate, regular rhythm. Grossly normal heart sounds.  Good peripheral circulation. Respiratory: Normal respiratory effort.  No retractions. Lungs CTAB. Gastrointestinal: Soft and nontender. No distention. No abdominal bruits. No CVA tenderness. Urinary bladder seems slightly distended. No groin pain or masses. Penis atraumatic. Musculoskeletal: No lower extremity tenderness nor edema.  No joint effusions. Neurologic:  Normal speech and language. No gross focal neurologic deficits are appreciated. Skin:  Skin is warm, dry and intact. No rash noted. Psychiatric: Mood and affect are normal. Speech and behavior are normal.  ____________________________________________   LABS (all labs ordered are listed, but only abnormal results are displayed)  Labs Reviewed  BASIC METABOLIC PANEL - Abnormal; Notable for the following:    Glucose, Bld 169 (*)    BUN 23 (*)    Creatinine, Ser 1.50 (*)    GFR calc non Af Amer 42 (*)    GFR calc Af Amer 49 (*)    All other components within normal limits  URINALYSIS COMPLETEWITH MICROSCOPIC ([facility] ONLY) - Abnormal; Notable for the following:     Color, Urine RED (*)    APPearance CLOUDY (*)    Glucose, UA 50 (*)    Hgb urine dipstick 3+ (*)    Protein, ur 100 (*)    Bacteria, UA FEW (*)    All other components within normal limits  URINE CULTURE   ____________________________________________  EKG   ____________________________________________  RADIOLOGY   ____________________________________________   PROCEDURES  Procedure(s) performed: None  Critical Care performed: No  ____________________________________________   INITIAL IMPRESSION / ASSESSMENT AND PLAN / ED COURSE  Pertinent labs & imaging results that were available during my care of the patient were reviewed by me and considered in my medical decision making (see chart for details).  No infectious symptoms. Labs reassuring including baseline creatinine. Based on his symptoms appears he is having acute urinary retention and we will place his Foley back in. Bladder scan was about 300 post attempted [facility] for which she was really unable to do anything other than a few drops. Case and care is discussed with Dr.  Patsi Sears of [facility] and does advise based on urinalysis to place the patient on cephalosporin, he will contact Dr. Sherryl Barters and they'll arrange follow-up for the patient and asked one of 2 days.    no evidence of acute infection other than a few bacteria seen on the urinalysis for which culture was ordered. No evidence of complication, acute renal failure, or evidence of peritonitis by exam. Discharged patient to home with Foley catheter placed, currently draining well. He and family are familiar with its use as he just had one.   Return precautions and treatment recommendations and follow-up discussed with the patient who is agreeable with the plan. Patient reports that he feels normal now, and has no concerns at the time of discharge. He does list a penicillin allergy, but in discussion with him he states that he is not allergic to other  medicines like amoxicillin in the allergy was a very slight rash with no history of anaphylaxis. I do not believe the patient  is at high risk for cephalosporin cross-reactivity.  ____________________________________________   FINAL CLINICAL IMPRESSION(S) / ED DIAGNOSES  Final diagnoses:  Acute urinary retention      Sharyn CreamerMark Almas Rake, MD 03/03/15 [phone]

## 2015-03-03 NOTE — ED Notes (Signed)
Pt to triage via w/c, appears uncomfortable; [facility] but unable to urinate for last several times; c/o pain & urge to go

## 2015-03-04 LAB — STONE ANALYSIS
CA OXALATE, MONOHYDR.: 95 %
CA PHOS CRY STONE QL IR: 5 %
STONE WEIGHT KSTONE: 328 mg

## 2015-03-04 LAB — URINE CULTURE
Culture: NO GROWTH
Special Requests: NORMAL

## 2015-03-11 ENCOUNTER — Ambulatory Visit (INDEPENDENT_AMBULATORY_CARE_PROVIDER_SITE_OTHER): Payer: Medicare Other | Admitting: Urology

## 2015-03-11 VITALS — BP 128/80 | HR 77 | Ht 68.0 in | Wt 149.8 lb

## 2015-03-11 DIAGNOSIS — R339 Retention of urine, unspecified: Secondary | ICD-10-CM | POA: Diagnosis not present

## 2015-03-11 DIAGNOSIS — N200 Calculus of kidney: Secondary | ICD-10-CM | POA: Diagnosis not present

## 2015-03-11 LAB — MICROSCOPIC EXAMINATION
RBC, UA: >30 /hpf — ABNORMAL HIGH (ref 0–?)
WBC UA: NONE SEEN /HPF (ref 0–?)

## 2015-03-11 LAB — URINALYSIS, COMPLETE
BILIRUBIN UA: NEGATIVE
GLUCOSE, UA: NEGATIVE
Leukocytes, UA: NEGATIVE
Nitrite, UA: NEGATIVE
PH UA: 5 (ref 5.0–7.5)
Specific Gravity, UA: 1.015 (ref 1.005–1.030)
UUROB: 1 mg/dL (ref 0.2–1.0)

## 2015-03-11 LAB — BLADDER SCAN AMB NON-IMAGING: SCAN RESULT: 0

## 2015-03-11 MED ORDER — SILODOSIN 8 MG PO CAPS: 8.0000 mg | ORAL_CAPSULE | Freq: Every day | ORAL | Status: DC

## 2015-03-11 MED ORDER — LIDOCAINE HCL 2 % EX GEL
1.0000 "application " | Freq: Once | CUTANEOUS | Status: AC
  Administered 2015-03-11: 1 via URETHRAL

## 2015-03-11 MED ORDER — CIPROFLOXACIN HCL 500 MG PO TABS
500.0000 mg | ORAL_TABLET | Freq: Once | ORAL | Status: AC
  Administered 2015-03-11: 500 mg via ORAL

## 2015-03-11 NOTE — Addendum Note (Signed)
Addended by: Martha Clan on: 03/11/2015 03:43 PM   Modules accepted: Orders

## 2015-03-11 NOTE — Progress Notes (Signed)
03/11/2015 11:34 AM   William Pigeon Sr. Apr 20, 1932 [id number]  Referring provider: Marguarite Arbour, MD [address] [facility] [place], [place] [address]  Chief Complaint  Patient presents with  . Cysto Stent Removal    HPI: The patient is an 82 year old male who follows up after having emergent stent placement for bilateral obstructing ureteral stones and urosepsis. He has done better since the surgery. Though he complains of frequency and dysuria. Urine and blood cultures came back negative, however during the time of surgery, he was seen to have purulent discharge from his left ureteral orifice. The patient is doing much better now. He is adamant that he does not want to have a repeat cystoscopy at this time as he did not like how it felt after his last surgery.  Interval history: The patient underwent bilateral ESWL in a stage fashion and returns today for follow-up. He has done well since his surgery and has no complaints this time. KUB today does show residual stone in both ureters bilaterally with the stents in place.  January 2017 interval history: The patient returns after having bilateral ureteroscopy and stone removal. He developed A. Fib with RVR postoperatively and was kept in the hospital overnight. He also developed urinary retention and failed a trial of void on follow-up visit. Other than his urinary retention he has had no issues postoperatively.   PMH: Past Medical History  Diagnosis Date  . Essential hypertension   . Atrial fibrillation (HCC) 2016    paroxysmal atrial fibrillation, sick sinus syndrome  . Kidney stones   . Hyperlipemia   . Diabetes mellitus without complication (HCC)     type 2  . Dementia     alzheimers  . Arthritis     Surgical History: Past Surgical History  Procedure Laterality Date  . Hernia repair Bilateral     inguinal  . Cystoscopy with stent placement Bilateral 12/10/2014    Procedure: CYSTOSCOPY WITH STENT  PLACEMENT;  Surgeon: Hildred Laser, MD;  Location: [facility];  Service: Urology;  Laterality: Bilateral;  . Cystoscopy with retrograde pyelogram, ureteroscopy and stent placement Bilateral 12/10/2014    Procedure: CYSTOSCOPY WITH RETROGRADE PYELOGRAM, URETEROSCOPY AND STENT PLACEMENT;  Surgeon: Hildred Laser, MD;  Location: [facility];  Service: Urology;  Laterality: Bilateral;  . Extracorporeal shock wave lithotripsy Right 01/07/2015    Procedure: EXTRACORPOREAL SHOCK WAVE LITHOTRIPSY (ESWL);  Surgeon: Lorraine Lax, MD;  Location: [facility];  Service: Urology;  Laterality: Right;  . Extracorporeal shock wave lithotripsy Left 01/21/2015    Procedure: EXTRACORPOREAL SHOCK WAVE LITHOTRIPSY (ESWL);  Surgeon: Hildred Laser, MD;  Location: [facility];  Service: Urology;  Laterality: Left;  Mar[facility] Eye surgery Bilateral     Cataract Extraction with IOL  . Ureteroscopy with holmium laser lithotripsy Bilateral 02/24/2015    Procedure: URETEROSCOPY WITH HOLMIUM LASER LITHOTRIPSY /STONE BASKETING;  Surgeon: Hildred Laser, MD;  Location: [facility];  Service: Urology;  Laterality: Bilateral;  . Cystoscopy w/ ureteral stent placement Bilateral 02/24/2015    Procedure: CYSTOSCOPY WITH STENT REPLACEMENT;  Surgeon: Hildred Laser, MD;  Location: [facility];  Service: Urology;  Laterality: Bilateral;    Home Medications:    Medication List       This list is accurate as of: 03/11/15 11:34 AM.  Always use your most recent med list.               apixaban 5 MG Tabs tablet  Commonly known  asEverlene Balls  Take 5 mg by mouth 2 (two) times daily.     benzonatate 100 MG capsule  Commonly known as:  TESSALON  Take by mouth 3 (three) times daily as needed for cough.     cephALEXin 250 MG capsule  Commonly known as:  KEFLEX  Take 1 capsule (250 mg total) by mouth 4 (four) times daily.     hydrochlorothiazide 25 MG tablet  Commonly known as:  HYDRODIURIL  Take 1 tablet (25 mg total) by mouth daily as  needed.     metoprolol succinate 25 MG 24 hr tablet  Commonly known as:  TOPROL-XL  Take 25 mg by mouth daily.     pantoprazole 40 MG tablet  Commonly known as:  PROTONIX  Take 40 mg by mouth daily.     silodosin 8 MG Caps capsule  Commonly known as:  RAPAFLO  Take 1 capsule (8 mg total) by mouth daily with breakfast.     simvastatin 20 MG tablet  Commonly known as:  ZOCOR  Take 20 mg by mouth daily.        Allergies:  Allergies  Allergen Reactions  . Codeine Other (See Comments)    Causes side effects  . Diazepam Other (See Comments)  . Ibuprofen Other (See Comments)  . Penicillin G Rash  . Sulfa Antibiotics Rash    Family History: Family History  Problem Relation Age of Onset  . Hypertension    . Kidney disease Neg Hx   . Prostate cancer Neg Hx     Social History:  reports that he has never smoked. He has never used smokeless tobacco. He reports that he does not drink alcohol or use illicit drugs.  ROS:                                        Physical Exam: BP 128/80 mmHg  Pulse 77  Ht  (1.727 m)  Wt 149 lb 12.8 oz (67.949 kg)  BMI 22.78 kg/m2  Constitutional:  Alert and oriented, No acute distress. HEENT: [place] AT, moist mucus membranes.  Trachea midline, no masses. Cardiovascular: No clubbing, cyanosis, or edema. Respiratory: Normal respiratory effort, no increased work of breathing. GI: Abdomen is soft, nontender, nondistended, no abdominal masses GU: No CVA tenderness.  Skin: No rashes, bruises or suspicious lesions. Lymph: No cervical or inguinal adenopathy. Neurologic: Grossly intact, no focal deficits, moving all 4 extremities. Psychiatric: Normal mood and affect.  Laboratory Data: Lab Results  Component Value Date   WBC 17.1* 02/24/2015   HGB 14.8 02/24/2015   HCT 45.2 02/24/2015   MCV 91.0 02/24/2015   PLT 260 02/24/2015    Lab Results  Component Value Date   CREATININE 1.50* 03/03/2015    No results  found for: PSA  No results found for: TESTOSTERONE  Lab Results  Component Value Date   HGBA1C 6.7* 02/24/2015    Urinalysis    Component Value Date/Time   COLORURINE RED* 03/03/2015 0253   APPEARANCEUR CLOUDY* 03/03/2015 0253   LABSPEC 1.016 03/03/2015 0253   PHURINE 6.0 03/03/2015 0253   GLUCOSEU 50* 03/03/2015 0253   HGBUR 3+* 03/03/2015 0253   BILIRUBINUR NEGATIVE 03/03/2015 0253   BILIRUBINUR Negative 12/25/2014 1153   KETONESUR NEGATIVE 03/03/2015 0253   PROTEINUR 100* 03/03/2015 0253   NITRITE NEGATIVE 03/03/2015 0253   NITRITE Negative 12/25/2014 1153   LEUKOCYTESUR NEGATIVE  03/03/2015 0253   LEUKOCYTESUR Trace* 12/25/2014 1153     Cystoscopy Procedure Note  Patient identification was confirmed, informed consent was obtained, and patient was prepped using Betadine solution.  Lidocaine jelly was administered per urethral meatus.    Preoperative abx where received prior to procedure.     Pre-Procedure: - Inspection reveals a normal caliber ureteral meatus.  Procedure: The flexible cystoscope was introduced without difficulty - No urethral strictures/lesions are present. - Enlarged prostate  - Normal bladder neck - Bilateral ureteral orifices identified - Bladder mucosa  reveals no ulcers, tumors, or lesions - No bladder stones - No trabeculation  Bilateral ureteral stents removed in tact   Post-Procedure: - Patient tolerated the procedure well  PVR:   Assessment & Plan:    1. Bilateral ureteral stone status post ureteroscopy with stent removal today -follow-up renal ultrasound in 1 Month to rule out Iatrogenic hydronephrosis  2. BPH with history of postoperative urinary retention -we'll start the patient on Rapaflo 8 mg daily. He is warned of the risk of orthostatic hypotension.   Return in about 4 weeks (around 04/08/2015) for with renal ultrasound prior.  Hildred Laser, MD  [facility] [address] [place], [place] [address] [phone]

## 2015-03-11 NOTE — Progress Notes (Signed)
Catheter Removal  Patient is present today for a cysto stent removal catheter removal.  9 ml of water was drained from the balloon. A 16 FR foley cath was removed from the bladder no complications were noted . Patient tolerated well.  Preformed by: Burt Knack

## 2015-03-31 ENCOUNTER — Ambulatory Visit
Admission: RE | Admit: 2015-03-31 | Discharge: 2015-03-31 | Disposition: A | Discharge status: Home / Self Care | Payer: Medicare Other | Source: Ambulatory Visit | Attending: Urology | Admitting: Urology

## 2015-03-31 DIAGNOSIS — N281 Cyst of kidney, acquired: Secondary | ICD-10-CM | POA: Diagnosis not present

## 2015-03-31 DIAGNOSIS — N200 Calculus of kidney: Secondary | ICD-10-CM | POA: Diagnosis not present

## 2015-04-08 ENCOUNTER — Ambulatory Visit (INDEPENDENT_AMBULATORY_CARE_PROVIDER_SITE_OTHER): Payer: Medicare Other | Admitting: Urology

## 2015-04-08 ENCOUNTER — Encounter: Payer: Self-pay | Admitting: Urology

## 2015-04-08 VITALS — BP 122/72 | HR 88 | Ht 67.0 in | Wt 157.8 lb

## 2015-04-08 DIAGNOSIS — N400 Enlarged prostate without lower urinary tract symptoms: Secondary | ICD-10-CM | POA: Diagnosis not present

## 2015-04-08 DIAGNOSIS — N200 Calculus of kidney: Secondary | ICD-10-CM

## 2015-04-08 LAB — URINALYSIS, COMPLETE
BILIRUBIN UA: NEGATIVE
GLUCOSE, UA: NEGATIVE
KETONES UA: NEGATIVE
LEUKOCYTES UA: NEGATIVE
Nitrite, UA: NEGATIVE
SPEC GRAV UA: 1.025 (ref 1.005–1.030)
Urobilinogen, Ur: 0.2 mg/dL (ref 0.2–1.0)
pH, UA: 5 (ref 5.0–7.5)

## 2015-04-08 LAB — MICROSCOPIC EXAMINATION

## 2015-04-08 LAB — BLADDER SCAN AMB NON-IMAGING: Scan Result: 36

## 2015-04-08 NOTE — Progress Notes (Signed)
04/08/2015 9:31 AM   William Pigeon Sr. Nov 18, 1932 [id number]  Referring provider: Marguarite Arbour, MD [address] [facility] [place], [place] [address]  Chief Complaint  Patient presents with  . Nephrolithiasis    HPI: The patient is an 82 year old male who follows up after having emergent stent placement for bilateral obstructing ureteral stones and urosepsis. He has done better since the surgery. Though he complains of frequency and dysuria. Urine and blood cultures came back negative, however during the time of surgery, he was seen to have purulent discharge from his left ureteral orifice. The patient is doing much better now. He is adamant that he does not want to have a repeat cystoscopy at this time as he did not like how it felt after his last surgery.  Interval history: The patient underwent bilateral ESWL in a stage fashion and returns today for follow-up. He has done well since his surgery and has no complaints this time. KUB today does show residual stone in both ureters bilaterally with the stents in place.  January 2017 interval history: The patient returns after having bilateral ureteroscopy and stone removal. He developed A. Fib with RVR postoperatively and was kept in the hospital overnight. He also developed urinary retention and failed a trial of void on follow-up visit. Other than his urinary retention he has had no issues postoperatively.  February 2017 Interval History: He had a recent renal ultrasound that was negative for hydronephrosis. The patient says he feels much better since his stents removed. He has been on Rapaflo due to his history of postoperative urinary retention. His I PSS score today is 2/2 with nocturia 1 and frequency 1. He is overall very happy with his urinary quality of life, however does note some urgency with urge incontinence occasionally. He does not find this bothersome. He would like to stop his Rapaflo at this time as his  urinary symptoms have improved and he only had retention postoperatively.  PMH: Past Medical History  Diagnosis Date  . Essential hypertension   . Atrial fibrillation (HCC) 2016    paroxysmal atrial fibrillation, sick sinus syndrome  . Kidney stones   . Hyperlipemia   . Diabetes mellitus without complication (HCC)     type 2  . Dementia     alzheimers  . Arthritis     Surgical History: Past Surgical History  Procedure Laterality Date  . Hernia repair Bilateral     inguinal  . Cystoscopy with stent placement Bilateral 12/10/2014    Procedure: CYSTOSCOPY WITH STENT PLACEMENT;  Surgeon: Hildred Laser, MD;  Location: [facility];  Service: Urology;  Laterality: Bilateral;  . Cystoscopy with retrograde pyelogram, ureteroscopy and stent placement Bilateral 12/10/2014    Procedure: CYSTOSCOPY WITH RETROGRADE PYELOGRAM, URETEROSCOPY AND STENT PLACEMENT;  Surgeon: Hildred Laser, MD;  Location: [facility];  Service: Urology;  Laterality: Bilateral;  . Extracorporeal shock wave lithotripsy Right 01/07/2015    Procedure: EXTRACORPOREAL SHOCK WAVE LITHOTRIPSY (ESWL);  Surgeon: Lorraine Lax, MD;  Location: [facility];  Service: Urology;  Laterality: Right;  . Extracorporeal shock wave lithotripsy Left 01/21/2015    Procedure: EXTRACORPOREAL SHOCK WAVE LITHOTRIPSY (ESWL);  Surgeon: Hildred Laser, MD;  Location: [facility];  Service: Urology;  Laterality: Left;  Marland Kitchen Eye surgery Bilateral     Cataract Extraction with IOL  . Ureteroscopy with holmium laser lithotripsy Bilateral 02/24/2015    Procedure: URETEROSCOPY WITH HOLMIUM LASER LITHOTRIPSY /STONE BASKETING;  Surgeon: Hildred Laser, MD;  Location: [facility];  Service: Urology;  Laterality: Bilateral;  . Cystoscopy w/ ureteral stent placement Bilateral 02/24/2015    Procedure: CYSTOSCOPY WITH STENT REPLACEMENT;  Surgeon: Hildred Laser, MD;  Location: [facility];  Service: Urology;  Laterality: Bilateral;    Home Medications:      Medication List       This list is accurate as of: 04/08/15  9:31 AM.  Always use your most recent med list.               apixaban 5 MG Tabs tablet  Commonly known as:  ELIQUIS  Take 5 mg by mouth 2 (two) times daily.     benzonatate 100 MG capsule  Commonly known as:  TESSALON  Take by mouth 3 (three) times daily as needed for cough.     hydrochlorothiazide 25 MG tablet  Commonly known as:  HYDRODIURIL  Take 1 tablet (25 mg total) by mouth daily as needed.     metoprolol succinate 25 MG 24 hr tablet  Commonly known as:  TOPROL-XL  Take 25 mg by mouth daily.     pantoprazole 40 MG tablet  Commonly known as:  PROTONIX  Take 40 mg by mouth daily.     silodosin 8 MG Caps capsule  Commonly known as:  RAPAFLO  Take 1 capsule (8 mg total) by mouth daily with breakfast.     simvastatin 20 MG tablet  Commonly known as:  ZOCOR  Take 20 mg by mouth daily.        Allergies:  Allergies  Allergen Reactions  . Codeine Other (See Comments)    Causes side effects  . Diazepam Other (See Comments)  . Ibuprofen Other (See Comments)  . Penicillin G Rash  . Sulfa Antibiotics Rash    Family History: Family History  Problem Relation Age of Onset  . Hypertension    . Kidney disease Neg Hx   . Prostate cancer Neg Hx     Social History:  reports that he has never smoked. He has never used smokeless tobacco. He reports that he does not drink alcohol or use illicit drugs.  ROS: UROLOGY Frequent Urination?: Yes Hard to postpone urination?: Yes Burning/pain with urination?: No Get up at night to urinate?: Yes Leakage of urine?: Yes Urine stream starts and stops?: No Trouble starting stream?: No Do you have to strain to urinate?: No Blood in urine?: No Urinary tract infection?: No Sexually transmitted disease?: No Injury to kidneys or bladder?: No Painful intercourse?: No Weak stream?: Yes Erection problems?: No Penile pain?: No  Gastrointestinal Nausea?:  No Vomiting?: No Indigestion/heartburn?: No Diarrhea?: No Constipation?: No  Constitutional Fever: No Night sweats?: No Weight loss?: No Fatigue?: No  Skin Skin rash/lesions?: No Itching?: No  Eyes Blurred vision?: No Double vision?: No  Ears/Nose/Throat Sore throat?: No Sinus problems?: No  Hematologic/Lymphatic Swollen glands?: No Easy bruising?: No  Cardiovascular Leg swelling?: No Chest pain?: No  Respiratory Cough?: No Shortness of breath?: No  Endocrine Excessive thirst?: No  Musculoskeletal Back pain?: No Joint pain?: No  Neurological Headaches?: No Dizziness?: No  Psychologic Depression?: No Anxiety?: No  Physical Exam: BP 122/72 mmHg  Pulse 88  Ht 5\' 7"  (1.702 m)  Wt 157 lb 12.8 oz (71.578 kg)  BMI 24.71 kg/m2  Constitutional:  Alert and oriented, No acute distress. HEENT: [employer] AT, moist mucus membranes.  Trachea midline, no masses. Cardiovascular: No clubbing, cyanosis, or edema. Respiratory: Normal respiratory effort, no increased work of breathing.  GI: Abdomen is soft, nontender, nondistended, no abdominal masses GU: No CVA tenderness.  Skin: No rashes, bruises or suspicious lesions. Lymph: No cervical or inguinal adenopathy. Neurologic: Grossly intact, no focal deficits, moving all 4 extremities. Psychiatric: Normal mood and affect.  Laboratory Data: Lab Results  Component Value Date   WBC 17.1* 02/24/2015   HGB 14.8 02/24/2015   HCT 45.2 02/24/2015   MCV 91.0 02/24/2015   PLT 260 02/24/2015    Lab Results  Component Value Date   CREATININE 1.50* 03/03/2015    No results found for: PSA  No results found for: TESTOSTERONE  Lab Results  Component Value Date   HGBA1C 6.7* 02/24/2015    Urinalysis    Component Value Date/Time   COLORURINE RED* 03/03/2015 0253   APPEARANCEUR CLOUDY* 03/03/2015 0253   LABSPEC 1.016 03/03/2015 0253   PHURINE 6.0 03/03/2015 0253   GLUCOSEU Negative 03/11/2015 1007   HGBUR 3+*  03/03/2015 0253   BILIRUBINUR Negative 03/11/2015 1007   BILIRUBINUR NEGATIVE 03/03/2015 0253   KETONESUR NEGATIVE 03/03/2015 0253   PROTEINUR 100* 03/03/2015 0253   NITRITE Negative 03/11/2015 1007   NITRITE NEGATIVE 03/03/2015 0253   LEUKOCYTESUR Negative 03/11/2015 1007   LEUKOCYTESUR NEGATIVE 03/03/2015 0253    Pertinent Imaging:  Study Result     CLINICAL DATA: Nephrolithiasis.  EXAM: RENAL / URINARY TRACT ULTRASOUND COMPLETE  COMPARISON: Ultrasound of December 10, 2014.  FINDINGS: Right Kidney:  Length: 10.9 cm. 1.1 cm simple cyst is seen in midpole. Small nonobstructive calculi may be present in mid and lower pole. Echogenicity within normal limits. No mass or hydronephrosis visualized.  Left Kidney:  Length: 11.1 cm. 2.3 cm simple cyst is seen and lower pole. Probable small nonobstructive calculus seen in lower pole. Echogenicity within normal limits. No mass or hydronephrosis visualized.  Bladder:  Appears normal for degree of bladder distention. Bilateral ureteral jets are noted.  IMPRESSION: Bilateral simple renal cysts. Probable small bilateral nonobstructive renal calculi. No hydronephrosis is noted.     Assessment & Plan:    1. Bilateral ureteral stone status post ureteroscopy with stent removal -Negative post instrumentation renal ultrasound   2. BPH with history of postoperative urinary retention -The patient would like to stop his Rapaflo at this time. I think that this is reasonable since his urinary symptoms are minimal and his main problem was postoperative urinary retention with no history of retention outside of the postoperative period. He will stop his medication and will follow-up in 3 months to re-evaluate his urinary symptoms.  3. Bilateral nonobstructing renal calculi with largest 3 mm on the right -KUB in one year to monitor size  Return in about 3 months (around 07/06/2015).  Hildred Laser, MD  [facility] [address], [address] [place], [place] [address] [phone]

## 2015-04-30 ENCOUNTER — Encounter: Payer: Self-pay | Admitting: Urology

## 2015-07-08 ENCOUNTER — Ambulatory Visit: Payer: Medicare Other

## 2015-07-22 ENCOUNTER — Ambulatory Visit (INDEPENDENT_AMBULATORY_CARE_PROVIDER_SITE_OTHER): Payer: Medicare Other | Admitting: Urology

## 2015-07-22 ENCOUNTER — Encounter: Payer: Self-pay | Admitting: Urology

## 2015-07-22 VITALS — BP 115/69 | HR 73 | Ht 67.0 in | Wt 151.4 lb

## 2015-07-22 DIAGNOSIS — N200 Calculus of kidney: Secondary | ICD-10-CM

## 2015-07-22 DIAGNOSIS — I499 Cardiac arrhythmia, unspecified: Secondary | ICD-10-CM | POA: Insufficient documentation

## 2015-07-22 DIAGNOSIS — N400 Enlarged prostate without lower urinary tract symptoms: Secondary | ICD-10-CM

## 2015-07-22 LAB — URINALYSIS, COMPLETE
BILIRUBIN UA: NEGATIVE
Glucose, UA: NEGATIVE
Ketones, UA: NEGATIVE
Leukocytes, UA: NEGATIVE
Nitrite, UA: NEGATIVE
PH UA: 5 (ref 5.0–7.5)
Specific Gravity, UA: 1.025 (ref 1.005–1.030)
UUROB: 0.2 mg/dL (ref 0.2–1.0)

## 2015-07-22 LAB — MICROSCOPIC EXAMINATION
Bacteria, UA: NONE SEEN
EPITHELIAL CELLS (NON RENAL): NONE SEEN /HPF (ref 0–10)

## 2015-07-22 NOTE — Progress Notes (Signed)
07/22/2015 10:02 AM   William Pigeon Sr. 11/03/1932 [id number]  Referring provider: Marguarite Arbour, MD [address] [facility] [place], [place] [address]  Chief Complaint  Patient presents with  . Follow-up    renal stone    HPI: The patient is an 82 year old male With a past medical history of nephrolithiasis and BPH presents for follow-up.  1. BPH He has a history of urinary retention postoperatively following ureteroscopy and urinary 2017. He ultimately passed his trial of void well on Rapaflo. He took the medication for a month and stops it is felt that he Needed. He is now voiding at his baseline. His I PSS score is 5/2. He has nocturia 2 and a weak stream. He has no other complaints at this time. He is not interested in restarting Rapaflo as he feels he is voiding well..  2. History of nephrolithiasis The patient has a history of bilateral obstructing ureteral stones and urosepsis. He underwent definitive stone management for this in January 2017. He did have a nonobstructing bilateral calculi and is follow-up renal ultrasound. The largest was 3 mm on the right. The plan is been to obtain a KUB in 1 year to ensure that it does not increase in size.   PMH: Past Medical History  Diagnosis Date  . Essential hypertension   . Atrial fibrillation (HCC) 2016    paroxysmal atrial fibrillation, sick sinus syndrome  . Kidney stones   . Hyperlipemia   . Diabetes mellitus without complication (HCC)     type 2  . Dementia     alzheimers  . Arthritis     Surgical History: Past Surgical History  Procedure Laterality Date  . Hernia repair Bilateral     inguinal  . Cystoscopy with stent placement Bilateral 12/10/2014    Procedure: CYSTOSCOPY WITH STENT PLACEMENT;  Surgeon: Hildred Laser, MD;  Location: [facility];  Service: Urology;  Laterality: Bilateral;  . Cystoscopy with retrograde pyelogram, ureteroscopy and stent placement Bilateral 12/10/2014   Procedure: CYSTOSCOPY WITH RETROGRADE PYELOGRAM, URETEROSCOPY AND STENT PLACEMENT;  Surgeon: Hildred Laser, MD;  Location: [facility];  Service: Urology;  Laterality: Bilateral;  . Extracorporeal shock wave lithotripsy Right 01/07/2015    Procedure: EXTRACORPOREAL SHOCK WAVE LITHOTRIPSY (ESWL);  Surgeon: Lorraine Lax, MD;  Location: [facility];  Service: Urology;  Laterality: Right;  . Extracorporeal shock wave lithotripsy Left 01/21/2015    Procedure: EXTRACORPOREAL SHOCK WAVE LITHOTRIPSY (ESWL);  Surgeon: Hildred Laser, MD;  Location: [facility];  Service: Urology;  Laterality: Left;  Marland Kitchen Eye surgery Bilateral     Cataract Extraction with IOL  . Ureteroscopy with holmium laser lithotripsy Bilateral 02/24/2015    Procedure: URETEROSCOPY WITH HOLMIUM LASER LITHOTRIPSY /STONE BASKETING;  Surgeon: Hildred Laser, MD;  Location: [facility];  Service: Urology;  Laterality: Bilateral;  . Cystoscopy w/ ureteral stent placement Bilateral 02/24/2015    Procedure: CYSTOSCOPY WITH STENT REPLACEMENT;  Surgeon: Hildred Laser, MD;  Location: [facility];  Service: Urology;  Laterality: Bilateral;    Home Medications:    Medication List       This list is accurate as of: 07/22/15 10:02 AM.  Always use your most recent med list.               apixaban 5 MG Tabs tablet  Commonly known as:  ELIQUIS  Take 5 mg by mouth 2 (two) times daily.     hydrochlorothiazide 25 MG tablet  Commonly known  as:  HYDRODIURIL  Take 1 tablet (25 mg total) by mouth daily as needed.     metoprolol succinate 25 MG 24 hr tablet  Commonly known as:  TOPROL-XL  Take 25 mg by mouth daily.     pantoprazole 40 MG tablet  Commonly known as:  PROTONIX  Take 40 mg by mouth daily.     silodosin 8 MG Caps capsule  Commonly known as:  RAPAFLO  Take 1 capsule (8 mg total) by mouth daily with breakfast.     simvastatin 20 MG tablet  Commonly known as:  ZOCOR  Take 20 mg by mouth daily.        Allergies:  Allergies    Allergen Reactions  . Codeine Other (See Comments)    Causes side effects  . Diazepam Other (See Comments)  . Ibuprofen Other (See Comments)  . Penicillin G Rash  . Sulfa Antibiotics Rash    Family History: Family History  Problem Relation Age of Onset  . Hypertension    . Kidney disease Neg Hx   . Prostate cancer Neg Hx     Social History:  reports that he has never smoked. He has never used smokeless tobacco. He reports that he does not drink alcohol or use illicit drugs.  ROS: UROLOGY Frequent Urination?: No Hard to postpone urination?: No Burning/pain with urination?: No Get up at night to urinate?: No Leakage of urine?: Yes Urine stream starts and stops?: No Trouble starting stream?: No Do you have to strain to urinate?: No Blood in urine?: No Urinary tract infection?: No Sexually transmitted disease?: No Injury to kidneys or bladder?: No Painful intercourse?: No Weak stream?: No Erection problems?: No Penile pain?: No  Gastrointestinal Nausea?: No Vomiting?: No Indigestion/heartburn?: No Diarrhea?: No Constipation?: No  Constitutional Fever: No Night sweats?: No Weight loss?: No Fatigue?: No  Skin Skin rash/lesions?: No Itching?: No  Eyes Blurred vision?: No Double vision?: No  Ears/Nose/Throat Sore throat?: No Sinus problems?: No  Hematologic/Lymphatic Swollen glands?: No Easy bruising?: No  Cardiovascular Leg swelling?: No Chest pain?: No  Respiratory Cough?: No Shortness of breath?: No  Endocrine Excessive thirst?: No  Musculoskeletal Back pain?: No Joint pain?: No  Neurological Headaches?: No Dizziness?: No  Psychologic Depression?: No Anxiety?: No  Physical Exam: BP 115/69 mmHg  Pulse 73  Ht 5\' 7"  (1.702 m)  Wt 151 lb 6.4 oz (68.675 kg)  BMI 23.71 kg/m2  Constitutional:  Alert and oriented, No acute distress. HEENT: [place] AT, moist mucus membranes.  Trachea midline, no masses. Cardiovascular: No clubbing,  cyanosis, or edema. Respiratory: Normal respiratory effort, no increased work of breathing. GI: Abdomen is soft, nontender, nondistended, no abdominal masses GU: No CVA tenderness.  Skin: No rashes, bruises or suspicious lesions. Lymph: No cervical or inguinal adenopathy. Neurologic: Grossly intact, no focal deficits, moving all 4 extremities. Psychiatric: Normal mood and affect.  Laboratory Data: Lab Results  Component Value Date   WBC 17.1* 02/24/2015   HGB 14.8 02/24/2015   HCT 45.2 02/24/2015   MCV 91.0 02/24/2015   PLT 260 02/24/2015    Lab Results  Component Value Date   CREATININE 1.50* 03/03/2015    No results found for: PSA  No results found for: TESTOSTERONE  Lab Results  Component Value Date   HGBA1C 6.7* 02/24/2015    Urinalysis    Component Value Date/Time   COLORURINE RED* 03/03/2015 0253   APPEARANCEUR Clear 04/08/2015 0916   APPEARANCEUR CLOUDY* 03/03/2015 0253   LABSPEC 1.016  03/03/2015 0253   PHURINE 6.0 03/03/2015 0253   GLUCOSEU Negative 04/08/2015 0916   HGBUR 3+* 03/03/2015 0253   BILIRUBINUR Negative 04/08/2015 0916   BILIRUBINUR NEGATIVE 03/03/2015 0253   KETONESUR NEGATIVE 03/03/2015 0253   PROTEINUR 1+* 04/08/2015 0916   PROTEINUR 100* 03/03/2015 0253   NITRITE Negative 04/08/2015 0916   NITRITE NEGATIVE 03/03/2015 0253   LEUKOCYTESUR Negative 04/08/2015 0916   LEUKOCYTESUR NEGATIVE 03/03/2015 0253     Assessment & Plan:    1. BPH with history of postoperative urinary retention -Resolved. Patient is voiding well without medication this time. No treatment necessary as the patient is asymptomatic at this time.  2. Bilateral nonobstructing renal calculi with largest 3 mm on the right -KUB in one year to monitor size  Return in about 1 year (around 07/21/2016) for with KUB prior.  Hildred Laser, MD  [facility] [address], [address] [place], [place] [address] [phone]

## 2016-03-27 ENCOUNTER — Encounter
Admission: EM | Disposition: A | Discharge status: Home / Self Care | Payer: Self-pay | Source: Home / Self Care | Attending: Internal Medicine

## 2016-03-27 ENCOUNTER — Emergency Department: Payer: Medicare Other

## 2016-03-27 ENCOUNTER — Encounter: Payer: Self-pay | Admitting: Emergency Medicine

## 2016-03-27 ENCOUNTER — Emergency Department: Payer: Medicare Other | Admitting: Anesthesiology

## 2016-03-27 ENCOUNTER — Inpatient Hospital Stay
Admission: EM | Admit: 2016-03-27 | Discharge: 2016-03-28 | DRG: 694 | Disposition: A | Discharge status: Home / Self Care | Payer: Medicare Other | Attending: Internal Medicine | Admitting: Internal Medicine

## 2016-03-27 DIAGNOSIS — N210 Calculus in bladder: Secondary | ICD-10-CM | POA: Diagnosis present

## 2016-03-27 DIAGNOSIS — M1990 Unspecified osteoarthritis, unspecified site: Secondary | ICD-10-CM | POA: Diagnosis present

## 2016-03-27 DIAGNOSIS — R112 Nausea with vomiting, unspecified: Secondary | ICD-10-CM

## 2016-03-27 DIAGNOSIS — R8281 Pyuria: Secondary | ICD-10-CM

## 2016-03-27 DIAGNOSIS — I482 Chronic atrial fibrillation: Secondary | ICD-10-CM | POA: Diagnosis present

## 2016-03-27 DIAGNOSIS — Z79899 Other long term (current) drug therapy: Secondary | ICD-10-CM

## 2016-03-27 DIAGNOSIS — G301 Alzheimer's disease with late onset: Secondary | ICD-10-CM | POA: Diagnosis present

## 2016-03-27 DIAGNOSIS — F0280 Dementia in other diseases classified elsewhere without behavioral disturbance: Secondary | ICD-10-CM | POA: Diagnosis present

## 2016-03-27 DIAGNOSIS — Z886 Allergy status to analgesic agent status: Secondary | ICD-10-CM

## 2016-03-27 DIAGNOSIS — N179 Acute kidney failure, unspecified: Secondary | ICD-10-CM

## 2016-03-27 DIAGNOSIS — Z7901 Long term (current) use of anticoagulants: Secondary | ICD-10-CM

## 2016-03-27 DIAGNOSIS — Z882 Allergy status to sulfonamides status: Secondary | ICD-10-CM | POA: Diagnosis not present

## 2016-03-27 DIAGNOSIS — I495 Sick sinus syndrome: Secondary | ICD-10-CM | POA: Diagnosis present

## 2016-03-27 DIAGNOSIS — Z880 Allergy status to penicillin: Secondary | ICD-10-CM

## 2016-03-27 DIAGNOSIS — Z885 Allergy status to narcotic agent status: Secondary | ICD-10-CM | POA: Diagnosis not present

## 2016-03-27 DIAGNOSIS — E1122 Type 2 diabetes mellitus with diabetic chronic kidney disease: Secondary | ICD-10-CM | POA: Diagnosis present

## 2016-03-27 DIAGNOSIS — N1330 Unspecified hydronephrosis: Secondary | ICD-10-CM | POA: Diagnosis present

## 2016-03-27 DIAGNOSIS — N132 Hydronephrosis with renal and ureteral calculous obstruction: Principal | ICD-10-CM | POA: Diagnosis present

## 2016-03-27 DIAGNOSIS — Z888 Allergy status to other drugs, medicaments and biological substances status: Secondary | ICD-10-CM | POA: Diagnosis not present

## 2016-03-27 DIAGNOSIS — Z87442 Personal history of urinary calculi: Secondary | ICD-10-CM | POA: Diagnosis not present

## 2016-03-27 DIAGNOSIS — N200 Calculus of kidney: Secondary | ICD-10-CM | POA: Diagnosis not present

## 2016-03-27 DIAGNOSIS — N183 Chronic kidney disease, stage 3 (moderate): Secondary | ICD-10-CM | POA: Diagnosis present

## 2016-03-27 DIAGNOSIS — N209 Urinary calculus, unspecified: Secondary | ICD-10-CM

## 2016-03-27 DIAGNOSIS — K219 Gastro-esophageal reflux disease without esophagitis: Secondary | ICD-10-CM | POA: Diagnosis present

## 2016-03-27 DIAGNOSIS — Z8249 Family history of ischemic heart disease and other diseases of the circulatory system: Secondary | ICD-10-CM | POA: Diagnosis not present

## 2016-03-27 DIAGNOSIS — I480 Paroxysmal atrial fibrillation: Secondary | ICD-10-CM | POA: Diagnosis present

## 2016-03-27 DIAGNOSIS — N390 Urinary tract infection, site not specified: Secondary | ICD-10-CM | POA: Diagnosis present

## 2016-03-27 DIAGNOSIS — N323 Diverticulum of bladder: Secondary | ICD-10-CM | POA: Diagnosis present

## 2016-03-27 DIAGNOSIS — I129 Hypertensive chronic kidney disease with stage 1 through stage 4 chronic kidney disease, or unspecified chronic kidney disease: Secondary | ICD-10-CM | POA: Diagnosis present

## 2016-03-27 DIAGNOSIS — E785 Hyperlipidemia, unspecified: Secondary | ICD-10-CM | POA: Diagnosis present

## 2016-03-27 DIAGNOSIS — R1032 Left lower quadrant pain: Secondary | ICD-10-CM

## 2016-03-27 DIAGNOSIS — N201 Calculus of ureter: Secondary | ICD-10-CM

## 2016-03-27 HISTORY — PX: CYSTOSCOPY W/ RETROGRADES: SHX1426

## 2016-03-27 HISTORY — PX: CYSTOSCOPY WITH STENT PLACEMENT: SHX5790

## 2016-03-27 LAB — CBC WITH DIFFERENTIAL/PLATELET
Basophils Absolute: 0.1 10*3/uL (ref 0–0.1)
Basophils Relative: 0 %
Eosinophils Absolute: 0.1 10*3/uL (ref 0–0.7)
Eosinophils Relative: 0 %
HCT: 53.4 % — ABNORMAL HIGH (ref 40.0–52.0)
Hemoglobin: 18.4 g/dL — ABNORMAL HIGH (ref 13.0–18.0)
LYMPHS ABS: 0.9 10*3/uL — AB (ref 1.0–3.6)
LYMPHS PCT: 4 %
MCH: 31.1 pg (ref 26.0–34.0)
MCHC: 34.4 g/dL (ref 32.0–36.0)
MCV: 90.6 fL (ref 80.0–100.0)
Monocytes Absolute: 1.8 10*3/uL — ABNORMAL HIGH (ref 0.2–1.0)
Monocytes Relative: 9 %
NEUTROS ABS: 18.1 10*3/uL — AB (ref 1.4–6.5)
NEUTROS PCT: 87 %
Platelets: 289 10*3/uL (ref 150–440)
RBC: 5.89 MIL/uL (ref 4.40–5.90)
RDW: 14.4 % (ref 11.5–14.5)
WBC: 20.9 10*3/uL — AB (ref 3.8–10.6)

## 2016-03-27 LAB — COMPREHENSIVE METABOLIC PANEL
ALT: 14 U/L — ABNORMAL LOW (ref 17–63)
AST: 31 U/L (ref 15–41)
Albumin: 4.7 g/dL (ref 3.5–5.0)
Alkaline Phosphatase: 73 U/L (ref 38–126)
Anion gap: 9 (ref 5–15)
BUN: 29 mg/dL — ABNORMAL HIGH (ref 6–20)
CO2: 28 mmol/L (ref 22–32)
Calcium: 10 mg/dL (ref 8.9–10.3)
Chloride: 99 mmol/L — ABNORMAL LOW (ref 101–111)
Creatinine, Ser: 1.91 mg/dL — ABNORMAL HIGH (ref 0.61–1.24)
GFR calc Af Amer: 36 mL/min — ABNORMAL LOW (ref >60)
GFR calc non Af Amer: 31 mL/min — ABNORMAL LOW (ref >60)
Glucose, Bld: 156 mg/dL — ABNORMAL HIGH (ref 65–99)
Potassium: 4 mmol/L (ref 3.5–5.1)
Sodium: 136 mmol/L (ref 135–145)
Total Bilirubin: 1.9 mg/dL — ABNORMAL HIGH (ref 0.3–1.2)
Total Protein: 7.9 g/dL (ref 6.5–8.1)

## 2016-03-27 LAB — URINALYSIS, COMPLETE (UACMP) WITH MICROSCOPIC
Bacteria, UA: NONE SEEN
Bilirubin Urine: NEGATIVE
Glucose, UA: NEGATIVE mg/dL
Ketones, ur: NEGATIVE mg/dL
Leukocytes, UA: NEGATIVE
Nitrite: NEGATIVE
Protein, ur: 30 mg/dL — AB
Specific Gravity, Urine: 1.023 (ref 1.005–1.030)
Squamous Epithelial / LPF: NONE SEEN
pH: 5 (ref 5.0–8.0)

## 2016-03-27 LAB — LIPASE, BLOOD: Lipase: 38 U/L (ref 11–51)

## 2016-03-27 SURGERY — CYSTOSCOPY, WITH RETROGRADE PYELOGRAM
Anesthesia: General | Site: Ureter | Laterality: Bilateral | Wound class: Clean Contaminated

## 2016-03-27 MED ORDER — DEXTROSE 5 % IV SOLN: 1.0000 g | Freq: Once | INTRAVENOUS | Status: DC

## 2016-03-27 MED ORDER — ACETAMINOPHEN 650 MG RE SUPP: 650.0000 mg | Freq: Four times a day (QID) | RECTAL | Status: DC | PRN

## 2016-03-27 MED ORDER — SUGAMMADEX SODIUM 200 MG/2ML IV SOLN
INTRAVENOUS | Status: AC
  Filled 2016-03-27: qty 2

## 2016-03-27 MED ORDER — ONDANSETRON HCL 4 MG PO TABS: 4.0000 mg | ORAL_TABLET | Freq: Four times a day (QID) | ORAL | Status: DC | PRN

## 2016-03-27 MED ORDER — ONDANSETRON HCL 4 MG/2ML IJ SOLN: 4.0000 mg | Freq: Four times a day (QID) | INTRAMUSCULAR | Status: DC | PRN

## 2016-03-27 MED ORDER — IOTHALAMATE MEGLUMINE 43 % IV SOLN
INTRAVENOUS | Status: DC | PRN
  Administered 2016-03-27: 15 mL

## 2016-03-27 MED ORDER — FENTANYL CITRATE (PF) 100 MCG/2ML IJ SOLN
INTRAMUSCULAR | Status: DC | PRN
  Administered 2016-03-27: 100 ug via INTRAVENOUS

## 2016-03-27 MED ORDER — IOPAMIDOL (ISOVUE-300) INJECTION 61%
75.0000 mL | Freq: Once | INTRAVENOUS | Status: AC | PRN
  Administered 2016-03-27: 75 mL via INTRAVENOUS
  Filled 2016-03-27: qty 75

## 2016-03-27 MED ORDER — FENTANYL CITRATE (PF) 100 MCG/2ML IJ SOLN: 25.0000 ug | INTRAMUSCULAR | Status: DC | PRN

## 2016-03-27 MED ORDER — ENOXAPARIN SODIUM 30 MG/0.3ML ~~LOC~~ SOLN: 30.0000 mg | SUBCUTANEOUS | Status: DC

## 2016-03-27 MED ORDER — METOPROLOL TARTRATE 5 MG/5ML IV SOLN: 5.0000 mg | INTRAVENOUS | Status: DC | PRN

## 2016-03-27 MED ORDER — LEVOFLOXACIN 250 MG PO TABS
250.0000 mg | ORAL_TABLET | Freq: Every day | ORAL | Status: DC
  Administered 2016-03-27: 250 mg via ORAL
  Filled 2016-03-27: qty 1

## 2016-03-27 MED ORDER — ROCURONIUM BROMIDE 100 MG/10ML IV SOLN
INTRAVENOUS | Status: DC | PRN
  Administered 2016-03-27: 10 mg via INTRAVENOUS

## 2016-03-27 MED ORDER — FENTANYL CITRATE (PF) 100 MCG/2ML IJ SOLN
INTRAMUSCULAR | Status: AC
  Filled 2016-03-27: qty 2

## 2016-03-27 MED ORDER — SODIUM CHLORIDE 0.9 % IV SOLN
INTRAVENOUS | Status: AC
  Administered 2016-03-27 – 2016-03-28 (×3): via INTRAVENOUS

## 2016-03-27 MED ORDER — SODIUM CHLORIDE 0.9 % IV BOLUS (SEPSIS)
1000.0000 mL | Freq: Once | INTRAVENOUS | Status: AC
  Administered 2016-03-27: 1000 mL via INTRAVENOUS

## 2016-03-27 MED ORDER — DEXAMETHASONE SODIUM PHOSPHATE 10 MG/ML IJ SOLN
INTRAMUSCULAR | Status: DC | PRN
  Administered 2016-03-27: 10 mg via INTRAVENOUS

## 2016-03-27 MED ORDER — PROPOFOL 10 MG/ML IV BOLUS
INTRAVENOUS | Status: AC
  Filled 2016-03-27: qty 20

## 2016-03-27 MED ORDER — ONDANSETRON HCL 4 MG/2ML IJ SOLN: 4.0000 mg | Freq: Once | INTRAMUSCULAR | Status: DC | PRN

## 2016-03-27 MED ORDER — ONDANSETRON HCL 4 MG/2ML IJ SOLN
INTRAMUSCULAR | Status: DC | PRN
  Administered 2016-03-27: 4 mg via INTRAVENOUS

## 2016-03-27 MED ORDER — MORPHINE SULFATE (PF) 2 MG/ML IV SOLN: 2.0000 mg | Freq: Four times a day (QID) | INTRAVENOUS | Status: DC | PRN

## 2016-03-27 MED ORDER — SUGAMMADEX SODIUM 200 MG/2ML IV SOLN: INTRAVENOUS | Status: DC | PRN

## 2016-03-27 MED ORDER — CEFTRIAXONE SODIUM-DEXTROSE 1-3.74 GM-% IV SOLR
1.0000 g | Freq: Once | INTRAVENOUS | Status: AC
  Administered 2016-03-27: 1 g via INTRAVENOUS
  Filled 2016-03-27: qty 50

## 2016-03-27 MED ORDER — LIDOCAINE HCL (CARDIAC) 20 MG/ML IV SOLN
INTRAVENOUS | Status: DC | PRN
  Administered 2016-03-27: 100 mg via INTRAVENOUS

## 2016-03-27 MED ORDER — SUCCINYLCHOLINE CHLORIDE 20 MG/ML IJ SOLN
INTRAMUSCULAR | Status: DC | PRN
  Administered 2016-03-27: 100 mg via INTRAVENOUS

## 2016-03-27 MED ORDER — ACETAMINOPHEN 325 MG PO TABS: 650.0000 mg | ORAL_TABLET | Freq: Four times a day (QID) | ORAL | Status: DC | PRN

## 2016-03-27 MED ORDER — TRAMADOL HCL 50 MG PO TABS
50.0000 mg | ORAL_TABLET | Freq: Four times a day (QID) | ORAL | Status: DC | PRN
  Administered 2016-03-28: 50 mg via ORAL
  Filled 2016-03-27: qty 1

## 2016-03-27 MED ORDER — SODIUM CHLORIDE 0.9 % IV SOLN
INTRAVENOUS | Status: DC
  Administered 2016-03-27 (×2): via INTRAVENOUS

## 2016-03-27 MED ORDER — SUGAMMADEX SODIUM 200 MG/2ML IV SOLN
INTRAVENOUS | Status: DC | PRN
  Administered 2016-03-27: 127 mg via INTRAVENOUS

## 2016-03-27 MED ORDER — PROPOFOL 10 MG/ML IV BOLUS
INTRAVENOUS | Status: DC | PRN
  Administered 2016-03-27: 150 mg via INTRAVENOUS

## 2016-03-27 MED ORDER — IOPAMIDOL (ISOVUE-300) INJECTION 61%
30.0000 mL | Freq: Once | INTRAVENOUS | Status: AC | PRN
  Administered 2016-03-27: 30 mL via ORAL
  Filled 2016-03-27: qty 30

## 2016-03-27 SURGICAL SUPPLY — 21 items
BAG DRAIN CYSTO-URO LG1000N (MISCELLANEOUS) ×2 IMPLANT
CATH FOL 2WAY LX 16X30 (CATHETERS) ×2 IMPLANT
CATH URETL 5X70 OPEN END (CATHETERS) ×2 IMPLANT
CONRAY 43 FOR UROLOGY 50M (MISCELLANEOUS) ×2 IMPLANT
DRAPE UTILITY 15X26 TOWEL STRL (DRAPES) ×2 IMPLANT
GLOVE BIO SURGEON STRL SZ 6.5 (GLOVE) ×2 IMPLANT
GOWN STRL REUS W/ TWL LRG LVL3 (GOWN DISPOSABLE) ×2 IMPLANT
GOWN STRL REUS W/TWL LRG LVL3 (GOWN DISPOSABLE) ×2
KIT RM TURNOVER CYSTO AR (KITS) ×2 IMPLANT
PACK CYSTO AR (MISCELLANEOUS) ×2 IMPLANT
SCRUB POVIDONE IODINE 4 OZ (MISCELLANEOUS) ×2 IMPLANT
SENSORWIRE 0.038 NOT ANGLED (WIRE) ×2
SET CYSTO W/LG BORE CLAMP LF (SET/KITS/TRAYS/PACK) ×2 IMPLANT
SOL .9 NS 3000ML IRR  AL (IV SOLUTION) ×1
SOL .9 NS 3000ML IRR UROMATIC (IV SOLUTION) ×1 IMPLANT
STENT URET 6FRX24 CONTOUR (STENTS) IMPLANT
STENT URET 6FRX26 CONTOUR (STENTS) ×4 IMPLANT
SURGILUBE 2OZ TUBE FLIPTOP (MISCELLANEOUS) ×2 IMPLANT
SYRINGE IRR TOOMEY STRL 70CC (SYRINGE) ×2 IMPLANT
WATER STERILE IRR 1000ML POUR (IV SOLUTION) ×2 IMPLANT
WIRE SENSOR 0.038 NOT ANGLED (WIRE) ×1 IMPLANT

## 2016-03-27 NOTE — ED Notes (Addendum)
abx sent to OR for on call, consent obtained, report given to same day

## 2016-03-27 NOTE — Anesthesia Procedure Notes (Signed)
Procedure Name: Intubation Date/Time: 03/27/2016 3:53 PM Performed by: Nelda Marseille Pre-anesthesia Checklist: Patient identified, Patient being monitored, Timeout performed, Emergency Drugs available and Suction available Patient Re-evaluated:Patient Re-evaluated prior to inductionOxygen Delivery Method: Circle system utilized Preoxygenation: Pre-oxygenation with 100% oxygen Intubation Type: IV induction Ventilation: Mask ventilation without difficulty Laryngoscope Size: Mac and 3 Grade View: Grade I Tube type: Oral Tube size: 7.0 mm Number of attempts: 1 Airway Equipment and Method: Stylet Placement Confirmation: ETT inserted through vocal cords under direct vision,  positive ETCO2 and breath sounds checked- equal and bilateral Secured at: 21 cm Tube secured with: Tape Dental Injury: Teeth and Oropharynx as per pre-operative assessment

## 2016-03-27 NOTE — H&P (Signed)
[facility] [place] [facility]   PATIENT NAME: William Lozano    MR#:  [id number]  DATE OF BIRTH:  30-Jun-1932  DATE OF ADMISSION:  03/27/2016  PRIMARY CARE PHYSICIAN: Marguarite Arbour, MD   REQUESTING/REFERRING PHYSICIAN: Myrna Blazer, MD  CHIEF COMPLAINT:  Left lower quadrant abdominal pain  HISTORY OF PRESENT ILLNESS:  William Lozano  is a 83 y.o. male with a known history of GERD, Alzheimer's dementia, paroxysmal atrial fib relation on eliquis is presented to the ED with a chief complaint of left lower lower quadrant abdominal pain for the past 3 days. Associated with nausea and vomiting. Patient had history of inguinal hernias completely. In the right inguinal region. CT abdomen has revealed left UVJ 8 mm stone. Urology is consulted Dr. Tiburcio Pea is going to take him to the OR as soon as possible for hydronephrosis. Patient is resting comfortably. He took his morning dose of eliquis. No other complaints. Girlfriend at bedside  PAST MEDICAL HISTORY:   Past Medical History:  Diagnosis Date  . Arthritis   . Atrial fibrillation (HCC) 2016   paroxysmal atrial fibrillation, sick sinus syndrome  . Dementia    alzheimers  . Essential hypertension   . Hyperlipemia   . Kidney stones     PAST SURGICAL HISTOIRY:   Past Surgical History:  Procedure Laterality Date  . CYSTOSCOPY W/ URETERAL STENT PLACEMENT Bilateral 02/24/2015   Procedure: CYSTOSCOPY WITH STENT REPLACEMENT;  Surgeon: Hildred Laser, MD;  Location: [facility];  Service: Urology;  Laterality: Bilateral;  . CYSTOSCOPY WITH RETROGRADE PYELOGRAM, URETEROSCOPY AND STENT PLACEMENT Bilateral 12/10/2014   Procedure: CYSTOSCOPY WITH RETROGRADE PYELOGRAM, URETEROSCOPY AND STENT PLACEMENT;  Surgeon: Hildred Laser, MD;  Location: [facility];  Service: Urology;  Laterality: Bilateral;  . CYSTOSCOPY WITH STENT PLACEMENT Bilateral 12/10/2014   Procedure: CYSTOSCOPY WITH STENT PLACEMENT;  Surgeon:  Hildred Laser, MD;  Location: [facility];  Service: Urology;  Laterality: Bilateral;  . EXTRACORPOREAL SHOCK WAVE LITHOTRIPSY Right 01/07/2015   Procedure: EXTRACORPOREAL SHOCK WAVE LITHOTRIPSY (ESWL);  Surgeon: Lorraine Lax, MD;  Location: [facility];  Service: Urology;  Laterality: Right;  . EXTRACORPOREAL SHOCK WAVE LITHOTRIPSY Left 01/21/2015   Procedure: EXTRACORPOREAL SHOCK WAVE LITHOTRIPSY (ESWL);  Surgeon: Hildred Laser, MD;  Location: [facility];  Service: Urology;  Laterality: Left;  . EYE SURGERY Bilateral    Cataract Extraction with IOL  . HERNIA REPAIR Bilateral    inguinal  . URETEROSCOPY WITH HOLMIUM LASER LITHOTRIPSY Bilateral 02/24/2015   Procedure: URETEROSCOPY WITH HOLMIUM LASER LITHOTRIPSY /STONE BASKETING;  Surgeon: Hildred Laser, MD;  Location: [facility];  Service: Urology;  Laterality: Bilateral;    SOCIAL HISTORY:   Social History  Substance Use Topics  . Smoking status: Never Smoker  . Smokeless tobacco: Never Used  . Alcohol use No    FAMILY HISTORY:   Family History  Problem Relation Age of Onset  . Hypertension    . Kidney disease Neg Hx   . Prostate cancer Neg Hx     DRUG ALLERGIES:   Allergies  Allergen Reactions  . Codeine Other (See Comments)    Causes side effects  . Diazepam Other (See Comments)  . Ibuprofen Other (See Comments)  . Penicillin G Rash  . Sulfa Antibiotics Rash    REVIEW OF SYSTEMS:  CONSTITUTIONAL: No fever, fatigue or weakness.  EYES: No blurred or double vision.  EARS, NOSE, AND THROAT: No tinnitus or ear pain.  RESPIRATORY: No cough,  shortness of breath, wheezing or hemoptysis.  CARDIOVASCULAR: No chest pain, orthopnea, edema.  GASTROINTESTINAL: No nausea, vomiting, diarrhea or Reporting left-sided lower quadrant abdominal pain.  GENITOURINARY: No dysuria, hematuria.  ENDOCRINE: No polyuria, nocturia,  HEMATOLOGY: No anemia, easy bruising or bleeding SKIN: No rash or lesion. MUSCULOSKELETAL: No joint pain  or arthritis.   NEUROLOGIC: No tingling, numbness, weakness.  PSYCHIATRY: No anxiety or depression.   MEDICATIONS AT HOME:   Prior to Admission medications   Medication Sig Start Date End Date Taking? Authorizing Provider  apixaban (ELIQUIS) 5 MG TABS tablet Take 5 mg by mouth 2 (two) times daily.   Yes Historical Provider, MD  hydrochlorothiazide (HYDRODIURIL) 25 MG tablet Take 1 tablet (25 mg total) by mouth daily as needed. Patient taking differently: Take 25 mg by mouth daily.  12/14/14  Yes Marguarite Arbour, MD  metoprolol succinate (TOPROL-XL) 25 MG 24 hr tablet Take 25 mg by mouth daily.   Yes Historical Provider, MD  pantoprazole (PROTONIX) 40 MG tablet Take 40 mg by mouth daily.   Yes Historical Provider, MD  rivastigmine (EXELON) 1.5 MG capsule Take 1.5 mg by mouth 2 (two) times daily.   Yes Historical Provider, MD  simvastatin (ZOCOR) 20 MG tablet Take 20 mg by mouth daily.   Yes Historical Provider, MD  silodosin (RAPAFLO) 8 MG CAPS capsule Take 1 capsule (8 mg total) by mouth daily with breakfast. Patient not taking: Reported on 07/22/2015 03/11/15   Hildred Laser, MD      VITAL SIGNS:  Blood pressure 135/79, pulse 75, temperature 97.9 F (36.6 C), temperature source Tympanic, resp. rate 18, height 5\' 7"  (1.702 m), weight 63.5 kg (140 lb), SpO2 100 %.  PHYSICAL EXAMINATION:  GENERAL:  83 y.o.-year-old patient lying in the bed with no acute distress.  EYES: Pupils equal, round, reactive to light and accommodation. No scleral icterus. Extraocular muscles intact.  HEENT: Head atraumatic, normocephalic. Oropharynx and nasopharynx clear.  NECK:  Supple, no jugular venous distention. No thyroid enlargement, no tenderness.  LUNGS: Normal breath sounds bilaterally, no wheezing, rales,rhonchi or crepitation. No use of accessory muscles of respiration.  CARDIOVASCULAR: S1, S2 normal. No murmurs, rubs, or gallops.  ABDOMEN: Left lower quadrant abdomen is tender but no rebound  tenderness Soft, , nondistended. Bowel sounds present. No organomegaly or mass.  EXTREMITIES: No pedal edema, cyanosis, or clubbing.  NEUROLOGIC: Cranial nerves II through XII are intact. Muscle strength 5/5 in all extremities. Sensation intact. Gait not checked.  PSYCHIATRIC: The patient is alert and oriented x 3.  SKIN: No obvious rash, lesion, or ulcer.   LABORATORY PANEL:   CBC  Recent Labs Lab 03/27/16 1100  WBC 20.9*  HGB 18.4*  HCT 53.4*  PLT 289   ------------------------------------------------------------------------------------------------------------------  Chemistries   Recent Labs Lab 03/27/16 1100  NA 136  K 4.0  CL 99*  CO2 28  GLUCOSE 156*  BUN 29*  CREATININE 1.91*  CALCIUM 10.0  AST 31  ALT 14*  ALKPHOS 73  BILITOT 1.9*   ------------------------------------------------------------------------------------------------------------------  Cardiac Enzymes No results for input(s): TROPONINI in the last 168 hours. ------------------------------------------------------------------------------------------------------------------  RADIOLOGY:  Ct Abdomen Pelvis W Contrast  Result Date: 03/27/2016 CLINICAL DATA:  Left lower quadrant abdominal pain with nausea, vomiting, and fever. EXAM: CT ABDOMEN AND PELVIS WITH CONTRAST TECHNIQUE: Multidetector CT imaging of the abdomen and pelvis was performed using the standard protocol following bolus administration of intravenous contrast. CONTRAST:  75mL ISOVUE-300 IOPAMIDOL (ISOVUE-300) INJECTION 61% COMPARISON:  CT scan  dated 12/09/2014 FINDINGS: Lower chest: Borderline cardiomegaly. Minimal scarring at the left lung base. Small hiatal hernia. Hepatobiliary: No focal liver abnormality is seen. No gallstones, gallbladder wall thickening, or biliary dilatation. Pancreas: Unremarkable. No pancreatic ductal dilatation or surrounding inflammatory changes. Spleen: Normal in size without focal abnormality. Adrenals/Urinary  Tract: The adrenal glands are normal. There are numerous stones in the distal left ureter with chronic left hydronephrosis as well as multiple cysts in the left kidney as well as a 17 mm cyst in the lower pole the right kidney. There is a 2 mm stone in the right ureterovesical junction with the least 5 stones in the distal left ureter including 3 in the left ureterovesical junction and an 8 mm stone in the distal left ureter proximal to the UVJ. Stomach/Bowel: Small hiatal hernia.  Otherwise normal. Vascular/Lymphatic: Extensive aortic atherosclerosis. No adenopathy. Reproductive: Enlarged prostate gland. Other: No free air or free fluid. Musculoskeletal: No acute or significant osseous findings. IMPRESSION: 1. Numerous stones in the distal left ureter creating left hydronephrosis and perinephric soft tissue stranding. 2. Tiny stone in the right ureterovesical junction with only minimal dilatation of the right ureter. 3. Small hiatal hernia. Electronically Signed   By: Francene BoyersJames  Maxwell M.D.   On: 03/27/2016 12:47    EKG:   Orders placed or performed during the hospital encounter of 02/24/15  . EKG 12-Lead  . EKG 12-Lead    IMPRESSION AND PLAN:   Latanya MaudlinJames Kraeger  is a 83 y.o. male with a known history of GERD, Alzheimer's dementia, paroxysmal atrial fib relation on eliquis is presented to the ED with a chief complaint of left lower lower quadrant abdominal pain for the past 3 days. Associated with nausea and vomiting. Patient had history of inguinal hernias completely. In the right inguinal region. CT abdomen has revealed left UVJ 8 mm stone. Urology is consulted Dr. Tiburcio PeaHarris is going to take him to the OR as soon as possible for hydronephrosis.  # Acute kidney injury postrenal from UVJ stone and hydronephrosis on the left side Admit to MedSurg unit Foley catheter Urology Dr. Sherrill RaringHarriCK is informed , taking him to work as soon as possible IV levofloxacin Pain management as needed  #History of chronic  atrial fibrillation rate controlled Hold Eliquis  #Possible UTI Urine culture and sensitivity and IV levofloxacin  #Hypertension Hold by mouth medications IV Lopressor as needed  All the records are reviewed and case discussed with ED provider. Management plans discussed with the patient, family and they are in agreement.  CODE STATUS: fc,Patient's son and the girlfriend Ms. Hawkins at the healthcare power of attorney  TOTAL TIME TAKING CARE OF THIS PATIENT:  45  minutes.   Note: This dictation was prepared with Dragon dictation along with smaller phrase technology. Any transcriptional errors that result from this process are unintentional.  Ramonita LabGouru, Rajvi Armentor M.D on 03/27/2016 at 3:07 PM  Between 7am to 6pm - Pager - [phone]  After 6pm go to [email or web address] - password [email or web address] [facility]  [facility]  Office  [phone]  CC: Primary care physician; Marguarite ArbourSPARKS,JEFFREY D, MD

## 2016-03-27 NOTE — Anesthesia Post-op Follow-up Note (Cosign Needed)
Anesthesia QCDR form completed.        

## 2016-03-27 NOTE — Interval H&P Note (Signed)
History and Physical Interval Note:  03/27/2016 3:14 PM  William PigeonJames B Salguero Sr.  has presented today for surgery, with the diagnosis of n/a  The various methods of treatment have been discussed with the patient and family. After consideration of risks, benefits and other options for treatment, the patient has consented to  Procedure(s): CYSTOSCOPY WITH RETROGRADE PYELOGRAM (Bilateral) CYSTOSCOPY WITH STENT PLACEMENT (Left) as a surgical intervention .  The patient's history has been reviewed, patient examined, no change in status, stable for surgery.  I have reviewed the patient's chart and labs.  Questions were answered to the patient's satisfaction.     Vanna ScotlandAshley Sharalyn Lomba

## 2016-03-27 NOTE — Anesthesia Postprocedure Evaluation (Signed)
Anesthesia Post Note  Patient: William PigeonJames B Suliman Sr.  Procedure(s) Performed: Procedure(s) (LRB): CYSTOSCOPY WITH RETROGRADE PYELOGRAM (Bilateral) CYSTOSCOPY WITH STENT PLACEMENT (Bilateral)  Patient location during evaluation: PACU Anesthesia Type: General Level of consciousness: awake and alert and oriented Pain management: pain level controlled Vital Signs Assessment: post-procedure vital signs reviewed and stable Respiratory status: spontaneous breathing Cardiovascular status: blood pressure returned to baseline Anesthetic complications: no     Last Vitals:  Vitals:   03/27/16 1447 03/27/16 1615  BP: 135/79 (!) 147/121  Pulse: 75 75  Resp: 18 14  Temp: 36.6 C 36.2 C    Last Pain:  Vitals:   03/27/16 1447  TempSrc: Tympanic  PainSc:                  William Lozano

## 2016-03-27 NOTE — ED Triage Notes (Signed)
Pt presents with possible kidney stone and or hernia. Left lower pelvic pain started yesterday.

## 2016-03-27 NOTE — Op Note (Signed)
Date of procedure: 03/27/16  Preoperative diagnosis:  1. Bilateral ureteral calculi 2. Acute renal failure   Postoperative diagnosis:  1. Same as above   Procedure: 1. Cystoscopy 2. Bilateral retrograde pyelogram 3. Bilateral ureteral stent placement 4. Foley catheter placement  Surgeon: Vanna ScotlandAshley Shulamis Wenberg, MD  Anesthesia: General  Complications: None  Intraoperative findings: Abnormal filling defect within the right distal ureter concerning for possible retained stone. Significant multiple filling defects within the left distal ureter consistent with multiple ureteral stones seen on CT scan with proximal hydroureteronephrosis.  EBL: Minimal  Specimens: None  Drains: 6 x 26 French double-J ureteral stent bilaterally, 60 French Foley catheter  Indication: William RegulusJames B Cloyd Sr. is a 83 y.o. patient with presenting with a lower ureteral calculi, small right distal stone as well as multiple obstructing left distal ureteral stones in the setting of leukocytosis and acute renal failure.  After reviewing the management options for treatment, he elected to proceed with the above surgical procedure(s). We have discussed the potential benefits and risks of the procedure, side effects of the proposed treatment, the likelihood of the patient achieving the goals of the procedure, and any potential problems that might occur during the procedure or recuperation. Informed consent has been obtained.  Description of procedure:  The patient was taken to the operating room and general anesthesia was induced.  The patient was placed in the dorsal lithotomy position, prepped and draped in the usual sterile fashion, and preoperative antibiotics were administered. A preoperative time-out was performed.   A 21 French scope was advanced per urethra into the bladder. Note, the patient had significant trilobar coaptation with an elevated bladder neck. His bladder was heavily trabeculated with significant saccules  and small diverticula. Attention was then turned to the right ureteral orifice which was cannulated using a 5 [place] open-ended ureteral catheter. A gentle retrograde pyelogram was performed which revealed a decompressed appearing collecting system but a small defect within the right distal ureter concerning for possible retained stone as seen on CT scan. As such, the decision was made to place ureteral stent on the side. A sensor is placed easily up to level of the kidney under fluoroscopic guidance. A 6 x 26 French double-J ureteral stent was advanced over the wire to level of the renal pelvis. There is partially drawn until full coil was noted within the renal pelvis. The wire was then fully withdrawn and full coil was noted within the bladder.  Next, attention was turned to left ureter. A 5 French open-ended ureteral catheter was placed just within the UO. A gentle retrograde pyelogram performed on the side revealed multiple filling defects within the left distal ureter and mild to moderate hydroureteronephrosis down to this level. Carefully, a sensor wire was able to be passed around this up to level of the kidney.  A 6 x 26 French double-J ureteral stent was advanced over this wire to level of kidney. There was partially drawn until full coil was noted within the renal pelvis. The wire was then fully withdrawn and a full coil was noted within the bladder. Finally, the bladder was then drained. A 16 French Foley catheter was placed in the balloon was filled with 10 cc of sterile water. The catheter was secured to the patient's leg using a leg strap. He was then repositioned supine position, reversed from anesthesia, taken the PACU in stable condition.  Plan: Patient will be admitted to the medical service for overnight observation. Follow-up urine cultures and adjust as needed.  We will arrange for definitive management of his stones in 1-2 weeks after the infection is resolved.  Vanna Scotland, M.D.

## 2016-03-27 NOTE — ED Provider Notes (Signed)
[facility] Emergency Department Provider Note  ____________________________________________   First MD Initiated Contact with Patient 03/27/16 1020     (approximate)  I have reviewed the triage vital signs and the nursing notes.   HISTORY  Chief Complaint Abdominal Pain (left lower pelvic)   HPI William Lozano. is a 83 y.o. male with a history of diabetes as well as atrial fibrillation on eliquis was present emergency department and left lower quadrant pain over the past 3 days.  The patient says that he has a history of inguinal hernias and has had 3 repairs to the right inguinal region. However, over the past 3 days he has been having cramping left lower quadrant abdominal pain that is associated with nausea and vomiting. He says the last time he vomited was on Saturday. He says that he was only able to eat 3 bites of food yesterday and had a bowl of cereal this morning. He did not report any burning with urination or urinary retention. A history of kidney stones as well.   Past Medical History:  Diagnosis Date  . Arthritis   . Atrial fibrillation (HCC) 2016   paroxysmal atrial fibrillation, sick sinus syndrome  . Dementia    alzheimers  . Essential hypertension   . Hyperlipemia   . Kidney stones     Patient Active Problem List   Diagnosis Date Noted  . Cardiac arrhythmia 07/22/2015  . Atrial fibrillation with RVR (HCC) 02/24/2015  . Cardiac conduction disorder 02/04/2015  . Arthritis 02/04/2015  . Atrial fibrillation (HCC) 02/04/2015  . Acid reflux 02/04/2015  . H/O vertigo 02/04/2015  . HLD (hyperlipidemia) 02/04/2015  . BP (high blood pressure) 02/04/2015  . Calculus of kidney 02/04/2015  . Sick sinus syndrome (HCC) 02/04/2015  . Fungal infection of nail 02/04/2015  . Type 2 diabetes mellitus (HCC) 02/04/2015  . Sepsis (HCC) 12/10/2014  . Systemic infection (HCC) 12/10/2014  . Altered bowel function 10/13/2014  . Chronic diarrhea  10/13/2014  . Change in bowel habits 10/13/2014  . Dementia in Alzheimer's disease with late onset 09/20/2014  . Alzheimer's dementia, late onset 09/20/2014  . Drug resistance 10/22/2013    Past Surgical History:  Procedure Laterality Date  . CYSTOSCOPY W/ URETERAL STENT PLACEMENT Bilateral 02/24/2015   Procedure: CYSTOSCOPY WITH STENT REPLACEMENT;  Surgeon: Hildred Laser, MD;  Location: [facility] ORS;  Service: Urology;  Laterality: Bilateral;  . CYSTOSCOPY WITH RETROGRADE PYELOGRAM, URETEROSCOPY AND STENT PLACEMENT Bilateral 12/10/2014   Procedure: CYSTOSCOPY WITH RETROGRADE PYELOGRAM, URETEROSCOPY AND STENT PLACEMENT;  Surgeon: Hildred Laser, MD;  Location: [facility] ORS;  Service: Urology;  Laterality: Bilateral;  . CYSTOSCOPY WITH STENT PLACEMENT Bilateral 12/10/2014   Procedure: CYSTOSCOPY WITH STENT PLACEMENT;  Surgeon: Hildred Laser, MD;  Location: [facility] ORS;  Service: Urology;  Laterality: Bilateral;  . EXTRACORPOREAL SHOCK WAVE LITHOTRIPSY Right 01/07/2015   Procedure: EXTRACORPOREAL SHOCK WAVE LITHOTRIPSY (ESWL);  Surgeon: Lorraine Lax, MD;  Location: [facility] ORS;  Service: Urology;  Laterality: Right;  . EXTRACORPOREAL SHOCK WAVE LITHOTRIPSY Left 01/21/2015   Procedure: EXTRACORPOREAL SHOCK WAVE LITHOTRIPSY (ESWL);  Surgeon: Hildred Laser, MD;  Location: [facility] ORS;  Service: Urology;  Laterality: Left;  . EYE SURGERY Bilateral    Cataract Extraction with IOL  . HERNIA REPAIR Bilateral    inguinal  . URETEROSCOPY WITH HOLMIUM LASER LITHOTRIPSY Bilateral 02/24/2015   Procedure: URETEROSCOPY WITH HOLMIUM LASER LITHOTRIPSY /STONE BASKETING;  Surgeon: Hildred Laser, MD;  Location: [facility] ORS;  Service:  Urology;  Laterality: Bilateral;    Prior to Admission medications   Medication Sig Start Date End Date Taking? Authorizing Provider  apixaban (ELIQUIS) 5 MG TABS tablet Take 5 mg by mouth 2 (two) times daily.   Yes Historical Provider, MD  hydrochlorothiazide (HYDRODIURIL) 25  MG tablet Take 1 tablet (25 mg total) by mouth daily as needed. Patient taking differently: Take 25 mg by mouth daily.  12/14/14  Yes Marguarite Arbour, MD  metoprolol succinate (TOPROL-XL) 25 MG 24 hr tablet Take 25 mg by mouth daily.   Yes Historical Provider, MD  pantoprazole (PROTONIX) 40 MG tablet Take 40 mg by mouth daily.   Yes Historical Provider, MD  rivastigmine (EXELON) 1.5 MG capsule Take 1.5 mg by mouth 2 (two) times daily.   Yes Historical Provider, MD  simvastatin (ZOCOR) 20 MG tablet Take 20 mg by mouth daily.   Yes Historical Provider, MD  silodosin (RAPAFLO) 8 MG CAPS capsule Take 1 capsule (8 mg total) by mouth daily with breakfast. Patient not taking: Reported on 07/22/2015 03/11/15   Hildred Laser, MD    Allergies Codeine; Diazepam; Ibuprofen; Penicillin g; and Sulfa antibiotics  Family History  Problem Relation Age of Onset  . Hypertension    . Kidney disease Neg Hx   . Prostate cancer Neg Hx     Social History Social History  Substance Use Topics  . Smoking status: Never Smoker  . Smokeless tobacco: Never Used  . Alcohol use No    Review of Systems Constitutional: No fever/chills Eyes: No visual changes. ENT: No sore throat. Cardiovascular: Denies chest pain. Respiratory: Denies shortness of breath. Gastrointestinal:  No diarrhea.  No constipation. Genitourinary: Negative for dysuria. Musculoskeletal: Negative for back pain. Skin: Negative for rash. Neurological: Negative for headaches, focal weakness or numbness.  10-point ROS otherwise negative.  ____________________________________________   PHYSICAL EXAM:  VITAL SIGNS: ED Triage Vitals  Enc Vitals Group     BP 03/27/16 1012 137/87     Pulse Rate 03/27/16 1012 74     Resp 03/27/16 1012 18     Temp 03/27/16 1012 97.7 F (36.5 C)     Temp Source 03/27/16 1012 Oral     SpO2 03/27/16 1012 98 %     Weight 03/27/16 1012 140 lb (63.5 kg)     Height 03/27/16 1012 5\' 7"  (1.702 m)     Head  Circumference --      Peak Flow --      Pain Score 03/27/16 1013 5     Pain Loc --      Pain Edu? --      Excl. in GC? --     Constitutional: Alert and oriented. Well appearing and in no acute distress. Eyes: Conjunctivae are normal. PERRL. EOMI. Head: Atraumatic. Nose: No congestion/rhinnorhea. Mouth/Throat: Mucous membranes are moist.   Neck: No stridor.   Cardiovascular: Normal rate, regular rhythm. Grossly normal heart sounds.  Good peripheral circulation. Respiratory: Normal respiratory effort.  No retractions. Lungs CTAB. Gastrointestinal: Soft with mild left lower quadrant abdominal tenderness palpation. No rebound or guarding. No distention.  Genitourinary: Normal external appearance in this uncircumcised male. No masses or bulging that were obvious to gross examination. No tenderness palpation to the bilateral inguinal regions and no hernia sacs palpated. No scrotal masses. No testicular or epididymal pain or swelling. Musculoskeletal: No lower extremity tenderness nor edema.   Neurologic:  Normal speech and language. No gross focal neurologic deficits are appreciated. No gait instability.  Skin:  Skin is warm, dry and intact. No rash noted. Psychiatric: Mood and affect are normal. Speech and behavior are normal.  ____________________________________________   LABS (all labs ordered are listed, but only abnormal results are displayed)  Labs Reviewed  CBC WITH DIFFERENTIAL/PLATELET - Abnormal; Notable for the following:       Result Value   WBC 20.9 (*)    Hemoglobin 18.4 (*)    HCT 53.4 (*)    Neutro Abs 18.1 (*)    Lymphs Abs 0.9 (*)    Monocytes Absolute 1.8 (*)    All other components within normal limits  COMPREHENSIVE METABOLIC PANEL - Abnormal; Notable for the following:    Chloride 99 (*)    Glucose, Bld 156 (*)    BUN 29 (*)    Creatinine, Ser 1.91 (*)    ALT 14 (*)    Total Bilirubin 1.9 (*)    GFR calc non Af Amer 31 (*)    GFR calc Af Amer 36 (*)     All other components within normal limits  URINALYSIS, COMPLETE (UACMP) WITH MICROSCOPIC - Abnormal; Notable for the following:    Color, Urine YELLOW (*)    APPearance HAZY (*)    Hgb urine dipstick LARGE (*)    Protein, ur 30 (*)    All other components within normal limits  LIPASE, BLOOD   ____________________________________________  EKG   ____________________________________________  RADIOLOGY  CT Abdomen Pelvis W Contrast (Final result)  Result time 03/27/16 12:47:21  Final result by Francene BoyersJames Maxwell, MD (03/27/16 12:47:21)           Narrative:   CLINICAL DATA: Left lower quadrant abdominal pain with nausea, vomiting, and fever.  EXAM: CT ABDOMEN AND PELVIS WITH CONTRAST  TECHNIQUE: Multidetector CT imaging of the abdomen and pelvis was performed using the standard protocol following bolus administration of intravenous contrast.  CONTRAST: 75mL ISOVUE-300 IOPAMIDOL (ISOVUE-300) INJECTION 61%  COMPARISON: CT scan dated 12/09/2014  FINDINGS: Lower chest: Borderline cardiomegaly. Minimal scarring at the left lung base. Small hiatal hernia.  Hepatobiliary: No focal liver abnormality is seen. No gallstones, gallbladder wall thickening, or biliary dilatation.  Pancreas: Unremarkable. No pancreatic ductal dilatation or surrounding inflammatory changes.  Spleen: Normal in size without focal abnormality.  Adrenals/Urinary Tract: The adrenal glands are normal. There are numerous stones in the distal left ureter with chronic left hydronephrosis as well as multiple cysts in the left kidney as well as a 17 mm cyst in the lower pole the right kidney. There is a 2 mm stone in the right ureterovesical junction with the least 5 stones in the distal left ureter including 3 in the left ureterovesical junction and an 8 mm stone in the distal left ureter proximal to the UVJ.  Stomach/Bowel: Small hiatal hernia. Otherwise normal.  Vascular/Lymphatic: Extensive aortic  atherosclerosis. No adenopathy.  Reproductive: Enlarged prostate gland.  Other: No free air or free fluid.  Musculoskeletal: No acute or significant osseous findings.  IMPRESSION: 1. Numerous stones in the distal left ureter creating left hydronephrosis and perinephric soft tissue stranding. 2. Tiny stone in the right ureterovesical junction with only minimal dilatation of the right ureter. 3. Small hiatal hernia.   Electronically Signed By: Francene BoyersJames Maxwell M.D. On: 03/27/2016 12:47          ____________________________________________   PROCEDURES  Procedure(s) performed:   Procedures  Critical Care performed:   ____________________________________________   INITIAL IMPRESSION / ASSESSMENT AND PLAN / ED COURSE  Pertinent labs &  imaging results that were available during my care of the patient were reviewed by me and considered in my medical decision making (see chart for details).  ----------------------------------------- 2:12 PM on 03/27/2016 -----------------------------------------  Patient with multiple kidney stones with stranding found the CAT scan. Also with a white blood cell count of 20. Patient to be dispositioned to the emergency department. Seen and evaluated by Dr. Marlou Porch. Signed out to Dr. Amado Coe of the medicine service as per urology's request. Patient aware of the diagnosis as well as the plan and willing to comply.      ____________________________________________   FINAL CLINICAL IMPRESSION(S) / ED DIAGNOSES  Final diagnoses:  Kidney stones      NEW MEDICATIONS STARTED DURING THIS VISIT:  New Prescriptions   No medications on file     Note:  This document was prepared using Dragon voice recognition software and may include unintentional dictation errors.    Myrna Blazer, MD 03/27/16 [phone]

## 2016-03-27 NOTE — ED Notes (Signed)
Report given to Debbie in same day surgrey

## 2016-03-27 NOTE — Anesthesia Preprocedure Evaluation (Signed)
Anesthesia Evaluation  Patient identified by MRN, date of birth, ID band Patient awake    Reviewed: Allergy & Precautions, NPO status , Patient's Chart, lab work & pertinent test results, reviewed documented beta blocker date and time   Airway Mallampati: II       Dental  (+) Teeth Intact   Pulmonary neg pulmonary ROS,    Pulmonary exam normal        Cardiovascular Exercise Tolerance: Good hypertension, Pt. on medications and Pt. on home beta blockers + dysrhythmias Atrial Fibrillation  Rhythm:Irregular Rate:Tachycardia     Neuro/Psych dementianegative neurological ROS     GI/Hepatic Neg liver ROS, GERD  ,  Endo/Other  diabetes, Well Controlled, Type 2  Renal/GU Renal diseaseKidney stones     Musculoskeletal  (+) Arthritis , Osteoarthritis,    Abdominal Normal abdominal exam  (+)   Peds negative pediatric ROS (+)  Hematology negative hematology ROS (+)   Anesthesia Other Findings   Reproductive/Obstetrics                             Anesthesia Physical  Anesthesia Plan  ASA: III  Anesthesia Plan: General   Post-op Pain Management:    Induction: Intravenous  Airway Management Planned: Oral ETT  Additional Equipment:   Intra-op Plan:   Post-operative Plan: Extubation in OR  Informed Consent: I have reviewed the patients History and Physical, chart, labs and discussed the procedure including the risks, benefits and alternatives for the proposed anesthesia with the patient or authorized representative who has indicated his/her understanding and acceptance.     Plan Discussed with: CRNA  Anesthesia Plan Comments:         Anesthesia Quick Evaluation

## 2016-03-27 NOTE — H&P (Signed)
I have been asked to see the patient by Dr. Gladstone Pihavid Schaevitz, for evaluation and management of obstructing left ureteral stone.  History of present illness:  83 year old male with a history of nephrolithiasis who presented to the emergency department today with 2 days of worsening left-sided abdominal pain with associated nausea and vomiting. Currently, the patient has not had any associated nausea, but in the days leading up to his presentation he was having severe nausea with vomiting and lower left abdominal pain. He is also had some urinary frequency and urgency. He denies any gross hematuria or dysuria. He has not had any associated fevers or chills.   The patient underwent a CT scan in the ER, it was notable for severe left-sided hydroureteronephrosis down to the level of the distal left ureter where the patient was noted to have a a large 8 mm obstructing stone with a smaller stone directly behind it. In addition, there were other small stones within the bladder. There may be an additional stone at the patient's right UVJ, this could also just be a free-floating stone. There is not any significant hydronephrosis on the right.  The patient was noted to have a white blood cell count 20,000, his creatinine was around his baseline. His urinalysis demonstrated red blood cells without any clear signs of infection.   The patient does have a history of urosepsis from obstructing stones in the past.  He has had shockwave lithotripsy unsuccessfully in the past requiring follow-up ureteroscopy. His most recent procedure was 1 year prior.   Review of systems: A 12 point comprehensive review of systems was obtained and is negative unless otherwise stated in the history of present illness.  Patient Active Problem List   Diagnosis Date Noted  . Cardiac arrhythmia 07/22/2015  . Atrial fibrillation with RVR (HCC) 02/24/2015  . Cardiac conduction disorder 02/04/2015  . Arthritis 02/04/2015  . Atrial  fibrillation (HCC) 02/04/2015  . Acid reflux 02/04/2015  . H/O vertigo 02/04/2015  . HLD (hyperlipidemia) 02/04/2015  . BP (high blood pressure) 02/04/2015  . Calculus of kidney 02/04/2015  . Sick sinus syndrome (HCC) 02/04/2015  . Fungal infection of nail 02/04/2015  . Type 2 diabetes mellitus (HCC) 02/04/2015  . Sepsis (HCC) 12/10/2014  . Systemic infection (HCC) 12/10/2014  . Altered bowel function 10/13/2014  . Chronic diarrhea 10/13/2014  . Change in bowel habits 10/13/2014  . Dementia in Alzheimer's disease with late onset 09/20/2014  . Alzheimer's dementia, late onset 09/20/2014  . Drug resistance 10/22/2013    No current facility-administered medications on file prior to encounter.    Current Outpatient Prescriptions on File Prior to Encounter  Medication Sig Dispense Refill  . apixaban (ELIQUIS) 5 MG TABS tablet Take 5 mg by mouth 2 (two) times daily.    . hydrochlorothiazide (HYDRODIURIL) 25 MG tablet Take 1 tablet (25 mg total) by mouth daily as needed. (Patient taking differently: Take 25 mg by mouth daily. ) 30 tablet 0  . metoprolol succinate (TOPROL-XL) 25 MG 24 hr tablet Take 25 mg by mouth daily.    . pantoprazole (PROTONIX) 40 MG tablet Take 40 mg by mouth daily.    . simvastatin (ZOCOR) 20 MG tablet Take 20 mg by mouth daily.    . silodosin (RAPAFLO) 8 MG CAPS capsule Take 1 capsule (8 mg total) by mouth daily with breakfast. (Patient not taking: Reported on 07/22/2015) 30 capsule 11    Past Medical History:  Diagnosis Date  . Arthritis   . Atrial  fibrillation (HCC) 2016   paroxysmal atrial fibrillation, sick sinus syndrome  . Dementia    alzheimers  . Essential hypertension   . Hyperlipemia   . Kidney stones     Past Surgical History:  Procedure Laterality Date  . CYSTOSCOPY W/ URETERAL STENT PLACEMENT Bilateral 02/24/2015   Procedure: CYSTOSCOPY WITH STENT REPLACEMENT;  Surgeon: Hildred Laser, MD;  Location: [facility];  Service: Urology;  Laterality:  Bilateral;  . CYSTOSCOPY WITH RETROGRADE PYELOGRAM, URETEROSCOPY AND STENT PLACEMENT Bilateral 12/10/2014   Procedure: CYSTOSCOPY WITH RETROGRADE PYELOGRAM, URETEROSCOPY AND STENT PLACEMENT;  Surgeon: Hildred Laser, MD;  Location: [facility];  Service: Urology;  Laterality: Bilateral;  . CYSTOSCOPY WITH STENT PLACEMENT Bilateral 12/10/2014   Procedure: CYSTOSCOPY WITH STENT PLACEMENT;  Surgeon: Hildred Laser, MD;  Location: [facility];  Service: Urology;  Laterality: Bilateral;  . EXTRACORPOREAL SHOCK WAVE LITHOTRIPSY Right 01/07/2015   Procedure: EXTRACORPOREAL SHOCK WAVE LITHOTRIPSY (ESWL);  Surgeon: Lorraine Lax, MD;  Location: [facility];  Service: Urology;  Laterality: Right;  . EXTRACORPOREAL SHOCK WAVE LITHOTRIPSY Left 01/21/2015   Procedure: EXTRACORPOREAL SHOCK WAVE LITHOTRIPSY (ESWL);  Surgeon: Hildred Laser, MD;  Location: [facility];  Service: Urology;  Laterality: Left;  . EYE SURGERY Bilateral    Cataract Extraction with IOL  . HERNIA REPAIR Bilateral    inguinal  . URETEROSCOPY WITH HOLMIUM LASER LITHOTRIPSY Bilateral 02/24/2015   Procedure: URETEROSCOPY WITH HOLMIUM LASER LITHOTRIPSY /STONE BASKETING;  Surgeon: Hildred Laser, MD;  Location: [facility];  Service: Urology;  Laterality: Bilateral;    Social History  Substance Use Topics  . Smoking status: Never Smoker  . Smokeless tobacco: Never Used  . Alcohol use No    Family History  Problem Relation Age of Onset  . Hypertension    . Kidney disease Neg Hx   . Prostate cancer Neg Hx     PE: Vitals:   03/27/16 1012 03/27/16 1144  BP: 137/87 126/67  Pulse: 74 65  Resp: 18 18  Temp: 97.7 F (36.5 C)   TempSrc: Oral   SpO2: 98%   Weight: 63.5 kg (140 lb)   Height: 5\' 7"  (1.702 m)    Patient appears to be in no acute distress  patient is alert and oriented x3 Atraumatic normocephalic head No cervical or supraclavicular lymphadenopathy appreciated No increased work of breathing, no audible  wheezes/rhonchi Regular sinus rhythm/rate Abdomen is soft, nontender, nondistended, no CVA or suprapubic tenderness Lower extremities are symmetric without appreciable edema Grossly neurologically intact No identifiable skin lesions   Recent Labs  03/27/16 1100  WBC 20.9*  HGB 18.4*  HCT 53.4*    Recent Labs  03/27/16 1100  NA 136  K 4.0  CL 99*  CO2 28  GLUCOSE 156*  BUN 29*  CREATININE 1.91*  CALCIUM 10.0   No results for input(s): LABPT, INR in the last 72 hours. No results for input(s): LABURIN in the last 72 hours. Results for orders placed or performed in visit on 07/22/15  Microscopic Examination     Status: Abnormal   Collection Time: 07/22/15  9:40 AM  Result Value Ref Range Status   WBC, UA 0-5 0 - 5 /hpf Final   RBC, UA 0-2 0 - 2 /hpf Final   Epithelial Cells (non renal) None seen 0 - 10 /hpf Final   Mucus, UA Present (A) Not Estab. Final   Bacteria, UA None seen None seen/Few Final    Imaging: I have independently reviewed the patient's  CT scan demonstrating severe left-sided hydroureteronephrosis down to the distal ureter where the patient has 2 stones, the largest measuring approximately 8 mm causing severe obstruction. There are some loose stones within the patient's bladder. In addition, the patient has a stone located at the right UVJ.   Imp: The patient has left-sided high-grade ureteral obstruction with possible A right side distal ureteral stone is well based on his CT scan. He has a white blood cell count of 20,000 with a reasonably normal creatinine. He does have a history of urosepsis. He looks reasonably well currently, but given his past history as well as comorbidities, I think he would benefit from cystoscopy, bilateral retrograde pyelograms, left ureteral stent placement followed by admission and observation.  Recommendations:  I discussed the treatment options for this patient in detail. Our plan is to proceed to the operating room on  urgent basis for cystoscopy, bilateral retrograde pyelograms, and left ureteral stent placement.  The patient will then be admitted to the hospital service for observation. This procedure will be performed by Dr. Vanna Scotland, M.D.   Berniece Salines W

## 2016-03-27 NOTE — Progress Notes (Signed)
Pharmacy Antibiotic Note  William RegulusJames B Tiberio Sr. is a 83 y.o. male admitted on 03/27/2016 with N/V and left sided abd pain. Now being treated for UTI   Pharmacy has been consulted for levofloxacin dosing.  Plan: Will start patient on levofloxacin 250mg  PO every 24 hours.  Recommend days of therapy is 3 days.   Height: 5\' 7"  (170.2 cm) Weight: 140 lb (63.5 kg) IBW/kg (Calculated) : 66.1  Temp (24hrs), Avg:97.8 F (36.6 C), Min:97.1 F (36.2 C), Max:98.3 F (36.8 C)   Recent Labs Lab 03/27/16 1100  WBC 20.9*  CREATININE 1.91*    Estimated Creatinine Clearance: 27.2 mL/min (by C-G formula based on SCr of 1.91 mg/dL (H)).    Allergies  Allergen Reactions  . Codeine Other (See Comments)    Causes side effects  . Diazepam Other (See Comments)  . Ibuprofen Other (See Comments)  . Penicillin G Rash  . Sulfa Antibiotics Rash    Antimicrobials this admission: 2/5 ceftriaxone >>  2/5 levofloxacin >>   Dose adjustments this admission:  Microbiology results:  2/5  UCx: sent   Thank you for allowing pharmacy to be a part of this patient's care.  Gardner CandleSheema M Chevez Sambrano, PharmD, BCPS Clinical Pharmacist 03/27/2016 5:21 PM

## 2016-03-27 NOTE — ED Notes (Signed)
Family states vomiting since Saturday, states LLQ abd pain and abd swelling, at present pt awake and alert

## 2016-03-27 NOTE — Transfer of Care (Signed)
Immediate Anesthesia Transfer of Care Note  Patient: William PigeonJames B Kassis Sr.  Procedure(s) Performed: Procedure(s): CYSTOSCOPY WITH RETROGRADE PYELOGRAM (Bilateral) CYSTOSCOPY WITH STENT PLACEMENT (Bilateral)  Patient Location: PACU  Anesthesia Type:General  Level of Consciousness: awake, sedated and confused  Airway & Oxygen Therapy: Patient Spontanous Breathing and Patient connected to face mask oxygen  Post-op Assessment: Report given to RN and Post -op Vital signs reviewed and stable  Post vital signs: Reviewed and stable  Last Vitals:  Vitals:   03/27/16 1144 03/27/16 1447  BP: 126/67 135/79  Pulse: 65 75  Resp: 18 18  Temp:  36.6 C    Last Pain:  Vitals:   03/27/16 1447  TempSrc: Tympanic  PainSc:          Complications: No apparent anesthesia complications

## 2016-03-28 ENCOUNTER — Encounter: Payer: Self-pay | Admitting: Urology

## 2016-03-28 DIAGNOSIS — N200 Calculus of kidney: Secondary | ICD-10-CM

## 2016-03-28 DIAGNOSIS — R8281 Pyuria: Secondary | ICD-10-CM

## 2016-03-28 DIAGNOSIS — N209 Urinary calculus, unspecified: Secondary | ICD-10-CM

## 2016-03-28 LAB — CBC
HCT: 44.1 % (ref 40.0–52.0)
HEMOGLOBIN: 15.2 g/dL (ref 13.0–18.0)
MCH: 31.2 pg (ref 26.0–34.0)
MCHC: 34.4 g/dL (ref 32.0–36.0)
MCV: 90.8 fL (ref 80.0–100.0)
Platelets: 234 10*3/uL (ref 150–440)
RBC: 4.86 MIL/uL (ref 4.40–5.90)
RDW: 14.2 % (ref 11.5–14.5)
WBC: 18.4 10*3/uL — ABNORMAL HIGH (ref 3.8–10.6)

## 2016-03-28 LAB — COMPREHENSIVE METABOLIC PANEL
ALT: 12 U/L — ABNORMAL LOW (ref 17–63)
ANION GAP: 7 (ref 5–15)
AST: 23 U/L (ref 15–41)
Albumin: 3.6 g/dL (ref 3.5–5.0)
Alkaline Phosphatase: 56 U/L (ref 38–126)
BUN: 25 mg/dL — AB (ref 6–20)
CALCIUM: 8.6 mg/dL — AB (ref 8.9–10.3)
CO2: 25 mmol/L (ref 22–32)
Chloride: 106 mmol/L (ref 101–111)
Creatinine, Ser: 1.4 mg/dL — ABNORMAL HIGH (ref 0.61–1.24)
GFR, EST AFRICAN AMERICAN: 53 mL/min — AB (ref >60)
GFR, EST NON AFRICAN AMERICAN: 46 mL/min — AB (ref >60)
Glucose, Bld: 142 mg/dL — ABNORMAL HIGH (ref 65–99)
POTASSIUM: 4 mmol/L (ref 3.5–5.1)
Sodium: 138 mmol/L (ref 135–145)
TOTAL PROTEIN: 6.1 g/dL — AB (ref 6.5–8.1)
Total Bilirubin: 1.4 mg/dL — ABNORMAL HIGH (ref 0.3–1.2)

## 2016-03-28 MED ORDER — LEVOFLOXACIN 250 MG PO TABS: 250.0000 mg | ORAL_TABLET | Freq: Every day | ORAL | 0 refills | Status: DC

## 2016-03-28 MED ORDER — PIPERACILLIN-TAZOBACTAM 3.375 G IVPB: 3.3750 g | Freq: Three times a day (TID) | INTRAVENOUS | Status: DC

## 2016-03-28 MED ORDER — ENOXAPARIN SODIUM 40 MG/0.4ML ~~LOC~~ SOLN: 40.0000 mg | SUBCUTANEOUS | Status: DC

## 2016-03-28 MED ORDER — TRAMADOL HCL 50 MG PO TABS: 50.0000 mg | ORAL_TABLET | Freq: Four times a day (QID) | ORAL | 0 refills | Status: DC | PRN

## 2016-03-28 NOTE — Progress Notes (Signed)
Patient discharged to home. Pt wife aware and agreeable. Pt voided 175 following catheter removal. Concerns addressed. Prescription given. IV site removed. MD aware.

## 2016-03-28 NOTE — Progress Notes (Signed)
Anticoagulation monitoring(Lovenox):  83 yo  male ordered Lovenox 30 mg Q24h  Filed Weights   03/27/16 1012 03/27/16 1708  Weight: 140 lb (63.5 kg) 140 lb (63.5 kg)   Body mass index is 21.93 kg/m.   Lab Results  Component Value Date   CREATININE 1.40 (H) 03/28/2016   CREATININE 1.91 (H) 03/27/2016   CREATININE 1.50 (H) 03/03/2015   Estimated Creatinine Clearance: 37.2 mL/min (by C-G formula based on SCr of 1.4 mg/dL (H)). Hemoglobin & Hematocrit     Component Value Date/Time   HGB 15.2 03/28/2016 0829   HCT 44.1 03/28/2016 0829     Per Protocol for Patient with estCrcl > 30 ml/min and BMI < 40, will transition to Lovenox 40 mg Q24h.

## 2016-03-28 NOTE — Progress Notes (Signed)
Pharmacy Antibiotic Note  William RegulusJames B Cottam Sr. is a 83 y.o. male admitted on 03/27/2016 with Complicated UTI.  Pharmacy has been consulted for Zosyn dosing.  Plan: Will start patient on zosyn 3.375 IV every 8 hours.  PCN allergy acknowledged by Dr. Winona LegatoVaickute. Possible rash development from receiving PCN in past. Spoke with nurse and about possible allergy and watching patient once infusion starts.   Height: 5\' 7"  (170.2 cm) Weight: 140 lb (63.5 kg) IBW/kg (Calculated) : 66.1  Temp (24hrs), Avg:97.9 F (36.6 C), Min:97.5 F (36.4 C), Max:98.4 F (36.9 C)   Recent Labs Lab 03/27/16 1100 03/28/16 0829  WBC 20.9* 18.4*  CREATININE 1.91* 1.40*    Estimated Creatinine Clearance: 37.2 mL/min (by C-G formula based on SCr of 1.4 mg/dL (H)).    Allergies  Allergen Reactions  . Codeine Other (See Comments)    Causes side effects  . Diazepam Other (See Comments)  . Ibuprofen Other (See Comments)  . Penicillin G Rash  . Sulfa Antibiotics Rash    Antimicrobials this admission: 2/5 levofloxacin >>  2/6 Zosyn>>  Dose adjustments this admission:   Microbiology results: 2/5 UCx: sent  Thank you for allowing pharmacy to be a part of this patient's care.  Gardner CandleSheema M Wyndi Northrup, PharmD, BCPS Clinical Pharmacist 03/28/2016 5:13 PM

## 2016-03-28 NOTE — Progress Notes (Signed)
Urology Consult Follow Up  Subjective: Patient resting comfortably in bed.  No complaints of flank pan, stent discomfort or issues with the Foley catheter.    VSS afebrile.  Foley draining light pink clear urine.  Good UOP.  Serum creatinine down from 1.91 to 1.40.  WBC down from 20.9 to 18.4.  Urine culture is still pending.      Anti-infectives: Anti-infectives    Start     Dose/Rate Route Frequency Ordered Stop   03/27/16 1800  levofloxacin (LEVAQUIN) tablet 250 mg     250 mg Oral Daily 03/27/16 1719     03/27/16 1415  cefTRIAXone (ROCEPHIN) 1 g in dextrose 5 % 50 mL IVPB  Status:  Discontinued     1 g 100 mL/hr over 30 Minutes Intravenous  Once 03/27/16 1404 03/27/16 1407   03/27/16 1415  cefTRIAXone (ROCEPHIN) IVPB 1 g     1 g 100 mL/hr over 30 Minutes Intravenous  Once 03/27/16 1407 03/27/16 1541      Current Facility-Administered Medications  Medication Dose Route Frequency Provider Last Rate Last Dose  . 0.9 %  sodium chloride infusion   Intravenous Continuous Ramonita Lab, MD 125 mL/hr at 03/28/16 4098    . acetaminophen (TYLENOL) tablet 650 mg  650 mg Oral Q6H PRN Ramonita Lab, MD       Or  . acetaminophen (TYLENOL) suppository 650 mg  650 mg Rectal Q6H PRN Ramonita Lab, MD      . enoxaparin (LOVENOX) injection 30 mg  30 mg Subcutaneous Q24H Aruna Gouru, MD      . levofloxacin (LEVAQUIN) tablet 250 mg  250 mg Oral Daily Sheema M Hallaji, RPH   250 mg at 03/27/16 1854  . metoprolol (LOPRESSOR) injection 5 mg  5 mg Intravenous Q4H PRN Ramonita Lab, MD      . morphine 2 MG/ML injection 2 mg  2 mg Intravenous Q6H PRN Ramonita Lab, MD      . ondansetron (ZOFRAN) tablet 4 mg  4 mg Oral Q6H PRN Ramonita Lab, MD       Or  . ondansetron (ZOFRAN) injection 4 mg  4 mg Intravenous Q6H PRN Ramonita Lab, MD      . traMADol (ULTRAM) tablet 50 mg  50 mg Oral Q6H PRN Ramonita Lab, MD   50 mg at 03/28/16 0223     Objective: Vital signs in last 24 hours: Temp:  [97.1 F (36.2 C)-98.4 F  (36.9 C)] 97.5 F (36.4 C) (02/06 0752) Pulse Rate:  [59-90] 67 (02/06 0752) Resp:  [13-20] 16 (02/06 0752) BP: (85-147)/(29-121) 117/61 (02/06 0752) SpO2:  [92 %-100 %] 99 % (02/06 0752) Weight:  [140 lb (63.5 kg)] 140 lb (63.5 kg) (02/05 1708)  Intake/Output from previous day: 02/05 0701 - 02/06 0700 In: 1666 [I.V.:1456] Out: 905 [Urine:900; Blood:5] Intake/Output this shift: No intake/output data recorded.   Physical Exam Constitutional: Well nourished. Alert and oriented, No acute distress. HEENT: [place] AT, moist mucus membranes. Trachea midline, no masses. Cardiovascular: No clubbing, cyanosis, or edema. Respiratory: Normal respiratory effort, no increased work of breathing. GI: Abdomen is soft, non tender, non distended, no abdominal masses. Liver and spleen not palpable.  No hernias appreciated.  Stool sample for occult testing is not indicated.   GU: No CVA tenderness.  No bladder fullness or masses.  Foley in place.  Skin: No rashes, bruises or suspicious lesions. Lymph: No cervical or inguinal adenopathy. Neurologic: Grossly intact, no focal deficits, moving all 4 extremities. Psychiatric: Normal  mood and affect.  Lab Results:   Recent Labs  03/27/16 1100  WBC 20.9*  HGB 18.4*  HCT 53.4*  PLT 289   BMET  Recent Labs  03/27/16 1100  NA 136  K 4.0  CL 99*  CO2 28  GLUCOSE 156*  BUN 29*  CREATININE 1.91*  CALCIUM 10.0   PT/INR No results for input(s): LABPROT, INR in the last 72 hours. ABG No results for input(s): PHART, HCO3 in the last 72 hours.  Invalid input(s): PCO2, PO2  Studies/Results: Ct Abdomen Pelvis W Contrast  Result Date: 03/27/2016 CLINICAL DATA:  Left lower quadrant abdominal pain with nausea, vomiting, and fever. EXAM: CT ABDOMEN AND PELVIS WITH CONTRAST TECHNIQUE: Multidetector CT imaging of the abdomen and pelvis was performed using the standard protocol following bolus administration of intravenous contrast. CONTRAST:  75mL  ISOVUE-300 IOPAMIDOL (ISOVUE-300) INJECTION 61% COMPARISON:  CT scan dated 12/09/2014 FINDINGS: Lower chest: Borderline cardiomegaly. Minimal scarring at the left lung base. Small hiatal hernia. Hepatobiliary: No focal liver abnormality is seen. No gallstones, gallbladder wall thickening, or biliary dilatation. Pancreas: Unremarkable. No pancreatic ductal dilatation or surrounding inflammatory changes. Spleen: Normal in size without focal abnormality. Adrenals/Urinary Tract: The adrenal glands are normal. There are numerous stones in the distal left ureter with chronic left hydronephrosis as well as multiple cysts in the left kidney as well as a 17 mm cyst in the lower pole the right kidney. There is a 2 mm stone in the right ureterovesical junction with the least 5 stones in the distal left ureter including 3 in the left ureterovesical junction and an 8 mm stone in the distal left ureter proximal to the UVJ. Stomach/Bowel: Small hiatal hernia.  Otherwise normal. Vascular/Lymphatic: Extensive aortic atherosclerosis. No adenopathy. Reproductive: Enlarged prostate gland. Other: No free air or free fluid. Musculoskeletal: No acute or significant osseous findings. IMPRESSION: 1. Numerous stones in the distal left ureter creating left hydronephrosis and perinephric soft tissue stranding. 2. Tiny stone in the right ureterovesical junction with only minimal dilatation of the right ureter. 3. Small hiatal hernia. Electronically Signed   By: Francene BoyersJames  Maxwell M.D.   On: 03/27/2016 12:47     Assessment & Plan Patient is s/p cystoscopy, bilateral RTG's, bilateral ureteral stent placement and Foley catheter placement on 03/27/2016 for ARF in the setting of bilateral ureteral calculi with leukocytosis.     - serum creatinine and WBC are improving  - urine culture still pending  - may discontinue the Foley catheter at this time carefully as not to disturb the ureteral stents  - will need to plan for definitive treatment  for his stones once he has recovered from his infection in 1-2 weeks    LOS: 1 day    [facility] HospitalHANNON [facility] HospitalMCGOWAN 03/28/2016

## 2016-03-28 NOTE — Discharge Summary (Signed)
[facility] - [place] at [facility]   PATIENT NAME: William Lozano    MR#:  [id number]  DATE OF BIRTH:  1932-08-25  DATE OF ADMISSION:  03/27/2016 ADMITTING PHYSICIAN: Vanna Scotland, MD  DATE OF DISCHARGE: No discharge date for patient encounter.  PRIMARY CARE PHYSICIAN: SPARKS,JEFFREY D, MD     ADMISSION DIAGNOSIS:  Kidney stones [N20.0]  DISCHARGE DIAGNOSIS:  Principal Problem:   Hydronephrosis Active Problems:   Urolithiasis   Pyuria   SECONDARY DIAGNOSIS:   Past Medical History:  Diagnosis Date  . Arthritis   . Atrial fibrillation (HCC) 2016   paroxysmal atrial fibrillation, sick sinus syndrome  . Dementia    alzheimers  . Essential hypertension   . Hyperlipemia   . Kidney stones     .pro HOSPITAL COURSE:  The patient is 83 year old Caucasian male with medical history significant for history of Alzheimer's dementia, gastroesophageal reflux disease, paroxysmal atrial fibrillation, on ELIquis at home, who presents to the hospital with complaints of left lower quadrant abdominal pain for 3 days, nausea and vomiting. He had CT scan which revealed UVJ 8 mm stone. Urology consultation was obtained and patient was taken to operating room and had bilateral ureteral stents placed. He denies any significant discomfort today, denies fevers or chills. And no pain. He was noted to have a mild confusion intermittently, per family members, However, he frequently happens with anesthesia, according to patient's girlfriend/wife. Patient feels good today, he was felt to be stable to be discharged home. Discussion by problem:  #1. Hydronephrosis due to kidney stones, status post bilateral ureteral stents placement , 03/27/2016 by Dr. Apolinar Junes, follow-up with the urology as outpatient, creatinine has been improving, patient was advised to drink plenty of fluids to facilitate urinary flow. Foley catheter was discontinued prior to discharge.  #2. Acute on chronic renal  failure, improving with therapy, continue  good oral fluid intake, check creatinine as outpatient #3. Confusion, frequent with hospitalization, per patient's significant other, supportive therapy was recommended only  #4. Pyuria, questionable urinary tract infection, patient is to continue antibiotic levofloxacin while awaiting for urinary cultures , patient was afebrile, follow-up with urology and adjust antibiotic therapy if needed per culture results #5. Leukocytosis, improved with therapy, patient is afebrile, follow white blood cell count as outpatient  DISCHARGE CONDITIONS:   Stable CONSULTS OBTAINED:    DRUG ALLERGIES:   Allergies  Allergen Reactions  . Codeine Other (See Comments)    Causes side effects  . Diazepam Other (See Comments)  . Ibuprofen Other (See Comments)  . Penicillin G Rash  . Sulfa Antibiotics Rash    DISCHARGE MEDICATIONS:   Current Discharge Medication List    START taking these medications   Details  levofloxacin (LEVAQUIN) 250 MG tablet Take 1 tablet (250 mg total) by mouth daily. Qty: 7 tablet, Refills: 0    traMADol (ULTRAM) 50 MG tablet Take 1 tablet (50 mg total) by mouth every 6 (six) hours as needed for moderate pain. Qty: 30 tablet, Refills: 0      CONTINUE these medications which have NOT CHANGED   Details  apixaban (ELIQUIS) 5 MG TABS tablet Take 5 mg by mouth 2 (two) times daily.    metoprolol succinate (TOPROL-XL) 25 MG 24 hr tablet Take 25 mg by mouth daily.    pantoprazole (PROTONIX) 40 MG tablet Take 40 mg by mouth daily.    rivastigmine (EXELON) 1.5 MG capsule Take 1.5 mg by mouth 2 (two) times daily.  simvastatin (ZOCOR) 20 MG tablet Take 20 mg by mouth daily.    silodosin (RAPAFLO) 8 MG CAPS capsule Take 1 capsule (8 mg total) by mouth daily with breakfast. Qty: 30 capsule, Refills: 11      STOP taking these medications     hydrochlorothiazide (HYDRODIURIL) 25 MG tablet          DISCHARGE INSTRUCTIONS:     The patient is to follow-up with primary care physician, urologist as outpatientIf you experience worsening of your admission symptoms, develop shortness of breath, life threatening emergency, suicidal or homicidal thoughts you must seek medical attention immediately by calling 911 or calling your MD immediately  if symptoms less severe.  You Must read complete instructions/literature along with all the possible adverse reactions/side effects for all the Medicines you take and that have been prescribed to you. Take any new Medicines after you have completely understood and accept all the possible adverse reactions/side effects.   Please note  You were cared for by a hospitalist during your hospital stay. If you have any questions about your discharge medications or the care you received while you were in the hospital after you are discharged, you can call the unit and asked to speak with the hospitalist on call if the hospitalist that took care of you is not available. Once you are discharged, your primary care physician will handle any further medical issues. Please note that NO REFILLS for any discharge medications will be authorized once you are discharged, as it is imperative that you return to your primary care physician (or establish a relationship with a primary care physician if you do not have one) for your aftercare needs so that they can reassess your need for medications and monitor your lab values.    Today   CHIEF COMPLAINT:   Chief Complaint  Patient presents with  . Abdominal Pain    left lower pelvic    HISTORY OF PRESENT ILLNESS:  William Lozano  is a 83 y.o. male with a known history of Alzheimer's dementia, gastroesophageal reflux disease, paroxysmal atrial fibrillation, on ELIquis at home, who presents to the hospital with complaints of left lower quadrant abdominal pain for 3 days, nausea and vomiting. He had CT scan which revealed UVJ 8 mm stone. Urology consultation  was obtained and patient was taken to operating room and had bilateral ureteral stents placed. He denies any significant discomfort today, denies fevers or chills. And no pain. He was noted to have a mild confusion intermittently, per family members, However, he frequently happens with anesthesia, according to patient's girlfriend/wife. Patient feels good today, he was felt to be stable to be discharged home. Discussion by problem:  #1. Hydronephrosis due to kidney stones, status post bilateral ureteral stents placement , 03/27/2016 by Dr. Apolinar Junes, follow-up with the urology as outpatient, creatinine has been improving, patient was advised to drink plenty of fluids to facilitate urinary flow. Foley catheter was discontinued prior to discharge.  #2. Acute on chronic renal failure, improving with therapy, continue  good oral fluid intake, check creatinine as outpatient #3. Confusion, frequent with hospitalization, per patient's significant other, supportive therapy was recommended only  #4. Pyuria, questionable urinary tract infection, patient is to continue antibiotic levofloxacin while awaiting for urinary cultures , patient was afebrile, follow-up with urology and adjust antibiotic therapy if needed per culture results #5. Leukocytosis, improved with therapy, patient is afebrile, follow white blood cell count as outpatient    VITAL SIGNS:  Blood pressure 117/61, pulse  67, temperature 97.5 F (36.4 C), temperature source Oral, resp. rate 16, height 5\' 7"  (1.702 m), weight 63.5 kg (140 lb), SpO2 99 %.  I/O:   Intake/Output Summary (Last 24 hours) at 03/28/16 1750 Last data filed at 03/28/16 1434  Gross per 24 hour  Intake             1096 ml  Output             2000 ml  Net             -904 ml    PHYSICAL EXAMINATION:  GENERAL:  83 y.o.-year-old patient lying in the bed with no acute distress.  EYES: Pupils equal, round, reactive to light and accommodation. No scleral icterus. Extraocular  muscles intact.  HEENT: Head atraumatic, normocephalic. Oropharynx and nasopharynx clear.  NECK:  Supple, no jugular venous distention. No thyroid enlargement, no tenderness.  LUNGS: Normal breath sounds bilaterally, no wheezing, rales,rhonchi or crepitation. No use of accessory muscles of respiration.  CARDIOVASCULAR: S1, S2 normal. No murmurs, rubs, or gallops.  ABDOMEN: Soft, non-tender, non-distended. Bowel sounds present. No organomegaly or mass.  EXTREMITIES: No pedal edema, cyanosis, or clubbing.  NEUROLOGIC: Cranial nerves II through XII are intact. Muscle strength 5/5 in all extremities. Sensation intact. Gait not checked.  PSYCHIATRIC: The patient is alert and oriented x 3.  SKIN: No obvious rash, lesion, or ulcer.   DATA REVIEW:   CBC  Recent Labs Lab 03/28/16 0829  WBC 18.4*  HGB 15.2  HCT 44.1  PLT 234    Chemistries   Recent Labs Lab 03/28/16 0829  NA 138  K 4.0  CL 106  CO2 25  GLUCOSE 142*  BUN 25*  CREATININE 1.40*  CALCIUM 8.6*  AST 23  ALT 12*  ALKPHOS 56  BILITOT 1.4*    Cardiac Enzymes No results for input(s): TROPONINI in the last 168 hours.  Microbiology Results  Results for orders placed or performed in visit on 07/22/15  Microscopic Examination     Status: Abnormal   Collection Time: 07/22/15  9:40 AM  Result Value Ref Range Status   WBC, UA 0-5 0 - 5 /hpf Final   RBC, UA 0-2 0 - 2 /hpf Final   Epithelial Cells (non renal) None seen 0 - 10 /hpf Final   Mucus, UA Present (A) Not Estab. Final   Bacteria, UA None seen None seen/Few Final    RADIOLOGY:  Ct Abdomen Pelvis W Contrast  Result Date: 03/27/2016 CLINICAL DATA:  Left lower quadrant abdominal pain with nausea, vomiting, and fever. EXAM: CT ABDOMEN AND PELVIS WITH CONTRAST TECHNIQUE: Multidetector CT imaging of the abdomen and pelvis was performed using the standard protocol following bolus administration of intravenous contrast. CONTRAST:  75mL ISOVUE-300 IOPAMIDOL (ISOVUE-300)  INJECTION 61% COMPARISON:  CT scan dated 12/09/2014 FINDINGS: Lower chest: Borderline cardiomegaly. Minimal scarring at the left lung base. Small hiatal hernia. Hepatobiliary: No focal liver abnormality is seen. No gallstones, gallbladder wall thickening, or biliary dilatation. Pancreas: Unremarkable. No pancreatic ductal dilatation or surrounding inflammatory changes. Spleen: Normal in size without focal abnormality. Adrenals/Urinary Tract: The adrenal glands are normal. There are numerous stones in the distal left ureter with chronic left hydronephrosis as well as multiple cysts in the left kidney as well as a 17 mm cyst in the lower pole the right kidney. There is a 2 mm stone in the right ureterovesical junction with the least 5 stones in the distal left ureter including 3 in the  left ureterovesical junction and an 8 mm stone in the distal left ureter proximal to the UVJ. Stomach/Bowel: Small hiatal hernia.  Otherwise normal. Vascular/Lymphatic: Extensive aortic atherosclerosis. No adenopathy. Reproductive: Enlarged prostate gland. Other: No free air or free fluid. Musculoskeletal: No acute or significant osseous findings. IMPRESSION: 1. Numerous stones in the distal left ureter creating left hydronephrosis and perinephric soft tissue stranding. 2. Tiny stone in the right ureterovesical junction with only minimal dilatation of the right ureter. 3. Small hiatal hernia. Electronically Signed   By: Francene BoyersJames  Maxwell M.D.   On: 03/27/2016 12:47    EKG:   Orders placed or performed during the hospital encounter of 02/24/15  . EKG 12-Lead  . EKG 12-Lead      Management plans discussed with the patient, family and they are in agreement.  CODE STATUS:     Code Status Orders        Start     Ordered   03/27/16 1710  Full code  Continuous     03/27/16 1709    Code Status History    Date Active Date Inactive Code Status Order ID Comments User Context   02/24/2015  3:23 PM 02/25/2015  5:43 PM Full Code  [id number]  Katharina Caperima Sona Nations, MD Inpatient   12/10/2014  6:46 AM 12/14/2014  2:16 PM Full Code [id number]  Arnaldo NatalMichael S Diamond, MD Inpatient    Advance Directive Documentation   Flowsheet Row Most Recent Value  Type of Advance Directive  Healthcare Power of [place] Living will [name][Shyquan B Cressman, JR]  Pre-existing out of facility DNR order (yellow form or pink MOST form)  No data  "MOST" Form in Place?  No data      TOTAL TIME TAKING CARE OF THIS PATIENT: 40 minutes.    Katharina CaperVAICKUTE,Kaidin Boehle M.D on 03/28/2016 at 5:50 PM  Between 7am to 6pm - Pager - [phone]  After 6pm go to [email or web address] - password [email or web address] [email or web address]  [place]  Office  [phone]  CC: Primary care physician; Marguarite ArbourSPARKS,JEFFREY D, MD

## 2016-03-28 NOTE — Progress Notes (Signed)
[facility] - [place] at [facility]   PATIENT NAME: William Lozano    MR#:  [id number]  DATE OF BIRTH:  [birth year]  SUBJECTIVE:  CHIEF COMPLAINT:   Chief Complaint  Patient presents with  . Abdominal Pain    left lower pelvic   The patient is 83 year old Caucasian male with medical history significant for history of Alzheimer's dementia, gastroesophageal reflux disease, paroxysmal atrial fibrillation, on ELIquis at home, who presents to the hospital with complaints of left lower quadrant abdominal pain for 3 days, nausea and vomiting. He had CT scan which revealed UVJ 8 mm stone. Urology consultation was obtained and patient was taken to operating room and had bilateral ureteral stents placed. He denies any significant discomfort today, denies fevers or chills. And no pain. He was noted to have a mild confusion intermittently, per family members Review of Systems  Constitutional: Negative for chills, fever and weight loss.  HENT: Negative for congestion.   Eyes: Negative for blurred vision and double vision.  Respiratory: Negative for cough, sputum production, shortness of breath and wheezing.   Cardiovascular: Negative for chest pain, palpitations, orthopnea, leg swelling and PND.  Gastrointestinal: Negative for abdominal pain, blood in stool, constipation, diarrhea, nausea and vomiting.  Genitourinary: Negative for dysuria, frequency, hematuria and urgency.  Musculoskeletal: Negative for falls.  Neurological: Negative for dizziness, tremors, focal weakness and headaches.  Endo/Heme/Allergies: Does not bruise/bleed easily.  Psychiatric/Behavioral: Negative for depression. The patient does not have insomnia.     VITAL SIGNS: Blood pressure 117/61, pulse 67, temperature 97.5 F (36.4 C), temperature source Oral, resp. rate 16, height 5\' 7"  (1.702 m), weight 63.5 kg (140 lb), SpO2 99 %.  PHYSICAL EXAMINATION:   GENERAL:  83 y.o.-year-old patient lying in the bed  with no acute distress.  EYES: Pupils equal, round, reactive to light and accommodation. No scleral icterus. Extraocular muscles intact.  HEENT: Head atraumatic, normocephalic. Oropharynx and nasopharynx clear.  NECK:  Supple, no jugular venous distention. No thyroid enlargement, no tenderness.  LUNGS: Normal breath sounds bilaterally, no wheezing, rales,rhonchi or crepitation. No use of accessory muscles of respiration.  CARDIOVASCULAR: S1, S2 normal. No murmurs, rubs, or gallops.  ABDOMEN: Soft, nontender, nondistended. Bowel sounds present. No organomegaly or mass.  EXTREMITIES: No pedal edema, cyanosis, or clubbing.  NEUROLOGIC: Cranial nerves II through XII are intact. Muscle strength 5/5 in all extremities. Sensation intact. Gait not checked.  PSYCHIATRIC: The patient is alert and oriented x 3. Able to answer all questions correctly. I do not notice no confusion during my evaluation earlier today SKIN: No obvious rash, lesion, or ulcer.   ORDERS/RESULTS REVIEWED:   CBC  Recent Labs Lab 03/27/16 1100 03/28/16 0829  WBC 20.9* 18.4*  HGB 18.4* 15.2  HCT 53.4* 44.1  PLT 289 234  MCV 90.6 90.8  MCH 31.1 31.2  MCHC 34.4 34.4  RDW 14.4 14.2  LYMPHSABS 0.9*  --   MONOABS 1.8*  --   EOSABS 0.1  --   BASOSABS 0.1  --    ------------------------------------------------------------------------------------------------------------------  Chemistries   Recent Labs Lab 03/27/16 1100 03/28/16 0829  NA 136 138  K 4.0 4.0  CL 99* 106  CO2 28 25  GLUCOSE 156* 142*  BUN 29* 25*  CREATININE 1.91* 1.40*  CALCIUM 10.0 8.6*  AST 31 23  ALT 14* 12*  ALKPHOS 73 56  BILITOT 1.9* 1.4*   ------------------------------------------------------------------------------------------------------------------ estimated creatinine clearance is 37.2 mL/min (by C-G formula based on SCr of 1.4 mg/dL  (  H)). ------------------------------------------------------------------------------------------------------------------ No results for input(s): TSH, T4TOTAL, T3FREE, THYROIDAB in the last 72 hours.  Invalid input(s): FREET3  Cardiac Enzymes No results for input(s): CKMB, TROPONINI, MYOGLOBIN in the last 168 hours.  Invalid input(s): CK ------------------------------------------------------------------------------------------------------------------ Invalid input(s): POCBNP ---------------------------------------------------------------------------------------------------------------  RADIOLOGY: Ct Abdomen Pelvis W Contrast  Result Date: 03/27/2016 CLINICAL DATA:  Left lower quadrant abdominal pain with nausea, vomiting, and fever. EXAM: CT ABDOMEN AND PELVIS WITH CONTRAST TECHNIQUE: Multidetector CT imaging of the abdomen and pelvis was performed using the standard protocol following bolus administration of intravenous contrast. CONTRAST:  75mL ISOVUE-300 IOPAMIDOL (ISOVUE-300) INJECTION 61% COMPARISON:  CT scan dated 12/09/2014 FINDINGS: Lower chest: Borderline cardiomegaly. Minimal scarring at the left lung base. Small hiatal hernia. Hepatobiliary: No focal liver abnormality is seen. No gallstones, gallbladder wall thickening, or biliary dilatation. Pancreas: Unremarkable. No pancreatic ductal dilatation or surrounding inflammatory changes. Spleen: Normal in size without focal abnormality. Adrenals/Urinary Tract: The adrenal glands are normal. There are numerous stones in the distal left ureter with chronic left hydronephrosis as well as multiple cysts in the left kidney as well as a 17 mm cyst in the lower pole the right kidney. There is a 2 mm stone in the right ureterovesical junction with the least 5 stones in the distal left ureter including 3 in the left ureterovesical junction and an 8 mm stone in the distal left ureter proximal to the UVJ. Stomach/Bowel: Small hiatal hernia.  Otherwise  normal. Vascular/Lymphatic: Extensive aortic atherosclerosis. No adenopathy. Reproductive: Enlarged prostate gland. Other: No free air or free fluid. Musculoskeletal: No acute or significant osseous findings. IMPRESSION: 1. Numerous stones in the distal left ureter creating left hydronephrosis and perinephric soft tissue stranding. 2. Tiny stone in the right ureterovesical junction with only minimal dilatation of the right ureter. 3. Small hiatal hernia. Electronically Signed   By: Francene BoyersJames  Maxwell M.D.   On: 03/27/2016 12:47    EKG:  Orders placed or performed during the hospital encounter of 02/24/15  . EKG 12-Lead  . EKG 12-Lead    ASSESSMENT AND PLAN:  Principal Problem:   Hydronephrosis Active Problems:   Urolithiasis   Pyuria  #1 urolithiasis, status post ureteral stent placement, continue pain management, supportive therapy, IV fluids, will need to follow up with urologist as outpatient #2. Hydronephrosis due to kidney stones, status post bilateral ureteral stents placement, follow-up with the urology as outpatient, creatinine has been improving #3. Acute on chronic renal failure, improving with therapy, continue IV fluids or good oral fluid intake, if patient is discharged home #4. Confusion, questionable due to Alzheimer's dementia, versus delirium due to medications, hospitalization, the patient's nurse will discuss with patient's family if confusion is something different from patient's normal functioning versus chronic condition #5. Pyuria, questionable urinary tract infection, patient is to continue antibiotic therapy, urinary cultures are pending, patient is afebrile, change antibiotic to Zosyn if patient is staying in the hospital.  #6. Leukocytosis, improving with therapy  Management plans discussed with the patient, family and they are in agreement.   DRUG ALLERGIES:  Allergies  Allergen Reactions  . Codeine Other (See Comments)    Causes side effects  . Diazepam Other  (See Comments)  . Ibuprofen Other (See Comments)  . Penicillin G Rash  . Sulfa Antibiotics Rash    CODE STATUS:     Code Status Orders        Start     Ordered   03/27/16 1710  Full code  Continuous     03/27/16  1709    Code Status History    Date Active Date Inactive Code Status Order ID Comments User Context   02/24/2015  3:23 PM 02/25/2015  5:43 PM Full Code [id number]  Katharina Caper, MD Inpatient   12/10/2014  6:46 AM 12/14/2014  2:16 PM Full Code [id number]  Arnaldo Natal, MD Inpatient    Advance Directive Documentation   Flowsheet Row Most Recent Value  Type of Advance Directive  Healthcare Power of [place], Living will [name], JR]  Pre-existing out of facility DNR order (yellow form or pink MOST form)  No data  "MOST" Form in Place?  No data      TOTAL TIME TAKING CARE OF THIS PATIENT: 40 minutes.    Katharina Caper M.D on 03/28/2016 at 5:04 PM  Between 7am to 6pm - Pager - [phone]  After 6pm go to [email or web address] - password [email or web address] [facility]  [place]  Office  [phone]  CC: Primary care physician; Marguarite Arbour, MD

## 2016-03-29 ENCOUNTER — Other Ambulatory Visit: Payer: Self-pay | Admitting: Radiology

## 2016-03-29 ENCOUNTER — Encounter: Payer: Self-pay | Admitting: Urology

## 2016-03-29 DIAGNOSIS — N201 Calculus of ureter: Secondary | ICD-10-CM

## 2016-03-29 LAB — URINE CULTURE: CULTURE: NO GROWTH

## 2016-04-06 ENCOUNTER — Encounter: Payer: Self-pay | Admitting: Urology

## 2016-04-06 ENCOUNTER — Other Ambulatory Visit: Payer: Self-pay | Admitting: Radiology

## 2016-04-06 ENCOUNTER — Ambulatory Visit (INDEPENDENT_AMBULATORY_CARE_PROVIDER_SITE_OTHER): Payer: Medicare Other | Admitting: Urology

## 2016-04-06 ENCOUNTER — Encounter
Admission: RE | Admit: 2016-04-06 | Discharge: 2016-04-06 | Disposition: A | Discharge status: Home / Self Care | Payer: Medicare Other | Source: Ambulatory Visit | Attending: Urology | Admitting: Urology

## 2016-04-06 VITALS — BP 132/94 | HR 97 | Ht 66.0 in | Wt 156.0 lb

## 2016-04-06 DIAGNOSIS — N201 Calculus of ureter: Secondary | ICD-10-CM

## 2016-04-06 DIAGNOSIS — N200 Calculus of kidney: Secondary | ICD-10-CM | POA: Diagnosis not present

## 2016-04-06 HISTORY — DX: Bilateral inguinal hernia, without obstruction or gangrene, recurrent: K40.21

## 2016-04-06 HISTORY — DX: Personal history of urinary calculi: Z87.442

## 2016-04-06 HISTORY — DX: Gastro-esophageal reflux disease without esophagitis: K21.9

## 2016-04-06 HISTORY — DX: Other complications of anesthesia, initial encounter: T88.59XA

## 2016-04-06 HISTORY — DX: Adverse effect of unspecified anesthetic, initial encounter: T41.45XA

## 2016-04-06 LAB — URINALYSIS, COMPLETE
Bilirubin, UA: NEGATIVE
GLUCOSE, UA: NEGATIVE
Ketones, UA: NEGATIVE
Nitrite, UA: NEGATIVE
Specific Gravity, UA: 1.015 (ref 1.005–1.030)
UUROB: 0.2 mg/dL (ref 0.2–1.0)
pH, UA: 5 (ref 5.0–7.5)

## 2016-04-06 LAB — MICROSCOPIC EXAMINATION
Bacteria, UA: NONE SEEN
RBC, UA: >30 /hpf — AB (ref 0–?)

## 2016-04-06 NOTE — Progress Notes (Signed)
 04/06/2016 10:40 AM   William B Beauregard Sr. 03/28/1932 [id number]  Referring provider: Jeffrey D Sparks, MD [address] [facility] [place], [place] [address]  No chief complaint on file.   HPI: 83 yo M with Alzheimer's dementia recent admission for bilateral ureteral stent placement on 03/27/16 in the setting of 8 mm left UVJ with second smaller stone and much smaller rigtht UJV stone.  He initially presented with left flank pain, nausea, and vomiting which was poorly controlled.  Urine culture was ultimately negative. He has been taking antibiotics (Levaquin) prescribed at the time of his stent placement.  Today, he denies any fevers, chills, severe pain. He does have some bladder symptoms related to his stents but otherwise unremarkable.  He returns to the office today to discuss definitive management of his stones.      He does have a history of urosepsis from obstructing stones in the past.  He has had shockwave lithotripsy unsuccessfully in the past requiring follow-up ureteroscopy. His most recent procedure was 1 year prior.  Previous stone analysis consistent with 95% calcium oxalate monohydrate, 5% calcium phosphate.  He and his family have some concerns about cognitive decline with multiple anesthetics given over the past 12 months.  He is on Elliquis for A. fib.  PMH: Past Medical History:  Diagnosis Date  . Arthritis   . Atrial fibrillation (HCC) 2016   paroxysmal atrial fibrillation, sick sinus syndrome  . Dementia    alzheimers  . Essential hypertension   . Hyperlipemia   . Kidney stones     Surgical History: Past Surgical History:  Procedure Laterality Date  . CYSTOSCOPY W/ RETROGRADES Bilateral 03/27/2016   Procedure: CYSTOSCOPY WITH RETROGRADE PYELOGRAM;  Surgeon: Kortlyn Koltz, MD;  Location: [facility];  Service: Urology;  Laterality: Bilateral;  . CYSTOSCOPY W/ URETERAL STENT PLACEMENT Bilateral 02/24/2015   Procedure: CYSTOSCOPY WITH STENT  REPLACEMENT;  Surgeon: Brian Lindon Budzyn, MD;  Location: [facility];  Service: Urology;  Laterality: Bilateral;  . CYSTOSCOPY WITH RETROGRADE PYELOGRAM, URETEROSCOPY AND STENT PLACEMENT Bilateral 12/10/2014   Procedure: CYSTOSCOPY WITH RETROGRADE PYELOGRAM, URETEROSCOPY AND STENT PLACEMENT;  Surgeon: Brian Chancelor Budzyn, MD;  Location: [facility];  Service: Urology;  Laterality: Bilateral;  . CYSTOSCOPY WITH STENT PLACEMENT Bilateral 12/10/2014   Procedure: CYSTOSCOPY WITH STENT PLACEMENT;  Surgeon: Brian Orhan Budzyn, MD;  Location: [facility];  Service: Urology;  Laterality: Bilateral;  . CYSTOSCOPY WITH STENT PLACEMENT Bilateral 03/27/2016   Procedure: CYSTOSCOPY WITH STENT PLACEMENT;  Surgeon: Kathrynn Backstrom, MD;  Location: [facility];  Service: Urology;  Laterality: Bilateral;  . EXTRACORPOREAL SHOCK WAVE LITHOTRIPSY Right 01/07/2015   Procedure: EXTRACORPOREAL SHOCK WAVE LITHOTRIPSY (ESWL);  Surgeon: Richard D Hart, MD;  Location: [facility];  Service: Urology;  Laterality: Right;  . EXTRACORPOREAL SHOCK WAVE LITHOTRIPSY Left 01/21/2015   Procedure: EXTRACORPOREAL SHOCK WAVE LITHOTRIPSY (ESWL);  Surgeon: Brian Chung Budzyn, MD;  Location: [facility];  Service: Urology;  Laterality: Left;  . EYE SURGERY Bilateral    Cataract Extraction with IOL  . HERNIA REPAIR Bilateral    inguinal  . URETEROSCOPY WITH HOLMIUM LASER LITHOTRIPSY Bilateral 02/24/2015   Procedure: URETEROSCOPY WITH HOLMIUM LASER LITHOTRIPSY /STONE BASKETING;  Surgeon: Brian Ngoc Budzyn, MD;  Location: [facility];  Service: Urology;  Laterality: Bilateral;    Home Medications:  Allergies as of 04/06/2016      Reactions   Codeine Other (See Comments)   Causes side effects   Diazepam Other (See Comments)   Ibuprofen Other (  See Comments)   Penicillin G Rash   Sulfa Antibiotics Rash      Medication List       Accurate as of 04/06/16 10:40 AM. Always use your most recent med list.          apixaban 5 MG Tabs tablet Commonly known as:   ELIQUIS Take 5 mg by mouth 2 (two) times daily.   levofloxacin 250 MG tablet Commonly known as:  LEVAQUIN Take 1 tablet (250 mg total) by mouth daily.   metoprolol succinate 25 MG 24 hr tablet Commonly known as:  TOPROL-XL Take 25 mg by mouth daily.   pantoprazole 40 MG tablet Commonly known as:  PROTONIX Take 40 mg by mouth daily.   rivastigmine 1.5 MG capsule Commonly known as:  EXELON Take 1.5 mg by mouth 2 (two) times daily.   silodosin 8 MG Caps capsule Commonly known as:  RAPAFLO Take 1 capsule (8 mg total) by mouth daily with breakfast.   simvastatin 20 MG tablet Commonly known as:  ZOCOR Take 20 mg by mouth daily.   traMADol 50 MG tablet Commonly known as:  ULTRAM Take 1 tablet (50 mg total) by mouth every 6 (six) hours as needed for moderate pain.       Allergies:  Allergies  Allergen Reactions  . Codeine Other (See Comments)    Causes side effects  . Diazepam Other (See Comments)  . Ibuprofen Other (See Comments)  . Penicillin G Rash  . Sulfa Antibiotics Rash    Family History: Family History  Problem Relation Age of Onset  . Hypertension    . Kidney disease Neg Hx   . Prostate cancer Neg Hx     Social History:  reports that he has never smoked. He has never used smokeless tobacco. He reports that he does not drink alcohol or use drugs.  ROS: UROLOGY Frequent Urination?: No Hard to postpone urination?: No Burning/pain with urination?: No Get up at night to urinate?: No Leakage of urine?: No Urine stream starts and stops?: No Trouble starting stream?: No Do you have to strain to urinate?: No Blood in urine?: No Urinary tract infection?: No Sexually transmitted disease?: No Injury to kidneys or bladder?: No Painful intercourse?: No Weak stream?: No Erection problems?: No Penile pain?: No  Gastrointestinal Nausea?: No Vomiting?: No Indigestion/heartburn?: No Diarrhea?: No Constipation?: No  Constitutional Fever: No Night  sweats?: No Weight loss?: No Fatigue?: No  Skin Skin rash/lesions?: No Itching?: No  Eyes Blurred vision?: No Double vision?: No  Ears/Nose/Throat Sore throat?: No Sinus problems?: No  Hematologic/Lymphatic Swollen glands?: No Easy bruising?: No  Cardiovascular Leg swelling?: No Chest pain?: No  Respiratory Cough?: No Shortness of breath?: No  Endocrine Excessive thirst?: No  Musculoskeletal Back pain?: No Joint pain?: No  Neurological Headaches?: No Dizziness?: No  Psychologic Depression?: No Anxiety?: No  Physical Exam: BP (!) 132/94   Pulse 97   Ht 5' 6" (1.676 m)   Wt 156 lb (70.8 kg)   BMI 25.18 kg/m   Constitutional:  Alert and oriented, No acute distress.  He is accompanied today by 2 family members. HEENT: [place] AT, moist mucus membranes.  Trachea midline, no masses. Cardiovascular: No clubbing, cyanosis, or edema. Respiratory: Normal respiratory effort, no increased work of breathing. GI: Abdomen is soft, nontender, nondistended, no abdominal masses GU: No CVA tenderness.  Skin: No rashes, bruises or suspicious lesions. Neurologic: Grossly intact, no focal deficits, moving all 4 extremities. Psychiatric: Normal mood and affect.    Laboratory Data: Lab Results  Component Value Date   WBC 18.4 (H) 03/28/2016   HGB 15.2 03/28/2016   HCT 44.1 03/28/2016   MCV 90.8 03/28/2016   PLT 234 03/28/2016    Lab Results  Component Value Date   CREATININE 1.40 (H) 03/28/2016     Lab Results  Component Value Date   HGBA1C 6.7 (H) 02/24/2015    Urinalysis UA/ UCx from today pendin  Pertinent Imaging: Study Result   CLINICAL DATA:  Left lower quadrant abdominal pain with nausea, vomiting, and fever.  EXAM: CT ABDOMEN AND PELVIS WITH CONTRAST  TECHNIQUE: Multidetector CT imaging of the abdomen and pelvis was performed using the standard protocol following bolus administration of intravenous contrast.  CONTRAST:  75mL ISOVUE-300  IOPAMIDOL (ISOVUE-300) INJECTION 61%  COMPARISON:  CT scan dated 12/09/2014  FINDINGS: Lower chest: Borderline cardiomegaly. Minimal scarring at the left lung base. Small hiatal hernia.  Hepatobiliary: No focal liver abnormality is seen. No gallstones, gallbladder wall thickening, or biliary dilatation.  Pancreas: Unremarkable. No pancreatic ductal dilatation or surrounding inflammatory changes.  Spleen: Normal in size without focal abnormality.  Adrenals/Urinary Tract: The adrenal glands are normal. There are numerous stones in the distal left ureter with chronic left hydronephrosis as well as multiple cysts in the left kidney as well as a 17 mm cyst in the lower pole the right kidney. There is a 2 mm stone in the right ureterovesical junction with the least 5 stones in the distal left ureter including 3 in the left ureterovesical junction and an 8 mm stone in the distal left ureter proximal to the UVJ.  Stomach/Bowel: Small hiatal hernia.  Otherwise normal.  Vascular/Lymphatic: Extensive aortic atherosclerosis. No adenopathy.  Reproductive: Enlarged prostate gland.  Other: No free air or free fluid.  Musculoskeletal: No acute or significant osseous findings.  IMPRESSION: 1. Numerous stones in the distal left ureter creating left hydronephrosis and perinephric soft tissue stranding. 2. Tiny stone in the right ureterovesical junction with only minimal dilatation of the right ureter. 3. Small hiatal hernia.   Electronically Signed   By: Adonys  Maxwell M.D.   On: 03/27/2016 12:47   CT reviewed again today  Assessment & Plan:    1. Bilateral ureteral calculi S/p urgent bilateral ureteral stent placement who returns today to discuss definitive management of his stone  Risks and benefits of ureteroscopy were reviewed including but not limited to infection, bleeding, pain, ureteral injury which could require open surgery versus prolonged indwelling if  ureteralperforation occurs, persistent stone disease, requirement for staged procedure, possible stent, and global anesthesia risks. Patient expressed understanding and desires to proceed with ureteroscopy.  In terms of his concerns about general anesthesia, given the location of the stones, it would be reasonable to attempt this under spinal anesthesia. I advised him to discuss this further with my anesthesia colleagues prior to surgery.  Agree with holding Elliquis risks 2 days before the procedure per cardiology  2. Recurrent nephrolithiasis Given her recurrent nephrolithiasis, recommend 24-hour urine metabolic workup following treatment of the stones  - Urinalysis, Complete  Scheduled bilateral ureteroscopy, laser lithotripsy, bilateral ureteral stent exchange  Camrynn Mcclintic, MD  [facility] [address], [address] , [place] [address] [phone]  

## 2016-04-06 NOTE — Patient Instructions (Signed)
  Your procedure is scheduled on: Tuesday, April 11, 2016 Report to Same Day Surgery 2nd floor medical mall (Medical Mall Entrance-take elevator on left to 2nd floor.  Check in with surgery information desk.) To find out your arrival time please call [phone] between 1PM - 3PM on Monday, February 19th  Remember: Instructions that are not followed completely may result in serious medical risk, up to and including death, or upon the discretion of your surgeon and anesthesiologist your surgery may need to be rescheduled.    _x___ 1. Do not eat food or drink liquids after midnight. No gum chewing or hard candies.     __x__ 2. No Alcohol for 24 hours before or after surgery.   __x__3. No Smoking for 24 prior to surgery.   ____  4. Bring all medications with you on the day of surgery if instructed.    __x__ 5. Notify your doctor if there is any change in your medical condition     (cold, fever, infections).     Do not wear jewelry, make-up, hairpins, clips or nail polish.  Do not wear lotions, powders, or perfumes. You may wear deodorant.  Do not shave 48 hours prior to surgery. Men may shave face and neck.  Do not bring valuables to the hospital.    [facility] is [facility] for any belongings or valuables.               Contacts, dentures or bridgework may not be worn into surgery.  Leave your suitcase in the car. After surgery it may be brought to your room.  For patients admitted to the hospital, discharge time is determined by your treatment team.   Patients discharged the day of surgery will not be allowed to drive home.  You will need someone to drive you home and stay with you the night of your procedure.    Please read over the following fact sheets that you were given:   [facility] Preparing for Surgery and or MRSA Information   _x___ Take these medicines the morning of surgery with A SIP OF WATER:    1. METOPROLOL  2. PROTONIX (TAKE ONE TABLET THE NIGHT BEFORE  SURGERY AND ONE TABLET THE MORNING OF SURGERY)  3. EXELON  4.  5.  6.  ____Fleets enema or Magnesium Citrate as directed.   _x___ Use CHG Soap or sage wipes as directed on instruction sheet   ____ Use inhalers on the day of surgery and bring to hospital day of surgery  ____ Stop metformin 2 days prior to surgery    ____ Take 1/2 of usual insulin dose the night before surgery and none on the morning of           surgery.   __X__ Stop Aspirin, Coumadin, Pllavix ,Eliquis, Effient, or Pradaxa; LAST DOSE OF ELIQUIS ON Saturday, February 17th - DO NOT TAKE ELIQUIS ON Sunday, Monday, OR Tuesday   x__ Stop Anti-inflammatories such as Advil, Aleve, Ibuprofen, Motrin, Naproxen,          Naprosyn, Goodies powders or aspirin products. Ok to take Tylenol.   ____ Stop supplements until after surgery.    ____ Bring C-Pap to the hospital.

## 2016-04-06 NOTE — Addendum Note (Signed)
Addended by: Rupert StacksWATKINS, Synthia Fairbank C on: 04/06/2016 11:11 AM   Modules accepted: Orders

## 2016-04-09 LAB — CULTURE, URINE COMPREHENSIVE

## 2016-04-10 MED ORDER — CIPROFLOXACIN IN D5W 400 MG/200ML IV SOLN
400.0000 mg | INTRAVENOUS | Status: AC
  Administered 2016-04-11: 400 mg via INTRAVENOUS

## 2016-04-10 MED ORDER — GENTAMICIN IN SALINE 1.6-0.9 MG/ML-% IV SOLN
80.0000 mg | INTRAVENOUS | Status: AC
  Administered 2016-04-11: 80 mg via INTRAVENOUS
  Filled 2016-04-10: qty 50

## 2016-04-11 ENCOUNTER — Ambulatory Visit
Admission: RE | Admit: 2016-04-11 | Discharge: 2016-04-11 | Disposition: A | Discharge status: Home / Self Care | Payer: Medicare Other | Source: Ambulatory Visit | Attending: Urology | Admitting: Urology

## 2016-04-11 ENCOUNTER — Encounter: Payer: Self-pay | Admitting: *Deleted

## 2016-04-11 ENCOUNTER — Encounter
Admission: RE | Disposition: A | Discharge status: Home / Self Care | Payer: Self-pay | Source: Ambulatory Visit | Attending: Urology

## 2016-04-11 ENCOUNTER — Ambulatory Visit: Payer: Medicare Other | Admitting: Certified Registered"

## 2016-04-11 DIAGNOSIS — E785 Hyperlipidemia, unspecified: Secondary | ICD-10-CM | POA: Insufficient documentation

## 2016-04-11 DIAGNOSIS — F0280 Dementia in other diseases classified elsewhere without behavioral disturbance: Secondary | ICD-10-CM

## 2016-04-11 DIAGNOSIS — K449 Diaphragmatic hernia without obstruction or gangrene: Secondary | ICD-10-CM | POA: Insufficient documentation

## 2016-04-11 DIAGNOSIS — Z87442 Personal history of urinary calculi: Secondary | ICD-10-CM

## 2016-04-11 DIAGNOSIS — E119 Type 2 diabetes mellitus without complications: Secondary | ICD-10-CM | POA: Insufficient documentation

## 2016-04-11 DIAGNOSIS — G309 Alzheimer's disease, unspecified: Secondary | ICD-10-CM | POA: Insufficient documentation

## 2016-04-11 DIAGNOSIS — K219 Gastro-esophageal reflux disease without esophagitis: Secondary | ICD-10-CM

## 2016-04-11 DIAGNOSIS — Z882 Allergy status to sulfonamides status: Secondary | ICD-10-CM | POA: Insufficient documentation

## 2016-04-11 DIAGNOSIS — I10 Essential (primary) hypertension: Secondary | ICD-10-CM | POA: Insufficient documentation

## 2016-04-11 DIAGNOSIS — I480 Paroxysmal atrial fibrillation: Secondary | ICD-10-CM

## 2016-04-11 DIAGNOSIS — Z885 Allergy status to narcotic agent status: Secondary | ICD-10-CM | POA: Insufficient documentation

## 2016-04-11 DIAGNOSIS — Z880 Allergy status to penicillin: Secondary | ICD-10-CM | POA: Insufficient documentation

## 2016-04-11 DIAGNOSIS — I495 Sick sinus syndrome: Secondary | ICD-10-CM | POA: Insufficient documentation

## 2016-04-11 DIAGNOSIS — N132 Hydronephrosis with renal and ureteral calculous obstruction: Secondary | ICD-10-CM

## 2016-04-11 DIAGNOSIS — Z7984 Long term (current) use of oral hypoglycemic drugs: Secondary | ICD-10-CM

## 2016-04-11 DIAGNOSIS — R110 Nausea: Secondary | ICD-10-CM | POA: Diagnosis not present

## 2016-04-11 DIAGNOSIS — M1990 Unspecified osteoarthritis, unspecified site: Secondary | ICD-10-CM

## 2016-04-11 DIAGNOSIS — Z886 Allergy status to analgesic agent status: Secondary | ICD-10-CM | POA: Insufficient documentation

## 2016-04-11 DIAGNOSIS — Z7901 Long term (current) use of anticoagulants: Secondary | ICD-10-CM

## 2016-04-11 DIAGNOSIS — Z888 Allergy status to other drugs, medicaments and biological substances status: Secondary | ICD-10-CM

## 2016-04-11 DIAGNOSIS — N201 Calculus of ureter: Secondary | ICD-10-CM | POA: Diagnosis not present

## 2016-04-11 DIAGNOSIS — A419 Sepsis, unspecified organism: Secondary | ICD-10-CM | POA: Diagnosis not present

## 2016-04-11 HISTORY — PX: URETEROSCOPY WITH HOLMIUM LASER LITHOTRIPSY: SHX6645

## 2016-04-11 HISTORY — PX: CYSTOSCOPY W/ URETERAL STENT PLACEMENT: SHX1429

## 2016-04-11 HISTORY — PX: CYSTOSCOPY W/ URETERAL STENT REMOVAL: SHX1430

## 2016-04-11 SURGERY — URETEROSCOPY, WITH LITHOTRIPSY USING HOLMIUM LASER
Anesthesia: General | Site: Ureter | Laterality: Right | Wound class: Clean Contaminated

## 2016-04-11 MED ORDER — LACTATED RINGERS IV SOLN
INTRAVENOUS | Status: DC
  Administered 2016-04-11: 08:00:00 via INTRAVENOUS

## 2016-04-11 MED ORDER — IOTHALAMATE MEGLUMINE 43 % IV SOLN
INTRAVENOUS | Status: DC | PRN
  Administered 2016-04-11: 15 mL

## 2016-04-11 MED ORDER — PROPOFOL 10 MG/ML IV BOLUS
INTRAVENOUS | Status: DC | PRN
  Administered 2016-04-11: 120 mg via INTRAVENOUS

## 2016-04-11 MED ORDER — ONDANSETRON HCL 4 MG/2ML IJ SOLN
INTRAMUSCULAR | Status: DC | PRN
  Administered 2016-04-11: 4 mg via INTRAVENOUS

## 2016-04-11 MED ORDER — ONDANSETRON HCL 4 MG/2ML IJ SOLN: 4.0000 mg | Freq: Once | INTRAMUSCULAR | Status: DC | PRN

## 2016-04-11 MED ORDER — ONDANSETRON HCL 4 MG/2ML IJ SOLN
INTRAMUSCULAR | Status: AC
  Filled 2016-04-11: qty 2

## 2016-04-11 MED ORDER — CIPROFLOXACIN IN D5W 400 MG/200ML IV SOLN
INTRAVENOUS | Status: AC
  Filled 2016-04-11: qty 200

## 2016-04-11 MED ORDER — ROCURONIUM BROMIDE 50 MG/5ML IV SOSY
PREFILLED_SYRINGE | INTRAVENOUS | Status: AC
  Filled 2016-04-11: qty 5

## 2016-04-11 MED ORDER — SUGAMMADEX SODIUM 200 MG/2ML IV SOLN
INTRAVENOUS | Status: AC
  Filled 2016-04-11: qty 2

## 2016-04-11 MED ORDER — FENTANYL CITRATE (PF) 100 MCG/2ML IJ SOLN: 25.0000 ug | INTRAMUSCULAR | Status: DC | PRN

## 2016-04-11 MED ORDER — LIDOCAINE HCL (PF) 2 % IJ SOLN
INTRAMUSCULAR | Status: AC
  Filled 2016-04-11: qty 2

## 2016-04-11 MED ORDER — HYDROCODONE-ACETAMINOPHEN 5-325 MG PO TABS: 1.0000 | ORAL_TABLET | Freq: Four times a day (QID) | ORAL | 0 refills | Status: DC | PRN

## 2016-04-11 MED ORDER — SUCCINYLCHOLINE CHLORIDE 200 MG/10ML IV SOSY
PREFILLED_SYRINGE | INTRAVENOUS | Status: AC
  Filled 2016-04-11: qty 10

## 2016-04-11 MED ORDER — FENTANYL CITRATE (PF) 100 MCG/2ML IJ SOLN
INTRAMUSCULAR | Status: AC
  Filled 2016-04-11: qty 2

## 2016-04-11 MED ORDER — LIDOCAINE HCL (CARDIAC) 20 MG/ML IV SOLN
INTRAVENOUS | Status: DC | PRN
  Administered 2016-04-11: 80 mg via INTRAVENOUS

## 2016-04-11 MED ORDER — ROCURONIUM BROMIDE 100 MG/10ML IV SOLN
INTRAVENOUS | Status: DC | PRN
  Administered 2016-04-11: 30 mg via INTRAVENOUS
  Administered 2016-04-11 (×2): 5 mg via INTRAVENOUS

## 2016-04-11 MED ORDER — PHENYLEPHRINE HCL 10 MG/ML IJ SOLN
INTRAMUSCULAR | Status: AC
  Filled 2016-04-11: qty 1

## 2016-04-11 MED ORDER — FENTANYL CITRATE (PF) 100 MCG/2ML IJ SOLN
INTRAMUSCULAR | Status: DC | PRN
  Administered 2016-04-11: 100 ug via INTRAVENOUS

## 2016-04-11 MED ORDER — SUCCINYLCHOLINE CHLORIDE 20 MG/ML IJ SOLN
INTRAMUSCULAR | Status: DC | PRN
  Administered 2016-04-11: 100 mg via INTRAVENOUS

## 2016-04-11 MED ORDER — SUGAMMADEX SODIUM 200 MG/2ML IV SOLN
INTRAVENOUS | Status: DC | PRN
  Administered 2016-04-11: 150 mg via INTRAVENOUS

## 2016-04-11 MED ORDER — PROPOFOL 10 MG/ML IV BOLUS
INTRAVENOUS | Status: AC
  Filled 2016-04-11: qty 20

## 2016-04-11 MED ORDER — PHENYLEPHRINE HCL 10 MG/ML IJ SOLN
INTRAMUSCULAR | Status: DC | PRN
  Administered 2016-04-11: 50 ug via INTRAVENOUS

## 2016-04-11 SURGICAL SUPPLY — 29 items
BAG DRAIN CYSTO-URO LG1000N (MISCELLANEOUS) ×4 IMPLANT
BASKET ZERO TIP 1.9FR (BASKET) ×4 IMPLANT
CATH URETL 5X70 OPEN END (CATHETERS) ×4 IMPLANT
CNTNR SPEC 2.5X3XGRAD LEK (MISCELLANEOUS) ×3
CONRAY 43 FOR UROLOGY 50M (MISCELLANEOUS) ×4 IMPLANT
CONT SPEC 4OZ STER OR WHT (MISCELLANEOUS) ×1
CONTAINER SPEC 2.5X3XGRAD LEK (MISCELLANEOUS) ×3 IMPLANT
DRAPE UTILITY 15X26 TOWEL STRL (DRAPES) ×4 IMPLANT
FIBER LASER LITHO 273 (Laser) ×4 IMPLANT
GLOVE BIO SURGEON STRL SZ 6.5 (GLOVE) ×4 IMPLANT
GOWN STRL REUS W/ TWL LRG LVL3 (GOWN DISPOSABLE) ×6 IMPLANT
GOWN STRL REUS W/TWL LRG LVL3 (GOWN DISPOSABLE) ×2
GUIDEWIRE GREEN .038 145CM (MISCELLANEOUS) IMPLANT
INFUSOR MANOMETER BAG 3000ML (MISCELLANEOUS) ×4 IMPLANT
INTRODUCER DILATOR DOUBLE (INTRODUCER) IMPLANT
KIT RM TURNOVER CYSTO AR (KITS) ×4 IMPLANT
PACK CYSTO AR (MISCELLANEOUS) ×4 IMPLANT
SCRUB POVIDONE IODINE 4 OZ (MISCELLANEOUS) ×4 IMPLANT
SENSORWIRE 0.038 NOT ANGLED (WIRE) ×4
SET CYSTO W/LG BORE CLAMP LF (SET/KITS/TRAYS/PACK) ×4 IMPLANT
SHEATH URETERAL 12FRX35CM (MISCELLANEOUS) IMPLANT
SOL .9 NS 3000ML IRR  AL (IV SOLUTION) ×1
SOL .9 NS 3000ML IRR UROMATIC (IV SOLUTION) ×3 IMPLANT
STENT URET 6FRX24 CONTOUR (STENTS) IMPLANT
STENT URET 6FRX26 CONTOUR (STENTS) ×4 IMPLANT
SURGILUBE 2OZ TUBE FLIPTOP (MISCELLANEOUS) ×4 IMPLANT
SYRINGE IRR TOOMEY STRL 70CC (SYRINGE) IMPLANT
WATER STERILE IRR 1000ML POUR (IV SOLUTION) ×4 IMPLANT
WIRE SENSOR 0.038 NOT ANGLED (WIRE) ×3 IMPLANT

## 2016-04-11 NOTE — Transfer of Care (Signed)
Immediate Anesthesia Transfer of Care Note  Patient: William PigeonJames B Demarcus Sr.  Procedure(s) Performed: Procedure(s): URETEROSCOPY WITH HOLMIUM LASER LITHOTRIPSY (Bilateral) CYSTOSCOPY WITH STENT REPLACEMENT (Left) CYSTOSCOPY WITH STENT REMOVAL (Right)  Patient Location: PACU  Anesthesia Type:General  Level of Consciousness: awake and responds to stimulation  Airway & Oxygen Therapy: Patient Spontanous Breathing and Patient connected to face mask oxygen  Post-op Assessment: Report given to RN and Post -op Vital signs reviewed and stable  Post vital signs: Reviewed and stable  Last Vitals:  Vitals:   04/11/16 0802 04/11/16 0952  BP: (!) 130/94 130/87  Pulse: 72 72  Resp: 18 16  Temp: 36.7 C     Last Pain:  Vitals:   04/11/16 0802  TempSrc: Tympanic  PainSc: 0-No pain         Complications: No apparent anesthesia complications

## 2016-04-11 NOTE — Anesthesia Procedure Notes (Signed)
Procedure Name: Intubation Performed by: July Linam Pre-anesthesia Checklist: Patient identified, Patient being monitored, Timeout performed, Emergency Drugs available and Suction available Patient Re-evaluated:Patient Re-evaluated prior to inductionOxygen Delivery Method: Circle system utilized Preoxygenation: Pre-oxygenation with 100% oxygen Intubation Type: IV induction Ventilation: Mask ventilation without difficulty Laryngoscope Size: Mac and 3 Grade View: Grade I Tube type: Oral Tube size: 7.5 mm Number of attempts: 1 Airway Equipment and Method: Stylet Placement Confirmation: ETT inserted through vocal cords under direct vision,  positive ETCO2 and breath sounds checked- equal and bilateral Secured at: 22 cm Tube secured with: Tape Dental Injury: Teeth and Oropharynx as per pre-operative assessment        

## 2016-04-11 NOTE — Anesthesia Post-op Follow-up Note (Cosign Needed)
Anesthesia QCDR form completed.        

## 2016-04-11 NOTE — Anesthesia Postprocedure Evaluation (Signed)
Anesthesia Post Note  Patient: William Lozano.  Procedure(s) Performed: Procedure(s) (LRB): URETEROSCOPY WITH HOLMIUM LASER LITHOTRIPSY (Bilateral) CYSTOSCOPY WITH STENT REPLACEMENT (Left) CYSTOSCOPY WITH STENT REMOVAL (Right)  Patient location during evaluation: PACU Anesthesia Type: General Level of consciousness: awake and alert and oriented Pain management: pain level controlled Vital Signs Assessment: post-procedure vital signs reviewed and stable Respiratory status: spontaneous breathing Cardiovascular status: blood pressure returned to baseline Anesthetic complications: no     Last Vitals:  Vitals:   04/11/16 1038 04/11/16 1128  BP: 125/77 122/79  Pulse: 74 75  Resp: 16 18  Temp: 36.2 C     Last Pain:  Vitals:   04/11/16 1038  TempSrc: Temporal  PainSc:                  [name]

## 2016-04-11 NOTE — Discharge Instructions (Signed)
You have a ureteral stent in place.  This is a tube that extends from your kidney to your bladder.  This may cause urinary bleeding, burning with urination, and urinary frequency.  Please call our office or present to the ED if you develop fevers >101 or pain which is not able to be controlled with oral pain medications.  You may be given either Flomax and/ or ditropan to help with bladder spasms and stent pain in addition to pain medications.   ° °[facility] °[address], [address] °[place], [place] [address] °[phone] ° ° ° °AMBULATORY SURGERY  °DISCHARGE INSTRUCTIONS ° ° °1) The drugs that you were given will stay in your system until tomorrow so for the next 24 hours you should not: ° °A) Drive an automobile °B) Make any legal decisions °C) Drink any alcoholic beverage ° ° °2) You may resume regular meals tomorrow.  Today it is better to start with liquids and gradually work up to solid foods. ° °You may eat anything you prefer, but it is better to start with liquids, then soup and crackers, and gradually work up to solid foods. ° ° °3) Please notify your doctor immediately if you have any unusual bleeding, trouble breathing, redness and pain at the surgery site, drainage, fever, or pain not relieved by medication. ° ° ° °4) Additional Instructions: ° ° ° ° ° ° ° °Please contact your physician with any problems or Same Day Surgery at [phone], Monday through Friday 6 am to 4 pm, or [facility] Main number at [phone]. °

## 2016-04-11 NOTE — Anesthesia Preprocedure Evaluation (Signed)
Anesthesia Evaluation  Patient identified by MRN, date of birth, ID band Patient awake    Reviewed: Allergy & Precautions, NPO status , Patient's Chart, lab work & pertinent test results  History of Anesthesia Complications (+) PROLONGED EMERGENCE  Airway Mallampati: II       Dental  (+) Chipped, Poor Dentition   Pulmonary neg pulmonary ROS,    Pulmonary exam normal        Cardiovascular hypertension, Normal cardiovascular exam+ dysrhythmias Atrial Fibrillation      Neuro/Psych PSYCHIATRIC DISORDERS dementianegative neurological ROS     GI/Hepatic GERD  Medicated and Controlled,  Endo/Other  diabetes, Well Controlled, Type 2, Oral Hypoglycemic Agents  Renal/GU stones  negative genitourinary   Musculoskeletal  (+) Arthritis , Osteoarthritis,    Abdominal Normal abdominal exam  (+)   Peds negative pediatric ROS (+)  Hematology negative hematology ROS (+)   Anesthesia Other Findings   Reproductive/Obstetrics                             Anesthesia Physical Anesthesia Plan  ASA: III  Anesthesia Plan: General   Post-op Pain Management:    Induction: Intravenous  Airway Management Planned: Oral ETT  Additional Equipment:   Intra-op Plan:   Post-operative Plan: Extubation in OR  Informed Consent: I have reviewed the patients History and Physical, chart, labs and discussed the procedure including the risks, benefits and alternatives for the proposed anesthesia with the patient or authorized representative who has indicated his/her understanding and acceptance.   Dental advisory given  Plan Discussed with: CRNA and Surgeon  Anesthesia Plan Comments:         Anesthesia Quick Evaluation

## 2016-04-11 NOTE — Interval H&P Note (Signed)
History and Physical Interval Note:  04/11/2016 8:18 AM  William PigeonJames B Rebuck Sr.  has presented today for surgery, with the diagnosis of BILATERAL URETERAL STONES  The various methods of treatment have been discussed with the patient and family. After consideration of risks, benefits and other options for treatment, the patient has consented to  Procedure(s): URETEROSCOPY WITH HOLMIUM LASER LITHOTRIPSY (Bilateral) CYSTOSCOPY WITH STENT REPLACEMENT (Bilateral) as a surgical intervention .  The patient's history has been reviewed, patient examined, no change in status, stable for surgery.  I have reviewed the patient's chart and labs.  Questions were answered to the patient's satisfaction.    RRR CTAB  Vanna ScotlandAshley Treysen Sudbeck

## 2016-04-11 NOTE — Op Note (Signed)
Date of procedure: 04/11/16  Preoperative diagnosis:  1. Bilateral obstructing ureteral calculi   Postoperative diagnosis:  1. Right distal ureteral calculi 2. Negative left ureteroscopy   Procedure: 1. Bilateral ureteroscopy 2. Bilateral retrograde pyelogram 3. Left laser lithotripsy with basket extraction of Stone fragment 4. Left ureteral stent placement  Surgeon: Vanna ScotlandAshley Caelie Remsburg, MD  Anesthesia: General  Complications: None  Intraoperative findings: No right distal ureteral stone identified. Brisk drainage on right retrograde pyelogram therefore no stent left. Significant left distal ureteral stone burden with at least 5 stones ranging up to 8 mm in size within the left distal ureter.  EBL: Minimal  Specimens: None  Drains: 6 x 26 French double-J ureteral stent on left  Indication: William RegulusJames B Nwosu Sr. is a 83 y.o. patient with significant left distal ureteral stone burden and possible right ureteral stone status post urgent bilateral ureteral stent placement who returns today for definitive management of his stones.  After reviewing the management options for treatment, he elected to proceed with the above surgical procedure(s). We have discussed the potential benefits and risks of the procedure, side effects of the proposed treatment, the likelihood of the patient achieving the goals of the procedure, and any potential problems that might occur during the procedure or recuperation. Informed consent has been obtained.  Description of procedure:  The patient was taken to the operating room and general anesthesia was induced.  The patient was placed in the dorsal lithotomy position, prepped and draped in the usual sterile fashion, and preoperative antibiotics were administered. A preoperative time-out was performed.   A 21 French scope was advanced per urethra into the bladder. Attention was turned to the right ureteral orifice from which a ureteral stent was seen emanating. The  distal end of the coil was grasped and brought to level of the urethral meatus. The stent was then cannulated sensor wire up to level of the kidney. This is now to place as a safety wire. A 4.5 French semirigid ureteroscope was then advanced through the distal ureter without difficulty up to level of the mid ureter without complications. No stones or stone fragments were identified. There is no significant ureteral edema or any other concerning findings. The safety wire was then removed. A 5 French open-ended ureteral catheter was used to perform a retrograde pyelogram on the side which showed brisk drainage of the right ureter and collecting system thus the decision was made not to replace the stent on the side given negative ureteroscopic findings.   At this point in time, attention was turned to the left ureteral orifice from which a ureteral stent was seen emanating. The distal coil of the stent was grasped and brought to level of the urethral meatus. The stent was then cannulated using a sensor wire up to level of the kidney leaving the wire place and removing the stent. The stent was then snapped in place as a safety wire. A 4.5 French semirigid ureteroscope was then advanced into the distal ureter were immediately numerous stones were encountered. A 273  laser fiber was then used using the settings of 0.8 J and 10 Hz to fragment the stones into very small pieces. All of these pieces were then extracted using a 1.9 [place]French to plus spinal basket. Once complete and the distal ureter was clear, the scope was advanced all the way up to level of the proximal ureter without difficulty. There is no additional stones identified and no ureteral injury appreciated. A retrograde pyelogram was performed through  the scope which revealed no urinary extravasation and no significant filling defects although there was an area of narrowing within the distal ureter which was several centimeters in length with the stones have  been previously lodged. The scope was then backed down the length of the ureter inspecting along the way no residual stones were identified. The safety wire was then backloaded over a rigid cystoscope and a 6 x 26 French double-J ureteral stent was advanced over the wire up to level kidney. The wire was partially drawn until full coil was noted within the renal pelvis. The wire was then fully withdrawn and a full coil was noted within the bladder. The bladder was then drained and all of the stone fragments were evacuated. These were not sent for analysis and as he had this completed last year around this time. The bladder was then drained and the scope was removed. The patient was cleaned and dried, repositioned the supine position, reversed from anesthesia, taken to the PACU in stable condition.  Plan: Patient will return next week for cystoscopy, stent removal from the left side. Vitals were discussed with the patient's wife and all questions were answered. He will likely need a 24-hour urine metabolic workup given his recurrent stones.    Vanna Scotland, M.D.

## 2016-04-11 NOTE — H&P (View-Only) (Signed)
04/06/2016 10:40 AM   [name] Sr. [birth year] [id number]  Referring provider: Marguarite Arbour, MD [address] [facility] [place], [place] [address]  No chief complaint on file.   HPI: 83 yo M with Alzheimer's dementia recent admission for bilateral ureteral stent placement on 03/27/16 in the setting of 8 mm left UVJ with second smaller stone and much smaller rigtht UJV stone.  He initially presented with left flank pain, nausea, and vomiting which was poorly controlled.  Urine culture was ultimately negative. He has been taking antibiotics (Levaquin) prescribed at the time of his stent placement.  Today, he denies any fevers, chills, severe pain. He does have some bladder symptoms related to his stents but otherwise unremarkable.  He returns to the office today to discuss definitive management of his stones.      He does have a history of urosepsis from obstructing stones in the past.  He has had shockwave lithotripsy unsuccessfully in the past requiring follow-up ureteroscopy. His most recent procedure was 1 year prior.  Previous stone analysis consistent with 95% calcium oxalate monohydrate, 5% calcium phosphate.  He and his family have some concerns about cognitive decline with multiple anesthetics given over the past 12 months.  He is on Elliquis for A. fib.  PMH: Past Medical History:  Diagnosis Date  . Arthritis   . Atrial fibrillation (HCC) 2016   paroxysmal atrial fibrillation, sick sinus syndrome  . Dementia    alzheimers  . Essential hypertension   . Hyperlipemia   . Kidney stones     Surgical History: Past Surgical History:  Procedure Laterality Date  . CYSTOSCOPY W/ RETROGRADES Bilateral 03/27/2016   Procedure: CYSTOSCOPY WITH RETROGRADE PYELOGRAM;  Surgeon: Vanna Scotland, MD;  Location: [facility];  Service: Urology;  Laterality: Bilateral;  . CYSTOSCOPY W/ URETERAL STENT PLACEMENT Bilateral 02/24/2015   Procedure: CYSTOSCOPY WITH STENT  REPLACEMENT;  Surgeon: Hildred Laser, MD;  Location: [facility];  Service: Urology;  Laterality: Bilateral;  . CYSTOSCOPY WITH RETROGRADE PYELOGRAM, URETEROSCOPY AND STENT PLACEMENT Bilateral 12/10/2014   Procedure: CYSTOSCOPY WITH RETROGRADE PYELOGRAM, URETEROSCOPY AND STENT PLACEMENT;  Surgeon: Hildred Laser, MD;  Location: [facility];  Service: Urology;  Laterality: Bilateral;  . CYSTOSCOPY WITH STENT PLACEMENT Bilateral 12/10/2014   Procedure: CYSTOSCOPY WITH STENT PLACEMENT;  Surgeon: Hildred Laser, MD;  Location: [facility];  Service: Urology;  Laterality: Bilateral;  . CYSTOSCOPY WITH STENT PLACEMENT Bilateral 03/27/2016   Procedure: CYSTOSCOPY WITH STENT PLACEMENT;  Surgeon: Vanna Scotland, MD;  Location: [facility];  Service: Urology;  Laterality: Bilateral;  . EXTRACORPOREAL SHOCK WAVE LITHOTRIPSY Right 01/07/2015   Procedure: EXTRACORPOREAL SHOCK WAVE LITHOTRIPSY (ESWL);  Surgeon: Lorraine Lax, MD;  Location: [facility];  Service: Urology;  Laterality: Right;  . EXTRACORPOREAL SHOCK WAVE LITHOTRIPSY Left 01/21/2015   Procedure: EXTRACORPOREAL SHOCK WAVE LITHOTRIPSY (ESWL);  Surgeon: Hildred Laser, MD;  Location: [facility];  Service: Urology;  Laterality: Left;  . EYE SURGERY Bilateral    Cataract Extraction with IOL  . HERNIA REPAIR Bilateral    inguinal  . URETEROSCOPY WITH HOLMIUM LASER LITHOTRIPSY Bilateral 02/24/2015   Procedure: URETEROSCOPY WITH HOLMIUM LASER LITHOTRIPSY /STONE BASKETING;  Surgeon: Hildred Laser, MD;  Location: [facility];  Service: Urology;  Laterality: Bilateral;    Home Medications:  Allergies as of 04/06/2016      Reactions   Codeine Other (See Comments)   Causes side effects   Diazepam Other (See Comments)   Ibuprofen Other (  See Comments)   Penicillin G Rash   Sulfa Antibiotics Rash      Medication List       Accurate as of 04/06/16 10:40 AM. Always use your most recent med list.          apixaban 5 MG Tabs tablet Commonly known as:   ELIQUIS Take 5 mg by mouth 2 (two) times daily.   levofloxacin 250 MG tablet Commonly known as:  LEVAQUIN Take 1 tablet (250 mg total) by mouth daily.   metoprolol succinate 25 MG 24 hr tablet Commonly known as:  TOPROL-XL Take 25 mg by mouth daily.   pantoprazole 40 MG tablet Commonly known as:  PROTONIX Take 40 mg by mouth daily.   rivastigmine 1.5 MG capsule Commonly known as:  EXELON Take 1.5 mg by mouth 2 (two) times daily.   silodosin 8 MG Caps capsule Commonly known as:  RAPAFLO Take 1 capsule (8 mg total) by mouth daily with breakfast.   simvastatin 20 MG tablet Commonly known as:  ZOCOR Take 20 mg by mouth daily.   traMADol 50 MG tablet Commonly known as:  ULTRAM Take 1 tablet (50 mg total) by mouth every 6 (six) hours as needed for moderate pain.       Allergies:  Allergies  Allergen Reactions  . Codeine Other (See Comments)    Causes side effects  . Diazepam Other (See Comments)  . Ibuprofen Other (See Comments)  . Penicillin G Rash  . Sulfa Antibiotics Rash    Family History: Family History  Problem Relation Age of Onset  . Hypertension    . Kidney disease Neg Hx   . Prostate cancer Neg Hx     Social History:  reports that he has never smoked. He has never used smokeless tobacco. He reports that he does not drink alcohol or use drugs.  ROS: UROLOGY Frequent Urination?: No Hard to postpone urination?: No Burning/pain with urination?: No Get up at night to urinate?: No Leakage of urine?: No Urine stream starts and stops?: No Trouble starting stream?: No Do you have to strain to urinate?: No Blood in urine?: No Urinary tract infection?: No Sexually transmitted disease?: No Injury to kidneys or bladder?: No Painful intercourse?: No Weak stream?: No Erection problems?: No Penile pain?: No  Gastrointestinal Nausea?: No Vomiting?: No Indigestion/heartburn?: No Diarrhea?: No Constipation?: No  Constitutional Fever: No Night  sweats?: No Weight loss?: No Fatigue?: No  Skin Skin rash/lesions?: No Itching?: No  Eyes Blurred vision?: No Double vision?: No  Ears/Nose/Throat Sore throat?: No Sinus problems?: No  Hematologic/Lymphatic Swollen glands?: No Easy bruising?: No  Cardiovascular Leg swelling?: No Chest pain?: No  Respiratory Cough?: No Shortness of breath?: No  Endocrine Excessive thirst?: No  Musculoskeletal Back pain?: No Joint pain?: No  Neurological Headaches?: No Dizziness?: No  Psychologic Depression?: No Anxiety?: No  Physical Exam: BP (!) 132/94   Pulse 97   Ht 5\' 6"  (1.676 m)   Wt 156 lb (70.8 kg)   BMI 25.18 kg/m   Constitutional:  Alert and oriented, No acute distress.  He is accompanied today by 2 family members. HEENT: [place] AT, moist mucus membranes.  Trachea midline, no masses. Cardiovascular: No clubbing, cyanosis, or edema. Respiratory: Normal respiratory effort, no increased work of breathing. GI: Abdomen is soft, nontender, nondistended, no abdominal masses GU: No CVA tenderness.  Skin: No rashes, bruises or suspicious lesions. Neurologic: Grossly intact, no focal deficits, moving all 4 extremities. Psychiatric: Normal mood and affect.  Laboratory Data: Lab Results  Component Value Date   WBC 18.4 (H) 03/28/2016   HGB 15.2 03/28/2016   HCT 44.1 03/28/2016   MCV 90.8 03/28/2016   PLT 234 03/28/2016    Lab Results  Component Value Date   CREATININE 1.40 (H) 03/28/2016     Lab Results  Component Value Date   HGBA1C 6.7 (H) 02/24/2015    Urinalysis UA/ UCx from today pendin  Pertinent Imaging: Study Result   CLINICAL DATA:  Left lower quadrant abdominal pain with nausea, vomiting, and fever.  EXAM: CT ABDOMEN AND PELVIS WITH CONTRAST  TECHNIQUE: Multidetector CT imaging of the abdomen and pelvis was performed using the standard protocol following bolus administration of intravenous contrast.  CONTRAST:  75mL ISOVUE-300  IOPAMIDOL (ISOVUE-300) INJECTION 61%  COMPARISON:  CT scan dated 12/09/2014  FINDINGS: Lower chest: Borderline cardiomegaly. Minimal scarring at the left lung base. Small hiatal hernia.  Hepatobiliary: No focal liver abnormality is seen. No gallstones, gallbladder wall thickening, or biliary dilatation.  Pancreas: Unremarkable. No pancreatic ductal dilatation or surrounding inflammatory changes.  Spleen: Normal in size without focal abnormality.  Adrenals/Urinary Tract: The adrenal glands are normal. There are numerous stones in the distal left ureter with chronic left hydronephrosis as well as multiple cysts in the left kidney as well as a 17 mm cyst in the lower pole the right kidney. There is a 2 mm stone in the right ureterovesical junction with the least 5 stones in the distal left ureter including 3 in the left ureterovesical junction and an 8 mm stone in the distal left ureter proximal to the UVJ.  Stomach/Bowel: Small hiatal hernia.  Otherwise normal.  Vascular/Lymphatic: Extensive aortic atherosclerosis. No adenopathy.  Reproductive: Enlarged prostate gland.  Other: No free air or free fluid.  Musculoskeletal: No acute or significant osseous findings.  IMPRESSION: 1. Numerous stones in the distal left ureter creating left hydronephrosis and perinephric soft tissue stranding. 2. Tiny stone in the right ureterovesical junction with only minimal dilatation of the right ureter. 3. Small hiatal hernia.   Electronically Signed   By: Francene Boyers M.D.   On: 03/27/2016 12:47   CT reviewed again today  Assessment & Plan:    1. Bilateral ureteral calculi S/p urgent bilateral ureteral stent placement who returns today to discuss definitive management of his stone  Risks and benefits of ureteroscopy were reviewed including but not limited to infection, bleeding, pain, ureteral injury which could require open surgery versus prolonged indwelling if  ureteralperforation occurs, persistent stone disease, requirement for staged procedure, possible stent, and global anesthesia risks. Patient expressed understanding and desires to proceed with ureteroscopy.  In terms of his concerns about general anesthesia, given the location of the stones, it would be reasonable to attempt this under spinal anesthesia. I advised him to discuss this further with my anesthesia colleagues prior to surgery.  Agree with holding Elliquis risks 2 days before the procedure per cardiology  2. Recurrent nephrolithiasis Given her recurrent nephrolithiasis, recommend 24-hour urine metabolic workup following treatment of the stones  - Urinalysis, Complete  Scheduled bilateral ureteroscopy, laser lithotripsy, bilateral ureteral stent exchange  Vanna Scotland, MD  [facility] [address], [address] [place], [place] [address] [phone]

## 2016-04-12 ENCOUNTER — Encounter: Payer: Self-pay | Admitting: *Deleted

## 2016-04-12 ENCOUNTER — Emergency Department: Payer: Medicare Other

## 2016-04-12 ENCOUNTER — Inpatient Hospital Stay
Admission: EM | Admit: 2016-04-12 | Discharge: 2016-04-15 | DRG: 854 | Disposition: A | Discharge status: Home Health | Payer: Medicare Other | Attending: Internal Medicine | Admitting: Internal Medicine

## 2016-04-12 ENCOUNTER — Telehealth: Payer: Self-pay

## 2016-04-12 DIAGNOSIS — N132 Hydronephrosis with renal and ureteral calculous obstruction: Secondary | ICD-10-CM | POA: Diagnosis present

## 2016-04-12 DIAGNOSIS — Z880 Allergy status to penicillin: Secondary | ICD-10-CM | POA: Diagnosis not present

## 2016-04-12 DIAGNOSIS — Z7901 Long term (current) use of anticoagulants: Secondary | ICD-10-CM | POA: Diagnosis not present

## 2016-04-12 DIAGNOSIS — Z8249 Family history of ischemic heart disease and other diseases of the circulatory system: Secondary | ICD-10-CM

## 2016-04-12 DIAGNOSIS — E872 Acidosis: Secondary | ICD-10-CM | POA: Diagnosis present

## 2016-04-12 DIAGNOSIS — Z452 Encounter for adjustment and management of vascular access device: Secondary | ICD-10-CM

## 2016-04-12 DIAGNOSIS — Z87442 Personal history of urinary calculi: Secondary | ICD-10-CM

## 2016-04-12 DIAGNOSIS — I10 Essential (primary) hypertension: Secondary | ICD-10-CM | POA: Diagnosis present

## 2016-04-12 DIAGNOSIS — A419 Sepsis, unspecified organism: Principal | ICD-10-CM | POA: Diagnosis present

## 2016-04-12 DIAGNOSIS — K219 Gastro-esophageal reflux disease without esophagitis: Secondary | ICD-10-CM | POA: Diagnosis present

## 2016-04-12 DIAGNOSIS — I480 Paroxysmal atrial fibrillation: Secondary | ICD-10-CM | POA: Diagnosis present

## 2016-04-12 DIAGNOSIS — Z885 Allergy status to narcotic agent status: Secondary | ICD-10-CM

## 2016-04-12 DIAGNOSIS — R4182 Altered mental status, unspecified: Secondary | ICD-10-CM | POA: Diagnosis not present

## 2016-04-12 DIAGNOSIS — F0280 Dementia in other diseases classified elsewhere without behavioral disturbance: Secondary | ICD-10-CM | POA: Diagnosis present

## 2016-04-12 DIAGNOSIS — I129 Hypertensive chronic kidney disease with stage 1 through stage 4 chronic kidney disease, or unspecified chronic kidney disease: Secondary | ICD-10-CM | POA: Diagnosis present

## 2016-04-12 DIAGNOSIS — I495 Sick sinus syndrome: Secondary | ICD-10-CM | POA: Diagnosis present

## 2016-04-12 DIAGNOSIS — Z882 Allergy status to sulfonamides status: Secondary | ICD-10-CM

## 2016-04-12 DIAGNOSIS — N390 Urinary tract infection, site not specified: Secondary | ICD-10-CM | POA: Diagnosis present

## 2016-04-12 DIAGNOSIS — R110 Nausea: Secondary | ICD-10-CM

## 2016-04-12 DIAGNOSIS — Z79899 Other long term (current) drug therapy: Secondary | ICD-10-CM | POA: Diagnosis not present

## 2016-04-12 DIAGNOSIS — N183 Chronic kidney disease, stage 3 (moderate): Secondary | ICD-10-CM | POA: Diagnosis present

## 2016-04-12 DIAGNOSIS — G301 Alzheimer's disease with late onset: Secondary | ICD-10-CM | POA: Diagnosis present

## 2016-04-12 DIAGNOSIS — D72829 Elevated white blood cell count, unspecified: Secondary | ICD-10-CM | POA: Diagnosis not present

## 2016-04-12 DIAGNOSIS — N200 Calculus of kidney: Secondary | ICD-10-CM

## 2016-04-12 DIAGNOSIS — Z1612 Extended spectrum beta lactamase (ESBL) resistance: Secondary | ICD-10-CM | POA: Diagnosis present

## 2016-04-12 DIAGNOSIS — I4891 Unspecified atrial fibrillation: Secondary | ICD-10-CM | POA: Diagnosis present

## 2016-04-12 DIAGNOSIS — K449 Diaphragmatic hernia without obstruction or gangrene: Secondary | ICD-10-CM | POA: Diagnosis present

## 2016-04-12 DIAGNOSIS — E785 Hyperlipidemia, unspecified: Secondary | ICD-10-CM | POA: Diagnosis present

## 2016-04-12 LAB — CBC
HCT: 49.6 % (ref 40.0–52.0)
Hemoglobin: 17.1 g/dL (ref 13.0–18.0)
MCH: 31.2 pg (ref 26.0–34.0)
MCHC: 34.5 g/dL (ref 32.0–36.0)
MCV: 90.4 fL (ref 80.0–100.0)
Platelets: 305 10*3/uL (ref 150–440)
RBC: 5.48 MIL/uL (ref 4.40–5.90)
RDW: 14.3 % (ref 11.5–14.5)
WBC: 26.4 10*3/uL — ABNORMAL HIGH (ref 3.8–10.6)

## 2016-04-12 LAB — URINALYSIS, COMPLETE (UACMP) WITH MICROSCOPIC
Bilirubin Urine: NEGATIVE
Glucose, UA: NEGATIVE mg/dL
Ketones, ur: NEGATIVE mg/dL
Nitrite: NEGATIVE
Protein, ur: 100 mg/dL — AB
Specific Gravity, Urine: 1.005 (ref 1.005–1.030)
pH: 6 (ref 5.0–8.0)

## 2016-04-12 LAB — COMPREHENSIVE METABOLIC PANEL
ALT: 17 U/L (ref 17–63)
AST: 28 U/L (ref 15–41)
Albumin: 4.5 g/dL (ref 3.5–5.0)
Alkaline Phosphatase: 93 U/L (ref 38–126)
Anion gap: 10 (ref 5–15)
BUN: 22 mg/dL — ABNORMAL HIGH (ref 6–20)
CO2: 27 mmol/L (ref 22–32)
Calcium: 9.6 mg/dL (ref 8.9–10.3)
Chloride: 97 mmol/L — ABNORMAL LOW (ref 101–111)
Creatinine, Ser: 1.5 mg/dL — ABNORMAL HIGH (ref 0.61–1.24)
GFR calc Af Amer: 49 mL/min — ABNORMAL LOW (ref >60)
GFR calc non Af Amer: 42 mL/min — ABNORMAL LOW (ref >60)
Glucose, Bld: 191 mg/dL — ABNORMAL HIGH (ref 65–99)
Potassium: 4 mmol/L (ref 3.5–5.1)
Sodium: 134 mmol/L — ABNORMAL LOW (ref 135–145)
Total Bilirubin: 2.1 mg/dL — ABNORMAL HIGH (ref 0.3–1.2)
Total Protein: 7.4 g/dL (ref 6.5–8.1)

## 2016-04-12 LAB — DIFFERENTIAL
Basophils Absolute: 0.1 10*3/uL (ref 0–0.1)
Basophils Relative: 0 %
Eosinophils Absolute: 0 10*3/uL (ref 0–0.7)
Eosinophils Relative: 0 %
Lymphocytes Relative: 1 %
Lymphs Abs: 0.3 10*3/uL — ABNORMAL LOW (ref 1.0–3.6)
Monocytes Absolute: 0.8 10*3/uL (ref 0.2–1.0)
Monocytes Relative: 3 %
Neutro Abs: 24.9 10*3/uL — ABNORMAL HIGH (ref 1.4–6.5)
Neutrophils Relative %: 96 %

## 2016-04-12 LAB — TROPONIN I: Troponin I: <0.03 ng/mL (ref <0.03)

## 2016-04-12 LAB — LACTIC ACID, PLASMA
LACTIC ACID, VENOUS: 2.7 mmol/L — AB (ref 0.5–1.9)
Lactic Acid, Venous: 2.4 mmol/L (ref 0.5–1.9)

## 2016-04-12 MED ORDER — ACETAMINOPHEN 650 MG RE SUPP: 650.0000 mg | Freq: Four times a day (QID) | RECTAL | Status: DC | PRN

## 2016-04-12 MED ORDER — ONDANSETRON HCL 4 MG/2ML IJ SOLN: 4.0000 mg | Freq: Four times a day (QID) | INTRAMUSCULAR | Status: DC | PRN

## 2016-04-12 MED ORDER — ONDANSETRON HCL 4 MG/2ML IJ SOLN
4.0000 mg | Freq: Once | INTRAMUSCULAR | Status: AC
  Administered 2016-04-12: 4 mg via INTRAVENOUS
  Filled 2016-04-12: qty 2

## 2016-04-12 MED ORDER — METOPROLOL SUCCINATE ER 25 MG PO TB24
25.0000 mg | ORAL_TABLET | Freq: Every day | ORAL | Status: DC
  Filled 2016-04-12: qty 1

## 2016-04-12 MED ORDER — APIXABAN 5 MG PO TABS: 5.0000 mg | ORAL_TABLET | Freq: Two times a day (BID) | ORAL | Status: DC

## 2016-04-12 MED ORDER — ACETAMINOPHEN 325 MG PO TABS
650.0000 mg | ORAL_TABLET | Freq: Four times a day (QID) | ORAL | Status: DC | PRN
  Administered 2016-04-13 – 2016-04-14 (×3): 650 mg via ORAL
  Filled 2016-04-12 (×3): qty 2

## 2016-04-12 MED ORDER — SODIUM CHLORIDE 0.9% FLUSH
3.0000 mL | Freq: Two times a day (BID) | INTRAVENOUS | Status: DC
  Administered 2016-04-13 – 2016-04-15 (×6): 3 mL via INTRAVENOUS

## 2016-04-12 MED ORDER — SODIUM CHLORIDE 0.9 % IV BOLUS (SEPSIS)
1000.0000 mL | Freq: Once | INTRAVENOUS | Status: AC
  Administered 2016-04-12: 1000 mL via INTRAVENOUS

## 2016-04-12 MED ORDER — SODIUM CHLORIDE 0.9 % IV BOLUS (SEPSIS)
500.0000 mL | Freq: Once | INTRAVENOUS | Status: AC
  Administered 2016-04-12: 500 mL via INTRAVENOUS

## 2016-04-12 MED ORDER — VANCOMYCIN HCL IN DEXTROSE 1-5 GM/200ML-% IV SOLN
1000.0000 mg | Freq: Once | INTRAVENOUS | Status: DC
  Filled 2016-04-12: qty 200

## 2016-04-12 MED ORDER — ONDANSETRON HCL 4 MG PO TABS: 4.0000 mg | ORAL_TABLET | Freq: Four times a day (QID) | ORAL | Status: DC | PRN

## 2016-04-12 MED ORDER — LEVOFLOXACIN IN D5W 750 MG/150ML IV SOLN
750.0000 mg | Freq: Once | INTRAVENOUS | Status: DC
  Filled 2016-04-12: qty 150

## 2016-04-12 MED ORDER — PANTOPRAZOLE SODIUM 40 MG PO TBEC
40.0000 mg | DELAYED_RELEASE_TABLET | Freq: Every day | ORAL | Status: DC
  Administered 2016-04-13 – 2016-04-15 (×3): 40 mg via ORAL
  Filled 2016-04-12 (×3): qty 1

## 2016-04-12 MED ORDER — AZTREONAM 2 G IJ SOLR: 2.0000 g | Freq: Once | INTRAMUSCULAR | Status: DC

## 2016-04-12 MED ORDER — RIVASTIGMINE TARTRATE 1.5 MG PO CAPS
1.5000 mg | ORAL_CAPSULE | Freq: Two times a day (BID) | ORAL | Status: DC
  Administered 2016-04-13 – 2016-04-15 (×5): 1.5 mg via ORAL
  Filled 2016-04-12 (×7): qty 1

## 2016-04-12 MED ORDER — ONDANSETRON 4 MG PO TBDP: 4.0000 mg | ORAL_TABLET | ORAL | 0 refills | Status: DC | PRN

## 2016-04-12 MED ORDER — ENOXAPARIN SODIUM 40 MG/0.4ML ~~LOC~~ SOLN: 40.0000 mg | SUBCUTANEOUS | Status: DC

## 2016-04-12 MED ORDER — SIMVASTATIN 20 MG PO TABS
20.0000 mg | ORAL_TABLET | Freq: Every day | ORAL | Status: DC
  Administered 2016-04-13 – 2016-04-15 (×3): 20 mg via ORAL
  Filled 2016-04-12 (×3): qty 1

## 2016-04-12 MED ORDER — SODIUM CHLORIDE 0.9 % IV BOLUS (SEPSIS)
500.0000 mL | Freq: Once | INTRAVENOUS | Status: AC
  Administered 2016-04-13: 500 mL via INTRAVENOUS

## 2016-04-12 MED ORDER — LEVOFLOXACIN IN D5W 750 MG/150ML IV SOLN
750.0000 mg | INTRAVENOUS | Status: DC
  Administered 2016-04-12: 750 mg via INTRAVENOUS

## 2016-04-12 MED ORDER — VANCOMYCIN HCL 10 G IV SOLR
1500.0000 mg | Freq: Once | INTRAVENOUS | Status: AC
  Administered 2016-04-12: 1500 mg via INTRAVENOUS
  Filled 2016-04-12: qty 1500

## 2016-04-12 MED ORDER — DEXTROSE 5 % IV SOLN
1.0000 g | Freq: Three times a day (TID) | INTRAVENOUS | Status: DC
  Administered 2016-04-12 – 2016-04-13 (×3): 1 g via INTRAVENOUS
  Filled 2016-04-12 (×4): qty 1

## 2016-04-12 MED ORDER — SODIUM CHLORIDE 0.9 % IV SOLN
INTRAVENOUS | Status: AC
  Administered 2016-04-13 (×2): via INTRAVENOUS

## 2016-04-12 NOTE — ED Provider Notes (Signed)
[facility] Emergency Department Provider Note ____________________________________________   I have reviewed the triage vital signs and the triage nursing note.  HISTORY  Chief Complaint Nausea and Altered Mental Status   Historian Patient  HPI William RegulusJames B Taketa Sr. is a 83 y.o. male with a history of atrial fibrillation, currently just restarted his Eloquis today, after having lithotripsy yesterday for a right-sided kidney stone found about a week ago. He was prescribed tramadol and after anesthesia procedure yesterday, patient was apparently alert and had no changes with disorientation at that time which was a bit surprised to his significant other, but today he was disoriented. He apparently thought that the toilet was a rocking chair. He does have a history of dementia, and at times does have issues with disorientation. No reported new focal weakness or numbness. No report of fever. Patient initially tells me here in the ED that he has had abdominal pain, but then I asked him again and he hasn't none. Family states that he had pain on his right side when he was dealing with the pain in the kidney stone.  The reason they brought him in was mostly because of his persistent complaint of nausea over the course of the day. They were unable to get in touch with the office in order to obtain nausea medication, so chose to come straight over here. Apparently during the decision-making process, therefore urology office did call in a prescription but they have not picked it up yet.  They would like him "checked out."  No chest pain or trouble breathing.      Past Medical History:  Diagnosis Date  . Arthritis   . Atrial fibrillation (HCC) 2016   paroxysmal atrial fibrillation, sick sinus syndrome  . Complication of anesthesia    affects memory for a longer time after  . Dementia    alzheimers  . Essential hypertension   . GERD (gastroesophageal reflux disease)   .  History of kidney stones   . Hyperlipemia   . Inguinal hernia recurrent bilateral   . Kidney stones     Patient Active Problem List   Diagnosis Date Noted  . Urolithiasis 03/28/2016  . Pyuria 03/28/2016  . Hydronephrosis 03/27/2016  . Cardiac arrhythmia 07/22/2015  . Atrial fibrillation with RVR (HCC) 02/24/2015  . Cardiac conduction disorder 02/04/2015  . Arthritis 02/04/2015  . Atrial fibrillation (HCC) 02/04/2015  . Acid reflux 02/04/2015  . H/O vertigo 02/04/2015  . HLD (hyperlipidemia) 02/04/2015  . BP (high blood pressure) 02/04/2015  . Calculus of kidney 02/04/2015  . Sick sinus syndrome (HCC) 02/04/2015  . Fungal infection of nail 02/04/2015  . Type 2 diabetes mellitus (HCC) 02/04/2015  . Sepsis (HCC) 12/10/2014  . Systemic infection (HCC) 12/10/2014  . Sepsis(995.91) 12/10/2014  . Altered bowel function 10/13/2014  . Chronic diarrhea 10/13/2014  . Change in bowel habits 10/13/2014  . Senile dementia of Alzheimer's type 09/20/2014  . Alzheimer's dementia, late onset 09/20/2014  . Drug resistance 10/22/2013    Past Surgical History:  Procedure Laterality Date  . CYSTOSCOPY W/ RETROGRADES Bilateral 03/27/2016   Procedure: CYSTOSCOPY WITH RETROGRADE PYELOGRAM;  Surgeon: [name], MD;  Location: [facility];  Service: Urology;  Laterality: Bilateral;  . CYSTOSCOPY W/ URETERAL STENT PLACEMENT Bilateral 02/24/2015   Procedure: CYSTOSCOPY WITH STENT REPLACEMENT;  Surgeon: Hildred LaserBrian Victormanuel Budzyn, MD;  Location: [facility];  Service: Urology;  Laterality: Bilateral;  . CYSTOSCOPY W/ URETERAL STENT PLACEMENT Left 04/11/2016   Procedure: CYSTOSCOPY WITH STENT  REPLACEMENT;  Surgeon: Vanna Scotland, MD;  Location: [facility];  Service: Urology;  Laterality: Left;  . CYSTOSCOPY W/ URETERAL STENT REMOVAL Right 04/11/2016   Procedure: CYSTOSCOPY WITH STENT REMOVAL;  Surgeon: Vanna Scotland, MD;  Location: [facility];  Service: Urology;  Laterality: Right;  . CYSTOSCOPY WITH RETROGRADE  PYELOGRAM, URETEROSCOPY AND STENT PLACEMENT Bilateral 12/10/2014   Procedure: CYSTOSCOPY WITH RETROGRADE PYELOGRAM, URETEROSCOPY AND STENT PLACEMENT;  Surgeon: Hildred Laser, MD;  Location: [facility];  Service: Urology;  Laterality: Bilateral;  . CYSTOSCOPY WITH STENT PLACEMENT Bilateral 12/10/2014   Procedure: CYSTOSCOPY WITH STENT PLACEMENT;  Surgeon: Hildred Laser, MD;  Location: [facility];  Service: Urology;  Laterality: Bilateral;  . CYSTOSCOPY WITH STENT PLACEMENT Bilateral 03/27/2016   Procedure: CYSTOSCOPY WITH STENT PLACEMENT;  Surgeon: Vanna Scotland, MD;  Location: [facility];  Service: Urology;  Laterality: Bilateral;  . EXTRACORPOREAL SHOCK WAVE LITHOTRIPSY Right 01/07/2015   Procedure: EXTRACORPOREAL SHOCK WAVE LITHOTRIPSY (ESWL);  Surgeon: Lorraine Lax, MD;  Location: [facility];  Service: Urology;  Laterality: Right;  . EXTRACORPOREAL SHOCK WAVE LITHOTRIPSY Left 01/21/2015   Procedure: EXTRACORPOREAL SHOCK WAVE LITHOTRIPSY (ESWL);  Surgeon: Hildred Laser, MD;  Location: [facility];  Service: Urology;  Laterality: Left;  . EYE SURGERY Bilateral    Cataract Extraction with IOL  . HERNIA REPAIR Bilateral    inguinal  . URETEROSCOPY WITH HOLMIUM LASER LITHOTRIPSY Bilateral 02/24/2015   Procedure: URETEROSCOPY WITH HOLMIUM LASER LITHOTRIPSY /STONE BASKETING;  Surgeon: Hildred Laser, MD;  Location: [facility];  Service: Urology;  Laterality: Bilateral;  . URETEROSCOPY WITH HOLMIUM LASER LITHOTRIPSY Bilateral 04/11/2016   Procedure: URETEROSCOPY WITH HOLMIUM LASER LITHOTRIPSY;  Surgeon: Vanna Scotland, MD;  Location: [facility];  Service: Urology;  Laterality: Bilateral;    Prior to Admission medications   Medication Sig Start Date End Date Taking? Authorizing Provider  hydrochlorothiazide (HYDRODIURIL) 25 MG tablet Take 25 mg by mouth daily.   Yes Historical Provider, MD  HYDROcodone-acetaminophen (NORCO/VICODIN) 5-325 MG tablet Take 1-2 tablets by mouth every 6 (six) hours as  needed for moderate pain. 04/11/16  Yes Vanna Scotland, MD  metoprolol succinate (TOPROL-XL) 25 MG 24 hr tablet Take 25 mg by mouth daily.   Yes Historical Provider, MD  ondansetron (ZOFRAN ODT) 4 MG disintegrating tablet Take 1 tablet (4 mg total) by mouth every 4 (four) hours as needed for nausea or vomiting. 04/12/16  Yes Shannon A McGowan, PA-C  pantoprazole (PROTONIX) 40 MG tablet Take 40 mg by mouth daily.   Yes Historical Provider, MD  rivastigmine (EXELON) 1.5 MG capsule Take 1.5 mg by mouth 2 (two) times daily.   Yes Historical Provider, MD  simvastatin (ZOCOR) 20 MG tablet Take 20 mg by mouth daily.   Yes Historical Provider, MD  traMADol (ULTRAM) 50 MG tablet Take 1 tablet (50 mg total) by mouth every 6 (six) hours as needed for moderate pain. 03/28/16  Yes Katharina Caper, MD  apixaban (ELIQUIS) 5 MG TABS tablet Take 5 mg by mouth 2 (two) times daily.    Historical Provider, MD    Allergies  Allergen Reactions  . Codeine Other (See Comments)    Causes side effects  . Penicillin G Rash  . Sulfa Antibiotics Rash    Family History  Problem Relation Age of Onset  . Heart attack Mother   . Heart attack Father   . Hypertension    . Kidney disease Neg Hx   . Prostate cancer Neg Hx     Social  History Social History  Substance Use Topics  . Smoking status: Never Smoker  . Smokeless tobacco: Never Used  . Alcohol use No    Review of Systems  Constitutional: Negative for fever. Eyes: Negative for visual changes. ENT: Negative for sore throat. Cardiovascular: Negative for chest pain. Respiratory: Negative for shortness of breath. Gastrointestinal: Negative for diarrhea. Genitourinary: Negative for dysuria. Musculoskeletal: Negative for back pain. Skin: Negative for rash. Neurological: Negative for headache. 10 point Review of Systems otherwise negative ____________________________________________   PHYSICAL EXAM:  VITAL SIGNS: ED Triage Vitals [04/12/16 1652]  Enc  Vitals Group     BP 138/80     Pulse Rate 88     Resp 20     Temp 98 F (36.7 C)     Temp Source Oral     SpO2 96 %     Weight 156 lb (70.8 kg)     Height 5\' 6"  (1.676 m)     Head Circumference      Peak Flow      Pain Score      Pain Loc      Pain Edu?      Excl. in GC?      Constitutional: Alert and Cooperative. Well appearing and in no distress. HEENT   Head: Normocephalic and atraumatic.      Eyes: Conjunctivae are normal. PERRL. Normal extraocular movements.      Ears:         Nose: No congestion/rhinnorhea.   Mouth/Throat: Mucous membranes are mildly dry.   Neck: No stridor. Cardiovascular/Chest: Tachycardic and irregularly irregular.  No murmurs, rubs, or gallops. Respiratory: Normal respiratory effort without tachypnea nor retractions. Breath sounds are clear and equal bilaterally. No wheezes/rales/rhonchi. Gastrointestinal: Soft. No distention, no guarding, no rebound. Nontender.    Genitourinary/rectal:Deferred Musculoskeletal: Nontender with normal range of motion in all extremities. No joint effusions.  No lower extremity tenderness.  No edema. Neurologic:  No facial droop. No slurred speech. Disoriented to time. No gross or focal neurologic deficits are appreciated. Skin:  Skin is warm, dry and intact. No rash noted. Psychiatric: No agitation.   ____________________________________________  LABS (pertinent positives/negatives)  Labs Reviewed  COMPREHENSIVE METABOLIC PANEL - Abnormal; Notable for the following:       Result Value   Sodium 134 (*)    Chloride 97 (*)    Glucose, Bld 191 (*)    BUN 22 (*)    Creatinine, Ser 1.50 (*)    Total Bilirubin 2.1 (*)    GFR calc non Af Amer 42 (*)    GFR calc Af Amer 49 (*)    All other components within normal limits  CBC - Abnormal; Notable for the following:    WBC 26.4 (*)    All other components within normal limits  LACTIC ACID, PLASMA - Abnormal; Notable for the following:    Lactic Acid,  Venous 2.4 (*)    All other components within normal limits  DIFFERENTIAL - Abnormal; Notable for the following:    Neutro Abs 24.9 (*)    Lymphs Abs 0.3 (*)    All other components within normal limits  CULTURE, BLOOD (ROUTINE X 2)  CULTURE, BLOOD (ROUTINE X 2)  TROPONIN I  URINALYSIS, COMPLETE (UACMP) WITH MICROSCOPIC  LACTIC ACID, PLASMA  VANCOMYCIN, TROUGH  CBG MONITORING, ED    ____________________________________________    EKG I, Governor Rooks, MD, the attending physician have personally viewed and interpreted all ECGs.  109 bpm. Atrial fibrillation with  rapid ventricular response. Nonspecific intraventricular conduction delay. Normal axis. Nonspecific ST and T-wave ____________________________________________  RADIOLOGY All Xrays were viewed by me. Imaging interpreted by Radiologist.  Chest x-ray:   IMPRESSION: Cardiomegaly.  No active lung disease.  CT head without contrast: IMPRESSION: 1. No acute intracranial abnormality. 2. Chronic microvascular disease and brain atrophy.  Renal ultrasound: IMPRESSION: 1. Mild right pelviectasis. 2. Left kidney interpolar 5 mm stone.  No left hydronephrosis. 3. Left ureteral jet seen. Partially visualized stent within the bladder. Right ureteral jet not seen.  __________________________________________  PROCEDURES  Procedure(s) performed: None  Critical Care performed: CRITICAL CARE Performed by: Governor Rooks   Total critical care time: 30 minutes  Critical care time was exclusive of separately billable procedures and treating other patients.  Critical care was necessary to treat or prevent imminent or life-threatening deterioration.  Critical care was time spent personally by me on the following activities: development of treatment plan with patient and/or surrogate as well as nursing, discussions with consultants, evaluation of patient's response to treatment, examination of patient, obtaining history from  patient or surrogate, ordering and performing treatments and interventions, ordering and review of laboratory studies, ordering and review of radiographic studies, pulse oximetry and re-evaluation of patient's condition.   ____________________________________________   ED COURSE / ASSESSMENT AND PLAN  Pertinent labs & imaging results that were available during my care of the patient were reviewed by me and considered in my medical decision making (see chart for details).   Mr. Puig was brought in by his significant other for nausea after lithotripsy yesterday.  Initially complained of nausea felt like could be potentially due to postprocedure, or tramadol side effect, however patient also noted to have slightly altered mental status per the family although occasionally because the dementia he does have these were his altered.  Blood work shows concerning elevated white blood cell count with left shift. At this point I sent blood cultures and place her penicillin allergic sepsis of uncertain source antibiotics.  Patient received IV fluids. No hypotension.  Chest x-ray clear. Head CT without acute finding.  Urinalysis is actually pending still.  Discussed with hospitalist for admission.  Renal ultrasound did not show acute obstructive problem, and I reviewed the urologist procedure note, Dr. Apolinar Junes from yesterday showing retrieval of stone, and placement of left ureteral stent with resolution of the right ureter issues.  I don't see any emergency surgical urologic need at this point.  Admitted to hospitalist for ongoing treatment of likely sepsis, undetermined source.   CONSULTATIONS:   Hospitalist for admission.   Patient / Family / Caregiver informed of clinical course, medical decision-making process, and agree with plan.  __________________________________________   FINAL CLINICAL IMPRESSION(S) / ED DIAGNOSES   Final diagnoses:  Nausea  Sepsis, due to unspecified  organism [facility])              Note: This dictation was prepared with Dragon dictation. Any transcriptional errors that result from this process are unintentional    Governor Rooks, MD 04/12/16 2122

## 2016-04-12 NOTE — Telephone Encounter (Signed)
Pt caregiver called stating pt is having severe nausea post surgery. Dr. Apolinar JunesBrandon removed a kidney stone yesterday. Caregiver requested nausea medication. Please advise.

## 2016-04-12 NOTE — H&P (Signed)
[facility] [place] [facility]   PATIENT NAME: William Lozano    MR#:  [id number]  DATE OF BIRTH:  Jul 31, 1932  DATE OF ADMISSION:  04/12/2016  PRIMARY CARE PHYSICIAN: Marguarite Arbour, MD   REQUESTING/REFERRING PHYSICIAN: Shaune Pollack, MD  CHIEF COMPLAINT:   Chief Complaint  Patient presents with  . Nausea  . Altered Mental Status    HISTORY OF PRESENT ILLNESS:  William Lozano  is a 83 y.o. male who presents with Nausea and vomiting, increased confusion. Patient has had recent procedures done for obstructing kidney stones, as well as recently being treated for UTI. Today he felt very sick, was febrile, and so came into the ED for evaluation. Here he was found to meet sepsis criteria. Strongest suspicion is for urinary source. Hospitalists were called for admission  PAST MEDICAL HISTORY:   Past Medical History:  Diagnosis Date  . Arthritis   . Atrial fibrillation (HCC) 2016   paroxysmal atrial fibrillation, sick sinus syndrome  . Complication of anesthesia    affects memory for a longer time after  . Dementia    alzheimers  . Essential hypertension   . GERD (gastroesophageal reflux disease)   . History of kidney stones   . Hyperlipemia   . Inguinal hernia recurrent bilateral   . Kidney stones     PAST SURGICAL HISTORY:   Past Surgical History:  Procedure Laterality Date  . CYSTOSCOPY W/ RETROGRADES Bilateral 03/27/2016   Procedure: CYSTOSCOPY WITH RETROGRADE PYELOGRAM;  Surgeon: Vanna Scotland, MD;  Location: [facility];  Service: Urology;  Laterality: Bilateral;  . CYSTOSCOPY W/ URETERAL STENT PLACEMENT Bilateral 02/24/2015   Procedure: CYSTOSCOPY WITH STENT REPLACEMENT;  Surgeon: Hildred Laser, MD;  Location: [facility];  Service: Urology;  Laterality: Bilateral;  . CYSTOSCOPY W/ URETERAL STENT PLACEMENT Left 04/11/2016   Procedure: CYSTOSCOPY WITH STENT REPLACEMENT;  Surgeon: Vanna Scotland, MD;  Location: [facility];  Service: Urology;  Laterality:  Left;  . CYSTOSCOPY W/ URETERAL STENT REMOVAL Right 04/11/2016   Procedure: CYSTOSCOPY WITH STENT REMOVAL;  Surgeon: Vanna Scotland, MD;  Location: [facility];  Service: Urology;  Laterality: Right;  . CYSTOSCOPY WITH RETROGRADE PYELOGRAM, URETEROSCOPY AND STENT PLACEMENT Bilateral 12/10/2014   Procedure: CYSTOSCOPY WITH RETROGRADE PYELOGRAM, URETEROSCOPY AND STENT PLACEMENT;  Surgeon: Hildred Laser, MD;  Location: [facility];  Service: Urology;  Laterality: Bilateral;  . CYSTOSCOPY WITH STENT PLACEMENT Bilateral 12/10/2014   Procedure: CYSTOSCOPY WITH STENT PLACEMENT;  Surgeon: Hildred Laser, MD;  Location: [facility];  Service: Urology;  Laterality: Bilateral;  . CYSTOSCOPY WITH STENT PLACEMENT Bilateral 03/27/2016   Procedure: CYSTOSCOPY WITH STENT PLACEMENT;  Surgeon: Vanna Scotland, MD;  Location: [facility];  Service: Urology;  Laterality: Bilateral;  . EXTRACORPOREAL SHOCK WAVE LITHOTRIPSY Right 01/07/2015   Procedure: EXTRACORPOREAL SHOCK WAVE LITHOTRIPSY (ESWL);  Surgeon: Lorraine Lax, MD;  Location: [facility];  Service: Urology;  Laterality: Right;  . EXTRACORPOREAL SHOCK WAVE LITHOTRIPSY Left 01/21/2015   Procedure: EXTRACORPOREAL SHOCK WAVE LITHOTRIPSY (ESWL);  Surgeon: Hildred Laser, MD;  Location: [facility];  Service: Urology;  Laterality: Left;  . EYE SURGERY Bilateral    Cataract Extraction with IOL  . HERNIA REPAIR Bilateral    inguinal  . URETEROSCOPY WITH HOLMIUM LASER LITHOTRIPSY Bilateral 02/24/2015   Procedure: URETEROSCOPY WITH HOLMIUM LASER LITHOTRIPSY /STONE BASKETING;  Surgeon: Hildred Laser, MD;  Location: [facility];  Service: Urology;  Laterality: Bilateral;  . URETEROSCOPY WITH HOLMIUM LASER LITHOTRIPSY Bilateral 04/11/2016   Procedure: URETEROSCOPY  WITH HOLMIUM LASER LITHOTRIPSY;  Surgeon: Vanna Scotland, MD;  Location: [facility];  Service: Urology;  Laterality: Bilateral;    SOCIAL HISTORY:   Social History  Substance Use Topics  . Smoking status: Never  Smoker  . Smokeless tobacco: Never Used  . Alcohol use No    FAMILY HISTORY:   Family History  Problem Relation Age of Onset  . Heart attack Mother   . Heart attack Father   . Hypertension    . Kidney disease Neg Hx   . Prostate cancer Neg Hx     DRUG ALLERGIES:   Allergies  Allergen Reactions  . Codeine Other (See Comments)    Causes side effects  . Penicillin G Rash  . Sulfa Antibiotics Rash    MEDICATIONS AT HOME:   Prior to Admission medications   Medication Sig Start Date End Date Taking? Authorizing Provider  hydrochlorothiazide (HYDRODIURIL) 25 MG tablet Take 25 mg by mouth daily.   Yes Historical Provider, MD  HYDROcodone-acetaminophen (NORCO/VICODIN) 5-325 MG tablet Take 1-2 tablets by mouth every 6 (six) hours as needed for moderate pain. 04/11/16  Yes Vanna Scotland, MD  metoprolol succinate (TOPROL-XL) 25 MG 24 hr tablet Take 25 mg by mouth daily.   Yes Historical Provider, MD  ondansetron (ZOFRAN ODT) 4 MG disintegrating tablet Take 1 tablet (4 mg total) by mouth every 4 (four) hours as needed for nausea or vomiting. 04/12/16  Yes Shannon A McGowan, PA-C  pantoprazole (PROTONIX) 40 MG tablet Take 40 mg by mouth daily.   Yes Historical Provider, MD  rivastigmine (EXELON) 1.5 MG capsule Take 1.5 mg by mouth 2 (two) times daily.   Yes Historical Provider, MD  simvastatin (ZOCOR) 20 MG tablet Take 20 mg by mouth daily.   Yes Historical Provider, MD  traMADol (ULTRAM) 50 MG tablet Take 1 tablet (50 mg total) by mouth every 6 (six) hours as needed for moderate pain. 03/28/16  Yes Katharina Caper, MD  apixaban (ELIQUIS) 5 MG TABS tablet Take 5 mg by mouth 2 (two) times daily.    Historical Provider, MD    REVIEW OF SYSTEMS:  Review of Systems  Constitutional: Positive for fever and malaise/fatigue. Negative for chills and weight loss.  HENT: Negative for ear pain, hearing loss and tinnitus.   Eyes: Negative for blurred vision, double vision, pain and redness.   Respiratory: Negative for cough, hemoptysis and shortness of breath.   Cardiovascular: Negative for chest pain, palpitations, orthopnea and leg swelling.  Gastrointestinal: Positive for abdominal pain, nausea and vomiting. Negative for constipation and diarrhea.  Genitourinary: Negative for dysuria, frequency and hematuria.  Musculoskeletal: Negative for back pain, joint pain and neck pain.  Skin:       No acne, rash, or lesions  Neurological: Negative for dizziness, tremors, focal weakness and weakness.  Endo/Heme/Allergies: Negative for polydipsia. Does not bruise/bleed easily.  Psychiatric/Behavioral: Negative for depression. The patient is not nervous/anxious and does not have insomnia.      VITAL SIGNS:   Vitals:   04/12/16 1652 04/12/16 1749 04/12/16 1930 04/12/16 2030  BP: 138/80  116/78 119/88  Pulse: 88  (!) 122   Resp: 20   16  Temp: 98 F (36.7 C) (!) 100.6 F (38.1 C)  98.2 F (36.8 C)  TempSrc: Oral Oral  Oral  SpO2: 96%  96%   Weight: 70.8 kg (156 lb)     Height: 5\' 6"  (1.676 m)      Wt Readings from Last 3 Encounters:  04/12/16 70.8 kg (156 lb)  04/11/16 70.8 kg (156 lb)  04/06/16 70.8 kg (156 lb)    PHYSICAL EXAMINATION:  Physical Exam  Vitals reviewed. Constitutional: He is oriented to person, place, and time. He appears well-developed and well-nourished. No distress.  HENT:  Head: Normocephalic and atraumatic.  Mouth/Throat: Oropharynx is clear and moist.  Eyes: Conjunctivae and EOM are normal. Pupils are equal, round, and reactive to light. No scleral icterus.  Neck: Normal range of motion. Neck supple. No JVD present. No thyromegaly present.  Cardiovascular: Normal rate, regular rhythm and intact distal pulses.  Exam reveals no gallop and no friction rub.   No murmur heard. Respiratory: Effort normal and breath sounds normal. No respiratory distress. He has no wheezes. He has no rales.  GI: Soft. Bowel sounds are normal. He exhibits no distension.  There is no tenderness.  Musculoskeletal: Normal range of motion. He exhibits no edema.  No arthritis, no gout  Lymphadenopathy:    He has no cervical adenopathy.  Neurological: He is alert and oriented to person, place, and time. No cranial nerve deficit.  No dysarthria, no aphasia  Skin: Skin is warm and dry. No rash noted. No erythema.  Psychiatric: He has a normal mood and affect. His behavior is normal. Judgment and thought content normal.    LABORATORY PANEL:   CBC  Recent Labs Lab 04/12/16 1653  WBC 26.4*  HGB 17.1  HCT 49.6  PLT 305   ------------------------------------------------------------------------------------------------------------------  Chemistries   Recent Labs Lab 04/12/16 1653  NA 134*  K 4.0  CL 97*  CO2 27  GLUCOSE 191*  BUN 22*  CREATININE 1.50*  CALCIUM 9.6  AST 28  ALT 17  ALKPHOS 93  BILITOT 2.1*   ------------------------------------------------------------------------------------------------------------------  Cardiac Enzymes  Recent Labs Lab 04/12/16 1653  TROPONINI <0.03   ------------------------------------------------------------------------------------------------------------------  RADIOLOGY:  Ct Head Wo Contrast  Result Date: 04/12/2016 CLINICAL DATA:  Nausea. Dizziness and disorientation. Recent lithotripsy. EXAM: CT HEAD WITHOUT CONTRAST TECHNIQUE: Contiguous axial images were obtained from the base of the skull through the vertex without intravenous contrast. COMPARISON:  None. FINDINGS: Brain: No evidence of acute infarction, hemorrhage, hydrocephalus, extra-axial collection or mass lesion/mass effect. Prominence of the sulci and ventricles identified compatible with brain atrophy. Low attenuation within the subcortical and periventricular white matter noted compatible with chronic microvascular disease. Vascular: No hyperdense vessel or unexpected calcification. Skull: Normal. Negative for fracture or focal lesion.  Sinuses/Orbits: No acute finding. Other: None. IMPRESSION: 1. No acute intracranial abnormality. 2. Chronic microvascular disease and brain atrophy. Electronically Signed   By: Signa Kell M.D.   On: 04/12/2016 19:15   US Renal  Result Date: 04/12/2016 CLINICAL DATA:  83 y/o M; light foot should see dense yesterday presenting with nausea. EXAM: RENAL / URINARY TRACT ULTRASOUND COMPLETE COMPARISON:  None. FINDINGS: Right Kidney: Length: 10.9 cm.  No mass or stone identified.  Mild pelviectasis. Left Kidney: Length: 10.6 cm. 4 mm echogenic focus within the interpolar kidney likely representing a stone. No hydronephrosis. Bladder: Partially visualize stent within the bladder. Right ureteral jet not seen. Left ureteral jet seen. Prostate volume 28 cm3. IMPRESSION: 1. Mild right pelviectasis. 2. Left kidney interpolar 5 mm stone.  No left hydronephrosis. 3. Left ureteral jet seen. Partially visualized stent within the bladder. Right ureteral jet not seen. Electronically Signed   By: Mitzi Hansen M.D.   On: 04/12/2016 19:35   Dg Chest Port 1 View  Result Date: 04/12/2016 CLINICAL DATA:  Confusion,  nausea, and dizziness beginning today. EXAM: PORTABLE CHEST 1 VIEW COMPARISON:  None. FINDINGS: Mild cardiomegaly. Aortic atherosclerosis. No evidence of pulmonary infiltrate or edema. No evidence of pleural effusion or pneumothorax. IMPRESSION: Cardiomegaly.  No active lung disease. Electronically Signed   By: Myles RosenthalJohn  Stahl M.D.   On: 04/12/2016 18:16    EKG:   Orders placed or performed during the hospital encounter of 04/12/16  . EKG 12-Lead  . EKG 12-Lead    IMPRESSION AND PLAN:  Principal Problem:   Sepsis (HCC) - after initial fluid administration the ED patient is symptomatically much improved. Broad-spectrum antibiotics given, lactic acid was elevated, fluids will be given for resuscitation and lactate followed until within normal limits. Cultures sent Active Problems:   UTI - with  recent instrumentation, and also recent infection, it is unclear whether this is persistence of the same infection or any infection. Antibiotics as above with cultures sent from the ED.   Atrial fibrillation (HCC) - continue home meds   Calculus of kidney - ultrasound done and patient has 1 remaining small kidney stone in his left kidney, with no evidence of obstruction. Urology consulted   BP (high blood pressure) - blood pressure has been low end normal, will hold antihypertensives for now  All the records are reviewed and case discussed with ED provider. Management plans discussed with the patient and/or family.  DVT PROPHYLAXIS: SubQ lovenox  GI PROPHYLAXIS: PPI  ADMISSION STATUS: Inpatient  CODE STATUS: Full Code Status History    Date Active Date Inactive Code Status Order ID Comments User Context   03/27/2016  5:09 PM 03/28/2016 10:09 PM Full Code [id number]  Ramonita LabAruna Gouru, MD Inpatient   02/24/2015  3:23 PM 02/25/2015  5:43 PM Full Code [id number]  Katharina Caperima Vaickute, MD Inpatient   12/10/2014  6:46 AM 12/14/2014  2:16 PM Full Code [id number]  Arnaldo NatalMichael S Diamond, MD Inpatient    Advance Directive Documentation   Flowsheet Row Most Recent Value  Type of Advance Directive  Healthcare Power of Attorney, Living will  Pre-existing out of facility DNR order (yellow form or pink MOST form)  No data  "MOST" Form in Place?  No data      TOTAL TIME TAKING CARE OF THIS PATIENT: 45 minutes.    Nahuel Wilbert FIELDING 04/12/2016, 9:29 PM  [facility]  Office  [phone]  CC: Primary care physician; Marguarite ArbourSPARKS,JEFFREY D, MD

## 2016-04-12 NOTE — ED Notes (Signed)
Patient assisted to use urinal. Brief and bed pad changed.

## 2016-04-12 NOTE — Progress Notes (Addendum)
ANTIBIOTIC CONSULT NOTE - INITIAL  Pharmacy Consult for Vancomycin/aztreonam/levaquin dosing Indication: sepsis  Allergies  Allergen Reactions  . Codeine Other (See Comments)    Causes side effects  . Penicillin G Rash  . Sulfa Antibiotics Rash    Patient Measurements: Height: 5\' 6"  (167.6 cm) Weight: 156 lb (70.8 kg) IBW/kg (Calculated) : 63.8 Adjusted Body Weight: 63 kg  Vital Signs: Temp: 100.6 F (38.1 C) (02/21 1749) Temp Source: Oral (02/21 1749) BP: 138/80 (02/21 1652) Pulse Rate: 88 (02/21 1652) Intake/Output from previous day: No intake/output data recorded. Intake/Output from this shift: Total I/O In: 500 [IV Piggyback:500] Out: -   Labs:  Recent Labs  04/12/16 1653  WBC 26.4*  HGB 17.1  PLT 305  CREATININE 1.50*   Estimated Creatinine Clearance: 34.9 mL/min (by C-G formula based on SCr of 1.5 mg/dL (H)). No results for input(s): VANCOTROUGH, VANCOPEAK, VANCORANDOM, GENTTROUGH, GENTPEAK, GENTRANDOM, TOBRATROUGH, TOBRAPEAK, TOBRARND, AMIKACINPEAK, AMIKACINTROU, AMIKACIN in the last 72 hours.   Microbiology: Recent Results (from the past 720 hour(s))  Urine culture     Status: None   Collection Time: 03/27/16 11:00 AM  Result Value Ref Range Status   Specimen Description URINE, CLEAN CATCH  Final   Special Requests NONE  Final   Culture   Final    NO GROWTH Performed at [facility], [address] [place], [place] [address]    Report Status 03/29/2016 FINAL  Final  Microscopic Examination     Status: Abnormal   Collection Time: 04/06/16 10:35 AM  Result Value Ref Range Status   WBC, UA 0-5 0 - 5 /hpf Final   RBC, UA >30 (A) 0 - 2 /hpf Final   Epithelial Cells (non renal) 0-10 0 - 10 /hpf Final   Bacteria, UA None seen None seen/Few Final  CULTURE, URINE COMPREHENSIVE     Status: None   Collection Time: 04/06/16 11:16 AM  Result Value Ref Range Status   Urine Culture, Comprehensive Final report  Final   Result 1 Comment  Final   Comment: Mixed urogenital flora 2,000 Colonies/mL     Medical History: Past Medical History:  Diagnosis Date  . Arthritis   . Atrial fibrillation (HCC) 2016   paroxysmal atrial fibrillation, sick sinus syndrome  . Complication of anesthesia    affects memory for a longer time after  . Dementia    alzheimers  . Essential hypertension   . GERD (gastroesophageal reflux disease)   . History of kidney stones   . Hyperlipemia   . Inguinal hernia recurrent bilateral   . Kidney stones     Medications:  Scheduled:   Assessment: Patient admitted as code sepsis is being start on 3 empiric abx: Vancomycin, aztreonam (pt has PCN allx), and levaquin Pt has h/o urosepsis s/p two ureteral stents placed.  Goal of Therapy:  Resolution of signs/symptoms of SIRs/sepsis Vancomycin trough level 15-20 mcg/ml  Plan:  Will give a 1.5g dose of Vanc IV x 1 (21 mg/kg load based on TBW) in the ED and will check a random vanc level 2/21 w/ am labs as patient appears to be recovering from an AKI (appears to be acute on chronic). Will re-dose vanc if random level < 20 mg/L. Will initiate Aztreonam 1g q8h Levaquin 750 mg q48h per renal function. Follow up culture results  Thank you for this consult.  Thomasene Rippleavid Kveon Casanas, PharmD, BCPS Clinical Pharmacist 04/12/2016

## 2016-04-12 NOTE — ED Triage Notes (Signed)
Pt had lithotripsy yesterday, pt has had nausea , no vomiting, family reports confusion , pt has been taking tramadol for pain, pt alert to person and place, pt denies pain

## 2016-04-12 NOTE — ED Notes (Signed)
Brief changed and patient repositioned.

## 2016-04-12 NOTE — Telephone Encounter (Signed)
Spoke with pt wife in reference to zofran. Wife voiced understanding.

## 2016-04-12 NOTE — Telephone Encounter (Signed)
We will call in Zofran 4 mg ODT, q 4 hours prn for nausea.  # 30.

## 2016-04-13 ENCOUNTER — Inpatient Hospital Stay: Payer: Medicare Other

## 2016-04-13 DIAGNOSIS — A419 Sepsis, unspecified organism: Secondary | ICD-10-CM

## 2016-04-13 DIAGNOSIS — N200 Calculus of kidney: Secondary | ICD-10-CM

## 2016-04-13 DIAGNOSIS — D72829 Elevated white blood cell count, unspecified: Secondary | ICD-10-CM

## 2016-04-13 LAB — BASIC METABOLIC PANEL
ANION GAP: 8 (ref 5–15)
BUN: 23 mg/dL — ABNORMAL HIGH (ref 6–20)
CALCIUM: 8.3 mg/dL — AB (ref 8.9–10.3)
CO2: 25 mmol/L (ref 22–32)
Chloride: 106 mmol/L (ref 101–111)
Creatinine, Ser: 1.53 mg/dL — ABNORMAL HIGH (ref 0.61–1.24)
GFR calc Af Amer: 47 mL/min — ABNORMAL LOW (ref >60)
GFR calc non Af Amer: 41 mL/min — ABNORMAL LOW (ref >60)
GLUCOSE: 188 mg/dL — AB (ref 65–99)
Potassium: 5 mmol/L (ref 3.5–5.1)
Sodium: 139 mmol/L (ref 135–145)

## 2016-04-13 LAB — CBC
HEMATOCRIT: 44.7 % (ref 40.0–52.0)
HEMOGLOBIN: 15 g/dL (ref 13.0–18.0)
MCH: 30.9 pg (ref 26.0–34.0)
MCHC: 33.6 g/dL (ref 32.0–36.0)
MCV: 91.9 fL (ref 80.0–100.0)
Platelets: 260 10*3/uL (ref 150–440)
RBC: 4.86 MIL/uL (ref 4.40–5.90)
RDW: 14.1 % (ref 11.5–14.5)
WBC: 34.4 10*3/uL — ABNORMAL HIGH (ref 3.8–10.6)

## 2016-04-13 LAB — VANCOMYCIN, TROUGH: VANCOMYCIN TR: 17 ug/mL (ref 15–20)

## 2016-04-13 LAB — MRSA PCR SCREENING: MRSA BY PCR: NEGATIVE

## 2016-04-13 LAB — LACTIC ACID, PLASMA: Lactic Acid, Venous: 3.5 mmol/L (ref 0.5–1.9)

## 2016-04-13 MED ORDER — VANCOMYCIN HCL IN DEXTROSE 1-5 GM/200ML-% IV SOLN
1000.0000 mg | INTRAVENOUS | Status: DC
  Administered 2016-04-13 – 2016-04-14 (×2): 1000 mg via INTRAVENOUS
  Filled 2016-04-13 (×2): qty 200

## 2016-04-13 MED ORDER — MEROPENEM-SODIUM CHLORIDE 1 GM/50ML IV SOLR
1.0000 g | Freq: Two times a day (BID) | INTRAVENOUS | Status: DC
  Administered 2016-04-13 – 2016-04-15 (×4): 1 g via INTRAVENOUS
  Filled 2016-04-13 (×5): qty 50

## 2016-04-13 MED ORDER — SODIUM CHLORIDE 0.9 % IV SOLN
1.0000 g | Freq: Two times a day (BID) | INTRAVENOUS | Status: DC
  Filled 2016-04-13: qty 1

## 2016-04-13 MED ORDER — SODIUM CHLORIDE 0.9 % IV SOLN
INTRAVENOUS | Status: AC
  Administered 2016-04-13 (×2): via INTRAVENOUS

## 2016-04-13 MED ORDER — APIXABAN 2.5 MG PO TABS
2.5000 mg | ORAL_TABLET | Freq: Two times a day (BID) | ORAL | Status: DC
  Administered 2016-04-13 – 2016-04-15 (×6): 2.5 mg via ORAL
  Filled 2016-04-13 (×6): qty 1

## 2016-04-13 NOTE — Progress Notes (Signed)
Eliquis changed to 2.5 mg BID for patient 4>80 y/o and SCr >= 1.5.

## 2016-04-13 NOTE — Progress Notes (Signed)
ANTIBIOTIC CONSULT NOTE - INITIAL  Pharmacy Consult for Vancomycin/aztreonam/levaquin dosing Indication: sepsis  Allergies  Allergen Reactions  . Codeine Other (See Comments)    Causes side effects  . Penicillin G Rash  . Sulfa Antibiotics Rash    Patient Measurements: Height: 5\' 6"  (167.6 cm) Weight: 156 lb 8 oz (71 kg) IBW/kg (Calculated) : 63.8 Adjusted Body Weight: 63 kg  Vital Signs: Temp: 99.4 F (37.4 C) (02/21 2331) Temp Source: Oral (02/21 2331) BP: 104/57 (02/21 2331) Pulse Rate: 101 (02/21 2331) Intake/Output from previous day: 02/21 0701 - 02/22 0700 In: 1150 [I.V.:500; IV Piggyback:650] Out: 0  Intake/Output from this shift: Total I/O In: 650 [I.V.:500; IV Piggyback:150] Out: 0   Labs:  Recent Labs  04/12/16 1653 04/13/16 0243  WBC 26.4* 34.4*  HGB 17.1 15.0  PLT 305 260  CREATININE 1.50* 1.53*   Estimated Creatinine Clearance: 34.2 mL/min (by C-G formula based on SCr of 1.53 mg/dL (H)).  Recent Labs  04/13/16 0243  [facility] 17     Microbiology: Recent Results (from the past 720 hour(s))  Urine culture     Status: None   Collection Time: 03/27/16 11:00 AM  Result Value Ref Range Status   Specimen Description URINE, CLEAN CATCH  Final   Special Requests NONE  Final   Culture   Final    NO GROWTH Performed at [facility], [address], [place], [place] [address]    Report Status 03/29/2016 FINAL  Final  Microscopic Examination     Status: Abnormal   Collection Time: 04/06/16 10:35 AM  Result Value Ref Range Status   WBC, UA 0-5 0 - 5 /hpf Final   RBC, UA >30 (A) 0 - 2 /hpf Final   Epithelial Cells (non renal) 0-10 0 - 10 /hpf Final   Bacteria, UA None seen None seen/Few Final  CULTURE, URINE COMPREHENSIVE     Status: None   Collection Time: 04/06/16 11:16 AM  Result Value Ref Range Status   Urine Culture, Comprehensive Final report  Final   Result 1 Comment  Final    Comment: Mixed urogenital flora 2,000  Colonies/mL     Medical History: Past Medical History:  Diagnosis Date  . Arthritis   . Atrial fibrillation (HCC) 2016   paroxysmal atrial fibrillation, sick sinus syndrome  . Complication of anesthesia    affects memory for a longer time after  . Dementia    alzheimers  . Essential hypertension   . GERD (gastroesophageal reflux disease)   . History of kidney stones   . Hyperlipemia   . Inguinal hernia recurrent bilateral   . Kidney stones     Medications:  Scheduled:  . apixaban  2.5 mg Oral BID  . aztreonam  1 g Intravenous Q8H  . levofloxacin (LEVAQUIN) IV  750 mg Intravenous Q48H  . metoprolol succinate  25 mg Oral Daily  . pantoprazole  40 mg Oral Daily  . rivastigmine  1.5 mg Oral BID  . simvastatin  20 mg Oral Daily  . sodium chloride flush  3 mL Intravenous Q12H  . vancomycin  1,000 mg Intravenous Q24H   Assessment: Patient admitted as code sepsis is being start on 3 empiric abx: Vancomycin, aztreonam (pt has PCN allx), and levaquin Pt has h/o urosepsis s/p two ureteral stents placed.  Goal of Therapy:  Resolution of signs/symptoms of SIRs/sepsis Vancomycin trough level 15-20 mcg/ml  Plan:  Will give a 1.5g dose of Vanc IV x 1 (21  mg/kg load based on TBW) in the ED and will check a random vanc level 2/21 w/ am labs as patient appears to be recovering from an AKI (appears to be acute on chronic). Will re-dose vanc if random level < 20 mg/L. Will initiate Aztreonam 1g q8h Levaquin 750 mg q48h per renal function. Follow up culture results  2/22 AM vanc level 17. Will order 1 gram q 24 hours. Level before 4th overall dose.  Thank you for this consult.  Thomasene Rippleavid Besanti, PharmD, BCPS Clinical Pharmacist 04/13/2016

## 2016-04-13 NOTE — Progress Notes (Signed)
MD Sheryle Hailiamond made aware of BP 83/60. No new orders at this time.   Mayra NeerNesbitt, Noam Franzen M

## 2016-04-13 NOTE — Consult Note (Signed)
Reason for Consult: Nephrolithiasis, Urosepsis  Referring Physician: Demetrios Loll MD  William Lozano Sr. is an 83 y.o. male.   HPI:   1 - Nephrolithiasis - s/p bilateral ureteroscopy with left laser lithotripsy to stone free 04/11/16 for bilateral nephrolithiasis. Left JJ stent in good position by KUB / [place] on admission 2/22. Small left parenchyamal stone on [place] (not intraluminal) only.   2 - Urosepsis  - severe leukocytosis to 30k, bacteruria, and altered mental status on admit 2/22 c/w urosepsis. Most recent Pre-op UCX's negative. UCX 2/22 penidng. [place] 2/22 pending. Placed on empirit aztreonam.  Today "Fleek" is seen in consultation for above. He reports much less malaise than on admit.   Past Medical History:  Diagnosis Date  . Arthritis   . Atrial fibrillation ([place]) 2016   paroxysmal atrial fibrillation, sick sinus syndrome  . Complication of anesthesia    affects memory for a longer time after  . Dementia    alzheimers  . Essential hypertension   . GERD (gastroesophageal reflux disease)   . History of kidney stones   . Hyperlipemia   . Inguinal hernia recurrent bilateral   . Kidney stones     Past Surgical History:  Procedure Laterality Date  . CYSTOSCOPY W/ RETROGRADES Bilateral 03/27/2016   Procedure: CYSTOSCOPY WITH RETROGRADE PYELOGRAM;  Surgeon: Hollice Espy, MD;  Location: [facility];  Service: Urology;  Laterality: Bilateral;  . CYSTOSCOPY W/ URETERAL STENT PLACEMENT Bilateral 02/24/2015   Procedure: CYSTOSCOPY WITH STENT REPLACEMENT;  Surgeon: Nickie Retort, MD;  Location: [facility];  Service: Urology;  Laterality: Bilateral;  . CYSTOSCOPY W/ URETERAL STENT PLACEMENT Left 04/11/2016   Procedure: CYSTOSCOPY WITH STENT REPLACEMENT;  Surgeon: Hollice Espy, MD;  Location: [facility];  Service: Urology;  Laterality: Left;  . CYSTOSCOPY W/ URETERAL STENT REMOVAL Right 04/11/2016   Procedure: CYSTOSCOPY WITH STENT REMOVAL;  Surgeon: Hollice Espy, MD;  Location: [facility];   Service: Urology;  Laterality: Right;  . CYSTOSCOPY WITH RETROGRADE PYELOGRAM, URETEROSCOPY AND STENT PLACEMENT Bilateral 12/10/2014   Procedure: CYSTOSCOPY WITH RETROGRADE PYELOGRAM, URETEROSCOPY AND STENT PLACEMENT;  Surgeon: Nickie Retort, MD;  Location: [facility];  Service: Urology;  Laterality: Bilateral;  . CYSTOSCOPY WITH STENT PLACEMENT Bilateral 12/10/2014   Procedure: CYSTOSCOPY WITH STENT PLACEMENT;  Surgeon: Nickie Retort, MD;  Location: [facility];  Service: Urology;  Laterality: Bilateral;  . CYSTOSCOPY WITH STENT PLACEMENT Bilateral 03/27/2016   Procedure: CYSTOSCOPY WITH STENT PLACEMENT;  Surgeon: Hollice Espy, MD;  Location: [facility];  Service: Urology;  Laterality: Bilateral;  . EXTRACORPOREAL SHOCK WAVE LITHOTRIPSY Right 01/07/2015   Procedure: EXTRACORPOREAL SHOCK WAVE LITHOTRIPSY (ESWL);  Surgeon: Collier Flowers, MD;  Location: [facility];  Service: Urology;  Laterality: Right;  . EXTRACORPOREAL SHOCK WAVE LITHOTRIPSY Left 01/21/2015   Procedure: EXTRACORPOREAL SHOCK WAVE LITHOTRIPSY (ESWL);  Surgeon: Nickie Retort, MD;  Location: [facility];  Service: Urology;  Laterality: Left;  . EYE SURGERY Bilateral    Cataract Extraction with IOL  . HERNIA REPAIR Bilateral    inguinal  . URETEROSCOPY WITH HOLMIUM LASER LITHOTRIPSY Bilateral 02/24/2015   Procedure: URETEROSCOPY WITH HOLMIUM LASER LITHOTRIPSY /STONE BASKETING;  Surgeon: Nickie Retort, MD;  Location: [facility];  Service: Urology;  Laterality: Bilateral;  . URETEROSCOPY WITH HOLMIUM LASER LITHOTRIPSY Bilateral 04/11/2016   Procedure: URETEROSCOPY WITH HOLMIUM LASER LITHOTRIPSY;  Surgeon: Hollice Espy, MD;  Location: [facility];  Service: Urology;  Laterality: Bilateral;    Family History  Problem Relation Age of Onset  .  Heart attack Mother   . Heart attack Father   . Hypertension    . Kidney disease Neg Hx   . Prostate cancer Neg Hx     Social History:  reports that he has never smoked. He has never used  smokeless tobacco. He reports that he does not drink alcohol or use drugs.  Allergies:  Allergies  Allergen Reactions  . Codeine Other (See Comments)    Causes side effects  . Penicillin G Rash  . Sulfa Antibiotics Rash    Medications: I have reviewed the patient's current medications.  Results for orders placed or performed during the hospital encounter of 04/12/16 (from the past 48 hour(s))  Comprehensive metabolic panel     Status: Abnormal   Collection Time: 04/12/16  4:53 PM  Result Value Ref Range   Sodium 134 (L) 135 - 145 mmol/L   Potassium 4.0 3.5 - 5.1 mmol/L   Chloride 97 (L) 101 - 111 mmol/L   CO2 27 22 - 32 mmol/L   Glucose, Bld 191 (H) 65 - 99 mg/dL   BUN 22 (H) 6 - 20 mg/dL   Creatinine, Ser 1.50 (H) 0.61 - 1.24 mg/dL   Calcium 9.6 8.9 - 10.3 mg/dL   Total Protein 7.4 6.5 - 8.1 g/dL   Albumin 4.5 3.5 - 5.0 g/dL   AST 28 15 - 41 U/L   ALT 17 17 - 63 U/L   Alkaline Phosphatase 93 38 - 126 U/L   Total Bilirubin 2.1 (H) 0.3 - 1.2 mg/dL   GFR calc non Af Amer 42 (L) >60 mL/min   GFR calc Af Amer 49 (L) >60 mL/min    Comment: (NOTE) The eGFR has been calculated using the CKD EPI equation. This calculation has not been validated in all clinical situations. eGFR's persistently <60 mL/min signify possible Chronic Kidney Disease.    Anion gap 10 5 - 15  CBC     Status: Abnormal   Collection Time: 04/12/16  4:53 PM  Result Value Ref Range   WBC 26.4 (H) 3.8 - 10.6 K/uL   RBC 5.48 4.40 - 5.90 MIL/uL   Hemoglobin 17.1 13.0 - 18.0 g/dL   HCT 49.6 40.0 - 52.0 %   MCV 90.4 80.0 - 100.0 fL   MCH 31.2 26.0 - 34.0 pg   MCHC 34.5 32.0 - 36.0 g/dL   RDW 14.3 11.5 - 14.5 %   Platelets 305 150 - 440 K/uL  Troponin I     Status: None   Collection Time: 04/12/16  4:53 PM  Result Value Ref Range   Troponin I <0.03 <0.03 ng/mL  Differential     Status: Abnormal   Collection Time: 04/12/16  4:53 PM  Result Value Ref Range   Neutrophils Relative % 96 %   Neutro Abs 24.9  (H) 1.4 - 6.5 K/uL   Lymphocytes Relative 1 %   Lymphs Abs 0.3 (L) 1.0 - 3.6 K/uL   Monocytes Relative 3 %   Monocytes Absolute 0.8 0.2 - 1.0 K/uL   Eosinophils Relative 0 %   Eosinophils Absolute 0.0 0 - 0.7 K/uL   Basophils Relative 0 %   Basophils Absolute 0.1 0 - 0.1 K/uL  Lactic acid, plasma     Status: Abnormal   Collection Time: 04/12/16  5:25 PM  Result Value Ref Range   Lactic Acid, Venous 2.4 (HH) 0.5 - 1.9 mmol/L    Comment: CRITICAL RESULT CALLED TO, READ BACK BY AND VERIFIED WITH LAURA CATES  04/12/16 @ 1844  MLK   Urinalysis, Complete w Microscopic     Status: Abnormal   Collection Time: 04/12/16  5:39 PM  Result Value Ref Range   Color, Urine YELLOW (A) YELLOW   APPearance CLOUDY (A) CLEAR   Specific Gravity, Urine 1.005 1.005 - 1.030   pH 6.0 5.0 - 8.0   Glucose, UA NEGATIVE NEGATIVE mg/dL   Hgb urine dipstick MODERATE (A) NEGATIVE   Bilirubin Urine NEGATIVE NEGATIVE   Ketones, ur NEGATIVE NEGATIVE mg/dL   Protein, ur 100 (A) NEGATIVE mg/dL   Nitrite NEGATIVE NEGATIVE   Leukocytes, UA LARGE (A) NEGATIVE   RBC / HPF 0-5 0 - 5 RBC/hpf   WBC, UA TOO NUMEROUS TO COUNT 0 - 5 WBC/hpf   Bacteria, UA MANY (A) NONE SEEN   Squamous Epithelial / LPF 0-5 (A) NONE SEEN   WBC Clumps PRESENT    Mucous PRESENT   Culture, blood (routine x 2)     Status: None (Preliminary result)   Collection Time: 04/12/16  5:39 PM  Result Value Ref Range   Specimen Description BLOOD L HAND    Special Requests BOTTLES DRAWN AEROBIC AND ANAEROBIC  BCAV    Culture NO GROWTH < 12 HOURS    Report Status PENDING   Culture, blood (routine x 2)     Status: None (Preliminary result)   Collection Time: 04/12/16  6:18 PM  Result Value Ref Range   Specimen Description BLOOD LEFT HAND    Special Requests BOTTLES DRAWN AEROBIC AND ANAEROBIC BCAV    Culture NO GROWTH < 12 HOURS    Report Status PENDING   Lactic acid, plasma     Status: Abnormal   Collection Time: 04/12/16 10:29 PM  Result Value Ref  Range   Lactic Acid, Venous 2.7 (HH) 0.5 - 1.9 mmol/L    Comment: CRITICAL RESULT CALLED TO, READ BACK BY AND VERIFIED WITH TAKERA NESBITT ON 04/12/16 AT 2309 MNS   Vancomycin, trough     Status: None   Collection Time: 04/13/16  2:43 AM  Result Value Ref Range   Vancomycin Tr 17 15 - 20 ug/mL  Lactic acid, plasma     Status: Abnormal   Collection Time: 04/13/16  2:43 AM  Result Value Ref Range   Lactic Acid, Venous 3.5 (HH) 0.5 - 1.9 mmol/L    Comment: CRITICAL RESULT CALLED TO, READ BACK BY AND VERIFIED WITH TAKEIA NESBITT ON 04/13/16 AT 0326 BY TLB   Basic metabolic panel     Status: Abnormal   Collection Time: 04/13/16  2:43 AM  Result Value Ref Range   Sodium 139 135 - 145 mmol/L   Potassium 5.0 3.5 - 5.1 mmol/L   Chloride 106 101 - 111 mmol/L   CO2 25 22 - 32 mmol/L   Glucose, Bld 188 (H) 65 - 99 mg/dL   BUN 23 (H) 6 - 20 mg/dL   Creatinine, Ser 1.53 (H) 0.61 - 1.24 mg/dL   Calcium 8.3 (L) 8.9 - 10.3 mg/dL   GFR calc non Af Amer 41 (L) >60 mL/min   GFR calc Af Amer 47 (L) >60 mL/min    Comment: (NOTE) The eGFR has been calculated using the CKD EPI equation. This calculation has not been validated in all clinical situations. eGFR's persistently <60 mL/min signify possible Chronic Kidney Disease.    Anion gap 8 5 - 15  CBC     Status: Abnormal   Collection Time: 04/13/16  2:43 AM  Result  Value Ref Range   WBC 34.4 (H) 3.8 - 10.6 K/uL   RBC 4.86 4.40 - 5.90 MIL/uL   Hemoglobin 15.0 13.0 - 18.0 g/dL   HCT 44.7 40.0 - 52.0 %   MCV 91.9 80.0 - 100.0 fL   MCH 30.9 26.0 - 34.0 pg   MCHC 33.6 32.0 - 36.0 g/dL   RDW 14.1 11.5 - 14.5 %   Platelets 260 150 - 440 K/uL    Ct Head Wo Contrast  Result Date: 04/12/2016 CLINICAL DATA:  Nausea. Dizziness and disorientation. Recent lithotripsy. EXAM: CT HEAD WITHOUT CONTRAST TECHNIQUE: Contiguous axial images were obtained from the base of the skull through the vertex without intravenous contrast. COMPARISON:  None. FINDINGS:  Brain: No evidence of acute infarction, hemorrhage, hydrocephalus, extra-axial collection or mass lesion/mass effect. Prominence of the sulci and ventricles identified compatible with brain atrophy. Low attenuation within the subcortical and periventricular white matter noted compatible with chronic microvascular disease. Vascular: No hyperdense vessel or unexpected calcification. Skull: Normal. Negative for fracture or focal lesion. Sinuses/Orbits: No acute finding. Other: None. IMPRESSION: 1. No acute intracranial abnormality. 2. Chronic microvascular disease and brain atrophy. Electronically Signed   By: Kerby Moors M.D.   On: 04/12/2016 19:15   US Renal  Result Date: 04/12/2016 CLINICAL DATA:  83 y/o M; light foot should see dense yesterday presenting with nausea. EXAM: RENAL / URINARY TRACT ULTRASOUND COMPLETE COMPARISON:  None. FINDINGS: Right Kidney: Length: 10.9 cm.  No mass or stone identified.  Mild pelviectasis. Left Kidney: Length: 10.6 cm. 4 mm echogenic focus within the interpolar kidney likely representing a stone. No hydronephrosis. Bladder: Partially visualize stent within the bladder. Right ureteral jet not seen. Left ureteral jet seen. Prostate volume 28 cm3. IMPRESSION: 1. Mild right pelviectasis. 2. Left kidney interpolar 5 mm stone.  No left hydronephrosis. 3. Left ureteral jet seen. Partially visualized stent within the bladder. Right ureteral jet not seen. Electronically Signed   By: Kristine Garbe M.D.   On: 04/12/2016 19:35   Dg Chest Port 1 View  Result Date: 04/12/2016 CLINICAL DATA:  Confusion, nausea, and dizziness beginning today. EXAM: PORTABLE CHEST 1 VIEW COMPARISON:  None. FINDINGS: Mild cardiomegaly. Aortic atherosclerosis. No evidence of pulmonary infiltrate or edema. No evidence of pleural effusion or pneumothorax. IMPRESSION: Cardiomegaly.  No active lung disease. Electronically Signed   By: Earle Gell M.D.   On: 04/12/2016 18:16    Review of Systems   Constitutional: Positive for malaise/fatigue.  HENT: Negative.   Eyes: Negative.   Respiratory: Negative.   Cardiovascular: Negative.   Gastrointestinal: Negative.   Genitourinary: Negative.   Musculoskeletal: Negative.   Skin: Negative.   Neurological: Negative.   Endo/Heme/Allergies: Negative.   Psychiatric/Behavioral: Negative.    Blood pressure (!) 96/52, pulse 90, temperature 98.5 F (36.9 C), temperature source Oral, resp. rate 18, height 5' 6"  (1.676 m), weight 71 kg (156 lb 8 oz), SpO2 94 %. Physical Exam  Constitutional: He is oriented to person, place, and time. He appears well-developed.  HENT:  Head: Normocephalic.  Eyes: Pupils are equal, round, and reactive to light.  Neck: Normal range of motion.  Cardiovascular: Normal rate.   Respiratory: Effort normal.  GI: Soft.  Genitourinary: Penis normal.  Genitourinary Comments: UNcircumcised. No CVAT. No scrotal masses.   Neurological: He is alert and oriented to person, place, and time.  Skin: Skin is warm.  Psychiatric: He has a normal mood and affect.    Assessment/Plan:   1 - Nephrolithiasis -  now intraluminal stone free. Rec KEEP current left ureteral stent to ensure maximal antegrade drainage in setting of likely urosepsis.   2 - Urosepsis  - agree with current ABX pending further CX data. As no s/s acute obstruction, I do not feel further procedural intervention would be advantageous.   Please call me directly with questions.    Akhilesh Sassone 04/13/2016, 11:19 AM

## 2016-04-13 NOTE — Progress Notes (Signed)
Paged Dr. Imogene Burnhen about NPO order.

## 2016-04-13 NOTE — Evaluation (Signed)
Physical Therapy Evaluation Patient Details Name: William JUDICE Sr. MRN: [id number] DOB: August 25, 1932 Today's Date: 04/13/2016   History of Present Illness  Pt is an 83 y.o. male presenting to hospital with nausea and AMS.  Pt s/p lithotripsy for R sided kidney stone 1 day prior.  Pt admitted with UTI.  PMH includes a-fib, dementia, recurrent B inguinal hernia, DM.  Clinical Impression  Prior to hospital admission, pt was independent with functional mobility and active walking.  Pt lives with a friend and both help each other out as needed.  Currently pt is modified independent with bed mobility and CGA with transfers and ambulation around nursing loop without AD.  Pt still demonstrates some confusion and difficulty following multi-step commands but cognition appears to be improving per nursing and pt's friend's report.  Pt would benefit from skilled PT to address noted impairments and functional limitations during hospital stay.  Recommend pt discharge to home when medically appropriate with initial 24/7 supervision for safety (d/t pt still having some confusion).    Follow Up Recommendations No PT follow up;Supervision/Assistance - 24 hour    Equipment Recommendations  None recommended by PT    Recommendations for Other Services       Precautions / Restrictions Precautions Precautions: Fall Restrictions Weight Bearing Restrictions: No      Mobility  Bed Mobility Overal bed mobility: Modified Independent             General bed mobility comments: Supine to sit with HOB elevated.  Transfers Overall transfer level: Needs assistance Equipment used: None Transfers: Sit to/from Stand Sit to Stand: Min guard         General transfer comment: strong stand; steady without loss of balance  Ambulation/Gait Ambulation/Gait assistance: Min guard Ambulation Distance (Feet): 240 Feet Assistive device: None   Gait velocity: mildly decreased   General Gait Details: mild  decreased B step length; steady without loss of balance  Stairs            Wheelchair Mobility    Modified Rankin (Stroke Patients Only)       Balance Overall balance assessment: Needs assistance Sitting-balance support: No upper extremity supported;Feet supported Sitting balance-Leahy Scale: Normal     Standing balance support: No upper extremity supported;During functional activity Standing balance-Leahy Scale: Good Standing balance comment: with ambulation                             Pertinent Vitals/Pain Pain Assessment: No/denies pain  Vitals (HR and O2 on room air) stable and WFL throughout treatment session.    Home Living Family/patient expects to be discharged to:: Private residence Living Arrangements: Non-relatives/Friends (Male friend) Available Help at Discharge: Friend(s) Type of Home: House Home Access: Stairs to enter Entrance Stairs-Rails: Right;Left;Can reach both Entrance Stairs-Number of Steps: 3 Home Layout: One level Home Equipment: None      Prior Function Level of Independence: Independent         Comments: Pt active walking; denies any falls in past 6 months.     Hand Dominance        Extremity/Trunk Assessment   Upper Extremity Assessment Upper Extremity Assessment: Overall WFL for tasks assessed    Lower Extremity Assessment Lower Extremity Assessment: Overall WFL for tasks assessed       Communication   Communication: No difficulties  Cognition Arousal/Alertness: Awake/alert Behavior During Therapy: WFL for tasks assessed/performed Overall Cognitive Status: Impaired/Different from baseline (Oriented to  person, place, situation.  Knew day but not month or year.) Area of Impairment: Following commands       Following Commands: Follows multi-step commands inconsistently            General Comments General comments (skin integrity, edema, etc.): Pt's male friend present during session.     Exercises Total Joint Exercises Ankle Circles/Pumps: AROM;Strengthening;Both;10 reps;Supine Quad Sets: AROM;Strengthening;Both;10 reps;Supine Short Arc Quad: AROM;Strengthening;Both;10 reps;Supine Heel Slides: AROM;Strengthening;Both;10 reps;Supine Straight Leg Raises: AROM;Strengthening;Both;10 reps;Supine   Assessment/Plan    PT Assessment Patient needs continued PT services  PT Problem List Decreased cognition;Decreased balance       PT Treatment Interventions Gait training;Stair training;Functional mobility training;Therapeutic activities;Therapeutic exercise;Balance training;Patient/family education    PT Goals (Current goals can be found in the Care Plan section)  Acute Rehab PT Goals Patient Stated Goal: to go home PT Goal Formulation: With patient Time For Goal Achievement: 04/27/16 Potential to Achieve Goals: Good    Frequency Min 2X/week   Barriers to discharge        Co-evaluation               End of Session Equipment Utilized During Treatment: Gait belt Activity Tolerance: Patient tolerated treatment well Patient left: in chair;with call bell/phone within reach;with chair alarm set;with family/visitor present Nurse Communication: Mobility status;Precautions PT Visit Diagnosis: Difficulty in walking, not elsewhere classified (R26.2)         Time: [id number] PT Time Calculation (min) (ACUTE ONLY): 35 min   Charges:   PT Evaluation $PT Eval Low Complexity: 1 Procedure PT Treatments $Therapeutic Activity: 23-37 mins   PT G CodesHendricks Lozano:         William Lozano, PT 04/13/16, 2:59 PM [phone]

## 2016-04-13 NOTE — Progress Notes (Signed)
Pharmacy Antibiotic Note  William RegulusJames B Plouff Sr. is a 83 y.o. male admitted on 04/12/2016 with UTI/sepsis.  Pharmacy has been consulted for meropenem dosing.  Pt with hx of rash to PCN - discussed with Dr. Imogene Burnhen. Pt has received imipenem, keflex, ceftriaxone, and ertapenem since allergy was reported. Will change aztreonam/levaquin to meropenem.   Plan: Meropenem 1 g IV q12h  Height: 5\' 6"  (167.6 cm) Weight: 156 lb 8 oz (71 kg) IBW/kg (Calculated) : 63.8  Temp (24hrs), Avg:98.8 F (37.1 C), Min:98 F (36.7 C), Max:100.6 F (38.1 C)   Recent Labs Lab 04/12/16 1653 04/12/16 1725 04/12/16 2229 04/13/16 0243  WBC 26.4*  --   --  34.4*  CREATININE 1.50*  --   --  1.53*  LATICACIDVEN  --  2.4* 2.7* 3.5*  VANCOTROUGH  --   --   --  17    Estimated Creatinine Clearance: 34.2 mL/min (by C-G formula based on SCr of 1.53 mg/dL (H)).    Allergies  Allergen Reactions  . Codeine Other (See Comments)    Causes side effects  . Penicillin G Rash  . Sulfa Antibiotics Rash    Antimicrobials this admission: Vanc 2/21 >> Levaquin 2/21 >>2/22 Aztreonam 2/21 >>2/22 Meropenem 2/22>>    Microbiology results: BCx: NGTD UCx: sent MRSA PCR: neg  Thank you for allowing pharmacy to be a part of this patient's care.  Marty HeckWang, Tongela Encinas L 04/13/2016 4:10 PM

## 2016-04-13 NOTE — Progress Notes (Signed)
[facility] - [place] at [facility]   PATIENT NAME: Latanya MaudlinJames Spelman    MR#:  [id number]  DATE OF BIRTH:  09/24/1932  SUBJECTIVE:  CHIEF COMPLAINT:   Chief Complaint  Patient presents with  . Nausea  . Altered Mental Status   No complaints except generalized weakness. REVIEW OF SYSTEMS:  Review of Systems  Constitutional: Positive for malaise/fatigue. Negative for chills and fever.  HENT: Negative for congestion and sore throat.   Eyes: Negative for blurred vision and double vision.  Respiratory: Negative for cough, shortness of breath and stridor.   Cardiovascular: Negative for chest pain and leg swelling.  Gastrointestinal: Negative for abdominal pain, blood in stool, constipation, diarrhea, melena, nausea and vomiting.  Genitourinary: Negative for dysuria and hematuria.  Musculoskeletal: Negative for joint pain.  Skin: Negative for itching and rash.  Neurological: Positive for weakness. Negative for dizziness, focal weakness and loss of consciousness.  Psychiatric/Behavioral: Negative for depression. The patient is not nervous/anxious.     DRUG ALLERGIES:   Allergies  Allergen Reactions  . Codeine Other (See Comments)    Causes side effects  . Penicillin G Rash  . Sulfa Antibiotics Rash   VITALS:  Blood pressure 102/64, pulse (!) 109, temperature 98.3 F (36.8 C), temperature source Oral, resp. rate 17, height 5\' 6"  (1.676 m), weight 156 lb 8 oz (71 kg), SpO2 95 %. PHYSICAL EXAMINATION:  Physical Exam  Constitutional: He is well-developed, well-nourished, and in no distress.  HENT:  Mouth/Throat: Oropharynx is clear and moist.  Eyes: Conjunctivae and EOM are normal. Pupils are equal, round, and reactive to light.  Neck: Normal range of motion. Neck supple. No JVD present. No tracheal deviation present.  Cardiovascular: Normal rate, regular rhythm and normal heart sounds.  Exam reveals no gallop.   No murmur heard. Pulmonary/Chest: Effort normal and  breath sounds normal. No respiratory distress. He has no wheezes. He has no rales.  Abdominal: Soft. Bowel sounds are normal. He exhibits no distension. There is no tenderness.  Musculoskeletal: He exhibits no edema or tenderness.  Neurological:  AAO x2  Skin: No rash noted. No erythema.  Psychiatric: Affect normal.   LABORATORY PANEL:  Male CBC  Recent Labs Lab 04/13/16 0243  WBC 34.4*  HGB 15.0  HCT 44.7  PLT 260   ------------------------------------------------------------------------------------------------------------------ Chemistries   Recent Labs Lab 04/12/16 1653 04/13/16 0243  NA 134* 139  K 4.0 5.0  CL 97* 106  CO2 27 25  GLUCOSE 191* 188*  BUN 22* 23*  CREATININE 1.50* 1.53*  CALCIUM 9.6 8.3*  AST 28  --   ALT 17  --   ALKPHOS 93  --   BILITOT 2.1*  --    RADIOLOGY:  Ct Head Wo Contrast  Result Date: 04/12/2016 CLINICAL DATA:  Nausea. Dizziness and disorientation. Recent lithotripsy. EXAM: CT HEAD WITHOUT CONTRAST TECHNIQUE: Contiguous axial images were obtained from the base of the skull through the vertex without intravenous contrast. COMPARISON:  None. FINDINGS: Brain: No evidence of acute infarction, hemorrhage, hydrocephalus, extra-axial collection or mass lesion/mass effect. Prominence of the sulci and ventricles identified compatible with brain atrophy. Low attenuation within the subcortical and periventricular white matter noted compatible with chronic microvascular disease. Vascular: No hyperdense vessel or unexpected calcification. Skull: Normal. Negative for fracture or focal lesion. Sinuses/Orbits: No acute finding. Other: None. IMPRESSION: 1. No acute intracranial abnormality. 2. Chronic microvascular disease and brain atrophy. Electronically Signed   By: Signa Kellaylor  Stroud M.D.   On: 04/12/2016  19:15   US Renal  Result Date: 04/12/2016 CLINICAL DATA:  83 y/o M; light foot should see dense yesterday presenting with nausea. EXAM: RENAL / URINARY  TRACT ULTRASOUND COMPLETE COMPARISON:  None. FINDINGS: Right Kidney: Length: 10.9 cm.  No mass or stone identified.  Mild pelviectasis. Left Kidney: Length: 10.6 cm. 4 mm echogenic focus within the interpolar kidney likely representing a stone. No hydronephrosis. Bladder: Partially visualize stent within the bladder. Right ureteral jet not seen. Left ureteral jet seen. Prostate volume 28 cm3. IMPRESSION: 1. Mild right pelviectasis. 2. Left kidney interpolar 5 mm stone.  No left hydronephrosis. 3. Left ureteral jet seen. Partially visualized stent within the bladder. Right ureteral jet not seen. Electronically Signed   By: Mitzi Hansen M.D.   On: 04/12/2016 19:35   Dg Chest Port 1 View  Result Date: 04/12/2016 CLINICAL DATA:  Confusion, nausea, and dizziness beginning today. EXAM: PORTABLE CHEST 1 VIEW COMPARISON:  None. FINDINGS: Mild cardiomegaly. Aortic atherosclerosis. No evidence of pulmonary infiltrate or edema. No evidence of pleural effusion or pneumothorax. IMPRESSION: Cardiomegaly.  No active lung disease. Electronically Signed   By: Myles Rosenthal M.D.   On: 04/12/2016 18:16   Dg Abd Portable 1v  Result Date: 04/13/2016 CLINICAL DATA:  Left ureteral stent placement EXAM: PORTABLE ABDOMEN - 1 VIEW COMPARISON:  03/27/2016 abdominal CT FINDINGS: Left ureteral stent is in expected position with fully formed retention loops overlapping the left renal fossa and bladder. Right renal calculus by CT is not seen on this study. Normal bowel gas pattern. Lumbar spine degeneration with scoliosis. Left inguinal hernia repair. IMPRESSION: Left ureteral stent in expected position. No calculi seen along the stent course. Electronically Signed   By: Marnee Spring M.D.   On: 04/13/2016 12:02   ASSESSMENT AND PLAN:   Sepsis due to UTI. Leukocytosis is worsening. with recent instrumentation, and also recent infection. Continue Antibiotics, f/u culture and CBC.  Lactic acidosis. Worsening, continue  antibiotics and IV fluid to support and follow-up lactic acid.    Atrial fibrillation (HCC) - continue home meds   Calculus of kidney - ultrasound done and patient has 1 remaining small kidney stone in his left kidney, with no evidence of obstruction. Urology consulted  HTN- blood pressure has been low end normal, hold antihypertensives.  CKD stage III. Stable.  Generalized weakness. PT evaluation.  All the records are reviewed and case discussed with Care Management/Social Worker. Management plans discussed with the patient, family and they are in agreement.  CODE STATUS: Full Code  TOTAL TIME TAKING CARE OF THIS PATIENT: 39 minutes.   More than 50% of the time was spent in counseling/coordination of care: YES  POSSIBLE D/C IN 2-3 DAYS, DEPENDING ON CLINICAL CONDITION.   Shaune Pollack M.D on 04/13/2016 at 1:52 PM  Between 7am to 6pm - Pager - [phone]  After 6pm go to [email or web address] - Social research officer, government  [facility] [place] [facility]  Office  [phone]  CC: Primary care physician; Marguarite Arbour, MD  Note: This dictation was prepared with Dragon dictation along with smaller phrase technology. Any transcriptional errors that result from this process are unintentional.

## 2016-04-13 NOTE — Progress Notes (Signed)
Inpatient Diabetes Program Recommendations  AACE/ADA: New Consensus Statement on Inpatient Glycemic Control (2015)  Target Ranges:  Prepandial:   less than 140 mg/dL      Peak postprandial:   less than 180 mg/dL (1-2 hours)      Critically ill patients:  140 - 180 mg/dL   Lab Results  Component Value Date   GLUCAP 171 (H) 02/24/2015   HGBA1C 6.7 (H) 02/24/2015    Review of Glycemic Control  Results for William RegulusHOMAS, Tlaloc B SR. (MRN [id number]) as of 04/13/2016 11:39  Ref. Range 02/24/2015 08:34 02/24/2015 15:15  Glucose-Capillary Latest Ref Range: 65 - 99 mg/dL 469106 (H) 629171 (H)    Diabetes history: none noted- last A1C 6.7% 02/24/15 Outpatient Diabetes medications: none  Current orders for Inpatient glycemic control: none  Inpatient Diabetes Program Recommendations:    Per ADA recommendations "consider performing an A1C on all patients with diabetes or hyperglycemia admitted to the hospital if not performed in the prior 3 months".  Susette RacerJulie Marston Mccadden, RN, BA, MHA, CDE Diabetes Coordinator Inpatient Diabetes Program  [phone] (Team Pager) [phone] [facility] Office) 04/13/2016 11:43 AM

## 2016-04-13 NOTE — Progress Notes (Signed)
CRITICAL VALUE ALERT  Critical value received:  Lactic acid 3.5  Date of notification:  04/13/16  Time of notification:  0328  Critical value read back:yes  Nurse who received alert:  Mayra Neerakera Quirino Kakos  MD notified (1st page):  MD Sheryle Hailiamond  Time of first page:  0329  Responding MD:  MD Sheryle Hailiamond  Time MD responded:  [phone]

## 2016-04-13 NOTE — Progress Notes (Signed)
Notified Dr. Imogene Burnhen that nursing held metoprolol due to BP of 96/52.

## 2016-04-14 DIAGNOSIS — A419 Sepsis, unspecified organism: Secondary | ICD-10-CM

## 2016-04-14 DIAGNOSIS — N200 Calculus of kidney: Secondary | ICD-10-CM

## 2016-04-14 DIAGNOSIS — D72829 Elevated white blood cell count, unspecified: Secondary | ICD-10-CM

## 2016-04-14 DIAGNOSIS — R4182 Altered mental status, unspecified: Secondary | ICD-10-CM

## 2016-04-14 LAB — BASIC METABOLIC PANEL
Anion gap: 6 (ref 5–15)
BUN: 25 mg/dL — AB (ref 6–20)
CHLORIDE: 107 mmol/L (ref 101–111)
CO2: 24 mmol/L (ref 22–32)
CREATININE: 1.53 mg/dL — AB (ref 0.61–1.24)
Calcium: 8.5 mg/dL — ABNORMAL LOW (ref 8.9–10.3)
GFR calc Af Amer: 47 mL/min — ABNORMAL LOW (ref >60)
GFR calc non Af Amer: 41 mL/min — ABNORMAL LOW (ref >60)
GLUCOSE: 133 mg/dL — AB (ref 65–99)
Potassium: 3.8 mmol/L (ref 3.5–5.1)
Sodium: 137 mmol/L (ref 135–145)

## 2016-04-14 LAB — LACTIC ACID, PLASMA: Lactic Acid, Venous: 1.4 mmol/L (ref 0.5–1.9)

## 2016-04-14 LAB — HEMOGLOBIN A1C
Hgb A1c MFr Bld: 6.9 % — ABNORMAL HIGH (ref 4.8–5.6)
Mean Plasma Glucose: 151

## 2016-04-14 LAB — CBC
HEMATOCRIT: 40.7 % (ref 40.0–52.0)
HEMOGLOBIN: 13.7 g/dL (ref 13.0–18.0)
MCH: 30.5 pg (ref 26.0–34.0)
MCHC: 33.8 g/dL (ref 32.0–36.0)
MCV: 90.2 fL (ref 80.0–100.0)
Platelets: 192 10*3/uL (ref 150–440)
RBC: 4.51 MIL/uL (ref 4.40–5.90)
RDW: 14.2 % (ref 11.5–14.5)
WBC: 16.4 10*3/uL — ABNORMAL HIGH (ref 3.8–10.6)

## 2016-04-14 MED ORDER — ALUM & MAG HYDROXIDE-SIMETH 200-200-20 MG/5ML PO SUSP
30.0000 mL | Freq: Four times a day (QID) | ORAL | Status: DC | PRN
  Administered 2016-04-14: 30 mL via ORAL
  Filled 2016-04-14: qty 30

## 2016-04-14 MED ORDER — METOPROLOL TARTRATE 25 MG PO TABS
12.5000 mg | ORAL_TABLET | Freq: Two times a day (BID) | ORAL | Status: DC
  Administered 2016-04-14 – 2016-04-15 (×2): 12.5 mg via ORAL
  Filled 2016-04-14 (×2): qty 1

## 2016-04-14 NOTE — Plan of Care (Signed)
Problem: Activity: Goal: Risk for activity intolerance will decrease Outcome: Progressing Patient walked around nurses station three times with staff assistance. No complaints of shortness of breath, pain, or weakness.   Problem: Fluid Volume: Goal: Ability to maintain a balanced intake and output will improve Outcome: Not Progressing Patient has decreased appetite d/t clinical situation.   Problem: Nutrition: Goal: Adequate nutrition will be maintained Outcome: Progressing Patient drinking ensure when not eating meals, prefers chocolate.   Problem: Urinary Elimination: Goal: Signs and symptoms of infection will decrease Outcome: Progressing Patient's WBC count is trending down from yesterday.

## 2016-04-14 NOTE — Progress Notes (Signed)
Inpatient Diabetes Program Recommendations  AACE/ADA: New Consensus Statement on Inpatient Glycemic Control (2015)  Target Ranges:  Prepandial:   less than 140 mg/dL      Peak postprandial:   less than 180 mg/dL (1-2 hours)      Critically ill patients:  140 - 180 mg/dL   Results for [name], William B SR. (MRN [id number]) as of 04/14/2016 08:24  Ref. Range 02/24/2015 21:27 04/13/2016 02:43  Hemoglobin A1C Latest Ref Range: 4.8 - 5.6 % 6.7 (H) 6.9 (H)   Review of Glycemic Control  Diabetes history: DM2 (per H&P dated 03/06/15 by Dr. Winona LegatoVaickute) Outpatient Diabetes medications: none Current orders for Inpatient glycemic control: None  Inpatient Diabetes Program Recommendations:  Correction (SSI): While inpatient, please consider ordering CBGs with Novolog 0-9 units TID with meals. HgbA1C: A1C 6.9% on 04/13/16 indicating an average glucose of 151 mg/dl over the past 2-3 months.  Thanks, Orlando PennerMarie Savyon Loken, RN, MSN, CDE Diabetes Coordinator Inpatient Diabetes Program [phone] (Team Pager from 8am to 5pm)

## 2016-04-14 NOTE — Progress Notes (Addendum)
[employer] [facility] - [place] [facility]   PATIENT NAME: William Lozano    MR#:  [id number]  DATE OF BIRTH:  10/28/1932  SUBJECTIVE:  CHIEF COMPLAINT:   Chief Complaint  Patient presents with  . Nausea  . Altered Mental Status   Felt sick this am. REVIEW OF SYSTEMS:  Review of Systems  Constitutional: Negative for chills, fever and malaise/fatigue.  HENT: Negative for congestion and sore throat.   Eyes: Negative for blurred vision and double vision.  Respiratory: Negative for cough, shortness of breath and stridor.   Cardiovascular: Negative for chest pain and leg swelling.  Gastrointestinal: Negative for abdominal pain, blood in stool, constipation, diarrhea, melena, nausea and vomiting.  Genitourinary: Negative for dysuria and hematuria.  Musculoskeletal: Negative for joint pain.  Skin: Negative for itching and rash.  Neurological: Negative for dizziness, focal weakness, loss of consciousness and weakness.  Psychiatric/Behavioral: Negative for depression. The patient is not nervous/anxious.     DRUG ALLERGIES:   Allergies  Allergen Reactions  . Codeine Other (See Comments)    Causes side effects  . Penicillin G Rash  . Sulfa Antibiotics Rash   VITALS:  Blood pressure 109/66, pulse (!) 121, temperature 98.1 F (36.7 C), temperature source Oral, resp. rate 16, height 5\' 6"  (1.676 m), weight 156 lb 8 oz (71 kg), SpO2 94 %. PHYSICAL EXAMINATION:  Physical Exam  Constitutional: He is oriented to person, place, and time and well-developed, well-nourished, and in no distress.  HENT:  Mouth/Throat: Oropharynx is clear and moist.  Eyes: Conjunctivae and EOM are normal. Pupils are equal, round, and reactive to light.  Neck: Normal range of motion. Neck supple. No JVD present. No tracheal deviation present.  Cardiovascular: Normal rate, regular rhythm and normal heart sounds.  Exam reveals no gallop.   No murmur heard. Pulmonary/Chest: Effort normal and breath  sounds normal. No respiratory distress. He has no wheezes. He has no rales.  Abdominal: Soft. Bowel sounds are normal. He exhibits no distension. There is no tenderness.  Musculoskeletal: He exhibits no edema or tenderness.  Neurological: He is oriented to person, place, and time. No cranial nerve deficit.  Skin: No rash noted. No erythema.  Psychiatric: Affect normal.   LABORATORY PANEL:  Male CBC  Recent Labs Lab 04/14/16 0355  WBC 16.4*  HGB 13.7  HCT 40.7  PLT 192   ------------------------------------------------------------------------------------------------------------------ Chemistries   Recent Labs Lab 04/12/16 1653  04/14/16 0355  NA 134*  < > 137  K 4.0  < > 3.8  CL 97*  < > 107  CO2 27  < > 24  GLUCOSE 191*  < > 133*  BUN 22*  < > 25*  CREATININE 1.50*  < > 1.53*  CALCIUM 9.6  < > 8.5*  AST 28  --   --   ALT 17  --   --   ALKPHOS 93  --   --   BILITOT 2.1*  --   --   < > = values in this interval not displayed. RADIOLOGY:  No results found. ASSESSMENT AND PLAN:   Sepsis due to UTI. Leukocytosis is better (WBC 16.4) with recent instrumentation, and also recent infection. Continue meropenem, urine culture: ESCHERICHIA COLI, Follow-up CBC.  Lactic acidosis. Improved, continue antibiotics and discontinue IV fluid to support.    Atrial fibrillation (HCC) - continue Eliquis, lopressor at low dose.    Calculus of kidney - ultrasound done and patient has 1 remaining small kidney stone in  his left kidney, with no evidence of obstruction. No intervention Urology consult.  HTN- blood pressure has been low end normal, hold antihypertensives.  CKD stage III. Stable.  Generalized weakness. PT evaluation: no PT follow-up.  All the records are reviewed and case discussed with Care Management/Social Worker. Management plans discussed with the patient, family and they are in agreement.  CODE STATUS: Full Code  TOTAL TIME TAKING CARE OF THIS PATIENT: 33  minutes.   More than 50% of the time was spent in counseling/coordination of care: YES  POSSIBLE D/C IN1 DAYS, DEPENDING ON CLINICAL CONDITION.   Shaune Pollack M.D on 04/14/2016 at 4:37 PM  Between 7am to 6pm - Pager - [phone]  After 6pm go to [email or web address] - Social research officer, government  [facility]  Office  [phone]  CC: Primary care physician; Marguarite Arbour, MD  Note: This dictation was prepared with Dragon dictation along with smaller phrase technology. Any transcriptional errors that result from this process are unintentional.

## 2016-04-14 NOTE — Progress Notes (Signed)
Physical Therapy Treatment Patient Details Name: William PISCOPO Sr. MRN: [id number] DOB: [birth year] Today's Date: 04/14/2016    History of Present Illness Pt is an 83 y.o. male presenting to hospital with nausea and AMS.  Pt s/p lithotripsy for R sided kidney stone 1 day prior.  Pt admitted with UTI.  PMH includes a-fib, dementia, recurrent B inguinal hernia, DM.    PT Comments    Pt and pt's friend report pt has been ambulating in hallway and staying active.  Pt able to navigate stairs with railing and CGA.  Pt appearing fatigued with activity but no loss of balance noted during session.  Pt still demonstrating some confusion during session.  Recommend pt discharge home with 24/7 supervision for safety (d/t pt's confusion); pt's male friend (who he lives with) reports she can provide this assist and most likely will discharge to pt's home (instead of friend's home) d/t being a better set-up for pt (1 step to enter and lower bed).    Follow Up Recommendations  No PT follow up;Supervision/Assistance - 24 hour     Equipment Recommendations  None recommended by PT    Recommendations for Other Services       Precautions / Restrictions Precautions Precautions: Fall Restrictions Weight Bearing Restrictions: No    Mobility  Bed Mobility Overal bed mobility: Needs Assistance Bed Mobility: Supine to Sit;Sit to Supine     Supine to sit: Min assist;HOB elevated Sit to supine: Modified independent (Device/Increase time);HOB elevated   General bed mobility comments: pt requesting assist (hand hold of friend) supine to sit  Transfers Overall transfer level: Needs assistance Equipment used: None Transfers: Sit to/from Stand Sit to Stand: Min guard         General transfer comment: strong stand; steady without loss of balance  Ambulation/Gait Ambulation/Gait assistance: Min guard Ambulation Distance (Feet): 240 Feet Assistive device: None   Gait velocity: mildly decreased    General Gait Details: mild decreased B step length; steady without loss of balance   Stairs Stairs: Yes   Stair Management: One rail Left;Alternating pattern;Forwards Number of Stairs: 6 General stair comments: steady without loss of balance  Wheelchair Mobility    Modified Rankin (Stroke Patients Only)       Balance Overall balance assessment: Needs assistance Sitting-balance support: Feet supported;Bilateral upper extremity supported Sitting balance-Leahy Scale: Good     Standing balance support: No upper extremity supported;During functional activity Standing balance-Leahy Scale: Good Standing balance comment: with ambulation                    Cognition Arousal/Alertness:  (Drowsy in bed but wakes well with activity) Behavior During Therapy: WFL for tasks assessed/performed Overall Cognitive Status: Impaired/Different from baseline (Oriented to person, place, but not situation or time) Area of Impairment: Following commands       Following Commands: Follows multi-step commands inconsistently            Exercises      General Comments General comments (skin integrity, edema, etc.): Pt's male friend present during session. Nursing cleared pt for participation in physical therapy.  Pt agreeable to PT session.      Pertinent Vitals/Pain Pain Assessment: No/denies pain    Home Living                      Prior Function            PT Goals (current goals can now be found in the  care plan section) Acute Rehab PT Goals Patient Stated Goal: to go home PT Goal Formulation: With patient Time For Goal Achievement: 04/27/16 Potential to Achieve Goals: Good Progress towards PT goals: Progressing toward goals    Frequency    Min 2X/week      PT Plan Current plan remains appropriate    Co-evaluation             End of Session Equipment Utilized During Treatment: Gait belt Activity Tolerance: Patient limited by  fatigue Patient left: in bed;with call bell/phone within reach;with bed alarm set;with family/visitor present Nurse Communication: Mobility status;Precautions PT Visit Diagnosis: Difficulty in walking, not elsewhere classified (R26.2)     Time: [id number] PT Time Calculation (min) (ACUTE ONLY): 23 min  Charges:  $Gait Training: 8-22 mins $Therapeutic Activity: 8-22 mins                    G CodesHendricks Lozano:       William Lozano, PT 04/14/16, 2:56 PM [phone]

## 2016-04-15 ENCOUNTER — Inpatient Hospital Stay: Payer: Medicare Other

## 2016-04-15 LAB — URINE CULTURE

## 2016-04-15 LAB — CBC
HCT: 42 % (ref 40.0–52.0)
Hemoglobin: 14.4 g/dL (ref 13.0–18.0)
MCH: 31 pg (ref 26.0–34.0)
MCHC: 34.3 g/dL (ref 32.0–36.0)
MCV: 90.2 fL (ref 80.0–100.0)
PLATELETS: 190 10*3/uL (ref 150–440)
RBC: 4.65 MIL/uL (ref 4.40–5.90)
RDW: 14.2 % (ref 11.5–14.5)
WBC: 10.3 10*3/uL (ref 3.8–10.6)

## 2016-04-15 MED ORDER — SODIUM CHLORIDE 0.9 % IV SOLN
1.0000 g | INTRAVENOUS | Status: DC
  Administered 2016-04-15: 1 g via INTRAVENOUS
  Filled 2016-04-15: qty 1

## 2016-04-15 MED ORDER — ERTAPENEM SODIUM 1 G IJ SOLR: 1.0000 g | INTRAMUSCULAR | 0 refills | Status: DC

## 2016-04-15 NOTE — Progress Notes (Signed)
IVs and tele removed from patient. Discharge instructions given to patient and patients family members at bedside. Family verbalized understanding. No distress at this time. Son and brother at bedside and will be transporting patient home.

## 2016-04-15 NOTE — Progress Notes (Addendum)
Infectious Disease Long Term IV Antibiotic Orders  Diagnosis: ESBL E coli UTI  Culture results ESBL E coli Allergies:  Allergies  Allergen Reactions  . Codeine Other (See Comments)    Causes side effects  . Penicillin G Rash  . Sulfa Antibiotics Rash    Discharge antibiotics  Ertapenem       1 gm every  24 hours  PICC Care per protocol Labs weekly while on IV antibiotics      CBC w diff   Comprehensive met panel   Planned duration of antibotics 10 dyas  Stop date 04/22/16  Follow up clinic date Prior to stop date  FAX weekly labs to [phone]  Leonel Ramsay, MD

## 2016-04-15 NOTE — Discharge Summary (Signed)
[facility] - [place] at [facility]   PATIENT NAME: William Lozano    MR#:  [id number]  DATE OF BIRTH:  August 04, 1932  DATE OF ADMISSION:  04/12/2016   ADMITTING PHYSICIAN: Oralia Manis, MD  DATE OF DISCHARGE: 04/15/2016  PRIMARY CARE PHYSICIAN: SPARKS,JEFFREY D, MD   ADMISSION DIAGNOSIS:  Nausea [R11.0] Sepsis, due to unspecified organism (HCC) [A41.9] DISCHARGE DIAGNOSIS:  Principal Problem:   Sepsis (HCC) Active Problems:   Atrial fibrillation (HCC)   Acid reflux   HLD (hyperlipidemia)   BP (high blood pressure)   Senile dementia of Alzheimer's type   Calculus of kidney Sepsis due to ESBL UTI.  SECONDARY DIAGNOSIS:   Past Medical History:  Diagnosis Date  . Arthritis   . Atrial fibrillation (HCC) 2016   paroxysmal atrial fibrillation, sick sinus syndrome  . Complication of anesthesia    affects memory for a longer time after  . Dementia    alzheimers  . Essential hypertension   . GERD (gastroesophageal reflux disease)   . History of kidney stones   . Hyperlipemia   . Inguinal hernia recurrent bilateral   . Kidney stones    HOSPITAL COURSE:  Sepsis due to ESBL UTI. Leukocytosis improved. with recent instrumentation, and also recent infection. Continue meropenem, urine culture: ESBL. PICC line placement,  Ertapenem 1 gm every 24  hours for 10 days per Dr. Sampson Goon.  Lactic acidosis. Improved with  antibiotics and IV fluid support.  Atrial fibrillation (HCC) - continue Eliquis, lopressor at low dose.  Calculus of kidney - ultrasound done and patient has 1 remaining small kidney stone in his left kidney, with no evidence of obstruction. No intervention Urology consult.  HTN- blood pressure is normal, resume antihypertensives.  CKD stage III. Stable.  Generalized weakness. PT evaluation: no PT follow-up.  DISCHARGE CONDITIONS:  Stable, discharge to home with home health today. CONSULTS OBTAINED:  Treatment Team:  Sebastian Ache, MD DRUG ALLERGIES:   Allergies  Allergen Reactions  . Codeine Other (See Comments)    Causes side effects  . Penicillin G Rash  . Sulfa Antibiotics Rash   DISCHARGE MEDICATIONS:   Allergies as of 04/15/2016      Reactions   Codeine Other (See Comments)   Causes side effects   Penicillin G Rash   Sulfa Antibiotics Rash      Medication List    STOP taking these medications   hydrochlorothiazide 25 MG tablet Commonly known as:  HYDRODIURIL   HYDROcodone-acetaminophen 5-325 MG tablet Commonly known as:  NORCO/VICODIN     TAKE these medications   apixaban 5 MG Tabs tablet Commonly known as:  ELIQUIS Take 5 mg by mouth 2 (two) times daily.   ertapenem 1 g in sodium chloride 0.9 % 50 mL Inject 1 g into the vein daily.   metoprolol succinate 25 MG 24 hr tablet Commonly known as:  TOPROL-XL Take 25 mg by mouth daily.   ondansetron 4 MG disintegrating tablet Commonly known as:  ZOFRAN ODT Take 1 tablet (4 mg total) by mouth every 4 (four) hours as needed for nausea or vomiting.   pantoprazole 40 MG tablet Commonly known as:  PROTONIX Take 40 mg by mouth daily.   rivastigmine 1.5 MG capsule Commonly known as:  EXELON Take 1.5 mg by mouth 2 (two) times daily.   simvastatin 20 MG tablet Commonly known as:  ZOCOR Take 20 mg by mouth daily.   traMADol 50 MG tablet Commonly known as:  ULTRAM Take  1 tablet (50 mg total) by mouth every 6 (six) hours as needed for moderate pain.        DISCHARGE INSTRUCTIONS:  See AVS.  If you experience worsening of your admission symptoms, develop shortness of breath, life threatening emergency, suicidal or homicidal thoughts you must seek medical attention immediately by calling 911 or calling your MD immediately  if symptoms less severe.  You Must read complete instructions/literature along with all the possible adverse reactions/side effects for all the Medicines you take and that have been prescribed to you. Take any  new Medicines after you have completely understood and accpet all the possible adverse reactions/side effects.   Please note  You were cared for by a hospitalist during your hospital stay. If you have any questions about your discharge medications or the care you received while you were in the hospital after you are discharged, you can call the unit and asked to speak with the hospitalist on call if the hospitalist that took care of you is not available. Once you are discharged, your primary care physician will handle any further medical issues. Please note that NO REFILLS for any discharge medications will be authorized once you are discharged, as it is imperative that you return to your primary care physician (or establish a relationship with a primary care physician if you do not have one) for your aftercare needs so that they can reassess your need for medications and monitor your lab values.    On the day of Discharge:  VITAL SIGNS:  Blood pressure 126/78, pulse 71, temperature 97.6 F (36.4 C), temperature source Oral, resp. rate 16, height 5\' 6"  (1.676 m), weight 156 lb 8 oz (71 kg), SpO2 97 %. PHYSICAL EXAMINATION:  GENERAL:  83 y.o.-year-old patient lying in the bed with no acute distress.  EYES: Pupils equal, round, reactive to light and accommodation. No scleral icterus. Extraocular muscles intact.  HEENT: Head atraumatic, normocephalic. Oropharynx and nasopharynx clear.  NECK:  Supple, no jugular venous distention. No thyroid enlargement, no tenderness.  LUNGS: Normal breath sounds bilaterally, no wheezing, rales,rhonchi or crepitation. No use of accessory muscles of respiration.  CARDIOVASCULAR: S1, S2 normal. No murmurs, rubs, or gallops.  ABDOMEN: Soft, non-tender, non-distended. Bowel sounds present. No organomegaly or mass.  EXTREMITIES: No pedal edema, cyanosis, or clubbing.  NEUROLOGIC: Cranial nerves II through XII are intact. Muscle strength 5/5 in all extremities. Sensation  intact. Gait not checked.  PSYCHIATRIC: The patient is alert and oriented x 3.  SKIN: No obvious rash, lesion, or ulcer.  DATA REVIEW:   CBC  Recent Labs Lab 04/15/16 0741  WBC 10.3  HGB 14.4  HCT 42.0  PLT 190    Chemistries   Recent Labs Lab 04/12/16 1653  04/14/16 0355  NA 134*  < > 137  K 4.0  < > 3.8  CL 97*  < > 107  CO2 27  < > 24  GLUCOSE 191*  < > 133*  BUN 22*  < > 25*  CREATININE 1.50*  < > 1.53*  CALCIUM 9.6  < > 8.5*  AST 28  --   --   ALT 17  --   --   ALKPHOS 93  --   --   BILITOT 2.1*  --   --   < > = values in this interval not displayed.   Microbiology Results  Results for orders placed or performed during the hospital encounter of 04/12/16  Culture, blood (routine x 2)  Status: None (Preliminary result)   Collection Time: 04/12/16  5:39 PM  Result Value Ref Range Status   Specimen Description BLOOD L HAND  Final   Special Requests BOTTLES DRAWN AEROBIC AND ANAEROBIC  BCAV  Final   Culture NO GROWTH 3 DAYS  Final   Report Status PENDING  Incomplete  Culture, blood (routine x 2)     Status: None (Preliminary result)   Collection Time: 04/12/16  6:18 PM  Result Value Ref Range Status   Specimen Description BLOOD LEFT HAND  Final   Special Requests BOTTLES DRAWN AEROBIC AND ANAEROBIC BCAV  Final   Culture NO GROWTH 3 DAYS  Final   Report Status PENDING  Incomplete  MRSA PCR Screening     Status: None   Collection Time: 04/13/16  2:26 PM  Result Value Ref Range Status   MRSA by PCR NEGATIVE NEGATIVE Final    Comment:        The GeneXpert MRSA Assay (FDA approved for NASAL specimens only), is one component of a comprehensive MRSA colonization surveillance program. It is not intended to diagnose MRSA infection nor to guide or monitor treatment for MRSA infections.   Urine culture     Status: Abnormal   Collection Time: 04/13/16  2:53 PM  Result Value Ref Range Status   Specimen Description URINE, CLEAN CATCH  Final   Special  Requests NONE  Final   Culture (A)  Final    20,000 COLONIES/mL ESCHERICHIA COLI Confirmed Extended Spectrum Beta-Lactamase Producer (ESBL) Performed at [facility], [address], [place], [place] [address]    Report Status 04/15/2016 FINAL  Final   Organism ID, Bacteria ESCHERICHIA COLI (A)  Final      Susceptibility   Escherichia coli - MIC*    AMPICILLIN >=32 RESISTANT Resistant     CEFAZOLIN >=64 RESISTANT Resistant     CEFTRIAXONE >=64 RESISTANT Resistant     CIPROFLOXACIN >=4 RESISTANT Resistant     GENTAMICIN >=16 RESISTANT Resistant     IMIPENEM <=0.25 SENSITIVE Sensitive     NITROFURANTOIN <=16 SENSITIVE Sensitive     TRIMETH/SULFA >=320 RESISTANT Resistant     AMPICILLIN/SULBACTAM >=32 RESISTANT Resistant     PIP/TAZO 8 SENSITIVE Sensitive     Extended ESBL POSITIVE Resistant     * 20,000 COLONIES/mL ESCHERICHIA COLI    RADIOLOGY:  No results found.   Management plans discussed with the patient, family and they are in agreement.  CODE STATUS: Full Code   TOTAL TIME TAKING CARE OF THIS PATIENT: 38 minutes.    Shaune Pollack M.D on 04/15/2016 at 12:47 PM  Between 7am to 6pm - Pager - [phone]  After 6pm go to [email or web address] - Social research officer, government  [facility] [place] [facility]  Office  [phone]  CC: Primary care physician; Marguarite Arbour, MD   Note: This dictation was prepared with Dragon dictation along with smaller phrase technology. Any transcriptional errors that result from this process are unintentional.

## 2016-04-15 NOTE — Progress Notes (Signed)
PHARMACY CONSULT NOTE FOR:  OUTPATIENT  PARENTERAL ANTIBIOTIC THERAPY (OPAT)  Indication: ESBL E.Coli UTI  Regimen: 10 days  End date: 04/25/16  IV antibiotic discharge orders are pended. To discharging provider:  please sign these orders via discharge navigator,  Select New Orders & click on the button choice - Manage This Unsigned Work.     Thank you for allowing pharmacy to be a part of this patient's care.  Gardner CandleSheema M Mousa Prout, PharmD, BCPS Clinical Pharmacist 04/15/2016 10:48 AM

## 2016-04-15 NOTE — Discharge Instructions (Signed)
Heart healthy diet. IV invanze for 10 days. Home health

## 2016-04-15 NOTE — Care Management Note (Signed)
Case Management Note  Patient Details  Name: William PigeonJames B Rusnak Sr. MRN: [id number] Date of Birth: 05/26/1932  Subjective/Objective:     Discussed discharge planning with Eustaquio BoydenPolly Hawkins who will be taking care of Mr [name] while he is receiving IVC ABX. She was updated that the [facility] will deliver the ABX to the home she shares with Mr [name] either today or tomorrow morning. She will be available for home infusion teaching by the [facility] RN on Sunday at noon. Mr [name] has had his PICC line placed and is ready for discharge. This Clinical research associatewriter called the referral for home health IV Ertapenem 1 gram q 24 hours [facility], and also the order for a HH-RN. Vaughan BastaJermaine will notify this writer if there is any problems obtaining the IV ABX or the HH-RN for start of care on 04/16/16 at noon. [facility]16 at 11:30am.                 Action/Plan:   Expected Discharge Date:  04/15/16               Expected Discharge Plan:     In-House Referral:     Discharge planning Services     Post Acute Care Choice:    Choice offered to:     DME Arranged:    DME Agency:     HH Arranged:    HH Agency:     Status of Service:     If discussed at [employer] Meetings, dates discussed:    Additional Comments:  Ranay Ketter A, RN 04/15/2016, 1:26 PM

## 2016-04-17 LAB — CULTURE, BLOOD (ROUTINE X 2)
CULTURE: NO GROWTH
CULTURE: NO GROWTH

## 2016-04-18 ENCOUNTER — Encounter: Payer: Self-pay | Admitting: Urology

## 2016-04-18 ENCOUNTER — Ambulatory Visit (INDEPENDENT_AMBULATORY_CARE_PROVIDER_SITE_OTHER): Payer: Medicare Other | Admitting: Urology

## 2016-04-18 VITALS — BP 131/89 | HR 134 | Wt 154.0 lb

## 2016-04-18 DIAGNOSIS — Z1612 Extended spectrum beta lactamase (ESBL) resistance: Secondary | ICD-10-CM

## 2016-04-18 DIAGNOSIS — A498 Other bacterial infections of unspecified site: Secondary | ICD-10-CM

## 2016-04-18 DIAGNOSIS — N200 Calculus of kidney: Secondary | ICD-10-CM | POA: Diagnosis not present

## 2016-04-18 DIAGNOSIS — N201 Calculus of ureter: Secondary | ICD-10-CM

## 2016-04-18 MED ORDER — LIDOCAINE HCL 2 % EX GEL
1.0000 "application " | Freq: Once | CUTANEOUS | Status: AC
  Administered 2016-04-18: 1 via URETHRAL

## 2016-04-18 NOTE — Addendum Note (Signed)
Addended by: Vanna ScotlandBRANDON, Alfredo Spong on: 04/18/2016 04:03 PM   Modules accepted: Orders

## 2016-04-18 NOTE — Progress Notes (Signed)
   04/18/16  CC:  Chief Complaint  Patient presents with  . Cysto Stent Removal    HPI: [age] year old status post bilateral ureteroscopy, left ureteral stent placement for significant left distal ureteral stone burden. Unfortunately, his postop course was complicated by a UTI, ESBL Escherichia coli currently on IV ertapenem via PICC line.  Today, doing better. No fevers or chills. Mental status has improved. No significant urinary symptoms.  Blood pressure 131/89, pulse (!) 134, weight 154 lb (69.9 kg). NED. A&Ox3.   No respiratory distress   Abd soft, NT, ND Normal phallus with bilateral descended testicles  Cystoscopy/ Stent removal procedure  Patient identification was confirmed, informed consent was obtained, and patient was prepped using Betadine solution.  Lidocaine jelly was administered per urethral meatus.    Preoperative abx where received prior to procedure (ertapenem at 11 AM prior to this appointment).    Procedure: - Flexible cystoscope introduced, without any difficulty.   - Thorough search of the bladder revealed:    normal urethral meatus  Stent seen emanating from right ureteral orifice, grasped with stent graspers, and removed in entirety.     Moderate debris in bladder limiting visualization.  Post-Procedure: - Patient tolerated the procedure well  Assessment/ Plan:  1. Bilateral ureteral calculi status post bilateral ureteroscopy complicated by ESBL Escherichia coli UTI Right ureteral stent removed today Warning symptoms are reviewed  2. Recurrent nephrolithiasis Patient interested in 24 hour urine metabolic workup once this infection has resolved, we'll discuss further at next visit - Urinalysis, Complete - lidocaine (XYLOCAINE) 2 % jelly 1 application; Place 1 application into the urethra once.  3. Infection due to ESBL-producing Escherichia coli Currently on ertapenem for ESBL Escherichia coli UTI Advised to complete course   Return in  about 4 weeks (around 05/16/2016) for renal ultrasound.   Vanna ScotlandAshley Ibrahim Mcpheeters, MD

## 2016-04-19 LAB — URINALYSIS, COMPLETE
Bilirubin, UA: NEGATIVE
Glucose, UA: NEGATIVE
KETONES UA: NEGATIVE
NITRITE UA: NEGATIVE
Specific Gravity, UA: 1.025 (ref 1.005–1.030)
UUROB: 0.2 mg/dL (ref 0.2–1.0)
pH, UA: 5 (ref 5.0–7.5)

## 2016-04-19 LAB — MICROSCOPIC EXAMINATION: BACTERIA UA: NONE SEEN

## 2016-05-12 ENCOUNTER — Encounter: Payer: Self-pay | Admitting: Emergency Medicine

## 2016-05-12 ENCOUNTER — Emergency Department
Admission: EM | Admit: 2016-05-12 | Discharge: 2016-05-12 | Disposition: A | Discharge status: Other Acute Inpatient Hospital | Payer: Medicare Other | Attending: Emergency Medicine | Admitting: Emergency Medicine

## 2016-05-12 ENCOUNTER — Emergency Department: Payer: Medicare Other

## 2016-05-12 DIAGNOSIS — I10 Essential (primary) hypertension: Secondary | ICD-10-CM | POA: Diagnosis not present

## 2016-05-12 DIAGNOSIS — E119 Type 2 diabetes mellitus without complications: Secondary | ICD-10-CM | POA: Diagnosis not present

## 2016-05-12 DIAGNOSIS — I619 Nontraumatic intracerebral hemorrhage, unspecified: Secondary | ICD-10-CM

## 2016-05-12 DIAGNOSIS — G309 Alzheimer's disease, unspecified: Secondary | ICD-10-CM | POA: Diagnosis not present

## 2016-05-12 DIAGNOSIS — Z79899 Other long term (current) drug therapy: Secondary | ICD-10-CM | POA: Insufficient documentation

## 2016-05-12 DIAGNOSIS — R42 Dizziness and giddiness: Secondary | ICD-10-CM | POA: Diagnosis present

## 2016-05-12 LAB — URINALYSIS, COMPLETE (UACMP) WITH MICROSCOPIC
BACTERIA UA: NONE SEEN
Bilirubin Urine: NEGATIVE
Glucose, UA: NEGATIVE mg/dL
KETONES UR: NEGATIVE mg/dL
Leukocytes, UA: NEGATIVE
NITRITE: NEGATIVE
PROTEIN: 30 mg/dL — AB
Specific Gravity, Urine: 1.014 (ref 1.005–1.030)
pH: 5 (ref 5.0–8.0)

## 2016-05-12 LAB — BASIC METABOLIC PANEL
ANION GAP: 10 (ref 5–15)
BUN: 19 mg/dL (ref 6–20)
CO2: 26 mmol/L (ref 22–32)
Calcium: 9.7 mg/dL (ref 8.9–10.3)
Chloride: 101 mmol/L (ref 101–111)
Creatinine, Ser: 1.24 mg/dL (ref 0.61–1.24)
GFR calc Af Amer: >60 mL/min (ref >60)
GFR calc non Af Amer: 53 mL/min — ABNORMAL LOW (ref >60)
GLUCOSE: 109 mg/dL — AB (ref 65–99)
POTASSIUM: 3.4 mmol/L — AB (ref 3.5–5.1)
Sodium: 137 mmol/L (ref 135–145)

## 2016-05-12 LAB — CBC
HEMATOCRIT: 49.3 % (ref 40.0–52.0)
HEMOGLOBIN: 16.7 g/dL (ref 13.0–18.0)
MCH: 30.5 pg (ref 26.0–34.0)
MCHC: 33.9 g/dL (ref 32.0–36.0)
MCV: 89.9 fL (ref 80.0–100.0)
Platelets: 247 10*3/uL (ref 150–440)
RBC: 5.49 MIL/uL (ref 4.40–5.90)
RDW: 15.2 % — ABNORMAL HIGH (ref 11.5–14.5)
WBC: 12.8 10*3/uL — ABNORMAL HIGH (ref 3.8–10.6)

## 2016-05-12 LAB — APTT: aPTT: 33 seconds (ref 24–36)

## 2016-05-12 LAB — PROTIME-INR
INR: 1.4
PROTHROMBIN TIME: 17.3 s — AB (ref 11.4–15.2)

## 2016-05-12 LAB — HEPARIN LEVEL (UNFRACTIONATED)

## 2016-05-12 MED ORDER — PROTHROMBIN COMPLEX CONC HUMAN 500 UNITS IV KIT
3500.0000 [IU] | PACK | Freq: Once | Status: AC
  Administered 2016-05-12: 3500 [IU] via INTRAVENOUS
  Filled 2016-05-12: qty 140

## 2016-05-12 NOTE — ED Provider Notes (Addendum)
[facility] Emergency Department Provider Note  ____________________________________________   I have reviewed the triage vital signs and the nursing notes.   HISTORY  Chief Complaint Dizziness and vision changes    HPI William Lozano. is a 83 y.o. male  history of Alzheimer's dementia on Eliquis, presents today after feeling "spinning" since yesterday morning. No trauma. No focal numbness or weakness. At his baseline mentally according to family, presents today complaining of only low symptoms. No fall no head injury.      Past Medical History:  Diagnosis Date  . Arthritis   . Atrial fibrillation (HCC) 2016   paroxysmal atrial fibrillation, sick sinus syndrome  . Complication of anesthesia    affects memory for a longer time after  . Dementia    alzheimers  . Essential hypertension   . GERD (gastroesophageal reflux disease)   . History of kidney stones   . Hyperlipemia   . Inguinal hernia recurrent bilateral   . Kidney stones     Patient Active Problem List   Diagnosis Date Noted  . Urolithiasis 03/28/2016  . Pyuria 03/28/2016  . Hydronephrosis 03/27/2016  . Cardiac arrhythmia 07/22/2015  . Atrial fibrillation with RVR (HCC) 02/24/2015  . Cardiac conduction disorder 02/04/2015  . Arthritis 02/04/2015  . Atrial fibrillation (HCC) 02/04/2015  . Acid reflux 02/04/2015  . H/O vertigo 02/04/2015  . HLD (hyperlipidemia) 02/04/2015  . BP (high blood pressure) 02/04/2015  . Calculus of kidney 02/04/2015  . Sick sinus syndrome (HCC) 02/04/2015  . Fungal infection of nail 02/04/2015  . Type 2 diabetes mellitus (HCC) 02/04/2015  . Sepsis (HCC) 12/10/2014  . Systemic infection (HCC) 12/10/2014  . Sepsis(995.91) 12/10/2014  . Altered bowel function 10/13/2014  . Chronic diarrhea 10/13/2014  . Change in bowel habits 10/13/2014  . Senile dementia of Alzheimer's type 09/20/2014  . Alzheimer's dementia, late onset 09/20/2014  . Drug  resistance 10/22/2013    Past Surgical History:  Procedure Laterality Date  . CYSTOSCOPY W/ RETROGRADES Bilateral 03/27/2016   Procedure: CYSTOSCOPY WITH RETROGRADE PYELOGRAM;  Surgeon: Vanna Scotland, MD;  Location: [facility];  Service: Urology;  Laterality: Bilateral;  . CYSTOSCOPY W/ URETERAL STENT PLACEMENT Bilateral 02/24/2015   Procedure: CYSTOSCOPY WITH STENT REPLACEMENT;  Surgeon: Hildred Laser, MD;  Location: [facility];  Service: Urology;  Laterality: Bilateral;  . CYSTOSCOPY W/ URETERAL STENT PLACEMENT Left 04/11/2016   Procedure: CYSTOSCOPY WITH STENT REPLACEMENT;  Surgeon: Vanna Scotland, MD;  Location: [facility];  Service: Urology;  Laterality: Left;  . CYSTOSCOPY W/ URETERAL STENT REMOVAL Right 04/11/2016   Procedure: CYSTOSCOPY WITH STENT REMOVAL;  Surgeon: Vanna Scotland, MD;  Location: [facility];  Service: Urology;  Laterality: Right;  . CYSTOSCOPY WITH RETROGRADE PYELOGRAM, URETEROSCOPY AND STENT PLACEMENT Bilateral 12/10/2014   Procedure: CYSTOSCOPY WITH RETROGRADE PYELOGRAM, URETEROSCOPY AND STENT PLACEMENT;  Surgeon: Hildred Laser, MD;  Location: [facility];  Service: Urology;  Laterality: Bilateral;  . CYSTOSCOPY WITH STENT PLACEMENT Bilateral 12/10/2014   Procedure: CYSTOSCOPY WITH STENT PLACEMENT;  Surgeon: Hildred Laser, MD;  Location: [facility];  Service: Urology;  Laterality: Bilateral;  . CYSTOSCOPY WITH STENT PLACEMENT Bilateral 03/27/2016   Procedure: CYSTOSCOPY WITH STENT PLACEMENT;  Surgeon: Vanna Scotland, MD;  Location: [facility];  Service: Urology;  Laterality: Bilateral;  . EXTRACORPOREAL SHOCK WAVE LITHOTRIPSY Right 01/07/2015   Procedure: EXTRACORPOREAL SHOCK WAVE LITHOTRIPSY (ESWL);  Surgeon: Lorraine Lax, MD;  Location: [facility];  Service: Urology;  Laterality: Right;  . EXTRACORPOREAL SHOCK WAVE  LITHOTRIPSY Left 01/21/2015   Procedure: EXTRACORPOREAL SHOCK WAVE LITHOTRIPSY (ESWL);  Surgeon: Hildred Laser, MD;  Location: [facility];  Service: Urology;   Laterality: Left;  . EYE SURGERY Bilateral    Cataract Extraction with IOL  . HERNIA REPAIR Bilateral    inguinal  . URETEROSCOPY WITH HOLMIUM LASER LITHOTRIPSY Bilateral 02/24/2015   Procedure: URETEROSCOPY WITH HOLMIUM LASER LITHOTRIPSY /STONE BASKETING;  Surgeon: Hildred Laser, MD;  Location: [facility];  Service: Urology;  Laterality: Bilateral;  . URETEROSCOPY WITH HOLMIUM LASER LITHOTRIPSY Bilateral 04/11/2016   Procedure: URETEROSCOPY WITH HOLMIUM LASER LITHOTRIPSY;  Surgeon: Vanna Scotland, MD;  Location: [facility];  Service: Urology;  Laterality: Bilateral;    Prior to Admission medications   Medication Sig Start Date End Date Taking? Authorizing Provider  apixaban (ELIQUIS) 5 MG TABS tablet Take 5 mg by mouth 2 (two) times daily.    Historical Provider, MD  ertapenem 1 g in sodium chloride 0.9 % 50 mL Inject 1 g into the vein daily. 04/15/16   Shaune Pollack, MD  metoprolol succinate (TOPROL-XL) 25 MG 24 hr tablet Take 25 mg by mouth daily.    Historical Provider, MD  ondansetron (ZOFRAN ODT) 4 MG disintegrating tablet Take 1 tablet (4 mg total) by mouth every 4 (four) hours as needed for nausea or vomiting. 04/12/16   Harle Battiest, PA-C  pantoprazole (PROTONIX) 40 MG tablet Take 40 mg by mouth daily.    Historical Provider, MD  rivastigmine (EXELON) 1.5 MG capsule Take 1.5 mg by mouth 2 (two) times daily.    Historical Provider, MD  simvastatin (ZOCOR) 20 MG tablet Take 20 mg by mouth daily.    Historical Provider, MD  traMADol (ULTRAM) 50 MG tablet Take 1 tablet (50 mg total) by mouth every 6 (six) hours as needed for moderate pain. 03/28/16   Katharina Caper, MD    Allergies Codeine; Penicillin g; and Sulfa antibiotics  Family History  Problem Relation Age of Onset  . Heart attack Mother   . Heart attack Father   . Hypertension    . Kidney disease Neg Hx   . Prostate cancer Neg Hx     Social History Social History  Substance Use Topics  . Smoking status: Never Smoker  .  Smokeless tobacco: Never Used  . Alcohol use No    Review of Systems Constitutional: No fever/chills Eyes: No visual changes. ENT: No sore throat. No stiff neck no neck pain Cardiovascular: Denies chest pain. Respiratory: Denies shortness of breath. Gastrointestinal:   no vomiting.  No diarrhea.  No constipation. Genitourinary: Negative for dysuria. Musculoskeletal: Negative lower extremity swelling Skin: Negative for rash. Neurological: Negative for severe headaches, focal weakness or numbness. 10-point ROS otherwise negative.  ____________________________________________   PHYSICAL EXAM:  VITAL SIGNS: ED Triage Vitals [05/12/16 1111]  Enc Vitals Group     BP 137/87     Pulse Rate 75     Resp 16     Temp 98.2 F (36.8 C)     Temp Source Oral     SpO2 98 %     Weight 155 lb (70.3 kg)     Height 5\' 7"  (1.702 m)     Head Circumference      Peak Flow      Pain Score      Pain Loc      Pain Edu?      Excl. in GC?     Constitutional: Alert and orientedTo name and place unsure  of the date at baseline per family. Well appearing and in no acute distress. Eyes: Conjunctivae are normal. PERRL. EOMI. Head: Atraumatic. Nose: No congestion/rhinnorhea. Mouth/Throat: Mucous membranes are moist.  Oropharynx non-erythematous. Neck: No stridor.   Nontender with no meningismus Cardiovascular: Irregularly irregular mild tachycardia. Grossly normal heart sounds.  Good peripheral circulation. Respiratory: Normal respiratory effort.  No retractions. Lungs CTAB. Abdominal: Soft and nontender. No distention. No guarding no rebound Back:  There is no focal tenderness or step off.  there is no midline tenderness there are no lesions noted. there is no CVA tenderness Musculoskeletal: No lower extremity tenderness, no upper extremity tenderness. No joint effusions, no DVT signs strong distal pulses no edema Neurologic: Cranial nerves II through XII are grossly intact 5 out of 5 strength  bilateral upper and lower extremity. Finger to nose within normal limits heel to shin within normal limits, speech is normal with no word finding difficulty or dysarthria, reflexes symmetric, pupils are equally round and reactive to light, there is no pronator drift, sensation is normal, vision is intact to confrontation, gait is deferred, there is no nystagmus, normal neurologic exam Skin:  Skin is warm, dry and intact. No rash noted. Psychiatric: Mood and affect are normal. Speech and behavior are normal.  ____________________________________________   LABS (all labs ordered are listed, but only abnormal results are displayed)  Labs Reviewed  BASIC METABOLIC PANEL - Abnormal; Notable for the following:       Result Value   Potassium 3.4 (*)    Glucose, Bld 109 (*)    GFR calc non Af Amer 53 (*)    All other components within normal limits  CBC - Abnormal; Notable for the following:    WBC 12.8 (*)    RDW 15.2 (*)    All other components within normal limits  URINALYSIS, COMPLETE (UACMP) WITH MICROSCOPIC  PROTIME-INR  APTT  CBG MONITORING, ED   ____________________________________________  EKG  I personally interpreted any EKGs ordered by me or triage Atrial fibrillation with mild rapid ventricular spot rate 115, no acute ST elevation or depression nonspecific ST changes ____________________________________________  RADIOLOGY  I reviewed any imaging ordered by me or triage that were performed during my shift and, if possible, patient and/or family made aware of any abnormal findings. ____________________________________________   PROCEDURES  Procedure(s) performed: None  Procedures  Critical Care performed: CRITICAL CARE Performed by: Jeanmarie Plant   Total critical care time: 40 minutes  Critical care time was exclusive of separately billable procedures and treating other patients.  Critical care was necessary to treat or prevent imminent or life-threatening  deterioration.  Critical care was time spent personally by me on the following activities: development of treatment plan with patient and/or surrogate as well as nursing, discussions with consultants, evaluation of patient's response to treatment, examination of patient, obtaining history from patient or surrogate, ordering and performing treatments and interventions, ordering and review of laboratory studies, ordering and review of radiographic studies, pulse oximetry and re-evaluation of patient's condition.   ____________________________________________   INITIAL IMPRESSION / ASSESSMENT AND PLAN / ED COURSE  Pertinent labs & imaging results that were available during my care of the patient were reviewed by me and considered in my medical decision making (see chart for details).  ----------------------------------------- 12:16 PM on 05/12/2016 -----------------------------------------  Paging [facility]. Ordered [facility]  ----------------------------------------- 12:22 PM on 05/12/2016 ----------------------------------------- Patient neurologically intact with a small head bleed, he is on blood thinners we are reversing  that. [facility] with management including Armandina Stammer[facility], [facility] He remains neurologically intact. His heart rate is slightly elevated but I don't want a don't this pressure too much lower so we'll expectantly manage that.  ----------------------------------------- 2:10 PM on 05/12/2016 -----------------------------------------  I have checked on the patient several times, he has no complaints and feels good. Neurologically he does not appear to be any decompensation we are waiting transfer from [facility].     ____________________________________________   FINAL CLINICAL IMPRESSION(S) / ED DIAGNOSES  Final diagnoses:  None      This chart was dictated using voice  recognition software.  Despite best efforts to proofread,  errors can occur which can change meaning.      Jeanmarie PlantJames A Copelyn Widmer, MD 05/12/16 1233    Jeanmarie PlantJames A Cheryllynn Sarff, MD 05/12/16 [phone]

## 2016-05-12 NOTE — ED Notes (Signed)
Spoke with Dr. Shaune PollackLord and Informed her of pt symptoms. Dr. Shaune PollackLord gave verbal order for stat head CT without contrast.

## 2016-05-12 NOTE — ED Notes (Signed)
Raynelle FanningMona, RN called and informed that [facility].

## 2016-05-12 NOTE — ED Triage Notes (Signed)
Pt states that yesterday morning he started having dizziness. Pt states that the room felt like it was spinning and he felt drunk. Pt states that he had an episode of vertigo years ago and this feels similar. Pt has states that he has had vision changes that started yesterday, pt states that his vision gets blurry and then it clears up. Pt in NAD in triage, no facial droop noted, grips equal bilaterally.

## 2016-05-16 ENCOUNTER — Ambulatory Visit: Payer: Medicare Other

## 2016-05-29 ENCOUNTER — Other Ambulatory Visit: Payer: Self-pay | Admitting: Internal Medicine

## 2016-05-29 DIAGNOSIS — I629 Nontraumatic intracranial hemorrhage, unspecified: Secondary | ICD-10-CM

## 2016-06-06 ENCOUNTER — Ambulatory Visit
Admission: RE | Admit: 2016-06-06 | Discharge: 2016-06-06 | Disposition: A | Discharge status: Home / Self Care | Payer: Medicare Other | Source: Ambulatory Visit | Attending: Internal Medicine | Admitting: Internal Medicine

## 2016-06-06 ENCOUNTER — Other Ambulatory Visit: Payer: Self-pay

## 2016-06-06 DIAGNOSIS — I629 Nontraumatic intracranial hemorrhage, unspecified: Secondary | ICD-10-CM | POA: Insufficient documentation

## 2016-06-06 MED ORDER — IOPAMIDOL (ISOVUE-370) INJECTION 76%
75.0000 mL | Freq: Once | INTRAVENOUS | Status: AC | PRN
  Administered 2016-06-06: 75 mL via INTRAVENOUS

## 2016-06-09 ENCOUNTER — Ambulatory Visit: Payer: Medicare Other | Admitting: Urology

## 2016-06-30 DIAGNOSIS — I619 Nontraumatic intracerebral hemorrhage, unspecified: Secondary | ICD-10-CM | POA: Insufficient documentation

## 2016-07-07 ENCOUNTER — Inpatient Hospital Stay
Admission: EM | Admit: 2016-07-07 | Discharge: 2016-07-09 | DRG: 066 | Disposition: A | Discharge status: Home / Self Care | Payer: Medicare Other | Attending: Internal Medicine | Admitting: Internal Medicine

## 2016-07-07 ENCOUNTER — Encounter: Payer: Self-pay | Admitting: Emergency Medicine

## 2016-07-07 ENCOUNTER — Emergency Department: Payer: Medicare Other

## 2016-07-07 DIAGNOSIS — Z8249 Family history of ischemic heart disease and other diseases of the circulatory system: Secondary | ICD-10-CM | POA: Diagnosis not present

## 2016-07-07 DIAGNOSIS — R42 Dizziness and giddiness: Secondary | ICD-10-CM

## 2016-07-07 DIAGNOSIS — Z87442 Personal history of urinary calculi: Secondary | ICD-10-CM

## 2016-07-07 DIAGNOSIS — Z885 Allergy status to narcotic agent status: Secondary | ICD-10-CM | POA: Diagnosis not present

## 2016-07-07 DIAGNOSIS — K219 Gastro-esophageal reflux disease without esophagitis: Secondary | ICD-10-CM | POA: Diagnosis present

## 2016-07-07 DIAGNOSIS — I639 Cerebral infarction, unspecified: Secondary | ICD-10-CM

## 2016-07-07 DIAGNOSIS — M1990 Unspecified osteoarthritis, unspecified site: Secondary | ICD-10-CM | POA: Diagnosis present

## 2016-07-07 DIAGNOSIS — Q279 Congenital malformation of peripheral vascular system, unspecified: Secondary | ICD-10-CM

## 2016-07-07 DIAGNOSIS — Z882 Allergy status to sulfonamides status: Secondary | ICD-10-CM | POA: Diagnosis not present

## 2016-07-07 DIAGNOSIS — Z880 Allergy status to penicillin: Secondary | ICD-10-CM | POA: Diagnosis not present

## 2016-07-07 DIAGNOSIS — R29700 NIHSS score 0: Secondary | ICD-10-CM | POA: Diagnosis present

## 2016-07-07 DIAGNOSIS — Z79899 Other long term (current) drug therapy: Secondary | ICD-10-CM

## 2016-07-07 DIAGNOSIS — F0280 Dementia in other diseases classified elsewhere without behavioral disturbance: Secondary | ICD-10-CM | POA: Diagnosis present

## 2016-07-07 DIAGNOSIS — H538 Other visual disturbances: Secondary | ICD-10-CM | POA: Diagnosis present

## 2016-07-07 DIAGNOSIS — E785 Hyperlipidemia, unspecified: Secondary | ICD-10-CM | POA: Diagnosis present

## 2016-07-07 DIAGNOSIS — I10 Essential (primary) hypertension: Secondary | ICD-10-CM | POA: Diagnosis present

## 2016-07-07 DIAGNOSIS — I480 Paroxysmal atrial fibrillation: Secondary | ICD-10-CM | POA: Diagnosis present

## 2016-07-07 DIAGNOSIS — Z7901 Long term (current) use of anticoagulants: Secondary | ICD-10-CM | POA: Diagnosis not present

## 2016-07-07 LAB — TROPONIN I

## 2016-07-07 LAB — COMPREHENSIVE METABOLIC PANEL
ALT: 16 U/L — AB (ref 17–63)
AST: 29 U/L (ref 15–41)
Albumin: 4.3 g/dL (ref 3.5–5.0)
Alkaline Phosphatase: 75 U/L (ref 38–126)
Anion gap: 8 (ref 5–15)
BUN: 23 mg/dL — ABNORMAL HIGH (ref 6–20)
CALCIUM: 9.6 mg/dL (ref 8.9–10.3)
CHLORIDE: 103 mmol/L (ref 101–111)
CO2: 26 mmol/L (ref 22–32)
CREATININE: 1.31 mg/dL — AB (ref 0.61–1.24)
GFR, EST AFRICAN AMERICAN: 57 mL/min — AB (ref >60)
GFR, EST NON AFRICAN AMERICAN: 49 mL/min — AB (ref >60)
Glucose, Bld: 209 mg/dL — ABNORMAL HIGH (ref 65–99)
Potassium: 3.3 mmol/L — ABNORMAL LOW (ref 3.5–5.1)
Sodium: 137 mmol/L (ref 135–145)
Total Bilirubin: 0.9 mg/dL (ref 0.3–1.2)
Total Protein: 7.1 g/dL (ref 6.5–8.1)

## 2016-07-07 LAB — CBC
HEMATOCRIT: 49.6 % (ref 40.0–52.0)
Hemoglobin: 16.7 g/dL (ref 13.0–18.0)
MCH: 30.4 pg (ref 26.0–34.0)
MCHC: 33.7 g/dL (ref 32.0–36.0)
MCV: 90 fL (ref 80.0–100.0)
Platelets: 284 10*3/uL (ref 150–440)
RBC: 5.51 MIL/uL (ref 4.40–5.90)
RDW: 15.4 % — AB (ref 11.5–14.5)
WBC: 14 10*3/uL — ABNORMAL HIGH (ref 3.8–10.6)

## 2016-07-07 LAB — DIFFERENTIAL
Basophils Absolute: 0.1 10*3/uL (ref 0–0.1)
Basophils Relative: 1 %
EOS ABS: 0.1 10*3/uL (ref 0–0.7)
EOS PCT: 1 %
Lymphocytes Relative: 8 %
Lymphs Abs: 1.1 10*3/uL (ref 1.0–3.6)
MONO ABS: 0.6 10*3/uL (ref 0.2–1.0)
MONOS PCT: 4 %
Neutro Abs: 12.2 10*3/uL — ABNORMAL HIGH (ref 1.4–6.5)
Neutrophils Relative %: 86 %

## 2016-07-07 LAB — GLUCOSE, CAPILLARY
GLUCOSE-CAPILLARY: 196 mg/dL — AB (ref 65–99)
GLUCOSE-CAPILLARY: 115 mg/dL — AB (ref 65–99)

## 2016-07-07 LAB — PROTIME-INR
INR: 1.13
Prothrombin Time: 14.6 seconds (ref 11.4–15.2)

## 2016-07-07 LAB — APTT: aPTT: 30 seconds (ref 24–36)

## 2016-07-07 MED ORDER — METOPROLOL SUCCINATE ER 50 MG PO TB24
25.0000 mg | ORAL_TABLET | Freq: Every day | ORAL | Status: DC
  Administered 2016-07-08 – 2016-07-09 (×2): 25 mg via ORAL
  Filled 2016-07-07 (×2): qty 1

## 2016-07-07 MED ORDER — APIXABAN 2.5 MG PO TABS
2.5000 mg | ORAL_TABLET | Freq: Two times a day (BID) | ORAL | Status: DC
  Administered 2016-07-08 – 2016-07-09 (×3): 2.5 mg via ORAL
  Filled 2016-07-07 (×3): qty 1

## 2016-07-07 MED ORDER — HYDROCHLOROTHIAZIDE 25 MG PO TABS
25.0000 mg | ORAL_TABLET | Freq: Every day | ORAL | Status: DC
  Administered 2016-07-08 – 2016-07-09 (×2): 25 mg via ORAL
  Filled 2016-07-07 (×2): qty 1

## 2016-07-07 MED ORDER — ASPIRIN 81 MG PO CHEW
324.0000 mg | CHEWABLE_TABLET | Freq: Once | ORAL | Status: AC
  Administered 2016-07-07: 324 mg via ORAL
  Filled 2016-07-07: qty 4

## 2016-07-07 MED ORDER — RIVASTIGMINE TARTRATE 3 MG PO CAPS
3.0000 mg | ORAL_CAPSULE | Freq: Two times a day (BID) | ORAL | Status: DC
  Administered 2016-07-07 – 2016-07-09 (×4): 3 mg via ORAL
  Filled 2016-07-07 (×4): qty 1

## 2016-07-07 MED ORDER — STROKE: EARLY STAGES OF RECOVERY BOOK
Freq: Once | Status: AC
  Administered 2016-07-07: 21:00:00

## 2016-07-07 MED ORDER — SIMVASTATIN 10 MG PO TABS
20.0000 mg | ORAL_TABLET | Freq: Every day | ORAL | Status: DC
  Administered 2016-07-07 – 2016-07-08 (×2): 20 mg via ORAL
  Filled 2016-07-07 (×2): qty 2

## 2016-07-07 MED ORDER — TRAMADOL HCL 50 MG PO TABS: 50.0000 mg | ORAL_TABLET | Freq: Four times a day (QID) | ORAL | Status: DC | PRN

## 2016-07-07 MED ORDER — PANTOPRAZOLE SODIUM 40 MG PO TBEC
40.0000 mg | DELAYED_RELEASE_TABLET | Freq: Every day | ORAL | Status: DC
  Administered 2016-07-08 – 2016-07-09 (×2): 40 mg via ORAL
  Filled 2016-07-07 (×2): qty 1

## 2016-07-07 NOTE — H&P (Signed)
[facility] [place] at [facility]   PATIENT NAME: William Lozano    MR#:  [id number]  DATE OF BIRTH:  1932-09-28  DATE OF ADMISSION:  07/07/2016  PRIMARY CARE PHYSICIAN: Marguarite Arbour, MD   REQUESTING/REFERRING PHYSICIAN: Dr. Lamont Snowball CHIEF COMPLAINT:Dizziness   Chief Complaint  Patient presents with  . Code Stroke    HISTORY OF PRESENT ILLNESS:  William Lozano  is a 83 y.o. male with a known history of Comes in because of dizziness, blurred vision started around 11 AM today. No weakness in hands or legs. No chest pain or shortness of breath. Patient has no facial droop. Felt sudden onset of dizziness and some nausea he denies any double vision or blurred vision. Patient did not have any worsening of symptoms. Patient was seen in the emergency room 2 months ago and transferred to [facility] for intraparenchymal hemorrhage, he was treated conservatively at [facility] and started back on her  Eliquis at lower dose for his A. fib. Specialist on call  For neuro was contacted by emergency room physician they recommended overnight observation and if MRI of the brain is positive for stroke patient is to have a different anticoagulant.  PAST MEDICAL HISTORY:   Past Medical History:  Diagnosis Date  . Arthritis   . Atrial fibrillation (HCC) 2016   paroxysmal atrial fibrillation, sick sinus syndrome  . Complication of anesthesia    affects memory for a longer time after  . Dementia    alzheimers  . Essential hypertension   . GERD (gastroesophageal reflux disease)   . History of kidney stones   . Hyperlipemia   . Inguinal hernia recurrent bilateral   . Kidney stones     PAST SURGICAL HISTOIRY:   Past Surgical History:  Procedure Laterality Date  . CYSTOSCOPY W/ RETROGRADES Bilateral 03/27/2016   Procedure: CYSTOSCOPY WITH RETROGRADE PYELOGRAM;  Surgeon: Vanna Scotland, MD;  Location: [facility];  Service: Urology;  Laterality: Bilateral;  . CYSTOSCOPY W/ URETERAL STENT  PLACEMENT Bilateral 02/24/2015   Procedure: CYSTOSCOPY WITH STENT REPLACEMENT;  Surgeon: Hildred Laser, MD;  Location: [facility];  Service: Urology;  Laterality: Bilateral;  . CYSTOSCOPY W/ URETERAL STENT PLACEMENT Left 04/11/2016   Procedure: CYSTOSCOPY WITH STENT REPLACEMENT;  Surgeon: Vanna Scotland, MD;  Location: [facility];  Service: Urology;  Laterality: Left;  . CYSTOSCOPY W/ URETERAL STENT REMOVAL Right 04/11/2016   Procedure: CYSTOSCOPY WITH STENT REMOVAL;  Surgeon: Vanna Scotland, MD;  Location: [facility];  Service: Urology;  Laterality: Right;  . CYSTOSCOPY WITH RETROGRADE PYELOGRAM, URETEROSCOPY AND STENT PLACEMENT Bilateral 12/10/2014   Procedure: CYSTOSCOPY WITH RETROGRADE PYELOGRAM, URETEROSCOPY AND STENT PLACEMENT;  Surgeon: Hildred Laser, MD;  Location: [facility];  Service: Urology;  Laterality: Bilateral;  . CYSTOSCOPY WITH STENT PLACEMENT Bilateral 12/10/2014   Procedure: CYSTOSCOPY WITH STENT PLACEMENT;  Surgeon: Hildred Laser, MD;  Location: [facility];  Service: Urology;  Laterality: Bilateral;  . CYSTOSCOPY WITH STENT PLACEMENT Bilateral 03/27/2016   Procedure: CYSTOSCOPY WITH STENT PLACEMENT;  Surgeon: Vanna Scotland, MD;  Location: [facility];  Service: Urology;  Laterality: Bilateral;  . EXTRACORPOREAL SHOCK WAVE LITHOTRIPSY Right 01/07/2015   Procedure: EXTRACORPOREAL SHOCK WAVE LITHOTRIPSY (ESWL);  Surgeon: Lorraine Lax, MD;  Location: [facility];  Service: Urology;  Laterality: Right;  . EXTRACORPOREAL SHOCK WAVE LITHOTRIPSY Left 01/21/2015   Procedure: EXTRACORPOREAL SHOCK WAVE LITHOTRIPSY (ESWL);  Surgeon: Hildred Laser, MD;  Location: [facility];  Service: Urology;  Laterality: Left;  . EYE SURGERY  Bilateral    Cataract Extraction with IOL  . HERNIA REPAIR Bilateral    inguinal  . URETEROSCOPY WITH HOLMIUM LASER LITHOTRIPSY Bilateral 02/24/2015   Procedure: URETEROSCOPY WITH HOLMIUM LASER LITHOTRIPSY /STONE BASKETING;  Surgeon: Hildred Laser, MD;  Location:  [facility];  Service: Urology;  Laterality: Bilateral;  . URETEROSCOPY WITH HOLMIUM LASER LITHOTRIPSY Bilateral 04/11/2016   Procedure: URETEROSCOPY WITH HOLMIUM LASER LITHOTRIPSY;  Surgeon: Vanna Scotland, MD;  Location: [facility];  Service: Urology;  Laterality: Bilateral;    SOCIAL HISTORY:   Social History  Substance Use Topics  . Smoking status: Never Smoker  . Smokeless tobacco: Never Used  . Alcohol use No    FAMILY HISTORY:   Family History  Problem Relation Age of Onset  . Heart attack Mother   . Heart attack Father   . Hypertension Unknown   . Kidney disease Neg Hx   . Prostate cancer Neg Hx     DRUG ALLERGIES:   Allergies  Allergen Reactions  . Codeine Other (See Comments)    Causes side effects  . Penicillin G Rash  . Sulfa Antibiotics Rash    REVIEW OF SYSTEMS:  CONSTITUTIONAL: No fever, fatigue or weakness.  EYES: No blurred or double vision.  EARS, NOSE, AND THROAT: No tinnitus or ear pain.Had dizziness this morning but denies any dizziness now. Feels perfectly fine and wants to go home to  RESPIRATORY: No cough, shortness of breath, wheezing or hemoptysis.  CARDIOVASCULAR: No chest pain, orthopnea, edema.  GASTROINTESTINAL: No nausea, vomiting, diarrhea or abdominal pain.  GENITOURINARY: No dysuria, hematuria.  ENDOCRINE: No polyuria, nocturia,  HEMATOLOGY: No anemia, easy bruising or bleeding SKIN: No rash or lesion. MUSCULOSKELETAL: No joint pain or arthritis.   NEUROLOGIC: No tingling, numbness, weakness.  PSYCHIATRY: No anxiety or depression.   MEDICATIONS AT HOME:   Prior to Admission medications   Medication Sig Start Date End Date Taking? Authorizing Provider  apixaban (ELIQUIS) 2.5 MG TABS tablet Take 2.5 mg by mouth 2 (two) times daily.    Yes [provider]  hydrochlorothiazide (HYDRODIURIL) 25 MG tablet Take 25 mg by mouth daily. 04/12/16  Yes [provider]  metoprolol succinate (TOPROL-XL) 25 MG 24 hr tablet Take 25  mg by mouth daily.   Yes [provider]  ondansetron (ZOFRAN ODT) 4 MG disintegrating tablet Take 1 tablet (4 mg total) by mouth every 4 (four) hours as needed for nausea or vomiting. 04/12/16  Yes McGowan, Carollee Herter A, PA-C  pantoprazole (PROTONIX) 40 MG tablet Take 40 mg by mouth daily.   Yes [provider]  rivastigmine (EXELON) 3 MG capsule Take 3 mg by mouth 2 (two) times daily.    Yes [provider]  simvastatin (ZOCOR) 20 MG tablet Take 20 mg by mouth daily.   Yes [provider]  ertapenem 1 g in sodium chloride 0.9 % 50 mL Inject 1 g into the vein daily. 04/15/16   Shaune Pollack, MD  traMADol (ULTRAM) 50 MG tablet Take 1 tablet (50 mg total) by mouth every 6 (six) hours as needed for moderate pain. Patient not taking: Reported on 07/07/2016 03/28/16   Katharina Caper, MD      VITAL SIGNS:  Blood pressure 123/78, pulse 82, temperature 98 F (36.7 C), resp. rate 15, height 5\' 6"  (1.676 m), weight 68.9 kg (152 lb), SpO2 97 %.  PHYSICAL EXAMINATION:  GENERAL:  83 y.o.-year-old patient lying in the bed with no acute distress.  EYES: Pupils equal, round,  reactive to light and accommodation. No scleral icterus. Extraocular muscles intact.  HEENT: Head atraumatic, normocephalic. Oropharynx and nasopharynx clear.  NECK:  Supple, no jugular venous distention. No thyroid enlargement, no tenderness.  LUNGS: Normal breath sounds bilaterally, no wheezing, rales,rhonchi or crepitation. No use of accessory muscles of respiration.  CARDIOVASCULAR: S1, S2 normal. No murmurs, rubs, or gallops.  ABDOMEN: Soft, nontender, nondistended. Bowel sounds present. No organomegaly or mass.  EXTREMITIES: No pedal edema, cyanosis, or clubbing.  NEUROLOGIC: Cranial nerves II through XII are intact. Muscle strength 5/5 in all extremities. Sensation intact. Gait not checked.  PSYCHIATRIC: The patient is alert and oriented x 3.  SKIN: No obvious rash, lesion, or ulcer.   LABORATORY  PANEL:   CBC  Recent Labs Lab 07/07/16 1557  WBC 14.0*  HGB 16.7  HCT 49.6  PLT 284   ------------------------------------------------------------------------------------------------------------------  Chemistries   Recent Labs Lab 07/07/16 1557  NA 137  K 3.3*  CL 103  CO2 26  GLUCOSE 209*  BUN 23*  CREATININE 1.31*  CALCIUM 9.6  AST 29  ALT 16*  ALKPHOS 75  BILITOT 0.9   ------------------------------------------------------------------------------------------------------------------  Cardiac Enzymes  Recent Labs Lab 07/07/16 1557  TROPONINI <0.03   ------------------------------------------------------------------------------------------------------------------  RADIOLOGY:  Ct Head Code Stroke W/o Cm  Result Date: 07/07/2016 CLINICAL DATA:  Code stroke. Increased confusion, nausea, blurred vision, and dizziness beginning at 11 a.m. today, 5 hours ago. Initial encounter. Intracranial hemorrhage 2 months ago. EXAM: CT HEAD WITHOUT CONTRAST TECHNIQUE: Contiguous axial images were obtained from the base of the skull through the vertex without intravenous contrast. COMPARISON:  CTA head and neck 06/06/2016. FINDINGS: Brain: Encephalomalacia is noted within the left occipital lobe in the area of previous hemorrhage. This is expected evolution. A new lucency is present in the right corona radiata anterior to the precentral sulcus. There is no cortical involvement. Mild generalized atrophy is otherwise stable. Brainstem is within normal limits. The cerebellum is stable. No acute hemorrhage or mass lesion is present. No significant extra-axial fluid collection is present. Vascular: The vessels are somewhat hyperdense, but symmetric. Atherosclerotic calcifications are present in the cavernous internal carotid artery's as before. Skull: The calvarium is intact. No focal lytic or blastic lesions are present. Sinuses/Orbits: The paranasal sinuses and mastoid air cells are clear.  Bilateral lens replacements are present. The globes and orbits are within normal limits. ASPECTS (Alberta Stroke Program Early CT Score) - Ganglionic level infarction (caudate, lentiform nuclei, internal capsule, insula, M1-M3 cortex): 7/7 - Supraganglionic infarction (M4-M6 cortex): 3/3 Total score (0-10 with 10 being normal): 10/10 IMPRESSION: 1. New 7 mm white matter hypodensity in the anterior right frontal lobe, anterior to the precentral gyrus. This may represent an acute/subacute white matter infarct. 2. No acute cortical lesion. 3. Expected evolution of encephalomalacia involving the left occipital lobe, the site of prior hemorrhage. 4. ASPECTS is 10/10 These results were called by telephone at the time of interpretation on 07/07/2016 at 4:20 pm to Dr. Minna Antis , who verbally acknowledged these results. Electronically Signed   By: Marin Roberts M.D.   On: 07/07/2016 16:22    EKG:   Orders placed or performed during the hospital encounter of 07/07/16  . ED EKG  . ED EKG  . EKG 12-Lead  . EKG 12-Lead   EKG shows atrial fibrillation at 83 bpm with T-wave inversion in V1, V2.  IMPRESSION AND PLAN:   #[age] year old male patient with dizziness, nausea. He came to ER because of concern for  possible stroke. Stroke pathway initiated, [facility], initial NIH stroke scale 0./. Asian head CAT scan showed new 7 mm hypodensity in the anterior right frontal lobe concerning for acute/subacute white matter infarct. Patient has left occipital encephalomalacia at the site of prior hemorrhage. MRI of the brain, MRA of the brain, if MRA of the brain is positive for stroke consult neurology. Admitted to stroke unit.  2,chronic afib; atrial fibrillation: Continue apixiban  All the records are reviewed and case discussed with ED provider. Management plans discussed with the patient, family and they are in agreement.  CODE STATUS: full  TOTAL TIME TAKING CARE OF THIS PATIENT: 55  minutes.    Katha HammingKONIDENA,Lygia Olaes M.D on 07/07/2016 at 6:37 PM  Between 7am to 6pm - Pager - [phone]  After 6pm go to [email or web address] - password [email or web address] [facility]  [facility]  Office  [phone]  CC: Primary care physician; Marguarite ArbourSparks, Jeffrey D, MD  Note: This dictation was prepared with Dragon dictation along with smaller phrase technology. Any transcriptional errors that result from this process are unintentional.

## 2016-07-07 NOTE — ED Notes (Signed)
Code  Stroke  Called  To [phone] 

## 2016-07-07 NOTE — ED Notes (Signed)
Soc  Report  Given  To  MD

## 2016-07-07 NOTE — ED Notes (Signed)
Attempt to call report, on hold with floor.

## 2016-07-07 NOTE — Progress Notes (Signed)
Patient admission profile completed. Family at bedside. Pt denies pain at present. High fall risk, exit alarm activated. NIH is "0". Neuro assessment- WNL.

## 2016-07-07 NOTE — ED Triage Notes (Signed)
Pt with increased confusion, nausea, blurred vision and dizziness since 11am today. [facility].

## 2016-07-07 NOTE — ED Provider Notes (Signed)
[facility] Emergency Department Provider Note  ____________________________________________   First MD Initiated Contact with Patient 07/07/16 1710     (approximate)  I have reviewed the triage vital signs and the nursing notes.   HISTORY  Chief Complaint Code Stroke   HPI William RegulusJames B Dubs Sr. is a 83 y.o. male whose family brings him to the emergency department with confusion and nausea blurred vision and dizziness that began around 11 AM today. [facility] and he was sent here for further evaluation. 2 months ago the patient had an intraparenchymal hemorrhage and [facility]. He has restarted his Eliquis. He denies particular numbness or weakness. He denies headache. He denies any sort of pain. The symptoms began suddenly have not progressed have been constant. Nothing makes it better or worse.   Past Medical History:  Diagnosis Date  . Arthritis   . Atrial fibrillation (HCC) 2016   paroxysmal atrial fibrillation, sick sinus syndrome  . Complication of anesthesia    affects memory for a longer time after  . Dementia    alzheimers  . Essential hypertension   . GERD (gastroesophageal reflux disease)   . History of kidney stones   . Hyperlipemia   . Inguinal hernia recurrent bilateral   . Kidney stones     Patient Active Problem List   Diagnosis Date Noted  . Urolithiasis 03/28/2016  . Pyuria 03/28/2016  . Hydronephrosis 03/27/2016  . Cardiac arrhythmia 07/22/2015  . Atrial fibrillation with RVR (HCC) 02/24/2015  . Cardiac conduction disorder 02/04/2015  . Arthritis 02/04/2015  . Atrial fibrillation (HCC) 02/04/2015  . Acid reflux 02/04/2015  . H/O vertigo 02/04/2015  . HLD (hyperlipidemia) 02/04/2015  . BP (high blood pressure) 02/04/2015  . Calculus of kidney 02/04/2015  . Sick sinus syndrome (HCC) 02/04/2015  . Fungal infection of nail 02/04/2015  . Type 2 diabetes mellitus (HCC) 02/04/2015  .  Sepsis (HCC) 12/10/2014  . Systemic infection (HCC) 12/10/2014  . Sepsis(995.91) 12/10/2014  . Altered bowel function 10/13/2014  . Chronic diarrhea 10/13/2014  . Change in bowel habits 10/13/2014  . Senile dementia of Alzheimer's type 09/20/2014  . Alzheimer's dementia, late onset 09/20/2014  . Drug resistance 10/22/2013    Past Surgical History:  Procedure Laterality Date  . CYSTOSCOPY W/ RETROGRADES Bilateral 03/27/2016   Procedure: CYSTOSCOPY WITH RETROGRADE PYELOGRAM;  Surgeon: [name], MD;  Location: [facility] ORS;  Service: Urology;  Laterality: Bilateral;  . CYSTOSCOPY W/ URETERAL STENT PLACEMENT Bilateral 02/24/2015   Procedure: CYSTOSCOPY WITH STENT REPLACEMENT;  Surgeon: Hildred LaserBrian Tyrek Budzyn, MD;  Location: [facility] OR[facility];  Service: Urology;  Laterality: Bilateral;  . CYSTOSCOPY W/ URETERAL STENT PLACEMENT Left 04/11/2016   Procedure: CYSTOSCOPY WITH STENT REPLACEMENT;  Surgeon: [name], MD;  Location: [facility] ORS;  Service: Urology;  Laterality: Left;  . CYSTOSCOPY W/ URETERAL STENT REMOVAL Right 04/11/2016   Procedure: CYSTOSCOPY WITH STENT REMOVAL;  Surgeon: [name], MD;  Location: [facility] OR[facility];  Service: Urology;  Laterality: Right;  . CYSTOSCOPY WITH RETROGRADE PYELOGRAM, URETEROSCOPY AND STENT PLACEMENT Bilateral 12/10/2014   Procedure: CYSTOSCOPY WITH RETROGRADE PYELOGRAM, URETEROSCOPY AND STENT PLACEMENT;  Surgeon: Hildred LaserBrian Dell Budzyn, MD;  Location: [facility] OR[facility];  Service: Urology;  Laterality: Bilateral;  . CYSTOSCOPY WITH STENT PLACEMENT Bilateral 12/10/2014   Procedure: CYSTOSCOPY WITH STENT PLACEMENT;  Surgeon: Hildred LaserBrian Devontre Budzyn, MD;  Location: [facility] OR[facility];  Service: Urology;  Laterality: Bilateral;  . CYSTOSCOPY WITH STENT PLACEMENT Bilateral 03/27/2016   Procedure: CYSTOSCOPY  WITH STENT PLACEMENT;  Surgeon: Vanna Scotland, MD;  Location: [facility] OR[facility];  Service: Urology;  Laterality: Bilateral;  . EXTRACORPOREAL SHOCK WAVE LITHOTRIPSY Right 01/07/2015   Procedure: EXTRACORPOREAL  SHOCK WAVE LITHOTRIPSY (ESWL);  Surgeon: Lorraine Lax, MD;  Location: [facility] OR[facility];  Service: Urology;  Laterality: Right;  . EXTRACORPOREAL SHOCK WAVE LITHOTRIPSY Left 01/21/2015   Procedure: EXTRACORPOREAL SHOCK WAVE LITHOTRIPSY (ESWL);  Surgeon: Hildred Laser, MD;  Location: [facility] OR[facility];  Service: Urology;  Laterality: Left;  . EYE SURGERY Bilateral    Cataract Extraction with IOL  . HERNIA REPAIR Bilateral    inguinal  . URETEROSCOPY WITH HOLMIUM LASER LITHOTRIPSY Bilateral 02/24/2015   Procedure: URETEROSCOPY WITH HOLMIUM LASER LITHOTRIPSY /STONE BASKETING;  Surgeon: Hildred Laser, MD;  Location: [facility];  Service: Urology;  Laterality: Bilateral;  . URETEROSCOPY WITH HOLMIUM LASER LITHOTRIPSY Bilateral 04/11/2016   Procedure: URETEROSCOPY WITH HOLMIUM LASER LITHOTRIPSY;  Surgeon: Vanna Scotland, MD;  Location: [facility];  Service: Urology;  Laterality: Bilateral;    Prior to Admission medications   Medication Sig Start Date End Date Taking? Authorizing Provider  apixaban (ELIQUIS) 2.5 MG TABS tablet Take 2.5 mg by mouth 2 (two) times daily.    Yes [provider]  hydrochlorothiazide (HYDRODIURIL) 25 MG tablet Take 25 mg by mouth daily. 04/12/16  Yes [provider]  metoprolol succinate (TOPROL-XL) 25 MG 24 hr tablet Take 25 mg by mouth daily.   Yes [provider]  ondansetron (ZOFRAN ODT) 4 MG disintegrating tablet Take 1 tablet (4 mg total) by mouth every 4 (four) hours as needed for nausea or vomiting. 04/12/16  Yes McGowan, Carollee Herter A, PA-C  pantoprazole (PROTONIX) 40 MG tablet Take 40 mg by mouth daily.   Yes [provider]  rivastigmine (EXELON) 3 MG capsule Take 3 mg by mouth 2 (two) times daily.    Yes [provider]  simvastatin (ZOCOR) 20 MG tablet Take 20 mg by mouth daily.   Yes [provider]  ertapenem 1 g in sodium chloride 0.9 % 50 mL Inject 1 g into the vein daily. 04/15/16   Shaune Pollack, MD  traMADol (ULTRAM) 50 MG  tablet Take 1 tablet (50 mg total) by mouth every 6 (six) hours as needed for moderate pain. Patient not taking: Reported on 07/07/2016 03/28/16   Katharina Caper, MD    Allergies Codeine; Penicillin g; and Sulfa antibiotics  Family History  Problem Relation Age of Onset  . Heart attack Mother   . Heart attack Father   . Hypertension Unknown   . Kidney disease Neg Hx   . Prostate cancer Neg Hx     Social History Social History  Substance Use Topics  . Smoking status: Never Smoker  . Smokeless tobacco: Never Used  . Alcohol use No    Review of Systems Constitutional: No fever/chills Eyes: No visual changes. ENT: No sore throat. Cardiovascular: Denies chest pain. Respiratory: Denies shortness of breath. Gastrointestinal: No abdominal pain.  No nausea, no vomiting.  No diarrhea.  No constipation. Genitourinary: Negative for dysuria. Musculoskeletal: Negative for back pain. Skin: Negative for rash. Neurological: Negative for headaches, focal weakness or numbness.   ____________________________________________   PHYSICAL EXAM:  VITAL SIGNS: ED Triage Vitals  Enc Vitals Group     BP 07/07/16 1600 (!) 148/75     Pulse Rate 07/07/16 1559 88     Resp 07/07/16 1559 16     Temp 07/07/16 1559 98 F (36.7 C)  Temp Source 07/07/16 1559 Oral     SpO2 07/07/16 1559 99 %     Weight 07/07/16 1559 152 lb (68.9 kg)     Height 07/07/16 1559 5\' 6"  (1.676 m)     Head Circumference --      Peak Flow --      Pain Score 07/07/16 1559 0     Pain Loc --      Pain Edu? --      Excl. in GC? --     Constitutional: No acute distress pleasant cooperative clearly confused Eyes: PERRL EOMI. Head: Atraumatic. Nose: No congestion/rhinnorhea. Mouth/Throat: No trismus Neck: No stridor.   Cardiovascular: Normal rate, regular rhythm. Grossly normal heart sounds.  Good peripheral circulation. Respiratory: Normal respiratory effort.  No retractions. Lungs CTAB and moving good  air Gastrointestinal: Soft nontender Musculoskeletal: No lower extremity edema   Neurologic:  Cranial nerves II through XII intact no pronator drift 5 out of 5 all 4 sensation intact to light touch all 4 Skin:  Skin is warm, dry and intact. No rash noted.    ____________________________________________   DIFFERENTIAL  Ischemic stroke, hemorrhagic stroke, metabolic derangement, dysrhythmia ____________________________________________   LABS (all labs ordered are listed, but only abnormal results are displayed)  Labs Reviewed  CBC - Abnormal; Notable for the following:       Result Value   WBC 14.0 (*)    RDW 15.4 (*)    All other components within normal limits  DIFFERENTIAL - Abnormal; Notable for the following:    Neutro Abs 12.2 (*)    All other components within normal limits  COMPREHENSIVE METABOLIC PANEL - Abnormal; Notable for the following:    Potassium 3.3 (*)    Glucose, Bld 209 (*)    BUN 23 (*)    Creatinine, Ser 1.31 (*)    ALT 16 (*)    GFR calc non Af Amer 49 (*)    GFR calc Af Amer 57 (*)    All other components within normal limits  GLUCOSE, CAPILLARY - Abnormal; Notable for the following:    Glucose-Capillary 196 (*)    All other components within normal limits  PROTIME-INR  APTT  TROPONIN I  CBG MONITORING, ED    No signs of acute ischemia labs unremarkable __________________________________________  EKG  ED ECG REPORT I, Merrily Brittle, the attending physician, personally viewed and interpreted this ECG.  Date: 07/07/2016 Rate: 83 Rhythm: Atrial fibrillation QRS Axis: normal Intervals: normal ST/T Wave abnormalities: normal Conduction Disturbances: none Narrative Interpretation: Abnormal  ____________________________________________  RADIOLOGY  Head CT showing subacute infarct ____________________________________________   PROCEDURES  Procedure(s) performed: no  Procedures  Critical Care performed: yes  CRITICAL  CARE Performed by: Merrily Brittle   Total critical care time: 35 minutes  Critical care time was exclusive of separately billable procedures and treating other patients.  Critical care was necessary to treat or prevent imminent or life-threatening deterioration.  Critical care was time spent personally by me on the following activities: development of treatment plan with patient and/or surrogate as well as nursing, discussions with consultants, evaluation of patient's response to treatment, examination of patient, obtaining history from patient or surrogate, ordering and performing treatments and interventions, ordering and review of laboratory studies, ordering and review of radiographic studies, pulse oximetry and re-evaluation of patient's condition.   Observation: no ____________________________________________   INITIAL IMPRESSION / ASSESSMENT AND PLAN / ED COURSE  Pertinent labs & imaging results that were available during my care of  the patient were reviewed by me and considered in my medical decision making (see chart for details).  On arrival code stroke was called as the patient had acute neuro symptoms. He did have a recent intracerebral hemorrhage however so he is not a TPA candidate. We do not have in-house neurology at this time so we called the specialist on call who confirmed that the patient appears to have a subacute infarct although his N at age stroke scale at this point is 0. She is recommending MRI and MRA of the head and neck in inpatient admission. He may very well have to switch from Eliquis to another anticoagulant as he had a stroke while anticoagulated.    ____________________________________________   FINAL CLINICAL IMPRESSION(S) / ED DIAGNOSES  Final diagnoses:  Cerebrovascular accident (CVA), unspecified mechanism (HCC)  Stroke (HCC)      NEW MEDICATIONS STARTED DURING THIS VISIT:  New Prescriptions   No medications on file     Note:  This  document was prepared using Dragon voice recognition software and may include unintentional dictation errors.     Merrily Brittle, MD 07/07/16 [phone]

## 2016-07-07 NOTE — Progress Notes (Signed)
CH responded to a PG for a Code Stroke. Pt was being attended to by the RN. Son is bedside. Pt seemed anxious, noting that he had the same symptoms a few months ago with bleeding in his brain. Son is calm and reassuring to his father. Contacts were made. CH offered prayer. Seeing they were in a good space, I excused myself. CH is available for follow up as needed.    07/07/16 1700  Clinical Encounter Type  Visited With Patient;Patient and family together;Health care provider  Visit Type Initial;Spiritual support;Code;ED (Code Stroke)  Referral From Nurse  Consult/Referral To Chaplain  Spiritual Encounters  Spiritual Needs Prayer;Emotional

## 2016-07-07 NOTE — ED Notes (Signed)
Patient transported to MRI 

## 2016-07-07 NOTE — ED Notes (Signed)
Report from amber, pt in mri

## 2016-07-08 ENCOUNTER — Inpatient Hospital Stay: Payer: Medicare Other

## 2016-07-08 DIAGNOSIS — I639 Cerebral infarction, unspecified: Principal | ICD-10-CM

## 2016-07-08 LAB — LIPID PANEL
Cholesterol: 147 mg/dL (ref 0–200)
HDL: 26 mg/dL — ABNORMAL LOW
LDL Cholesterol: 96 mg/dL (ref 0–99)
Total CHOL/HDL Ratio: 5.7 ratio
Triglycerides: 126 mg/dL
VLDL: 25 mg/dL (ref 0–40)

## 2016-07-08 LAB — GLUCOSE, CAPILLARY
GLUCOSE-CAPILLARY: 112 mg/dL — AB (ref 65–99)
Glucose-Capillary: 123 mg/dL — ABNORMAL HIGH (ref 65–99)

## 2016-07-08 MED ORDER — RAMELTEON 8 MG PO TABS
8.0000 mg | ORAL_TABLET | Freq: Every day | ORAL | Status: DC
  Administered 2016-07-08 (×2): 8 mg via ORAL
  Filled 2016-07-08 (×3): qty 1

## 2016-07-08 MED ORDER — DICLOFENAC SODIUM 1 % TD GEL
2.0000 g | Freq: Four times a day (QID) | TRANSDERMAL | Status: DC | PRN
  Administered 2016-07-08: 18:00:00 2 g via TOPICAL
  Filled 2016-07-08: qty 100

## 2016-07-08 NOTE — Evaluation (Signed)
Physical Therapy Evaluation Patient Details Name: William PigeonJames B Goodin Sr. MRN: [id number] DOB: 10/27/1932 Today's Date: 07/08/2016   History of Present Illness  83 y/o male here with dizziness and vision issues, also had some R UE numbness a few days ago.  Pt's symptoms have resoloved - pt eager to go home.  Clinical Impression  Pt did well with PT, reports he has no residual symptoms, testing reveals full function that he states is at his baseline.  He was able to walk around the nurses station X4 and likely could have done 4 more w/o issue.  Pt safe, confident and generally independent with all testing.  Pt does not require further PT intervention here or upon discharge.    Follow Up Recommendations No PT follow up    Equipment Recommendations       Recommendations for Other Services       Precautions / Restrictions Precautions Precautions: Fall Restrictions Weight Bearing Restrictions: No      Mobility  Bed Mobility Overal bed mobility: Independent                Transfers Overall transfer level: Independent               General transfer comment: Pt easily able to get himself to EOB and up to standing w/o assist  Ambulation/Gait Ambulation/Gait assistance: Modified independent (Device/Increase time) Ambulation Distance (Feet): 700 Feet Assistive device: None       General Gait Details: Pt with consistent, confident gait. No safety issues, no fatigue.  Stairs            Wheelchair Mobility    Modified Rankin (Stroke Patients Only)       Balance Overall balance assessment: Independent                                           Pertinent Vitals/Pain Pain Assessment:  (R low back (kidney?) pain that resolved with walking)    Home Living Family/patient expects to be discharged to:: Private residence Living Arrangements: Spouse/significant other Available Help at Discharge: Friend(s) Type of Home: House Home Access: Stairs  to enter Entrance Stairs-Rails: Right;Left;Can reach both Entrance Stairs-Number of Steps: 3 Home Layout: One level        Prior Function Level of Independence: Independent         Comments: Pt active walking; denies any falls in past 6 months.     Hand Dominance        Extremity/Trunk Assessment   Upper Extremity Assessment Upper Extremity Assessment: Defer to OT evaluation    Lower Extremity Assessment Lower Extremity Assessment: Overall WFL for tasks assessed (equal bilaterally)       Communication   Communication: No difficulties  Cognition Arousal/Alertness: Awake/alert Behavior During Therapy: WFL for tasks assessed/performed Overall Cognitive Status: Within Functional Limits for tasks assessed                                        General Comments      Exercises     Assessment/Plan    PT Assessment Patent does not need any further PT services  PT Problem List         PT Treatment Interventions      PT Goals (Current goals can be found in the  Care Plan section)  Acute Rehab PT Goals PT Goal Formulation: All assessment and education complete, DC therapy    Frequency     Barriers to discharge        Co-evaluation               AM-PAC PT "6 Clicks" Daily Activity  Outcome Measure Difficulty turning over in bed (including adjusting bedclothes, sheets and blankets)?: None Difficulty moving from lying on back to sitting on the side of the bed? : None Difficulty sitting down on and standing up from a chair with arms (e.g., wheelchair, bedside commode, etc,.)?: None Help needed moving to and from a bed to chair (including a wheelchair)?: None Help needed walking in hospital room?: None Help needed climbing 3-5 steps with a railing? : None 6 Click Score: 24    End of Session Equipment Utilized During Treatment: Gait belt Activity Tolerance: Patient tolerated treatment well Patient left: with chair alarm set;with call  bell/phone within reach;with family/visitor present   PT Visit Diagnosis: Muscle weakness (generalized) (M62.81)    Time: 9604-5409 PT Time Calculation (min) (ACUTE ONLY): 18 min   Charges:   PT Evaluation $PT Eval Low Complexity: 1 Procedure     PT G CodesMalachi Lozano, DPT 07/08/2016, 12:51 PM

## 2016-07-08 NOTE — Evaluation (Signed)
Occupational Therapy Evaluation Patient Details Name: William VIRGIL Sr. MRN: [id number] DOB: 1932/07/06 Today's Date: 07/08/2016    History of Present Illness 83 y/o male here with dizziness and vision issues, also had some R UE numbness a few days ago.  Pt's symptoms have resoloved - pt eager to go home.   Clinical Impression   Patient was supine in bed when OT arrived. Family at bedside. Patient eager and willing to participate in all tasks. Patient noted to have some difficulties with delayed recall, but patient and family state this is baseline. Patient A/O x4. Able to perform bed mobility and transfers without assist. Able to demonstrate basic self care tasks of grooming standing and sink and lower body dressing. No deficits with balance noted during tasks. Educated patient and family on need for general supervision for safety when he first got home to assist with transition. Patient and family in agreement.    Follow Up Recommendations  No OT follow up    Equipment Recommendations       Recommendations for Other Services       Precautions / Restrictions Precautions Precautions: Fall Restrictions Weight Bearing Restrictions: No      Mobility Bed Mobility Overal bed mobility: Independent                Transfers Overall transfer level: Independent               General transfer comment: Pt easily able to get himself to EOB and up to standing w/o assist    Balance Overall balance assessment: Independent                                         ADL either performed or assessed with clinical judgement   ADL Overall ADL's : At baseline                                             Vision Baseline Vision/History: Wears glasses Wears Glasses: At all times Patient Visual Report:  (patient states vision was blurry on the edges, but has improved since yesterday.) Vision Assessment?: No apparent visual deficits      Perception     Praxis      Pertinent Vitals/Pain Pain Assessment: Faces Faces Pain Scale: Hurts whole lot Pain Location: lower back on right side Pain Descriptors / Indicators: Aching;Burning Pain Intervention(s): Repositioned (patient states walking decreases pain)     Hand Dominance Right   Extremity/Trunk Assessment Upper Extremity Assessment Upper Extremity Assessment: Overall WFL for tasks assessed   Lower Extremity Assessment Lower Extremity Assessment: Overall WFL for tasks assessed       Communication Communication Communication: No difficulties   Cognition Arousal/Alertness: Awake/alert Behavior During Therapy: WFL for tasks assessed/performed Overall Cognitive Status: History of cognitive impairments - at baseline                                 General Comments:  (difficulty with delayed recall of information.)   General Comments       Exercises     Shoulder Instructions      Home Living Family/patient expects to be discharged to:: Private residence Living Arrangements: Spouse/significant other Available Help at Discharge:  Friend(s) Type of Home: House Home Access: Stairs to enter Entergy CorporationEntrance Stairs-Number of Steps: 3 Entrance Stairs-Rails: Right;Left;Can reach both Home Layout: One level     Bathroom Shower/Tub: Runner, broadcasting/film/videoWalk-in shower         Home Equipment: None          Prior Functioning/Environment Level of Independence: Independent        Comments: Pt active walking; denies any falls in past 6 months.        OT Problem List: Impaired vision/perception      OT Treatment/Interventions:      OT Goals(Current goals can be found in the care plan section) Acute Rehab OT Goals Patient Stated Goal: Go home OT Goal Formulation: With patient/family Time For Goal Achievement: 07/15/16 Potential to Achieve Goals: Good  OT Frequency:     Barriers to D/C:            Co-evaluation              AM-PAC PT "6 Clicks"  Daily Activity     Outcome Measure Help from another person eating meals?: None Help from another person taking care of personal grooming?: None Help from another person toileting, which includes using toliet, bedpan, or urinal?: None Help from another person bathing (including washing, rinsing, drying)?: None Help from another person to put on and taking off regular upper body clothing?: None Help from another person to put on and taking off regular lower body clothing?: None 6 Click Score: 24   End of Session    Activity Tolerance: Patient tolerated treatment well Patient left: in bed;with family/visitor present;with call bell/phone within reach;with bed alarm set  OT Visit Diagnosis: Low vision, both eyes (H54.2);Other symptoms and signs involving the nervous system (R29.898)                Time: 1010-1035 OT Time Calculation (min): 25 min Charges:  OT Evaluation $OT Eval Low Complexity: 1 Procedure OT Treatments $Self Care/Home Management : 8-22 mins G-Codes: OT G-codes **NOT FOR INPATIENT CLASS** Functional Assessment Tool Used: AM-PAC 6 Clicks Daily Activity   Kirstie PeriWendy Michelangelo Rindfleisch, OTR/L  Sameera Betton L 07/08/2016, 1:08 PM

## 2016-07-08 NOTE — Progress Notes (Signed)
SLP Cancellation Note  Patient Details Name: William BOHMAN Sr. MRN: [id number] DOB: [birth year]   Cancelled treatment:       Reason Eval/Treat Not Completed: SLP screened, no needs identified, will sign off (chart reviewed; NSG consulted; met w/ pt/family).  Pt denied any difficulty swallowing and is currently on a regular diet; tolerates swallowing pills w/ water per NSG. Pt conversed at conversational level w/out new deficits noted; pt and family stated it takes pt "a little extra time during conversation sometimes - pt does have a baseline of Dementia and previous CVA both of which can impact cognitive-linguistic skills. Pt stated he was to f/u w/ someone "from [facility]"; suggested to pt/family to follow through w/ this as needed and recommended.  No further skilled ST services indicated as pt appears grossly at his baseline. Pt agreed. NSG to reconsult if any change in status while admitted.     Orinda Kenner, [place], CCC-SLP Watson,Katherine 07/08/2016, 10:20 AM

## 2016-07-08 NOTE — Progress Notes (Addendum)
[facility] [place] [facility] [place]   PATIENT NAME: William Lozano    MR#:  [id number]  DATE OF BIRTH:  [birth year]  SUBJECTIVE:  Came in with dizziness and not feeling well  REVIEW OF SYSTEMS:   Review of Systems  Constitutional: Negative for chills, fever and weight loss.  HENT: Negative for ear discharge, ear pain and nosebleeds.   Eyes: Negative for blurred vision, pain and discharge.  Respiratory: Negative for sputum production, shortness of breath, wheezing and stridor.   Cardiovascular: Negative for chest pain, palpitations, orthopnea and PND.  Gastrointestinal: Negative for abdominal pain, diarrhea, nausea and vomiting.  Genitourinary: Negative for frequency and urgency.  Musculoskeletal: Negative for back pain and joint pain.  Neurological: Positive for dizziness and weakness. Negative for sensory change, speech change and focal weakness.  Psychiatric/Behavioral: Negative for depression and hallucinations. The patient is not nervous/anxious.    Tolerating Diet:yes Tolerating PT: PT  DRUG ALLERGIES:   Allergies  Allergen Reactions  . Codeine Other (See Comments)    Causes side effects  . Penicillin G Rash  . Sulfa Antibiotics Rash    VITALS:  Blood pressure 102/61, pulse 67, temperature 97.9 F (36.6 C), temperature source Oral, resp. rate 16, height 5\' 9"  (1.753 m), weight 73 kg (160 lb 14.4 oz), SpO2 97 %.  PHYSICAL EXAMINATION:   Physical Exam  GENERAL:  83 y.o.-year-old patient lying in the bed with no acute distress.  EYES: Pupils equal, round, reactive to light and accommodation. No scleral icterus. Extraocular muscles intact.  HEENT: Head atraumatic, normocephalic. Oropharynx and nasopharynx clear.  NECK:  Supple, no jugular venous distention. No thyroid enlargement, no tenderness.  LUNGS: Normal breath sounds bilaterally, no wheezing, rales, rhonchi. No use of accessory muscles of respiration.  CARDIOVASCULAR: S1, S2 normal. No  murmurs, rubs, or gallops.  ABDOMEN: Soft, nontender, nondistended. Bowel sounds present. No organomegaly or mass.  EXTREMITIES: No cyanosis, clubbing or edema b/l.    NEUROLOGIC: Cranial nerves II through XII are intact. No focal Motor or sensory deficits b/l.   PSYCHIATRIC:  patient is alert and oriented x 3.  SKIN: No obvious rash, lesion, or ulcer.   LABORATORY PANEL:  CBC  Recent Labs Lab 07/07/16 1557  WBC 14.0*  HGB 16.7  HCT 49.6  PLT 284    Chemistries   Recent Labs Lab 07/07/16 1557  NA 137  K 3.3*  CL 103  CO2 26  GLUCOSE 209*  BUN 23*  CREATININE 1.31*  CALCIUM 9.6  AST 29  ALT 16*  ALKPHOS 75  BILITOT 0.9   Cardiac Enzymes  Recent Labs Lab 07/07/16 1557  TROPONINI <0.03   RADIOLOGY:  Mr Angiogram Head Wo Contrast  Result Date: 07/07/2016 CLINICAL DATA:  Confusion, blurry vision and dizziness beginning this morning. History of intra cranial hemorrhage 2 months ago, hypertension, hyperlipidemia, dementia. EXAM: MRI HEAD WITHOUT CONTRAST MRA HEAD WITHOUT CONTRAST MRA NECK WITHOUT CONTRAST TECHNIQUE: Multiplanar, multiecho pulse sequences of the brain and surrounding structures were obtained without intravenous contrast. Angiographic images of the Circle of Willis were obtained using MRA technique without intravenous contrast. Angiographic images of the neck were obtained using MRA technique without intravenous contrast. Carotid stenosis measurements (when applicable) are obtained utilizing NASCET criteria, using the distal internal carotid diameter as the denominator. COMPARISON:  CT HEAD Jul 07, 2016 at 1607 hours in CT angiogram of the head and neck June 06, 2016 FINDINGS: MRI HEAD FINDINGS BRAIN: Subcentimeter focus of reduced diffusion RIGHT occipital  lobe with T2 shine through. LEFT frontal periventricular 6 mm lesion with low ADC values. Reduced diffusion, susceptibility artifact, it intermediate T1 and bright FLAIR signal LEFT occipital lobe measuring  2.2 x 1.5 cm consistent with subacute to chronic hemorrhage. A few punctate chronic micro hemorrhages. 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 Moderate to severe ventricular on the basis of global parenchymal brain volume loss. A few scattered punctate supratentorial white matter FLAIR T2 hyperintensities are less than expected for age. No midline shift or mass effect. No abnormal extra-axial fluid collections. VASCULAR: Normal major intracranial vascular flow voids present at skull base. SKULL AND UPPER CERVICAL SPINE: No abnormal sellar expansion. No suspicious calvarial bone marrow signal. Craniocervical junction maintained. SINUSES/ORBITS: The trace paranasal sinus mucosal thickening. Mastoid air cells are well aerated. The included ocular globes and orbital contents are non-suspicious. Status post bilateral ocular lens implants. OTHER: None. MRA HEAD FINDINGS- due to porous signal to noise ratio, poor characterization of circle of Willis and distal vessels. ANTERIOR CIRCULATION: Decreased flow related enhancement LEFT internal carotid artery, normal in caliber and, normal through the cavernous segment. Normal flow related enhancement RIGHT internal carotid artery. Bilateral A2 segments arise from RIGHT A1-2 junction. Bilateral anterior middle cerebral arteries are patent. POSTERIOR CIRCULATION: Codominant. Basilar artery is patent, with normal flow related enhancement of the main branch vessels. Normal flow related enhancement of the posterior cerebral arteries. ANATOMIC VARIANTS: Aplastic LEFT A1 segment. MRA NECK FINDINGS- limited time-of-flight technique, general poor signal to noise ratio. ANTERIOR CIRCULATION: The common carotid arteries are widely patent bilaterally. The carotid bifurcations are patent bilaterally without hemodynamically significant stenosis by NASCET criteria. No flow limiting stenosis or luminal irregularity. POSTERIOR CIRCULATION: Bilateral vertebral arteries are patent to the  vertebrobasilar junction. No flow limiting stenosis or luminal irregularity. Source images and MIP images were reviewed. IMPRESSION: MRI HEAD: Acute 6 mm LEFT frontal periventricular infarct. Subacute to old RIGHT frontal lobe infarct corresponding to CT abnormality. Subacute LEFT occipital intraparenchymal 2.2 x 1.5 cm hematoma. Moderate to severe parenchymal brain volume loss. MRA HEAD: Limited assessment due to poor signal to noise ratio without emergent large vessel occlusion. MRA NECK: Limited assessment due to poor signal to noise ratio without significant stenosis. Electronically Signed   By: Awilda Metro M.D.   On: 07/07/2016 19:35   Mr Maxine Glenn Neck Wo Contrast  Result Date: 07/07/2016 CLINICAL DATA:  Confusion, blurry vision and dizziness beginning this morning. History of intra cranial hemorrhage 2 months ago, hypertension, hyperlipidemia, dementia. EXAM: MRI HEAD WITHOUT CONTRAST MRA HEAD WITHOUT CONTRAST MRA NECK WITHOUT CONTRAST TECHNIQUE: Multiplanar, multiecho pulse sequences of the brain and surrounding structures were obtained without intravenous contrast. Angiographic images of the Circle of Willis were obtained using MRA technique without intravenous contrast. Angiographic images of the neck were obtained using MRA technique without intravenous contrast. Carotid stenosis measurements (when applicable) are obtained utilizing NASCET criteria, using the distal internal carotid diameter as the denominator. COMPARISON:  CT HEAD Jul 07, 2016 at 1607 hours in CT angiogram of the head and neck June 06, 2016 FINDINGS: MRI HEAD FINDINGS BRAIN: Subcentimeter focus of reduced diffusion RIGHT occipital lobe with T2 shine through. LEFT frontal periventricular 6 mm lesion with low ADC values. Reduced diffusion, susceptibility artifact, it intermediate T1 and bright FLAIR signal LEFT occipital lobe measuring 2.2 x 1.5 cm consistent with subacute to chronic hemorrhage. A few punctate chronic micro  hemorrhages. 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 Moderate to severe ventricular on the basis of global parenchymal brain volume loss.  A few scattered punctate supratentorial white matter FLAIR T2 hyperintensities are less than expected for age. No midline shift or mass effect. No abnormal extra-axial fluid collections. VASCULAR: Normal major intracranial vascular flow voids present at skull base. SKULL AND UPPER CERVICAL SPINE: No abnormal sellar expansion. No suspicious calvarial bone marrow signal. Craniocervical junction maintained. SINUSES/ORBITS: The trace paranasal sinus mucosal thickening. Mastoid air cells are well aerated. The included ocular globes and orbital contents are non-suspicious. Status post bilateral ocular lens implants. OTHER: None. MRA HEAD FINDINGS- due to porous signal to noise ratio, poor characterization of circle of Willis and distal vessels. ANTERIOR CIRCULATION: Decreased flow related enhancement LEFT internal carotid artery, normal in caliber and, normal through the cavernous segment. Normal flow related enhancement RIGHT internal carotid artery. Bilateral A2 segments arise from RIGHT A1-2 junction. Bilateral anterior middle cerebral arteries are patent. POSTERIOR CIRCULATION: Codominant. Basilar artery is patent, with normal flow related enhancement of the main branch vessels. Normal flow related enhancement of the posterior cerebral arteries. ANATOMIC VARIANTS: Aplastic LEFT A1 segment. MRA NECK FINDINGS- limited time-of-flight technique, general poor signal to noise ratio. ANTERIOR CIRCULATION: The common carotid arteries are widely patent bilaterally. The carotid bifurcations are patent bilaterally without hemodynamically significant stenosis by NASCET criteria. No flow limiting stenosis or luminal irregularity. POSTERIOR CIRCULATION: Bilateral vertebral arteries are patent to the vertebrobasilar junction. No flow limiting stenosis or luminal irregularity. Source images and MIP  images were reviewed. IMPRESSION: MRI HEAD: Acute 6 mm LEFT frontal periventricular infarct. Subacute to old RIGHT frontal lobe infarct corresponding to CT abnormality. Subacute LEFT occipital intraparenchymal 2.2 x 1.5 cm hematoma. Moderate to severe parenchymal brain volume loss. MRA HEAD: Limited assessment due to poor signal to noise ratio without emergent large vessel occlusion. MRA NECK: Limited assessment due to poor signal to noise ratio without significant stenosis. Electronically Signed   By: Awilda Metro M.D.   On: 07/07/2016 19:35   Mr Brain Wo Contrast  Result Date: 07/07/2016 CLINICAL DATA:  Confusion, blurry vision and dizziness beginning this morning. History of intra cranial hemorrhage 2 months ago, hypertension, hyperlipidemia, dementia. EXAM: MRI HEAD WITHOUT CONTRAST MRA HEAD WITHOUT CONTRAST MRA NECK WITHOUT CONTRAST TECHNIQUE: Multiplanar, multiecho pulse sequences of the brain and surrounding structures were obtained without intravenous contrast. Angiographic images of the Circle of Willis were obtained using MRA technique without intravenous contrast. Angiographic images of the neck were obtained using MRA technique without intravenous contrast. Carotid stenosis measurements (when applicable) are obtained utilizing NASCET criteria, using the distal internal carotid diameter as the denominator. COMPARISON:  CT HEAD Jul 07, 2016 at 1607 hours in CT angiogram of the head and neck June 06, 2016 FINDINGS: MRI HEAD FINDINGS BRAIN: Subcentimeter focus of reduced diffusion RIGHT occipital lobe with T2 shine through. LEFT frontal periventricular 6 mm lesion with low ADC values. Reduced diffusion, susceptibility artifact, it intermediate T1 and bright FLAIR signal LEFT occipital lobe measuring 2.2 x 1.5 cm consistent with subacute to chronic hemorrhage. A few punctate chronic micro hemorrhages. 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 Moderate to severe ventricular on the basis of global  parenchymal brain volume loss. A few scattered punctate supratentorial white matter FLAIR T2 hyperintensities are less than expected for age. No midline shift or mass effect. No abnormal extra-axial fluid collections. VASCULAR: Normal major intracranial vascular flow voids present at skull base. SKULL AND UPPER CERVICAL SPINE: No abnormal sellar expansion. No suspicious calvarial bone marrow signal. Craniocervical junction maintained. SINUSES/ORBITS: The trace paranasal sinus mucosal thickening. Mastoid air cells  are well aerated. The included ocular globes and orbital contents are non-suspicious. Status post bilateral ocular lens implants. OTHER: None. MRA HEAD FINDINGS- due to porous signal to noise ratio, poor characterization of circle of Willis and distal vessels. ANTERIOR CIRCULATION: Decreased flow related enhancement LEFT internal carotid artery, normal in caliber and, normal through the cavernous segment. Normal flow related enhancement RIGHT internal carotid artery. Bilateral A2 segments arise from RIGHT A1-2 junction. Bilateral anterior middle cerebral arteries are patent. POSTERIOR CIRCULATION: Codominant. Basilar artery is patent, with normal flow related enhancement of the main branch vessels. Normal flow related enhancement of the posterior cerebral arteries. ANATOMIC VARIANTS: Aplastic LEFT A1 segment. MRA NECK FINDINGS- limited time-of-flight technique, general poor signal to noise ratio. ANTERIOR CIRCULATION: The common carotid arteries are widely patent bilaterally. The carotid bifurcations are patent bilaterally without hemodynamically significant stenosis by NASCET criteria. No flow limiting stenosis or luminal irregularity. POSTERIOR CIRCULATION: Bilateral vertebral arteries are patent to the vertebrobasilar junction. No flow limiting stenosis or luminal irregularity. Source images and MIP images were reviewed. IMPRESSION: MRI HEAD: Acute 6 mm LEFT frontal periventricular infarct. Subacute to  old RIGHT frontal lobe infarct corresponding to CT abnormality. Subacute LEFT occipital intraparenchymal 2.2 x 1.5 cm hematoma. Moderate to severe parenchymal brain volume loss. MRA HEAD: Limited assessment due to poor signal to noise ratio without emergent large vessel occlusion. MRA NECK: Limited assessment due to poor signal to noise ratio without significant stenosis. Electronically Signed   By: Awilda Metroourtnay  Bloomer M.D.   On: 07/07/2016 19:35   Ct Head Code Stroke W/o Cm  Result Date: 07/07/2016 CLINICAL DATA:  Code stroke. Increased confusion, nausea, blurred vision, and dizziness beginning at 11 a.m. today, 5 hours ago. Initial encounter. Intracranial hemorrhage 2 months ago. EXAM: CT HEAD WITHOUT CONTRAST TECHNIQUE: Contiguous axial images were obtained from the base of the skull through the vertex without intravenous contrast. COMPARISON:  CTA head and neck 06/06/2016. FINDINGS: Brain: Encephalomalacia is noted within the left occipital lobe in the area of previous hemorrhage. This is expected evolution. A new lucency is present in the right corona radiata anterior to the precentral sulcus. There is no cortical involvement. Mild generalized atrophy is otherwise stable. Brainstem is within normal limits. The cerebellum is stable. No acute hemorrhage or mass lesion is present. No significant extra-axial fluid collection is present. Vascular: The vessels are somewhat hyperdense, but symmetric. Atherosclerotic calcifications are present in the cavernous internal carotid artery's as before. Skull: The calvarium is intact. No focal lytic or blastic lesions are present. Sinuses/Orbits: The paranasal sinuses and mastoid air cells are clear. Bilateral lens replacements are present. The globes and orbits are within normal limits. ASPECTS (Alberta Stroke Program Early CT Score) - Ganglionic level infarction (caudate, lentiform nuclei, internal capsule, insula, M1-M3 cortex): 7/7 - Supraganglionic infarction (M4-M6  cortex): 3/3 Total score (0-10 with 10 being normal): 10/10 IMPRESSION: 1. New 7 mm white matter hypodensity in the anterior right frontal lobe, anterior to the precentral gyrus. This may represent an acute/subacute white matter infarct. 2. No acute cortical lesion. 3. Expected evolution of encephalomalacia involving the left occipital lobe, the site of prior hemorrhage. 4. ASPECTS is 10/10 These results were called by telephone at the time of interpretation on 07/07/2016 at 4:20 pm to Dr. Minna AntisKEVIN PADUCHOWSKI , who verbally acknowledged these results. Electronically Signed   By: Marin Robertshristopher  Mattern M.D.   On: 07/07/2016 16:22   ASSESSMENT AND PLAN:  Latanya MaudlinJames Manzer is a 83 y.o. with PMH HLD, HTN, renal  artery stent, DM Type 2, GERD, late onset alzheimer's disease, SSS, and atrial fibrillation on Eliquis came with dizziness and not feeling well. 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 -pt already on po eliquis -MRA head and neck no stenosis -Neurology consulted -pt feels better. 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 Venous blood pooling in left posterior temporoparietal lobe consistent with cavernous venous malformation. 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 Diminutive left A1 segment of ACA.  - No intervention was indicated by neurosurgery    Case discussed with Care Management/Social Worker. Management plans discussed with the patient, family and they are in  agreement.  CODE STATUS: full  DVT Prophylaxis: eliquis TOTAL TIME TAKING CARE OF THIS PATIENT: 30 minutes.  >50% time spent on counselling and coordination of care  POSSIBLE D/C IN *1-2* DAYS, DEPENDING ON CLINICAL CONDITION.  Note: This dictation was prepared with Dragon dictation along with smaller phrase technology. Any transcriptional errors that result from this process are unintentional.  Avyaan Summer M.D on 07/08/2016 at 8:26 AM  Between 7am to 6pm - Pager - [phone]  After 6pm go to [email or web address] - Social research officer, government  [facility]  Office  [phone]  CC: Primary care physician; Marguarite Arbour, MD

## 2016-07-08 NOTE — Plan of Care (Signed)
Problem: Safety: Goal: Ability to remain free from injury will improve Outcome: Progressing Pt high fall risk. Bed alarm activated. Pt verbalized understanding of use of call light.   Problem: Bowel/Gastric: Goal: Will not experience complications related to bowel motility Outcome: Progressing Pt reports last BM today but reports loose stools but states this is not new and that his stool is usually loose. Collection hat placed in toilet to collect sample if needed.

## 2016-07-08 NOTE — Consult Note (Signed)
Referring Physician: Allena Katz    Chief Complaint: Dizziness  HPI: William Lozano. is an 83 y.o. male with a history of atrial fibrillation and ICH who had acute onset of dizziness and blurred vision on yesterday.  Also had some nausea and vomiting..  Symptoms have resolved.  Initial NIHSS of 0.  Date last known well: Date: 07/07/2016 Time last known well: Time: 11:00 tPA Given: No: Symptoms improved  Past Medical History:  Diagnosis Date  . Arthritis   . Atrial fibrillation (HCC) 2016   paroxysmal atrial fibrillation, sick sinus syndrome  . Complication of anesthesia    affects memory for a longer time after  . Dementia    alzheimers  . Essential hypertension   . GERD (gastroesophageal reflux disease)   . History of kidney stones   . Hyperlipemia   . Inguinal hernia recurrent bilateral   . Kidney stones     Past Surgical History:  Procedure Laterality Date  . CYSTOSCOPY W/ RETROGRADES Bilateral 03/27/2016   Procedure: CYSTOSCOPY WITH RETROGRADE PYELOGRAM;  Surgeon: Vanna Scotland, MD;  Location: [facility];  Service: Urology;  Laterality: Bilateral;  . CYSTOSCOPY W/ URETERAL STENT PLACEMENT Bilateral 02/24/2015   Procedure: CYSTOSCOPY WITH STENT REPLACEMENT;  Surgeon: Hildred Laser, MD;  Location: [facility];  Service: Urology;  Laterality: Bilateral;  . CYSTOSCOPY W/ URETERAL STENT PLACEMENT Left 04/11/2016   Procedure: CYSTOSCOPY WITH STENT REPLACEMENT;  Surgeon: Vanna Scotland, MD;  Location: [facility];  Service: Urology;  Laterality: Left;  . CYSTOSCOPY W/ URETERAL STENT REMOVAL Right 04/11/2016   Procedure: CYSTOSCOPY WITH STENT REMOVAL;  Surgeon: Vanna Scotland, MD;  Location: [facility];  Service: Urology;  Laterality: Right;  . CYSTOSCOPY WITH RETROGRADE PYELOGRAM, URETEROSCOPY AND STENT PLACEMENT Bilateral 12/10/2014   Procedure: CYSTOSCOPY WITH RETROGRADE PYELOGRAM, URETEROSCOPY AND STENT PLACEMENT;  Surgeon: Hildred Laser, MD;  Location: [facility];  Service: Urology;   Laterality: Bilateral;  . CYSTOSCOPY WITH STENT PLACEMENT Bilateral 12/10/2014   Procedure: CYSTOSCOPY WITH STENT PLACEMENT;  Surgeon: Hildred Laser, MD;  Location: [facility];  Service: Urology;  Laterality: Bilateral;  . CYSTOSCOPY WITH STENT PLACEMENT Bilateral 03/27/2016   Procedure: CYSTOSCOPY WITH STENT PLACEMENT;  Surgeon: Vanna Scotland, MD;  Location: [facility];  Service: Urology;  Laterality: Bilateral;  . EXTRACORPOREAL SHOCK WAVE LITHOTRIPSY Right 01/07/2015   Procedure: EXTRACORPOREAL SHOCK WAVE LITHOTRIPSY (ESWL);  Surgeon: Lorraine Lax, MD;  Location: [facility];  Service: Urology;  Laterality: Right;  . EXTRACORPOREAL SHOCK WAVE LITHOTRIPSY Left 01/21/2015   Procedure: EXTRACORPOREAL SHOCK WAVE LITHOTRIPSY (ESWL);  Surgeon: Hildred Laser, MD;  Location: [facility];  Service: Urology;  Laterality: Left;  . EYE SURGERY Bilateral    Cataract Extraction with IOL  . HERNIA REPAIR Bilateral    inguinal  . URETEROSCOPY WITH HOLMIUM LASER LITHOTRIPSY Bilateral 02/24/2015   Procedure: URETEROSCOPY WITH HOLMIUM LASER LITHOTRIPSY /STONE BASKETING;  Surgeon: Hildred Laser, MD;  Location: [facility];  Service: Urology;  Laterality: Bilateral;  . URETEROSCOPY WITH HOLMIUM LASER LITHOTRIPSY Bilateral 04/11/2016   Procedure: URETEROSCOPY WITH HOLMIUM LASER LITHOTRIPSY;  Surgeon: Vanna Scotland, MD;  Location: [facility];  Service: Urology;  Laterality: Bilateral;    Family History  Problem Relation Age of Onset  . Heart attack Mother   . Heart attack Father   . Hypertension Unknown   . Kidney disease Neg Hx   . Prostate cancer Neg Hx    Social History:  reports that he has never smoked. He has never used smokeless tobacco.  He reports that he does not drink alcohol or use drugs.  Allergies:  Allergies  Allergen Reactions  . Codeine Other (See Comments)    Causes side effects  . Penicillin G Rash  . Sulfa Antibiotics Rash    Medications:  I have reviewed the patient's current  medications. Prior to Admission:  Prescriptions Prior to Admission  Medication Sig Dispense Refill Last Dose  . apixaban (ELIQUIS) 2.5 MG TABS tablet Take 2.5 mg by mouth 2 (two) times daily.    07/07/2016 at 0800  . hydrochlorothiazide (HYDRODIURIL) 25 MG tablet Take 25 mg by mouth daily.  1 07/07/2016 at 0800  . metoprolol succinate (TOPROL-XL) 25 MG 24 hr tablet Take 25 mg by mouth daily.   07/07/2016 at 0800  . ondansetron (ZOFRAN ODT) 4 MG disintegrating tablet Take 1 tablet (4 mg total) by mouth every 4 (four) hours as needed for nausea or vomiting. 30 tablet 0 PRN at PRN  . pantoprazole (PROTONIX) 40 MG tablet Take 40 mg by mouth daily.   07/07/2016 at 0800  . rivastigmine (EXELON) 3 MG capsule Take 3 mg by mouth 2 (two) times daily.    07/07/2016 at 0800  . simvastatin (ZOCOR) 20 MG tablet Take 20 mg by mouth daily.   07/06/2016 at 2000  . ertapenem 1 g in sodium chloride 0.9 % 50 mL Inject 1 g into the vein daily. 10 g 0 Taking  . traMADol (ULTRAM) 50 MG tablet Take 1 tablet (50 mg total) by mouth every 6 (six) hours as needed for moderate pain. (Patient not taking: Reported on 07/07/2016) 30 tablet 0 Completed Course at Unknown time   Scheduled: . apixaban  2.5 mg Oral BID  . hydrochlorothiazide  25 mg Oral Daily  . metoprolol succinate  25 mg Oral Daily  . pantoprazole  40 mg Oral Daily  . ramelteon  8 mg Oral QHS  . rivastigmine  3 mg Oral BID  . simvastatin  20 mg Oral Daily    ROS: History obtained from the patient  General ROS: negative for - chills, fatigue, fever, night sweats, weight gain or weight loss Psychological ROS: memory difficulties Ophthalmic ROS: negative for - blurry vision, double vision, eye pain or loss of vision ENT ROS: negative for - epistaxis, nasal discharge, oral lesions, sore throat, tinnitus or vertigo Allergy and Immunology ROS: negative for - hives or itchy/watery eyes Hematological and Lymphatic ROS: negative for - bleeding problems, bruising or  swollen lymph nodes Endocrine ROS: negative for - galactorrhea, hair pattern changes, polydipsia/polyuria or temperature intolerance Respiratory ROS: negative for - cough, hemoptysis, shortness of breath or wheezing Cardiovascular ROS: negative for - chest pain, dyspnea on exertion, edema or irregular heartbeat Gastrointestinal ROS: negative for - abdominal pain, diarrhea, hematemesis, nausea/vomiting or stool incontinence Genito-Urinary ROS: negative for - dysuria, hematuria, incontinence or urinary frequency/urgency Musculoskeletal ROS: right hip pain Neurological ROS: as noted in HPI Dermatological ROS: negative for rash and skin lesion changes  Physical Examination: Blood pressure (!) 120/59, pulse 71, temperature 97 F (36.1 C), temperature source Oral, resp. rate 18, height 5\' 9"  (1.753 m), weight 73 kg (160 lb 14.4 oz), SpO2 97 %.  Gen: NAD HEENT-  Normocephalic, no lesions, without obvious abnormality.  Normal external eye and conjunctiva.  Normal TM's bilaterally.  Normal auditory canals and external ears. Normal external nose, mucus membranes and septum.  Normal pharynx. Cardiovascular- S1, S2 normal, pulses palpable throughout   Lungs- chest clear, no wheezing, rales, normal symmetric air  entry Abdomen- soft, non-tender; bowel sounds normal; no masses,  no organomegaly Extremities- no edema Lymph-no adenopathy palpable Musculoskeletal-no joint tenderness, deformity or swelling Skin-warm and dry, no hyperpigmentation, vitiligo, or suspicious lesions  Neurological Examination   Mental Status: Alert, perseverative, poor memory.  Speech fluent without evidence of aphasia.  Able to follow 3 step commands without difficulty. Cranial Nerves: II: Discs flat bilaterally; Visual fields grossly normal, pupils equal, round, reactive to light and accommodation III,IV, VI: ptosis not present, extra-ocular motions intact bilaterally V,VII: smile symmetric, facial light touch sensation  normal bilaterally VIII: hearing normal bilaterally IX,X: gag reflex present XI: bilateral shoulder shrug XII: midline tongue extension Motor: Right : Upper extremity   5/5    Left:     Upper extremity   5/5  Lower extremity   5/5     Lower extremity   5/5 Tone and bulk:normal tone throughout; no atrophy noted Sensory: Pinprick and light touch intact throughout, bilaterally Deep Tendon Reflexes: 2+ in the upper extremities and absent in the lower extremities Plantars: Right: mute   Left: mute Cerebellar: Normal finger-to-nose and normal heel-to-shin testing bilaterally Gait: not tested due to safety concerns   Laboratory Studies:  Basic Metabolic Panel:  Recent Labs Lab 07/07/16 1557  NA 137  K 3.3*  CL 103  CO2 26  GLUCOSE 209*  BUN 23*  CREATININE 1.31*  CALCIUM 9.6    Liver Function Tests:  Recent Labs Lab 07/07/16 1557  AST 29  ALT 16*  ALKPHOS 75  BILITOT 0.9  PROT 7.1  ALBUMIN 4.3   No results for input(s): LIPASE, AMYLASE in the last 168 hours. No results for input(s): AMMONIA in the last 168 hours.  CBC:  Recent Labs Lab 07/07/16 1557  WBC 14.0*  NEUTROABS 12.2*  HGB 16.7  HCT 49.6  MCV 90.0  PLT 284    Cardiac Enzymes:  Recent Labs Lab 07/07/16 1557  TROPONINI <0.03    BNP: Invalid input(s): POCBNP  CBG:  Recent Labs Lab 07/07/16 1630 07/07/16 1954 07/08/16 0738 07/08/16 1135  GLUCAP 196* 115* 123* 112*    Microbiology: Results for orders placed or performed in visit on 04/18/16  Microscopic Examination     Status: Abnormal   Collection Time: 04/18/16  3:25 PM  Result Value Ref Range Status   WBC, UA 6-10 (A) 0 - 5 /hpf Final   RBC, UA >30 (A) 0 - 2 /hpf Final   Epithelial Cells (non renal) >10 (A) 0 - 10 /hpf Final   Crystals Present (A) N/A Final   Crystal Type Amorphous Sediment N/A Final   Bacteria, UA None seen None seen/Few Final    Coagulation Studies:  Recent Labs  07/07/16 1557  LABPROT 14.6  INR  1.13    Urinalysis: No results for input(s): COLORURINE, LABSPEC, PHURINE, GLUCOSEU, HGBUR, BILIRUBINUR, KETONESUR, PROTEINUR, UROBILINOGEN, NITRITE, LEUKOCYTESUR in the last 168 hours.  Invalid input(s): APPERANCEUR  Lipid Panel:    Component Value Date/Time   CHOL 147 07/08/2016 0425   TRIG 126 07/08/2016 0425   HDL 26 (L) 07/08/2016 0425   CHOLHDL 5.7 07/08/2016 0425   VLDL 25 07/08/2016 0425   LDLCALC 96 07/08/2016 0425    HgbA1C:  Lab Results  Component Value Date   HGBA1C 6.9 (H) 04/13/2016    Urine Drug Screen:  No results found for: LABOPIA, COCAINSCRNUR, LABBENZ, AMPHETMU, THCU, LABBARB  Alcohol Level: No results for input(s): ETH in the last 168 hours.  Other results: EKG: atrial fibrillation,  rate 83 bpm.  Imaging: Mr Angiogram Head Wo Contrast  Result Date: 07/07/2016 CLINICAL DATA:  Confusion, blurry vision and dizziness beginning this morning. History of intra cranial hemorrhage 2 months ago, hypertension, hyperlipidemia, dementia. EXAM: MRI HEAD WITHOUT CONTRAST MRA HEAD WITHOUT CONTRAST MRA NECK WITHOUT CONTRAST TECHNIQUE: Multiplanar, multiecho pulse sequences of the brain and surrounding structures were obtained without intravenous contrast. Angiographic images of the Circle of Willis were obtained using MRA technique without intravenous contrast. Angiographic images of the neck were obtained using MRA technique without intravenous contrast. Carotid stenosis measurements (when applicable) are obtained utilizing NASCET criteria, using the distal internal carotid diameter as the denominator. COMPARISON:  CT HEAD Jul 07, 2016 at 1607 hours in CT angiogram of the head and neck June 06, 2016 FINDINGS: MRI HEAD FINDINGS BRAIN: Subcentimeter focus of reduced diffusion RIGHT occipital lobe with T2 shine through. LEFT frontal periventricular 6 mm lesion with low ADC values. Reduced diffusion, susceptibility artifact, it intermediate T1 and bright FLAIR signal LEFT occipital  lobe measuring 2.2 x 1.5 cm consistent with subacute to chronic hemorrhage. A few punctate chronic micro hemorrhages. 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 Moderate to severe ventricular on the basis of global parenchymal brain volume loss. A few scattered punctate supratentorial white matter FLAIR T2 hyperintensities are less than expected for age. No midline shift or mass effect. No abnormal extra-axial fluid collections. VASCULAR: Normal major intracranial vascular flow voids present at skull base. SKULL AND UPPER CERVICAL SPINE: No abnormal sellar expansion. No suspicious calvarial bone marrow signal. Craniocervical junction maintained. SINUSES/ORBITS: The trace paranasal sinus mucosal thickening. Mastoid air cells are well aerated. The included ocular globes and orbital contents are non-suspicious. Status post bilateral ocular lens implants. OTHER: None. MRA HEAD FINDINGS- due to porous signal to noise ratio, poor characterization of circle of Willis and distal vessels. ANTERIOR CIRCULATION: Decreased flow related enhancement LEFT internal carotid artery, normal in caliber and, normal through the cavernous segment. Normal flow related enhancement RIGHT internal carotid artery. Bilateral A2 segments arise from RIGHT A1-2 junction. Bilateral anterior middle cerebral arteries are patent. POSTERIOR CIRCULATION: Codominant. Basilar artery is patent, with normal flow related enhancement of the main branch vessels. Normal flow related enhancement of the posterior cerebral arteries. ANATOMIC VARIANTS: Aplastic LEFT A1 segment. MRA NECK FINDINGS- limited time-of-flight technique, general poor signal to noise ratio. ANTERIOR CIRCULATION: The common carotid arteries are widely patent bilaterally. The carotid bifurcations are patent bilaterally without hemodynamically significant stenosis by NASCET criteria. No flow limiting stenosis or luminal irregularity. POSTERIOR CIRCULATION: Bilateral vertebral arteries are patent  to the vertebrobasilar junction. No flow limiting stenosis or luminal irregularity. Source images and MIP images were reviewed. IMPRESSION: MRI HEAD: Acute 6 mm LEFT frontal periventricular infarct. Subacute to old RIGHT frontal lobe infarct corresponding to CT abnormality. Subacute LEFT occipital intraparenchymal 2.2 x 1.5 cm hematoma. Moderate to severe parenchymal brain volume loss. MRA HEAD: Limited assessment due to poor signal to noise ratio without emergent large vessel occlusion. MRA NECK: Limited assessment due to poor signal to noise ratio without significant stenosis. Electronically Signed   By: Awilda Metro M.D.   On: 07/07/2016 19:35   Mr Maxine Glenn Neck Wo Contrast  Result Date: 07/07/2016 CLINICAL DATA:  Confusion, blurry vision and dizziness beginning this morning. History of intra cranial hemorrhage 2 months ago, hypertension, hyperlipidemia, dementia. EXAM: MRI HEAD WITHOUT CONTRAST MRA HEAD WITHOUT CONTRAST MRA NECK WITHOUT CONTRAST TECHNIQUE: Multiplanar, multiecho pulse sequences of the brain and surrounding structures were obtained without intravenous contrast.  Angiographic images of the Circle of Willis were obtained using MRA technique without intravenous contrast. Angiographic images of the neck were obtained using MRA technique without intravenous contrast. Carotid stenosis measurements (when applicable) are obtained utilizing NASCET criteria, using the distal internal carotid diameter as the denominator. COMPARISON:  CT HEAD Jul 07, 2016 at 1607 hours in CT angiogram of the head and neck June 06, 2016 FINDINGS: MRI HEAD FINDINGS BRAIN: Subcentimeter focus of reduced diffusion RIGHT occipital lobe with T2 shine through. LEFT frontal periventricular 6 mm lesion with low ADC values. Reduced diffusion, susceptibility artifact, it intermediate T1 and bright FLAIR signal LEFT occipital lobe measuring 2.2 x 1.5 cm consistent with subacute to chronic hemorrhage. A few punctate chronic micro  hemorrhages. 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 Moderate to severe ventricular on the basis of global parenchymal brain volume loss. A few scattered punctate supratentorial white matter FLAIR T2 hyperintensities are less than expected for age. No midline shift or mass effect. No abnormal extra-axial fluid collections. VASCULAR: Normal major intracranial vascular flow voids present at skull base. SKULL AND UPPER CERVICAL SPINE: No abnormal sellar expansion. No suspicious calvarial bone marrow signal. Craniocervical junction maintained. SINUSES/ORBITS: The trace paranasal sinus mucosal thickening. Mastoid air cells are well aerated. The included ocular globes and orbital contents are non-suspicious. Status post bilateral ocular lens implants. OTHER: None. MRA HEAD FINDINGS- due to porous signal to noise ratio, poor characterization of circle of Willis and distal vessels. ANTERIOR CIRCULATION: Decreased flow related enhancement LEFT internal carotid artery, normal in caliber and, normal through the cavernous segment. Normal flow related enhancement RIGHT internal carotid artery. Bilateral A2 segments arise from RIGHT A1-2 junction. Bilateral anterior middle cerebral arteries are patent. POSTERIOR CIRCULATION: Codominant. Basilar artery is patent, with normal flow related enhancement of the main branch vessels. Normal flow related enhancement of the posterior cerebral arteries. ANATOMIC VARIANTS: Aplastic LEFT A1 segment. MRA NECK FINDINGS- limited time-of-flight technique, general poor signal to noise ratio. ANTERIOR CIRCULATION: The common carotid arteries are widely patent bilaterally. The carotid bifurcations are patent bilaterally without hemodynamically significant stenosis by NASCET criteria. No flow limiting stenosis or luminal irregularity. POSTERIOR CIRCULATION: Bilateral vertebral arteries are patent to the vertebrobasilar junction. No flow limiting stenosis or luminal irregularity. Source images and MIP  images were reviewed. IMPRESSION: MRI HEAD: Acute 6 mm LEFT frontal periventricular infarct. Subacute to old RIGHT frontal lobe infarct corresponding to CT abnormality. Subacute LEFT occipital intraparenchymal 2.2 x 1.5 cm hematoma. Moderate to severe parenchymal brain volume loss. MRA HEAD: Limited assessment due to poor signal to noise ratio without emergent large vessel occlusion. MRA NECK: Limited assessment due to poor signal to noise ratio without significant stenosis. Electronically Signed   By: Awilda Metroourtnay  Bloomer M.D.   On: 07/07/2016 19:35   Mr Brain Wo Contrast  Result Date: 07/07/2016 CLINICAL DATA:  Confusion, blurry vision and dizziness beginning this morning. History of intra cranial hemorrhage 2 months ago, hypertension, hyperlipidemia, dementia. EXAM: MRI HEAD WITHOUT CONTRAST MRA HEAD WITHOUT CONTRAST MRA NECK WITHOUT CONTRAST TECHNIQUE: Multiplanar, multiecho pulse sequences of the brain and surrounding structures were obtained without intravenous contrast. Angiographic images of the Circle of Willis were obtained using MRA technique without intravenous contrast. Angiographic images of the neck were obtained using MRA technique without intravenous contrast. Carotid stenosis measurements (when applicable) are obtained utilizing NASCET criteria, using the distal internal carotid diameter as the denominator. COMPARISON:  CT HEAD Jul 07, 2016 at 1607 hours in CT angiogram of the head and neck June 06, 2016 FINDINGS: MRI HEAD FINDINGS BRAIN: Subcentimeter focus of reduced diffusion RIGHT occipital lobe with T2 shine through. LEFT frontal periventricular 6 mm lesion with low ADC values. Reduced diffusion, susceptibility artifact, it intermediate T1 and bright FLAIR signal LEFT occipital lobe measuring 2.2 x 1.5 cm consistent with subacute to chronic hemorrhage. A few punctate chronic micro hemorrhages. 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 Moderate to severe ventricular on the basis of global  parenchymal brain volume loss. A few scattered punctate supratentorial white matter FLAIR T2 hyperintensities are less than expected for age. No midline shift or mass effect. No abnormal extra-axial fluid collections. VASCULAR: Normal major intracranial vascular flow voids present at skull base. SKULL AND UPPER CERVICAL SPINE: No abnormal sellar expansion. No suspicious calvarial bone marrow signal. Craniocervical junction maintained. SINUSES/ORBITS: The trace paranasal sinus mucosal thickening. Mastoid air cells are well aerated. The included ocular globes and orbital contents are non-suspicious. Status post bilateral ocular lens implants. OTHER: None. MRA HEAD FINDINGS- due to porous signal to noise ratio, poor characterization of circle of Willis and distal vessels. ANTERIOR CIRCULATION: Decreased flow related enhancement LEFT internal carotid artery, normal in caliber and, normal through the cavernous segment. Normal flow related enhancement RIGHT internal carotid artery. Bilateral A2 segments arise from RIGHT A1-2 junction. Bilateral anterior middle cerebral arteries are patent. POSTERIOR CIRCULATION: Codominant. Basilar artery is patent, with normal flow related enhancement of the main branch vessels. Normal flow related enhancement of the posterior cerebral arteries. ANATOMIC VARIANTS: Aplastic LEFT A1 segment. MRA NECK FINDINGS- limited time-of-flight technique, general poor signal to noise ratio. ANTERIOR CIRCULATION: The common carotid arteries are widely patent bilaterally. The carotid bifurcations are patent bilaterally without hemodynamically significant stenosis by NASCET criteria. No flow limiting stenosis or luminal irregularity. POSTERIOR CIRCULATION: Bilateral vertebral arteries are patent to the vertebrobasilar junction. No flow limiting stenosis or luminal irregularity. Source images and MIP images were reviewed. IMPRESSION: MRI HEAD: Acute 6 mm LEFT frontal periventricular infarct. Subacute to  old RIGHT frontal lobe infarct corresponding to CT abnormality. Subacute LEFT occipital intraparenchymal 2.2 x 1.5 cm hematoma. Moderate to severe parenchymal brain volume loss. MRA HEAD: Limited assessment due to poor signal to noise ratio without emergent large vessel occlusion. MRA NECK: Limited assessment due to poor signal to noise ratio without significant stenosis. Electronically Signed   By: Awilda Metro M.D.   On: 07/07/2016 19:35   US Carotid Bilateral (at [facility] And [facility] Only)  Result Date: 07/08/2016 CLINICAL DATA:  Hypertension. Syncope. Visual disturbance. Hyperlipidemia. EXAM: BILATERAL CAROTID DUPLEX ULTRASOUND TECHNIQUE: Wallace Cullens scale imaging, color Doppler and duplex ultrasound were performed of bilateral carotid and vertebral arteries in the neck. COMPARISON:  None. FINDINGS: Criteria: Quantification of carotid stenosis is based on velocity parameters that correlate the residual internal carotid diameter with NASCET-based stenosis levels, using the diameter of the distal internal carotid lumen as the denominator for stenosis measurement. The following velocity measurements were obtained: RIGHT ICA:  65 cm/sec CCA:  63 cm/sec SYSTOLIC ICA/CCA RATIO:  1.0 DIASTOLIC ICA/CCA RATIO:  1.6 ECA:  58 cm/sec LEFT ICA:  55 cm/sec CCA:  87 cm/sec SYSTOLIC ICA/CCA RATIO:  0.6 DIASTOLIC ICA/CCA RATIO:  1.0 ECA:  56 cm/sec RIGHT CAROTID ARTERY: Little if any plaque in the bulb. Low resistance internal carotid Doppler pattern. Irregular cardiac rhythm is noted. RIGHT VERTEBRAL ARTERY:  Antegrade. LEFT CAROTID ARTERY: Mild irregular plaque in the bulb. Low resistance internal carotid Doppler pattern is preserved. LEFT VERTEBRAL ARTERY:  Antegrade. IMPRESSION: Less than 50% stenosis in  the right and left internal carotid arteries. Electronically Signed   By: Jolaine Click M.D.   On: 07/08/2016 13:57   Ct Head Code Stroke W/o Cm  Result Date: 07/07/2016 CLINICAL DATA:  Code stroke. Increased confusion, nausea,  blurred vision, and dizziness beginning at 11 a.m. today, 5 hours ago. Initial encounter. Intracranial hemorrhage 2 months ago. EXAM: CT HEAD WITHOUT CONTRAST TECHNIQUE: Contiguous axial images were obtained from the base of the skull through the vertex without intravenous contrast. COMPARISON:  CTA head and neck 06/06/2016. FINDINGS: Brain: Encephalomalacia is noted within the left occipital lobe in the area of previous hemorrhage. This is expected evolution. A new lucency is present in the right corona radiata anterior to the precentral sulcus. There is no cortical involvement. Mild generalized atrophy is otherwise stable. Brainstem is within normal limits. The cerebellum is stable. No acute hemorrhage or mass lesion is present. No significant extra-axial fluid collection is present. Vascular: The vessels are somewhat hyperdense, but symmetric. Atherosclerotic calcifications are present in the cavernous internal carotid artery's as before. Skull: The calvarium is intact. No focal lytic or blastic lesions are present. Sinuses/Orbits: The paranasal sinuses and mastoid air cells are clear. Bilateral lens replacements are present. The globes and orbits are within normal limits. ASPECTS (Alberta Stroke Program Early CT Score) - Ganglionic level infarction (caudate, lentiform nuclei, internal capsule, insula, M1-M3 cortex): 7/7 - Supraganglionic infarction (M4-M6 cortex): 3/3 Total score (0-10 with 10 being normal): 10/10 IMPRESSION: 1. New 7 mm white matter hypodensity in the anterior right frontal lobe, anterior to the precentral gyrus. This may represent an acute/subacute white matter infarct. 2. No acute cortical lesion. 3. Expected evolution of encephalomalacia involving the left occipital lobe, the site of prior hemorrhage. 4. ASPECTS is 10/10 These results were called by telephone at the time of interpretation on 07/07/2016 at 4:20 pm to Dr. Minna Antis , who verbally acknowledged these results.  Electronically Signed   By: Marin Roberts M.D.   On: 07/07/2016 16:22    Assessment: 83 y.o. male presenting with dizziness and blurred vision.  Symptoms have resolved.  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  Patient on reduced dose of Eliquis due to recent ICH and multiple microhemorrhages on MR.  Patient was not placed on full dose after hemorrhage due to high risk of recurrent ICH.  Carotid dopplers show no evidence of hemodynamically significant stenosis.  A1c pending, LDL 96.  Risks and benefit of increasing Eliquis discussed.  It was determined that patient would stay on current regimen.  Stroke Risk Factors - atrial fibrillation, hyperlipidemia and hypertension  Plan: 1. Aggressive lipid management with target LDL<70 2. Prophylactic therapy-continue Eliquis at current dose 3. Frequent neuro checks 4. Patient to continue outpatient follow up   Thana Farr, MD Neurology [phone] 07/08/2016, 4:15 PM

## 2016-07-09 LAB — GLUCOSE, CAPILLARY: GLUCOSE-CAPILLARY: 130 mg/dL — AB (ref 65–99)

## 2016-07-09 LAB — HEMOGLOBIN A1C
Hgb A1c MFr Bld: 6.9 % — ABNORMAL HIGH (ref 4.8–5.6)
MEAN PLASMA GLUCOSE: 151 mg/dL

## 2016-07-09 NOTE — Discharge Instructions (Signed)
Pt advised to keep appt with [facility] which he has in June

## 2016-07-09 NOTE — Discharge Summary (Signed)
[facility] - [place] at [facility]   PATIENT NAME: William Lozano    MR#:  [id number]  DATE OF BIRTH:  09/17/1932  DATE OF ADMISSION:  07/07/2016 ADMITTING PHYSICIAN: Katha Hamming, MD  DATE OF DISCHARGE: 07/09/2016  PRIMARY CARE PHYSICIAN: Marguarite Arbour, MD    ADMISSION DIAGNOSIS:  Dizziness [R42] Stroke [facility]) [I63.9] Cerebrovascular accident (CVA), unspecified mechanism (HCC) [I63.9]  DISCHARGE DIAGNOSIS:  Acute CVA-LEFT frontal periventricular infarct  SECONDARY DIAGNOSIS:   Past Medical History:  Diagnosis Date  . Arthritis   . Atrial fibrillation (HCC) 2016   paroxysmal atrial fibrillation, sick sinus syndrome  . Complication of anesthesia    affects memory for a longer time after  . Dementia    alzheimers  . Essential hypertension   . GERD (gastroesophageal reflux disease)   . History of kidney stones   . Hyperlipemia   . Inguinal hernia recurrent bilateral   . Kidney stones     HOSPITAL COURSE:  William Lozano is a 83 y.o. with PMH HLD, HTN, renal artery stent, DM Type 2, GERD, late onset alzheimer's disease, SSS, and atrial fibrillation on Eliquis came with dizziness and not feeling well. He has h/o ICH left parieto-occipital ICH (2.8 x 2.4 x 1.8 cm) in march 2018 for which he was at [facility]  # Acute 6 mm LEFT frontal periventricular infarct. -pt already on po eliquis -MRA head and neck no stenosis -Neurology consult appreciated-- no new recommendations cont same meds -pt feels better. 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 Venous blood pooling in left posterior temporoparietal lobe consistent with cavernous venous malformation. 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 Diminutive left A1 segment of ACA.  - No intervention was indicated by neurosurgery   Pt will d/c home and out pt f/u with PCP and neurology at [facility]  CONSULTS OBTAINED:  Treatment Team:  Thana Farr, MD  DRUG ALLERGIES:   Allergies  Allergen Reactions  . Codeine Other (See Comments)    Causes side effects  . Penicillin G Rash  . Sulfa Antibiotics Rash    DISCHARGE MEDICATIONS:   Current Discharge Medication List    CONTINUE these medications which have NOT CHANGED   Details  apixaban (ELIQUIS) 2.5 MG TABS tablet Take 2.5 mg by mouth 2 (two) times daily.     hydrochlorothiazide (HYDRODIURIL) 25 MG tablet Take 25 mg by mouth daily. Refills: 1    metoprolol succinate (TOPROL-XL) 25 MG 24 hr tablet Take 25 mg by mouth daily.    ondansetron (ZOFRAN ODT) 4 MG disintegrating tablet Take 1 tablet (4 mg total) by mouth every 4 (four) hours as needed for nausea or vomiting. Qty: 30 tablet, Refills: 0   Associated Diagnoses: Nausea    pantoprazole (PROTONIX) 40 MG tablet Take 40 mg by mouth daily.    rivastigmine (EXELON) 3 MG capsule Take 3 mg by mouth 2 (two) times daily.     simvastatin (ZOCOR) 20 MG tablet Take 20 mg by mouth daily.    traMADol (ULTRAM) 50 MG tablet Take 1 tablet (  50 mg total) by mouth every 6 (six) hours as needed for moderate pain. Qty: 30 tablet, Refills: 0      STOP taking these medications     ertapenem 1 g in sodium chloride 0.9 % 50 mL         If you experience worsening of your admission symptoms, develop shortness of breath, life threatening emergency, suicidal or homicidal thoughts you must seek medical attention immediately by calling 911 or calling your MD immediately  if symptoms less severe.  You Must read complete instructions/literature along with all the  possible adverse reactions/side effects for all the Medicines you take and that have been prescribed to you. Take any new Medicines after you have completely understood and accept all the possible adverse reactions/side effects.   Please note  You were cared for by a hospitalist during your hospital stay. If you have any questions about your discharge medications or the care you received while you were in the hospital after you are discharged, you can call the unit and asked to speak with the hospitalist on call if the hospitalist that took care of you is not available. Once you are discharged, your primary care physician will handle any further medical issues. Please note that NO REFILLS for any discharge medications will be authorized once you are discharged, as it is imperative that you return to your primary care physician (or establish a relationship with a primary care physician if you do not have one) for your aftercare needs so that they can reassess your need for medications and monitor your lab values. Today   SUBJECTIVE   Doing well  VITAL SIGNS:  Blood pressure (!) 142/90, pulse 82, temperature 97.7 F (36.5 C), temperature source Oral, resp. rate 18, height 5\' 9"  (1.753 m), weight 73 kg (160 lb 14.4 oz), SpO2 96 %.  I/O:   Intake/Output Summary (Last 24 hours) at 07/09/16 1610 Last data filed at 07/08/16 1800  Gross per 24 hour  Intake              240 ml  Output                0 ml  Net              240 ml    PHYSICAL EXAMINATION:  GENERAL:  83 y.o.-year-old patient lying in the bed with no acute distress.  EYES: Pupils equal, round, reactive to light and accommodation. No scleral icterus. Extraocular muscles intact.  HEENT: Head atraumatic, normocephalic. Oropharynx and nasopharynx clear.  NECK:  Supple, no jugular venous distention. No thyroid enlargement, no tenderness.  LUNGS: Normal breath sounds bilaterally, no wheezing, rales,rhonchi or crepitation. No use of  accessory muscles of respiration.  CARDIOVASCULAR: S1, S2 normal. No murmurs, rubs, or gallops.  ABDOMEN: Soft, non-tender, non-distended. Bowel sounds present. No organomegaly or mass.  EXTREMITIES: No pedal edema, cyanosis, or clubbing.  NEUROLOGIC: Cranial nerves II through XII are intact. Muscle strength 5/5 in all extremities. Sensation intact. Gait not checked.  PSYCHIATRIC: The patient is alert and oriented x 3.  SKIN: No obvious rash, lesion, or ulcer.   DATA REVIEW:   CBC   Recent Labs Lab 07/07/16 1557  WBC 14.0*  HGB 16.7  HCT 49.6  PLT 284    Chemistries   Recent Labs Lab 07/07/16 1557  NA 137  K 3.3*  CL 103  CO2 26  GLUCOSE 209*  BUN 23*  CREATININE 1.31*  CALCIUM 9.6  AST  29  ALT 16*  ALKPHOS 75  BILITOT 0.9    Microbiology Results   No results found for this or any previous visit (from the past 240 hour(s)).  RADIOLOGY:  Mr Angiogram Head Wo Contrast  Result Date: 07/07/2016 CLINICAL DATA:  Confusion, blurry vision and dizziness beginning this morning. History of intra cranial hemorrhage 2 months ago, hypertension, hyperlipidemia, dementia. EXAM: MRI HEAD WITHOUT CONTRAST MRA HEAD WITHOUT CONTRAST MRA NECK WITHOUT CONTRAST TECHNIQUE: Multiplanar, multiecho pulse sequences of the brain and surrounding structures were obtained without intravenous contrast. Angiographic images of the Circle of Willis were obtained using MRA technique without intravenous contrast. Angiographic images of the neck were obtained using MRA technique without intravenous contrast. Carotid stenosis measurements (when applicable) are obtained utilizing NASCET criteria, using the distal internal carotid diameter as the denominator. COMPARISON:  CT HEAD Jul 07, 2016 at 1607 hours in CT angiogram of the head and neck June 06, 2016 FINDINGS: MRI HEAD FINDINGS BRAIN: Subcentimeter focus of reduced diffusion RIGHT occipital lobe with T2 shine through. LEFT frontal periventricular 6 mm  lesion with low ADC values. Reduced diffusion, susceptibility artifact, it intermediate T1 and bright FLAIR signal LEFT occipital lobe measuring 2.2 x 1.5 cm consistent with subacute to chronic hemorrhage. A few punctate chronic micro hemorrhages. 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 Moderate to severe ventricular on the basis of global parenchymal brain volume loss. A few scattered punctate supratentorial white matter FLAIR T2 hyperintensities are less than expected for age. No midline shift or mass effect. No abnormal extra-axial fluid collections. VASCULAR: Normal major intracranial vascular flow voids present at skull base. SKULL AND UPPER CERVICAL SPINE: No abnormal sellar expansion. No suspicious calvarial bone marrow signal. Craniocervical junction maintained. SINUSES/ORBITS: The trace paranasal sinus mucosal thickening. Mastoid air cells are well aerated. The included ocular globes and orbital contents are non-suspicious. Status post bilateral ocular lens implants. OTHER: None. MRA HEAD FINDINGS- due to porous signal to noise ratio, poor characterization of circle of Willis and distal vessels. ANTERIOR CIRCULATION: Decreased flow related enhancement LEFT internal carotid artery, normal in caliber and, normal through the cavernous segment. Normal flow related enhancement RIGHT internal carotid artery. Bilateral A2 segments arise from RIGHT A1-2 junction. Bilateral anterior middle cerebral arteries are patent. POSTERIOR CIRCULATION: Codominant. Basilar artery is patent, with normal flow related enhancement of the main branch vessels. Normal flow related enhancement of the posterior cerebral arteries. ANATOMIC VARIANTS: Aplastic LEFT A1 segment. MRA NECK FINDINGS- limited time-of-flight technique, general poor signal to noise ratio. ANTERIOR CIRCULATION: The common carotid arteries are widely patent bilaterally. The carotid bifurcations are patent bilaterally without hemodynamically significant stenosis by  NASCET criteria. No flow limiting stenosis or luminal irregularity. POSTERIOR CIRCULATION: Bilateral vertebral arteries are patent to the vertebrobasilar junction. No flow limiting stenosis or luminal irregularity. Source images and MIP images were reviewed. IMPRESSION: MRI HEAD: Acute 6 mm LEFT frontal periventricular infarct. Subacute to old RIGHT frontal lobe infarct corresponding to CT abnormality. Subacute LEFT occipital intraparenchymal 2.2 x 1.5 cm hematoma. Moderate to severe parenchymal brain volume loss. MRA HEAD: Limited assessment due to poor signal to noise ratio without emergent large vessel occlusion. MRA NECK: Limited assessment due to poor signal to noise ratio without significant stenosis. Electronically Signed   By: Awilda Metro M.D.   On: 07/07/2016 19:35   Mr Maxine Glenn Neck Wo Contrast  Result Date: 07/07/2016 CLINICAL DATA:  Confusion, blurry vision and dizziness beginning this morning. History of intra cranial hemorrhage 2 months ago, hypertension, hyperlipidemia, dementia.  EXAM: MRI HEAD WITHOUT CONTRAST MRA HEAD WITHOUT CONTRAST MRA NECK WITHOUT CONTRAST TECHNIQUE: Multiplanar, multiecho pulse sequences of the brain and surrounding structures were obtained without intravenous contrast. Angiographic images of the Circle of Willis were obtained using MRA technique without intravenous contrast. Angiographic images of the neck were obtained using MRA technique without intravenous contrast. Carotid stenosis measurements (when applicable) are obtained utilizing NASCET criteria, using the distal internal carotid diameter as the denominator. COMPARISON:  CT HEAD Jul 07, 2016 at 1607 hours in CT angiogram of the head and neck June 06, 2016 FINDINGS: MRI HEAD FINDINGS BRAIN: Subcentimeter focus of reduced diffusion RIGHT occipital lobe with T2 shine through. LEFT frontal periventricular 6 mm lesion with low ADC values. Reduced diffusion, susceptibility artifact, it intermediate T1 and bright FLAIR  signal LEFT occipital lobe measuring 2.2 x 1.5 cm consistent with subacute to chronic hemorrhage. A few punctate chronic micro hemorrhages. 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 Moderate to severe ventricular on the basis of global parenchymal brain volume loss. A few scattered punctate supratentorial white matter FLAIR T2 hyperintensities are less than expected for age. No midline shift or mass effect. No abnormal extra-axial fluid collections. VASCULAR: Normal major intracranial vascular flow voids present at skull base. SKULL AND UPPER CERVICAL SPINE: No abnormal sellar expansion. No suspicious calvarial bone marrow signal. Craniocervical junction maintained. SINUSES/ORBITS: The trace paranasal sinus mucosal thickening. Mastoid air cells are well aerated. The included ocular globes and orbital contents are non-suspicious. Status post bilateral ocular lens implants. OTHER: None. MRA HEAD FINDINGS- due to porous signal to noise ratio, poor characterization of circle of Willis and distal vessels. ANTERIOR CIRCULATION: Decreased flow related enhancement LEFT internal carotid artery, normal in caliber and, normal through the cavernous segment. Normal flow related enhancement RIGHT internal carotid artery. Bilateral A2 segments arise from RIGHT A1-2 junction. Bilateral anterior middle cerebral arteries are patent. POSTERIOR CIRCULATION: Codominant. Basilar artery is patent, with normal flow related enhancement of the main branch vessels. Normal flow related enhancement of the posterior cerebral arteries. ANATOMIC VARIANTS: Aplastic LEFT A1 segment. MRA NECK FINDINGS- limited time-of-flight technique, general poor signal to noise ratio. ANTERIOR CIRCULATION: The common carotid arteries are widely patent bilaterally. The carotid bifurcations are patent bilaterally without hemodynamically significant stenosis by NASCET criteria. No flow limiting stenosis or luminal irregularity. POSTERIOR CIRCULATION: Bilateral  vertebral arteries are patent to the vertebrobasilar junction. No flow limiting stenosis or luminal irregularity. Source images and MIP images were reviewed. IMPRESSION: MRI HEAD: Acute 6 mm LEFT frontal periventricular infarct. Subacute to old RIGHT frontal lobe infarct corresponding to CT abnormality. Subacute LEFT occipital intraparenchymal 2.2 x 1.5 cm hematoma. Moderate to severe parenchymal brain volume loss. MRA HEAD: Limited assessment due to poor signal to noise ratio without emergent large vessel occlusion. MRA NECK: Limited assessment due to poor signal to noise ratio without significant stenosis. Electronically Signed   By: Awilda Metroourtnay  Bloomer M.D.   On: 07/07/2016 19:35   Mr Brain Wo Contrast  Result Date: 07/07/2016 CLINICAL DATA:  Confusion, blurry vision and dizziness beginning this morning. History of intra cranial hemorrhage 2 months ago, hypertension, hyperlipidemia, dementia. EXAM: MRI HEAD WITHOUT CONTRAST MRA HEAD WITHOUT CONTRAST MRA NECK WITHOUT CONTRAST TECHNIQUE: Multiplanar, multiecho pulse sequences of the brain and surrounding structures were obtained without intravenous contrast. Angiographic images of the Circle of Willis were obtained using MRA technique without intravenous contrast. Angiographic images of the neck were obtained using MRA technique without intravenous contrast. Carotid stenosis measurements (when applicable) are obtained utilizing NASCET  criteria, using the distal internal carotid diameter as the denominator. COMPARISON:  CT HEAD Jul 07, 2016 at 1607 hours in CT angiogram of the head and neck June 06, 2016 FINDINGS: MRI HEAD FINDINGS BRAIN: Subcentimeter focus of reduced diffusion RIGHT occipital lobe with T2 shine through. LEFT frontal periventricular 6 mm lesion with low ADC values. Reduced diffusion, susceptibility artifact, it intermediate T1 and bright FLAIR signal LEFT occipital lobe measuring 2.2 x 1.5 cm consistent with subacute to chronic hemorrhage. A few  punctate chronic micro hemorrhages. 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 Moderate to severe ventricular on the basis of global parenchymal brain volume loss. A few scattered punctate supratentorial white matter FLAIR T2 hyperintensities are less than expected for age. No midline shift or mass effect. No abnormal extra-axial fluid collections. VASCULAR: Normal major intracranial vascular flow voids present at skull base. SKULL AND UPPER CERVICAL SPINE: No abnormal sellar expansion. No suspicious calvarial bone marrow signal. Craniocervical junction maintained. SINUSES/ORBITS: The trace paranasal sinus mucosal thickening. Mastoid air cells are well aerated. The included ocular globes and orbital contents are non-suspicious. Status post bilateral ocular lens implants. OTHER: None. MRA HEAD FINDINGS- due to porous signal to noise ratio, poor characterization of circle of Willis and distal vessels. ANTERIOR CIRCULATION: Decreased flow related enhancement LEFT internal carotid artery, normal in caliber and, normal through the cavernous segment. Normal flow related enhancement RIGHT internal carotid artery. Bilateral A2 segments arise from RIGHT A1-2 junction. Bilateral anterior middle cerebral arteries are patent. POSTERIOR CIRCULATION: Codominant. Basilar artery is patent, with normal flow related enhancement of the main branch vessels. Normal flow related enhancement of the posterior cerebral arteries. ANATOMIC VARIANTS: Aplastic LEFT A1 segment. MRA NECK FINDINGS- limited time-of-flight technique, general poor signal to noise ratio. ANTERIOR CIRCULATION: The common carotid arteries are widely patent bilaterally. The carotid bifurcations are patent bilaterally without hemodynamically significant stenosis by NASCET criteria. No flow limiting stenosis or luminal irregularity. POSTERIOR CIRCULATION: Bilateral vertebral arteries are patent to the vertebrobasilar junction. No flow limiting stenosis or luminal  irregularity. Source images and MIP images were reviewed. IMPRESSION: MRI HEAD: Acute 6 mm LEFT frontal periventricular infarct. Subacute to old RIGHT frontal lobe infarct corresponding to CT abnormality. Subacute LEFT occipital intraparenchymal 2.2 x 1.5 cm hematoma. Moderate to severe parenchymal brain volume loss. MRA HEAD: Limited assessment due to poor signal to noise ratio without emergent large vessel occlusion. MRA NECK: Limited assessment due to poor signal to noise ratio without significant stenosis. Electronically Signed   By: Awilda Metro M.D.   On: 07/07/2016 19:35   US Carotid Bilateral (at [facility] Only)  Result Date: 07/08/2016 CLINICAL DATA:  Hypertension. Syncope. Visual disturbance. Hyperlipidemia. EXAM: BILATERAL CAROTID DUPLEX ULTRASOUND TECHNIQUE: Wallace Cullens scale imaging, color Doppler and duplex ultrasound were performed of bilateral carotid and vertebral arteries in the neck. COMPARISON:  None. FINDINGS: Criteria: Quantification of carotid stenosis is based on velocity parameters that correlate the residual internal carotid diameter with NASCET-based stenosis levels, using the diameter of the distal internal carotid lumen as the denominator for stenosis measurement. The following velocity measurements were obtained: RIGHT ICA:  65 cm/sec CCA:  63 cm/sec SYSTOLIC ICA/CCA RATIO:  1.0 DIASTOLIC ICA/CCA RATIO:  1.6 ECA:  58 cm/sec LEFT ICA:  55 cm/sec CCA:  87 cm/sec SYSTOLIC ICA/CCA RATIO:  0.6 DIASTOLIC ICA/CCA RATIO:  1.0 ECA:  56 cm/sec RIGHT CAROTID ARTERY: Little if any plaque in the bulb. Low resistance internal carotid Doppler pattern. Irregular cardiac rhythm is noted. RIGHT VERTEBRAL ARTERY:  Antegrade. LEFT CAROTID ARTERY: Mild irregular plaque in the bulb. Low resistance internal carotid Doppler pattern is preserved. LEFT VERTEBRAL ARTERY:  Antegrade. IMPRESSION: Less than 50% stenosis in the right and left internal carotid arteries. Electronically Signed   By: Jolaine Click  M.D.   On: 07/08/2016 13:57   Ct Head Code Stroke W/o Cm  Result Date: 07/07/2016 CLINICAL DATA:  Code stroke. Increased confusion, nausea, blurred vision, and dizziness beginning at 11 a.m. today, 5 hours ago. Initial encounter. Intracranial hemorrhage 2 months ago. EXAM: CT HEAD WITHOUT CONTRAST TECHNIQUE: Contiguous axial images were obtained from the base of the skull through the vertex without intravenous contrast. COMPARISON:  CTA head and neck 06/06/2016. FINDINGS: Brain: Encephalomalacia is noted within the left occipital lobe in the area of previous hemorrhage. This is expected evolution. A new lucency is present in the right corona radiata anterior to the precentral sulcus. There is no cortical involvement. Mild generalized atrophy is otherwise stable. Brainstem is within normal limits. The cerebellum is stable. No acute hemorrhage or mass lesion is present. No significant extra-axial fluid collection is present. Vascular: The vessels are somewhat hyperdense, but symmetric. Atherosclerotic calcifications are present in the cavernous internal carotid artery's as before. Skull: The calvarium is intact. No focal lytic or blastic lesions are present. Sinuses/Orbits: The paranasal sinuses and mastoid air cells are clear. Bilateral lens replacements are present. The globes and orbits are within normal limits. ASPECTS (Alberta Stroke Program Early CT Score) - Ganglionic level infarction (caudate, lentiform nuclei, internal capsule, insula, M1-M3 cortex): 7/7 - Supraganglionic infarction (M4-M6 cortex): 3/3 Total score (0-10 with 10 being normal): 10/10 IMPRESSION: 1. New 7 mm white matter hypodensity in the anterior right frontal lobe, anterior to the precentral gyrus. This may represent an acute/subacute white matter infarct. 2. No acute cortical lesion. 3. Expected evolution of encephalomalacia involving the left occipital lobe, the site of prior hemorrhage. 4. ASPECTS is 10/10 These results were called by  telephone at the time of interpretation on 07/07/2016 at 4:20 pm to Dr. Minna Antis , who verbally acknowledged these results. Electronically Signed   By: Marin Roberts M.D.   On: 07/07/2016 16:22     Management plans discussed with the patient, family and they are in agreement.  CODE STATUS:     Code Status Orders        Start     Ordered   07/07/16 1835  Full code  Continuous     07/07/16 1836    Code Status History    Date Active Date Inactive Code Status Order ID Comments User Context   04/12/2016 11:28 PM 04/15/2016  5:44 PM Full Code [id number]  Oralia Manis, MD Inpatient   03/27/2016  5:09 PM 03/28/2016 10:09 PM Full Code [id number]  Ramonita Lab, MD Inpatient   02/24/2015  3:23 PM 02/25/2015  5:43 PM Full Code [id number]  Katharina Caper, MD Inpatient   12/10/2014  6:46 AM 12/14/2014  2:16 PM Full Code [id number]  Arnaldo Natal, MD Inpatient    Advance Directive Documentation     Most Recent Value  Type of Advance Directive  Healthcare Power of Attorney  Pre-existing out of facility DNR order (yellow form or pink MOST form)  -  "MOST" Form in Place?  -      TOTAL TIME TAKING CARE OF THIS PATIENT: * 40 minutes.    Zailey Audia M.D on 07/09/2016 at 8:22 AM  Between 7am to 6pm - Pager - [phone] After 6pm go to  [email or web address] - Social research officer, government  [facility]  Office  [phone]  CC: Primary care physician; Marguarite Arbour, MD

## 2016-07-09 NOTE — Plan of Care (Signed)
Problem: Safety: Goal: Ability to remain free from injury will improve Outcome: Progressing High fall risk, exit alarm activated. Pt demonstrates understanding of use of call light.

## 2016-07-09 NOTE — Progress Notes (Signed)
Discharge instructions along with home medications and follow up gone over with patient and family member. Both verbalize that they understood instructions. No prescriptions given to patient. IV and tele removed. Pt being discharged home on room air, no distress noted. [facility], RN 

## 2016-07-13 ENCOUNTER — Ambulatory Visit
Admission: RE | Admit: 2016-07-13 | Discharge: 2016-07-13 | Disposition: A | Discharge status: Home / Self Care | Payer: Medicare Other | Source: Ambulatory Visit | Attending: Urology | Admitting: Urology

## 2016-07-13 DIAGNOSIS — N400 Enlarged prostate without lower urinary tract symptoms: Secondary | ICD-10-CM | POA: Diagnosis not present

## 2016-07-13 DIAGNOSIS — N200 Calculus of kidney: Secondary | ICD-10-CM | POA: Diagnosis not present

## 2016-07-13 DIAGNOSIS — N201 Calculus of ureter: Secondary | ICD-10-CM

## 2016-07-14 ENCOUNTER — Telehealth: Payer: Self-pay

## 2016-07-14 NOTE — Telephone Encounter (Signed)
-----   Message from [name], MD sent at 07/13/2016  2:49 PM EDT ----- Please let this patient know that his renal ultrasound looks fine. We will discuss in detail with him at his follow-up appointment as scheduled.  [name], MD

## 2016-07-14 NOTE — Telephone Encounter (Signed)
No answer

## 2016-07-19 NOTE — Telephone Encounter (Signed)
Spoke with pt in reference to RUS results. Pt voiced understanding.  

## 2016-07-20 ENCOUNTER — Ambulatory Visit
Admission: RE | Admit: 2016-07-20 | Discharge: 2016-07-20 | Disposition: A | Discharge status: Home / Self Care | Payer: Medicare Other | Source: Ambulatory Visit | Attending: Urology | Admitting: Urology

## 2016-07-20 ENCOUNTER — Ambulatory Visit: Payer: Medicare Other | Admitting: Urology

## 2016-07-20 DIAGNOSIS — N200 Calculus of kidney: Secondary | ICD-10-CM | POA: Insufficient documentation

## 2016-07-21 ENCOUNTER — Ambulatory Visit: Payer: Medicare Other

## 2016-07-26 ENCOUNTER — Other Ambulatory Visit: Payer: Self-pay | Admitting: Internal Medicine

## 2016-07-26 DIAGNOSIS — I619 Nontraumatic intracerebral hemorrhage, unspecified: Secondary | ICD-10-CM

## 2016-08-04 ENCOUNTER — Ambulatory Visit: Payer: Medicare Other | Attending: Internal Medicine

## 2016-08-08 ENCOUNTER — Ambulatory Visit
Admission: RE | Admit: 2016-08-08 | Discharge: 2016-08-08 | Disposition: A | Discharge status: Home / Self Care | Payer: Medicare Other | Source: Ambulatory Visit | Attending: Internal Medicine | Admitting: Internal Medicine

## 2016-08-08 DIAGNOSIS — G319 Degenerative disease of nervous system, unspecified: Secondary | ICD-10-CM | POA: Insufficient documentation

## 2016-08-08 DIAGNOSIS — I619 Nontraumatic intracerebral hemorrhage, unspecified: Secondary | ICD-10-CM

## 2016-08-08 DIAGNOSIS — G9389 Other specified disorders of brain: Secondary | ICD-10-CM | POA: Insufficient documentation

## 2016-08-16 ENCOUNTER — Ambulatory Visit (INDEPENDENT_AMBULATORY_CARE_PROVIDER_SITE_OTHER): Payer: Medicare Other | Admitting: Urology

## 2016-08-16 ENCOUNTER — Encounter: Payer: Self-pay | Admitting: Urology

## 2016-08-16 VITALS — BP 114/64 | HR 93 | Ht 69.0 in | Wt 155.4 lb

## 2016-08-16 DIAGNOSIS — I639 Cerebral infarction, unspecified: Secondary | ICD-10-CM

## 2016-08-16 DIAGNOSIS — N200 Calculus of kidney: Secondary | ICD-10-CM | POA: Diagnosis not present

## 2016-08-16 NOTE — Progress Notes (Signed)
08/16/2016 12:22 PM   William Pigeon Sr. [birth year] [id number]  Referring provider: Marguarite Arbour, MD [address] [facility] [place], [place] [address]  Chief Complaint  Patient presents with  . Nephrolithiasis    HPI: The patient is a 83 year old gentleman presents today for follow-up.  1 - Nephrolithiasis - s/p bilateral ureteroscopy with left laser lithotripsy to stone free 04/11/16 for bilateral nephrolithiasis. KUB today with no obvious stones. He does have history of recurrent nephrolithiasis. Stones are 95% calcium oxide monohydrate and 5% calcium phosphate. He has a multiple occasions had bilateral obstructing ureteral stones with sepsis.    PMH: Past Medical History:  Diagnosis Date  . Arthritis   . Atrial fibrillation (HCC) 2016   paroxysmal atrial fibrillation, sick sinus syndrome  . Complication of anesthesia    affects memory for a longer time after  . Dementia    alzheimers  . Essential hypertension   . GERD (gastroesophageal reflux disease)   . History of kidney stones   . Hyperlipemia   . Inguinal hernia recurrent bilateral   . Kidney stones     Surgical History: Past Surgical History:  Procedure Laterality Date  . CYSTOSCOPY W/ RETROGRADES Bilateral 03/27/2016   Procedure: CYSTOSCOPY WITH RETROGRADE PYELOGRAM;  Surgeon: Vanna Scotland, MD;  Location: [facility];  Service: Urology;  Laterality: Bilateral;  . CYSTOSCOPY W/ URETERAL STENT PLACEMENT Bilateral 02/24/2015   Procedure: CYSTOSCOPY WITH STENT REPLACEMENT;  Surgeon: Hildred Laser, MD;  Location: [facility];  Service: Urology;  Laterality: Bilateral;  . CYSTOSCOPY W/ URETERAL STENT PLACEMENT Left 04/11/2016   Procedure: CYSTOSCOPY WITH STENT REPLACEMENT;  Surgeon: Vanna Scotland, MD;  Location: [facility];  Service: Urology;  Laterality: Left;  . CYSTOSCOPY W/ URETERAL STENT REMOVAL Right 04/11/2016   Procedure: CYSTOSCOPY WITH STENT REMOVAL;  Surgeon: Vanna Scotland, MD;   Location: [facility];  Service: Urology;  Laterality: Right;  . CYSTOSCOPY WITH RETROGRADE PYELOGRAM, URETEROSCOPY AND STENT PLACEMENT Bilateral 12/10/2014   Procedure: CYSTOSCOPY WITH RETROGRADE PYELOGRAM, URETEROSCOPY AND STENT PLACEMENT;  Surgeon: Hildred Laser, MD;  Location: [facility];  Service: Urology;  Laterality: Bilateral;  . CYSTOSCOPY WITH STENT PLACEMENT Bilateral 12/10/2014   Procedure: CYSTOSCOPY WITH STENT PLACEMENT;  Surgeon: Hildred Laser, MD;  Location: [facility];  Service: Urology;  Laterality: Bilateral;  . CYSTOSCOPY WITH STENT PLACEMENT Bilateral 03/27/2016   Procedure: CYSTOSCOPY WITH STENT PLACEMENT;  Surgeon: Vanna Scotland, MD;  Location: [facility];  Service: Urology;  Laterality: Bilateral;  . EXTRACORPOREAL SHOCK WAVE LITHOTRIPSY Right 01/07/2015   Procedure: EXTRACORPOREAL SHOCK WAVE LITHOTRIPSY (ESWL);  Surgeon: Lorraine Lax, MD;  Location: [facility];  Service: Urology;  Laterality: Right;  . EXTRACORPOREAL SHOCK WAVE LITHOTRIPSY Left 01/21/2015   Procedure: EXTRACORPOREAL SHOCK WAVE LITHOTRIPSY (ESWL);  Surgeon: Hildred Laser, MD;  Location: [facility];  Service: Urology;  Laterality: Left;  . EYE SURGERY Bilateral    Cataract Extraction with IOL  . HERNIA REPAIR Bilateral    inguinal  . URETEROSCOPY WITH HOLMIUM LASER LITHOTRIPSY Bilateral 02/24/2015   Procedure: URETEROSCOPY WITH HOLMIUM LASER LITHOTRIPSY /STONE BASKETING;  Surgeon: Hildred Laser, MD;  Location: [facility];  Service: Urology;  Laterality: Bilateral;  . URETEROSCOPY WITH HOLMIUM LASER LITHOTRIPSY Bilateral 04/11/2016   Procedure: URETEROSCOPY WITH HOLMIUM LASER LITHOTRIPSY;  Surgeon: Vanna Scotland, MD;  Location: [facility];  Service: Urology;  Laterality: Bilateral;    Home Medications:  Allergies as of 08/16/2016      Reactions   Codeine Other (  See Comments)   Causes side effects   Penicillin G Rash   Sulfa Antibiotics Rash      Medication List       Accurate as of 08/16/16 12:22  PM. Always use your most recent med list.          apixaban 2.5 MG Tabs tablet Commonly known as:  ELIQUIS Take 2.5 mg by mouth 2 (two) times daily.   hydrochlorothiazide 25 MG tablet Commonly known as:  HYDRODIURIL Take 25 mg by mouth daily.   metoprolol succinate 25 MG 24 hr tablet Commonly known as:  TOPROL-XL Take 25 mg by mouth daily.   ondansetron 4 MG disintegrating tablet Commonly known as:  ZOFRAN ODT Take 1 tablet (4 mg total) by mouth every 4 (four) hours as needed for nausea or vomiting.   pantoprazole 40 MG tablet Commonly known as:  PROTONIX Take 40 mg by mouth daily.   rivastigmine 3 MG capsule Commonly known as:  EXELON Take 3 mg by mouth 2 (two) times daily.   simvastatin 20 MG tablet Commonly known as:  ZOCOR Take 20 mg by mouth daily.   traMADol 50 MG tablet Commonly known as:  ULTRAM Take 1 tablet (50 mg total) by mouth every 6 (six) hours as needed for moderate pain.       Allergies:  Allergies  Allergen Reactions  . Codeine Other (See Comments)    Causes side effects  . Penicillin G Rash  . Sulfa Antibiotics Rash    Family History: Family History  Problem Relation Age of Onset  . Heart attack Mother   . Heart attack Father   . Hypertension Unknown   . Kidney disease Neg Hx   . Prostate cancer Neg Hx     Social History:  reports that he has never smoked. He has never used smokeless tobacco. He reports that he does not drink alcohol or use drugs.  ROS: UROLOGY Frequent Urination?: No Hard to postpone urination?: No Burning/pain with urination?: No Get up at night to urinate?: No Leakage of urine?: No Urine stream starts and stops?: No Trouble starting stream?: No Do you have to strain to urinate?: No Blood in urine?: No Urinary tract infection?: No Sexually transmitted disease?: No Injury to kidneys or bladder?: No Painful intercourse?: No Weak stream?: No Erection problems?: No Penile pain?:  No  Gastrointestinal Nausea?: No Vomiting?: No Indigestion/heartburn?: No Diarrhea?: No Constipation?: No  Constitutional Fever: No Night sweats?: No Weight loss?: No Fatigue?: No  Skin Skin rash/lesions?: No Itching?: No  Eyes Blurred vision?: Yes Double vision?: No  Ears/Nose/Throat Sore throat?: No Sinus problems?: No  Hematologic/Lymphatic Swollen glands?: No Easy bruising?: No  Cardiovascular Leg swelling?: No Chest pain?: No  Respiratory Cough?: No Shortness of breath?: No  Endocrine Excessive thirst?: No  Musculoskeletal Back pain?: Yes Joint pain?: No  Neurological Headaches?: No Dizziness?: No  Psychologic Depression?: No Anxiety?: No  Physical Exam: BP 114/64 (BP Location: Left Arm, Patient Position: Sitting, Cuff Size: Normal)   Pulse 93   Ht 5\' 9"  (1.753 m)   Wt 155 lb 6.4 oz (70.5 kg)   BMI 22.95 kg/m   Constitutional:  Alert and oriented, No acute distress. HEENT: [place] AT, moist mucus membranes.  Trachea midline, no masses. Cardiovascular: No clubbing, cyanosis, or edema. Respiratory: Normal respiratory effort, no increased work of breathing. GI: Abdomen is soft, nontender, nondistended, no abdominal masses GU: No CVA tenderness.  Skin: No rashes, bruises or suspicious lesions. Lymph: No cervical  or inguinal adenopathy. Neurologic: Grossly intact, no focal deficits, moving all 4 extremities. Psychiatric: Normal mood and affect.  Laboratory Data: Lab Results  Component Value Date   WBC 14.0 (H) 07/07/2016   HGB 16.7 07/07/2016   HCT 49.6 07/07/2016   MCV 90.0 07/07/2016   PLT 284 07/07/2016    Lab Results  Component Value Date   CREATININE 1.31 (H) 07/07/2016    No results found for: PSA  No results found for: TESTOSTERONE  Lab Results  Component Value Date   HGBA1C 6.9 (H) 07/08/2016    Urinalysis    Component Value Date/Time   COLORURINE YELLOW (A) 05/12/2016 1217   APPEARANCEUR CLEAR (A) 05/12/2016 1217    APPEARANCEUR Cloudy (A) 04/18/2016 1525   LABSPEC 1.014 05/12/2016 1217   PHURINE 5.0 05/12/2016 1217   GLUCOSEU NEGATIVE 05/12/2016 1217   HGBUR SMALL (A) 05/12/2016 1217   BILIRUBINUR NEGATIVE 05/12/2016 1217   BILIRUBINUR Negative 04/18/2016 1525   KETONESUR NEGATIVE 05/12/2016 1217   PROTEINUR 30 (A) 05/12/2016 1217   NITRITE NEGATIVE 05/12/2016 1217   LEUKOCYTESUR NEGATIVE 05/12/2016 1217   LEUKOCYTESUR Trace (A) 04/18/2016 1525    Pertinent Imaging: KUB reviewed as above  Assessment & Plan:    1. Recurrent nephrolithiasis He currently has no significant evidence of disease on KUB today. Due to his recurrent nephrolithiasis that forms relatively quickly and his multiple abscesses of sepsis from obstructing stones, I think he would benefit from a 24-hour urine study. We will order this study at this time. He'll follow-up to discuss the results.  Return in about 6 weeks (around 09/27/2016) for litholink results.  Hildred Laser, MD  [facility] [address] [place], [place] [address] [phone]

## 2016-09-01 ENCOUNTER — Other Ambulatory Visit: Payer: Medicare Other

## 2016-09-05 ENCOUNTER — Ambulatory Visit: Payer: Medicare Other | Attending: Internal Medicine

## 2016-09-05 ENCOUNTER — Encounter: Payer: Self-pay | Admitting: Physical Therapy

## 2016-09-05 VITALS — BP 130/64 | HR 73

## 2016-09-05 DIAGNOSIS — R2681 Unsteadiness on feet: Secondary | ICD-10-CM | POA: Insufficient documentation

## 2016-09-05 NOTE — Therapy (Addendum)
[facility] MAIN [facility] SERVICES [address] [place], [place], [address] Phone: [phone]   Fax:  [phone]  Physical Therapy Evaluation  Patient Details  Name: William STURGES Sr. MRN: [id number] Date of Birth: 1933-01-08 Referring Provider: Aram Beecham D  Encounter Date: 09/05/2016      PT End of Session - 09/05/16 1457    Visit Number 1   Number of Visits 17   Date for PT Re-Evaluation 11-15-16   Authorization Type g codes 1/10   PT Start Time 1430   PT Stop Time 1530   PT Time Calculation (min) 60 min   Equipment Utilized During Treatment Gait belt   Activity Tolerance Patient tolerated treatment well   Behavior During Therapy WFL for tasks assessed/performed      Past Medical History:  Diagnosis Date  . Arthritis   . Atrial fibrillation (HCC) 2016   paroxysmal atrial fibrillation, sick sinus syndrome  . Complication of anesthesia    affects memory for a longer time after  . Dementia    alzheimers  . Essential hypertension   . GERD (gastroesophageal reflux disease)   . History of kidney stones   . Hyperlipemia   . Inguinal hernia recurrent bilateral   . Kidney stones     Past Surgical History:  Procedure Laterality Date  . CYSTOSCOPY W/ RETROGRADES Bilateral 03/27/2016   Procedure: CYSTOSCOPY WITH RETROGRADE PYELOGRAM;  Surgeon: Vanna Scotland, MD;  Location: [facility];  Service: Urology;  Laterality: Bilateral;  . CYSTOSCOPY W/ URETERAL STENT PLACEMENT Bilateral 02/24/2015   Procedure: CYSTOSCOPY WITH STENT REPLACEMENT;  Surgeon: Hildred Laser, MD;  Location: [facility];  Service: Urology;  Laterality: Bilateral;  . CYSTOSCOPY W/ URETERAL STENT PLACEMENT Left 04/11/2016   Procedure: CYSTOSCOPY WITH STENT REPLACEMENT;  Surgeon: Vanna Scotland, MD;  Location: [facility];  Service: Urology;  Laterality: Left;  . CYSTOSCOPY W/ URETERAL STENT REMOVAL Right 04/11/2016   Procedure: CYSTOSCOPY WITH STENT REMOVAL;  Surgeon:  Vanna Scotland, MD;  Location: [facility];  Service: Urology;  Laterality: Right;  . CYSTOSCOPY WITH RETROGRADE PYELOGRAM, URETEROSCOPY AND STENT PLACEMENT Bilateral 12/10/2014   Procedure: CYSTOSCOPY WITH RETROGRADE PYELOGRAM, URETEROSCOPY AND STENT PLACEMENT;  Surgeon: Hildred Laser, MD;  Location: [facility];  Service: Urology;  Laterality: Bilateral;  . CYSTOSCOPY WITH STENT PLACEMENT Bilateral 12/10/2014   Procedure: CYSTOSCOPY WITH STENT PLACEMENT;  Surgeon: Hildred Laser, MD;  Location: [facility];  Service: Urology;  Laterality: Bilateral;  . CYSTOSCOPY WITH STENT PLACEMENT Bilateral 03/27/2016   Procedure: CYSTOSCOPY WITH STENT PLACEMENT;  Surgeon: Vanna Scotland, MD;  Location: [facility];  Service: Urology;  Laterality: Bilateral;  . EXTRACORPOREAL SHOCK WAVE LITHOTRIPSY Right 01/07/2015   Procedure: EXTRACORPOREAL SHOCK WAVE LITHOTRIPSY (ESWL);  Surgeon: Lorraine Lax, MD;  Location: [facility];  Service: Urology;  Laterality: Right;  . EXTRACORPOREAL SHOCK WAVE LITHOTRIPSY Left 01/21/2015   Procedure: EXTRACORPOREAL SHOCK WAVE LITHOTRIPSY (ESWL);  Surgeon: Hildred Laser, MD;  Location: [facility];  Service: Urology;  Laterality: Left;  . EYE SURGERY Bilateral    Cataract Extraction with IOL  . HERNIA REPAIR Bilateral    inguinal  . URETEROSCOPY WITH HOLMIUM LASER LITHOTRIPSY Bilateral 02/24/2015   Procedure: URETEROSCOPY WITH HOLMIUM LASER LITHOTRIPSY /STONE BASKETING;  Surgeon: Hildred Laser, MD;  Location: [facility];  Service: Urology;  Laterality: Bilateral;  . URETEROSCOPY WITH HOLMIUM LASER LITHOTRIPSY Bilateral 04/11/2016   Procedure: URETEROSCOPY WITH HOLMIUM LASER LITHOTRIPSY;  Surgeon: Vanna Scotland, MD;  Location: [facility];  Service: Urology;  Laterality: Bilateral;    Vitals:   09/05/16 1605  BP: 130/64  Pulse: 73  SpO2: 98%         Subjective Assessment - 09/05/16 1436    Subjective Patient describes feeling low on energy and feels like he cannot  participate in regular activities he was able to. Patient denies feeling weaker on one side and feels like he can get back into good shape. Patient admits balance has become worse but visual defecits have decreased significantly since CVAs. Patient complains of mild dizziness with change of position.   Patient is accompained by: Family member   Pertinent History Patient presents with history of kidney stones and a fib. Pateint presents multiple CVAs; first on was in Febuary 2018 and [facility] and reduced eliquis medication. Recovery went well. Second CVA was in May 2018    Limitations Walking   How long can you sit comfortably? unlimited   How long can you stand comfortably? 1 hour   How long can you walk comfortably? 1 hour   Patient Stated Goals Patient wants to be able to paint, outdoor projects, and tolerate longer walks   Currently in Pain? No/denies   Multiple Pain Sites No            [facility] PT Assessment - 09/05/16 1448      Assessment   Medical Diagnosis CVA   Referring Provider SPARKS, JEFFREY D   Onset Date/Surgical Date 06/24/16   Hand Dominance Right   Next MD Visit 09/06/16     Balance Screen   Has the patient fallen in the past 6 months No   Has the patient had a decrease in activity level because of a fear of falling?  Yes   Is the patient reluctant to leave their home because of a fear of falling?  Yes     Home Environment   Living Environment Private residence   Living Arrangements Spouse/significant other   Available Help at Discharge Family   Type of Home House   Home Access Stairs to enter   Entrance Stairs-Number of Steps 3   Entrance Stairs-Rails Can reach both   Home Layout One level   Home Equipment None     Prior Function   Level of Independence Independent   Vocation Retired   Leisure outdoor chores, recreational walking, Industrial/product designerpainting     Cognition   Overall Cognitive Status Within Functional Limits for tasks assessed     6  Minute Walk- Baseline   6 Minute Walk- Baseline yes   BP (mmHg) 130/64   HR (bpm) 73   02 Sat (%RA) 98 %     6 Minute walk- Post Test   6 Minute Walk Post Test yes   BP (mmHg) 146/78   HR (bpm) 77   02 Sat (%RA) 95 %            Objective measurements completed on examination: See above findings.     PAIN: No specific pain  POSTURE: Forward head, protracted scapulae bilaterally, ER hips, increased pronation of feet    PROM/AROM: WNL upper quarter and lower quarter   STRENGTH:  Graded on a 0-5 scale Muscle Group Left Right  Shoulder flex WNL WNL  Shoulder Abd WNL WNL  Shoulder Ext    Shoulder IR/ER    Elbow WNL WNL  Wrist/hand WNL WNL  Hip Flex 4/5 4/5  Hip Abd    Hip Add    Hip Ext  Hip IR/ER    Knee Flex WNL WNL  Knee Ext WNL WNL  Ankle DF WNL WNL  Ankle PF WNL WNL   SENSATION: WNL bilaterally    FUNCTIONAL MOBILITY: Able to sit to stand independently, independent with bed mobility, and WNL gait speed   BALANCE: Good balance in tandem stance bilaterally. Poor SLS ~ 3 seconds bilaterally   GAIT: Poor foot clearance ability, scuffs/drags feet bilaterally, decreased hip extension, decreased hip flexion, decreased knee flexion  OUTCOME MEASURES: TEST Outcome Interpretation  5 times sit<>stand 11.27 sec >60 yo, >15 sec indicates increased risk for falls  10 meter walk test               1.38  m/s <1.0 m/s indicates increased risk for falls; limited community ambulator  Timed up and Go              8.46   sec <14 sec indicates increased risk for falls  6 minute walk test             1180   Feet 1000 feet is community Financial controller  <36/56 (100% risk for falls), 37-45 (80% risk for falls); 46-51 (>50% risk for falls); 52-55 (lower risk <25% of falls)  9 Hole Peg Test L:                R:        Patient tolerated evaluation and outcome measures well today with no significant LOB or fatigue.             PT Education -  09/05/16 1540    Education provided Yes   Education Details Improving balance and increasing endurance for return to recreational activities without difficulty   Person(s) Educated Patient;Spouse   Methods Explanation   Comprehension Verbalized understanding             PT Long Term Goals - 09/05/16 1556      PT LONG TERM GOAL #1   Title Patient will increase distance of 6 minute walk test to 1570 ft to fall into WNL category for his age group.   Baseline 09/05/16 1180 ft   Time 8   Period Weeks   Status New     PT LONG TERM GOAL #2   Title Patient will tolerate 10 seconds of single leg stance without loss of balance to improve ability to get in and out of shower safely.   Baseline 09/05/16 3 seconds bilaterally   Time 8   Period Weeks   Status New     PT LONG TERM GOAL #3   Title Patient will be independent in home exercise program to improve strength/mobility for better functional independence with ADLs.   Baseline 09/05/16    Time 8   Period Weeks   Status New                Plan - 09/05/16 1541    Clinical Impression Statement Patient presents with referral from MD for physical therapy s/p multiple CVA.  Patient denies specific pain and abnormal sensation but complains of having less energy that is causing difficulty performing outdoor chores and recreationally exercise. Gross motor/strength screen reveled no strength discrepencies but patient demonstrates poor SL balance and decreased cardiovascular endurance.  Patient scored well on outcome measures used today, although there is room for improvement in 6 minute walk test.  Patient will benefit from skilled physical therapy to improve dynamic balance to improve ability to be  independent with recreational exercise and outdoor activities.    History and Personal Factors relevant to plan of care: This patient presents with 1 personal factors/ comorbidities: history of CVA and 1  body elements including body structures  and functions, activity limitations and or participation restrictions: decreased dynamic balance Patient's condition is stable.   Clinical Presentation Stable   Clinical Presentation due to: Sx of previous CVA are very minimal. Patient already describes feeling good.   Clinical Decision Making Low   Rehab Potential Good   PT Frequency 2x / week   PT Duration 8 weeks   PT Treatment/Interventions ADLs/Self Care Home Management;Aquatic Therapy;Cryotherapy;Electrical Stimulation;Moist Heat;Gait training;Stair training;Functional mobility training;Therapeutic activities;Therapeutic exercise;Balance training;Neuromuscular re-education;Patient/family education;Manual techniques;Passive range of motion;Dry needling;Energy conservation   PT Next Visit Plan Progress therex to begin challenging dynamic balance and gait training   PT Home Exercise Plan Walk for exercise everyday   Consulted and Agree with Plan of Care Patient;Family member/caregiver      Patient will benefit from skilled therapeutic intervention in order to improve the following deficits and impairments:  Abnormal gait, Decreased activity tolerance, Decreased balance, Decreased mobility, Decreased coordination, Decreased endurance, Dizziness, Difficulty walking  Visit Diagnosis: Unsteadiness on feet - Plan: PT plan of care cert/re-cert      G-Codes - 2016/09/30 1610    Functional Assessment Tool Used (Outpatient Only) , SLS balance, TUG, 5TSTS, 58m gait speed   Functional Limitation Mobility: Walking and moving around   Mobility: Walking and Moving Around Current Status [phone]) At least 1 percent but less than 20 percent impaired, limited or restricted   Mobility: Walking and Moving Around Goal Status [phone]) At least 1 percent but less than 20 percent impaired, limited or restricted       Problem List Patient Active Problem List   Diagnosis Date Noted  . Dizziness 07/07/2016  . Urolithiasis 03/28/2016  . Pyuria 03/28/2016   . Hydronephrosis 03/27/2016  . Cardiac arrhythmia 07/22/2015  . Atrial fibrillation with RVR (HCC) 02/24/2015  . Cardiac conduction disorder 02/04/2015  . Arthritis 02/04/2015  . Atrial fibrillation (HCC) 02/04/2015  . Acid reflux 02/04/2015  . H/O vertigo 02/04/2015  . HLD (hyperlipidemia) 02/04/2015  . BP (high blood pressure) 02/04/2015  . Calculus of kidney 02/04/2015  . Sick sinus syndrome (HCC) 02/04/2015  . Fungal infection of nail 02/04/2015  . Type 2 diabetes mellitus (HCC) 02/04/2015  . Sepsis (HCC) 12/10/2014  . Systemic infection (HCC) 12/10/2014  . Sepsis(995.91) 12/10/2014  . Altered bowel function 10/13/2014  . Chronic diarrhea 10/13/2014  . Change in bowel habits 10/13/2014  . Senile dementia of Alzheimer's type 09/20/2014  . Alzheimer's dementia, late onset 09/20/2014  . Drug resistance 10/22/2013    This entire session was performed under direct supervision and direction of a licensed therapist/therapist assistant . I have personally read, edited and approve of the note as written.   Madilyn Hook SPT  Lynnea Maizes PT, DPT   Huprich,Jason 2016-09-30, 11:55 AM  [facility] MAIN [facility] SERVICES [address] [place], [place], [address] Phone: [phone]   Fax:  [phone]  Name: William PINO Sr. MRN: [id number] Date of Birth: October 03, 1932

## 2016-09-11 ENCOUNTER — Other Ambulatory Visit: Payer: Self-pay | Admitting: Urology

## 2016-09-12 ENCOUNTER — Encounter: Payer: Self-pay | Admitting: Physical Therapy

## 2016-09-12 ENCOUNTER — Ambulatory Visit: Payer: Medicare Other | Admitting: Physical Therapy

## 2016-09-12 VITALS — BP 109/67 | HR 71

## 2016-09-12 DIAGNOSIS — R2681 Unsteadiness on feet: Secondary | ICD-10-CM

## 2016-09-12 NOTE — Therapy (Addendum)
[facility] [address] [place], [place], [address] Phone: [phone]   Fax:  [phone]  Physical Therapy Treatment  Patient Details  Name: William PigeonJames B Mojica Sr. MRN: [id number] Date of Birth: 04/02/1932 Referring Provider: Aram BeechamSPARKS, JEFFREY D  Encounter Date: 09/12/2016      PT End of Session - 09/12/16 1616    Visit Number 2   Number of Visits 17   Date for PT Re-Evaluation 10/31/16   Authorization Type g codes 2/10   PT Start Time 1600   PT Stop Time 1645   PT Time Calculation (min) 45 min   Equipment Utilized During Treatment Gait belt   Activity Tolerance Patient tolerated treatment well   Behavior During Therapy WFL for tasks assessed/performed      Past Medical History:  Diagnosis Date  . Arthritis   . Atrial fibrillation (HCC) 2016   paroxysmal atrial fibrillation, sick sinus syndrome  . Complication of anesthesia    affects memory for a longer time after  . Dementia    alzheimers  . Essential hypertension   . GERD (gastroesophageal reflux disease)   . History of kidney stones   . Hyperlipemia   . Inguinal hernia recurrent bilateral   . Kidney stones     Past Surgical History:  Procedure Laterality Date  . CYSTOSCOPY W/ RETROGRADES Bilateral 03/27/2016   Procedure: CYSTOSCOPY WITH RETROGRADE PYELOGRAM;  Surgeon: [name], MD;  Location: [facility];  Service: Urology;  Laterality: Bilateral;  . CYSTOSCOPY W/ URETERAL STENT PLACEMENT Bilateral 02/24/2015   Procedure: CYSTOSCOPY WITH STENT REPLACEMENT;  Surgeon: Hildred LaserBrian Teon Budzyn, MD;  Location: [facility];  Service: Urology;  Laterality: Bilateral;  . CYSTOSCOPY W/ URETERAL STENT PLACEMENT Left 04/11/2016   Procedure: CYSTOSCOPY WITH STENT REPLACEMENT;  Surgeon: [name], MD;  Location: [facility];  Service: Urology;  Laterality: Left;  . CYSTOSCOPY W/ URETERAL STENT REMOVAL Right 04/11/2016   Procedure: CYSTOSCOPY WITH STENT REMOVAL;  Surgeon: Vanna ScotlandAshley  Brandon, MD;  Location: [facility];  Service: Urology;  Laterality: Right;  . CYSTOSCOPY WITH RETROGRADE PYELOGRAM, URETEROSCOPY AND STENT PLACEMENT Bilateral 12/10/2014   Procedure: CYSTOSCOPY WITH RETROGRADE PYELOGRAM, URETEROSCOPY AND STENT PLACEMENT;  Surgeon: Hildred LaserBrian Jayvyn Budzyn, MD;  Location: [facility];  Service: Urology;  Laterality: Bilateral;  . CYSTOSCOPY WITH STENT PLACEMENT Bilateral 12/10/2014   Procedure: CYSTOSCOPY WITH STENT PLACEMENT;  Surgeon: Hildred LaserBrian Oswald Budzyn, MD;  Location: [facility];  Service: Urology;  Laterality: Bilateral;  . CYSTOSCOPY WITH STENT PLACEMENT Bilateral 03/27/2016   Procedure: CYSTOSCOPY WITH STENT PLACEMENT;  Surgeon: [name], MD;  Location: [facility];  Service: Urology;  Laterality: Bilateral;  . EXTRACORPOREAL SHOCK WAVE LITHOTRIPSY Right 01/07/2015   Procedure: EXTRACORPOREAL SHOCK WAVE LITHOTRIPSY (ESWL);  Surgeon: [name], MD;  Location: [facility];  Service: Urology;  Laterality: Right;  . EXTRACORPOREAL SHOCK WAVE LITHOTRIPSY Left 01/21/2015   Procedure: EXTRACORPOREAL SHOCK WAVE LITHOTRIPSY (ESWL);  Surgeon: Hildred LaserBrian Demico Budzyn, MD;  Location: [facility];  Service: Urology;  Laterality: Left;  . EYE SURGERY Bilateral    Cataract Extraction with IOL  . HERNIA REPAIR Bilateral    inguinal  . URETEROSCOPY WITH HOLMIUM LASER LITHOTRIPSY Bilateral 02/24/2015   Procedure: URETEROSCOPY WITH HOLMIUM LASER LITHOTRIPSY /STONE BASKETING;  Surgeon: Hildred LaserBrian Devaun Budzyn, MD;  Location: [facility];  Service: Urology;  Laterality: Bilateral;  . URETEROSCOPY WITH HOLMIUM LASER LITHOTRIPSY Bilateral 04/11/2016   Procedure: URETEROSCOPY WITH HOLMIUM LASER LITHOTRIPSY;  Surgeon: [name], MD;  Location: [facility];  Service: Urology;  Laterality: Bilateral;    Vitals:   09/12/16 1603  BP: 109/67  Pulse: 71  SpO2: 100%        Subjective Assessment - 09/12/16 1604    Subjective Patient describes feeling soreness in ankles bilaterally due to painting over the  weekend. Patient describes "his brain is slowly working itself back to normal".    Patient is accompained by: Family member   Pertinent History Patient presents with history of kidney stones and a fib. Pateint presents multiple CVAs; first on was in Febuary 2018 and was admitted to [facility] and reduced eliquis medication. Recovery went well. Second CVA was in May 2018    Limitations Walking   How long can you sit comfortably? unlimited   How long can you stand comfortably? 1 hour   How long can you walk comfortably? 1 hour   Patient Stated Goals Patient wants to be able to paint, outdoor projects, and tolerate longer walks   Currently in Pain? No/denies   Multiple Pain Sites No      Treatment:  Vitals taken before treatment Patient completed DGI 22/24 Rapid repeated movements for UE and LE: normal performance with UE bilaterally (finger to chin). Mild overshooting with LE bilaterally (heel to shin)   Nu Step warm up 5 minutes   Balance on bosu ball (rounded side down) 2 x 1 minute. Minimal LOB. Patient is able to easily get onto bosu ball and gather balance with UE.  Step-over obstacle course 8 laps: red bolster -> orange hurdle -> airex pad -> orange hurdle. Patient instructed to slow down to focus on foot clearing. Patient demonstrated improper foot clearing occasionally but improved with practice.   Single leg press 45 lbs 2 x 20 each. Patient's L LE fatigues much fast than R LE. One rest break necessary on L side to complete last set. Lunges onto bosu ball (rounded side up) 15x each. Patient instructed to put full body weight through lead leg and to explode off it back to starting position.  Good L LE fatigue today and patient seemed to have increased confidence in prognosis after session.                               PT Education - 09/12/16 1614    Education provided Yes   Education Details Improving balance and coordination    Person(s) Educated  Patient   Methods Explanation   Comprehension Verbalized understanding             PT Long Term Goals - 09/05/16 1556      PT LONG TERM GOAL #1   Title Patient will increase distance of 6 minute walk test to 1570 ft to fall into WNL category for his age group.   Baseline 09/05/16 1180 ft   Time 8   Period Weeks   Status New     PT LONG TERM GOAL #2   Title Patient will tolerate 10 seconds of single leg stance without loss of balance to improve ability to get in and out of shower safely.   Baseline 09/05/16 3 seconds bilaterally   Time 8   Period Weeks   Status New     PT LONG TERM GOAL #3   Title Patient will be independent in home exercise program to improve strength/mobility for better functional independence with ADLs.   Baseline 09/05/16    Time 8   Period Weeks  Status New               Plan - 09/12/16 1706    Clinical Impression Statement Patient completed DGI and scored 22/24, showing good dynamic balance necessary for community ambulation.  Rapid repeated movements of B UE was completed with good accuracy and speed; B LU was completed with mild overshooting and good speed.  Patient expressed being anxious about balance therex but tolerated it well with minimal LOB.  Patient shows moderate strength and endurance discrepency in L LE during leg press.  Patient required constant cueing for correct technique and to stay on task.  Patient will continue to benefit from skilled physical therapy to improve dynamic balance and strength to reduce risk of falls.   Rehab Potential Good   PT Frequency 2x / week   PT Duration 8 weeks   PT Treatment/Interventions ADLs/Self Care Home Management;Aquatic Therapy;Cryotherapy;Electrical Stimulation;Moist Heat;Gait training;Stair training;Functional mobility training;Therapeutic activities;Therapeutic exercise;Balance training;Neuromuscular re-education;Patient/family education;Manual techniques;Passive range of motion;Dry  needling;Energy conservation   PT Next Visit Plan Progress therex to begin challenging dynamic balance and gait training   PT Home Exercise Plan Walk for exercise everyday   Consulted and Agree with Plan of Care Patient;Family member/caregiver      Patient will benefit from skilled therapeutic intervention in order to improve the following deficits and impairments:  Abnormal gait, Decreased activity tolerance, Decreased balance, Decreased mobility, Decreased coordination, Decreased endurance, Dizziness, Difficulty walking  Visit Diagnosis: Unsteadiness on feet     Problem List Patient Active Problem List   Diagnosis Date Noted  . Dizziness 07/07/2016  . Urolithiasis 03/28/2016  . Pyuria 03/28/2016  . Hydronephrosis 03/27/2016  . Cardiac arrhythmia 07/22/2015  . Atrial fibrillation with RVR (HCC) 02/24/2015  . Cardiac conduction disorder 02/04/2015  . Arthritis 02/04/2015  . Atrial fibrillation (HCC) 02/04/2015  . Acid reflux 02/04/2015  . H/O vertigo 02/04/2015  . HLD (hyperlipidemia) 02/04/2015  . BP (high blood pressure) 02/04/2015  . Calculus of kidney 02/04/2015  . Sick sinus syndrome (HCC) 02/04/2015  . Fungal infection of nail 02/04/2015  . Type 2 diabetes mellitus (HCC) 02/04/2015  . Sepsis (HCC) 12/10/2014  . Systemic infection (HCC) 12/10/2014  . Sepsis(995.91) 12/10/2014  . Altered bowel function 10/13/2014  . Chronic diarrhea 10/13/2014  . Change in bowel habits 10/13/2014  . Senile dementia of Alzheimer's type 09/20/2014  . Alzheimer's dementia, late onset 09/20/2014  . Drug resistance 10/22/2013  This entire session was performed under direct supervision and direction of a licensed therapist/therapist assistant . I have personally read, edited and approve of the note as written. [facility] [address] Kristine S  Kristine [place], [place], DPT 09/13/2016, 9:45 AM  [place] [address] [facility] MAIN [facility] SERVICES [address] [place], [place], [address] Phone: [phone]   Fax:  [phone]  Name: William RODARTE Sr. MRN: [id number] Date of Birth: 1932/12/14

## 2016-09-14 ENCOUNTER — Encounter: Payer: Self-pay | Admitting: Physical Therapy

## 2016-09-14 ENCOUNTER — Ambulatory Visit: Payer: Medicare Other | Admitting: Physical Therapy

## 2016-09-14 VITALS — BP 124/67 | HR 106

## 2016-09-14 DIAGNOSIS — R2681 Unsteadiness on feet: Secondary | ICD-10-CM

## 2016-09-14 NOTE — Therapy (Signed)
[facility] [address] [place], [place], [address] Phone: [phone]   Fax:  [phone]  Physical Therapy Treatment  Patient Details  Name: William PigeonJames B Sledd Lozano. MRN: [id number] Date of Birth: 05/15/1932 Referring Provider: Aram BeechamSPARKS, JEFFREY D  Encounter Date: 09/14/2016      PT End of Session - 09/14/16 1651    Visit Number 3   Number of Visits 17   Date for PT Re-Evaluation 10/31/16   Authorization Type g codes 3/10   PT Start Time 1645   PT Stop Time 1730   PT Time Calculation (min) 45 min   Equipment Utilized During Treatment Gait belt   Activity Tolerance Patient tolerated treatment well   Behavior During Therapy WFL for tasks assessed/performed      Past Medical History:  Diagnosis Date  . Arthritis   . Atrial fibrillation (HCC) 2016   paroxysmal atrial fibrillation, sick sinus syndrome  . Complication of anesthesia    affects memory for a longer time after  . Dementia    alzheimers  . Essential hypertension   . GERD (gastroesophageal reflux disease)   . History of kidney stones   . Hyperlipemia   . Inguinal hernia recurrent bilateral   . Kidney stones     Past Surgical History:  Procedure Laterality Date  . CYSTOSCOPY W/ RETROGRADES Bilateral 03/27/2016   Procedure: CYSTOSCOPY WITH RETROGRADE PYELOGRAM;  Surgeon: [name], MD;  Location: [facility];  Service: Urology;  Laterality: Bilateral;  . CYSTOSCOPY W/ URETERAL STENT PLACEMENT Bilateral 02/24/2015   Procedure: CYSTOSCOPY WITH STENT REPLACEMENT;  Surgeon: [name], MD;  Location: [facility];  Service: Urology;  Laterality: Bilateral;  . CYSTOSCOPY W/ URETERAL STENT PLACEMENT Left 04/11/2016   Procedure: CYSTOSCOPY WITH STENT REPLACEMENT;  Surgeon: [name], MD;  Location: [facility];  Service: Urology;  Laterality: Left;  . CYSTOSCOPY W/ URETERAL STENT REMOVAL Right 04/11/2016   Procedure: CYSTOSCOPY WITH STENT REMOVAL;  Surgeon: Vanna ScotlandAshley  Brandon, MD;  Location: [facility];  Service: Urology;  Laterality: Right;  . CYSTOSCOPY WITH RETROGRADE PYELOGRAM, URETEROSCOPY AND STENT PLACEMENT Bilateral 12/10/2014   Procedure: CYSTOSCOPY WITH RETROGRADE PYELOGRAM, URETEROSCOPY AND STENT PLACEMENT;  Surgeon: [name], MD;  Location: [facility];  Service: Urology;  Laterality: Bilateral;  . CYSTOSCOPY WITH STENT PLACEMENT Bilateral 12/10/2014   Procedure: CYSTOSCOPY WITH STENT PLACEMENT;  Surgeon: [name], MD;  Location: [facility];  Service: Urology;  Laterality: Bilateral;  . CYSTOSCOPY WITH STENT PLACEMENT Bilateral 03/27/2016   Procedure: CYSTOSCOPY WITH STENT PLACEMENT;  Surgeon: [name], MD;  Location: [facility];  Service: Urology;  Laterality: Bilateral;  . EXTRACORPOREAL SHOCK WAVE LITHOTRIPSY Right 01/07/2015   Procedure: EXTRACORPOREAL SHOCK WAVE LITHOTRIPSY (ESWL);  Surgeon: [name], MD;  Location: [facility];  Service: Urology;  Laterality: Right;  . EXTRACORPOREAL SHOCK WAVE LITHOTRIPSY Left 01/21/2015   Procedure: EXTRACORPOREAL SHOCK WAVE LITHOTRIPSY (ESWL);  Surgeon: [name], MD;  Location: [facility];  Service: Urology;  Laterality: Left;  . EYE SURGERY Bilateral    Cataract Extraction with IOL  . HERNIA REPAIR Bilateral    inguinal  . URETEROSCOPY WITH HOLMIUM LASER LITHOTRIPSY Bilateral 02/24/2015   Procedure: URETEROSCOPY WITH HOLMIUM LASER LITHOTRIPSY /STONE BASKETING;  Surgeon: [name], MD;  Location: [facility];  Service: Urology;  Laterality: Bilateral;  . URETEROSCOPY WITH HOLMIUM LASER LITHOTRIPSY Bilateral 04/11/2016   Procedure: URETEROSCOPY WITH HOLMIUM LASER LITHOTRIPSY;  Surgeon: [name], MD;  Location: [facility];  Service: Urology;  Laterality: Bilateral;    Vitals:   09/14/16 1648  BP: 124/67  Pulse: (!) 106  SpO2: 100%        Subjective Assessment - 09/14/16 1648    Subjective Patient describes feeling okay today and has no soreness but has been active  with furniture work.   Pertinent History Patient presents with history of kidney stones and a fib. Pateint presents multiple CVAs; first on was in Febuary 2018 and [facility] and reduced eliquis medication. Recovery went well. Second CVA was in May 2018    Limitations Walking   How long can you sit comfortably? unlimited   How long can you stand comfortably? 1 hour   How long can you walk comfortably? 1 hour   Patient Stated Goals Patient wants to be able to paint, outdoor projects, and tolerate longer walks   Currently in Pain? No/denies   Multiple Pain Sites No      Vitals taken before treatment  Treatment:  Nu Step warm up 5 minutes   Single leg press 75 lbs 2 x 12 with L leg 2 x 15 with R leg. Patient strength and endurance impairment in L LE in comparison to R LE. Patient needs constant cues for eccentric control  Sit to stand from  26 inches holding 23 lbs 3 x 12. Patient needed moderate cueing for proper breathing during exercise.  Obstacle coarse 10 laps: orange hurdle -> floor -> orange hurdle -> airex -> large red bolster. Patient was able to show consistent foot clearing ability bilaterally and did not lose his balance during this exercise.  Patient progressed to being able to alternate feet through obstacle coarse with minimal difficulty Mini squats on BOSU (round side down) 3 x 10. Patient is able to easily get onto BOSU and did not lose balance. Patient displays poor squatting technique but has no pain. Rockerboard sideways weight shift 20 x each. Patient is hesitant to push down with L LE but improves with practice. Minor episodes of LOB. Rockerboard fwd/backward weight shift 20 x each. Patient shows same hesitation with L LE.  More trouble with commitment with L LE in back.  Patient displays good endurance today and did not require rest break.                           PT Education - 09/14/16 1651    Education provided Yes    Education Details POC to challenge dynamic balance to reduce fall risk   Person(s) Educated Patient   Methods Explanation   Comprehension Verbalized understanding             PT Long Term Goals - 09/05/16 1556      PT LONG TERM GOAL #1   Title Patient will increase distance of 6 minute walk test to 1570 ft to fall into WNL category for his age group.   Baseline 09/05/16 1180 ft   Time 8   Period Weeks   Status New     PT LONG TERM GOAL #2   Title Patient will tolerate 10 seconds of single leg stance without loss of balance to improve ability to get in and out of shower safely.   Baseline 09/05/16 3 seconds bilaterally   Time 8   Period Weeks   Status New     PT LONG TERM GOAL #3   Title Patient will be independent in home exercise program to improve  strength/mobility for better functional independence with ADLs.   Baseline 09/05/16    Time 8   Period Weeks   Status New               Plan - 09/14/16 1657    Clinical Impression Statement Patient experienced minimal fatigue from last session and agreed to increase intensity of therex. Patient demonstrated significant improvement with foot clearing ability and dynamic balance during obstacle coarse.  Patient displays hesitation with fully weight bearing on L LE that improved with rockerboard exercise.  Patient needs constant cueing to stay on task. Patient will continue to benefit from skilled physical therapy to improve dynamic balance and endurance to decrease risk of falls.    Rehab Potential Good   PT Frequency 2x / week   PT Duration 8 weeks   PT Treatment/Interventions ADLs/Self Care Home Management;Aquatic Therapy;Cryotherapy;Electrical Stimulation;Moist Heat;Gait training;Stair training;Functional mobility training;Therapeutic activities;Therapeutic exercise;Balance training;Neuromuscular re-education;Patient/family education;Manual techniques;Passive range of motion;Dry needling;Energy conservation   PT Next Visit  Plan Progress therex to begin challenging dynamic balance and gait training   PT Home Exercise Plan Walk for exercise everyday   Consulted and Agree with Plan of Care Patient;Family member/caregiver      Patient will benefit from skilled therapeutic intervention in order to improve the following deficits and impairments:  Abnormal gait, Decreased activity tolerance, Decreased balance, Decreased mobility, Decreased coordination, Decreased endurance, Dizziness, Difficulty walking  Visit Diagnosis: Unsteadiness on feet     Problem List Patient Active Problem List   Diagnosis Date Noted  . Dizziness 07/07/2016  . Urolithiasis 03/28/2016  . Pyuria 03/28/2016  . Hydronephrosis 03/27/2016  . Cardiac arrhythmia 07/22/2015  . Atrial fibrillation with RVR (HCC) 02/24/2015  . Cardiac conduction disorder 02/04/2015  . Arthritis 02/04/2015  . Atrial fibrillation (HCC) 02/04/2015  . Acid reflux 02/04/2015  . H/O vertigo 02/04/2015  . HLD (hyperlipidemia) 02/04/2015  . BP (high blood pressure) 02/04/2015  . Calculus of kidney 02/04/2015  . Sick sinus syndrome (HCC) 02/04/2015  . Fungal infection of nail 02/04/2015  . Type 2 diabetes mellitus (HCC) 02/04/2015  . Sepsis (HCC) 12/10/2014  . Systemic infection (HCC) 12/10/2014  . Sepsis(995.91) 12/10/2014  . Altered bowel function 10/13/2014  . Chronic diarrhea 10/13/2014  . Change in bowel habits 10/13/2014  . Senile dementia of Alzheimer's type 09/20/2014  . Alzheimer's dementia, late onset 09/20/2014  . Drug resistance 10/22/2013  This entire session was performed under direct supervision and direction of a licensed therapist/therapist assistant . I have personally read, edited and approve of the note as written. [facility] [address] Kristine S  Kristine [place], [place], DPT 09/14/2016, 5:48 PM  [facility] MAIN [facility] SERVICES [address] [place], [place], [address] Phone:  [phone]   Fax:  [phone]  Name: William Lozano Lozano. MRN: [id number] Date of Birth: May 26, 1932

## 2016-09-19 ENCOUNTER — Ambulatory Visit: Payer: Medicare Other | Admitting: Physical Therapy

## 2016-09-19 ENCOUNTER — Encounter: Payer: Self-pay | Admitting: Physical Therapy

## 2016-09-19 VITALS — BP 127/74 | HR 74

## 2016-09-19 DIAGNOSIS — R2681 Unsteadiness on feet: Secondary | ICD-10-CM | POA: Diagnosis not present

## 2016-09-19 NOTE — Therapy (Signed)
[facility] MAIN [facility] SERVICES [address] [place], [place], [address] Phone: [phone]   Fax:  [phone]  Physical Therapy Treatment  Patient Details  Name: William BRANNIGAN Sr. MRN: [id number] Date of Birth: Jul 24, 1932 Referring Provider: Aram Beecham D  Encounter Date: 09/19/2016      PT End of Session - 09/19/16 1400    Visit Number 4   Number of Visits 17   Date for PT Re-Evaluation 2016-11-03   Authorization Type g codes 4/10   PT Start Time 1345   PT Stop Time 1430   PT Time Calculation (min) 45 min   Equipment Utilized During Treatment Gait belt   Activity Tolerance Patient tolerated treatment well      Past Medical History:  Diagnosis Date  . Arthritis   . Atrial fibrillation (HCC) 2016   paroxysmal atrial fibrillation, sick sinus syndrome  . Complication of anesthesia    affects memory for a longer time after  . Dementia    alzheimers  . Essential hypertension   . GERD (gastroesophageal reflux disease)   . History of kidney stones   . Hyperlipemia   . Inguinal hernia recurrent bilateral   . Kidney stones     Past Surgical History:  Procedure Laterality Date  . CYSTOSCOPY W/ RETROGRADES Bilateral 03/27/2016   Procedure: CYSTOSCOPY WITH RETROGRADE PYELOGRAM;  Surgeon: Vanna Scotland, MD;  Location: [facility];  Service: Urology;  Laterality: Bilateral;  . CYSTOSCOPY W/ URETERAL STENT PLACEMENT Bilateral 02/24/2015   Procedure: CYSTOSCOPY WITH STENT REPLACEMENT;  Surgeon: Hildred Laser, MD;  Location: [facility];  Service: Urology;  Laterality: Bilateral;  . CYSTOSCOPY W/ URETERAL STENT PLACEMENT Left 04/11/2016   Procedure: CYSTOSCOPY WITH STENT REPLACEMENT;  Surgeon: Vanna Scotland, MD;  Location: [facility];  Service: Urology;  Laterality: Left;  . CYSTOSCOPY W/ URETERAL STENT REMOVAL Right 04/11/2016   Procedure: CYSTOSCOPY WITH STENT REMOVAL;  Surgeon: Vanna Scotland, MD;  Location: [facility];  Service: Urology;   Laterality: Right;  . CYSTOSCOPY WITH RETROGRADE PYELOGRAM, URETEROSCOPY AND STENT PLACEMENT Bilateral 12/10/2014   Procedure: CYSTOSCOPY WITH RETROGRADE PYELOGRAM, URETEROSCOPY AND STENT PLACEMENT;  Surgeon: Hildred Laser, MD;  Location: [facility];  Service: Urology;  Laterality: Bilateral;  . CYSTOSCOPY WITH STENT PLACEMENT Bilateral 12/10/2014   Procedure: CYSTOSCOPY WITH STENT PLACEMENT;  Surgeon: Hildred Laser, MD;  Location: [facility];  Service: Urology;  Laterality: Bilateral;  . CYSTOSCOPY WITH STENT PLACEMENT Bilateral 03/27/2016   Procedure: CYSTOSCOPY WITH STENT PLACEMENT;  Surgeon: Vanna Scotland, MD;  Location: [facility];  Service: Urology;  Laterality: Bilateral;  . EXTRACORPOREAL SHOCK WAVE LITHOTRIPSY Right 01/07/2015   Procedure: EXTRACORPOREAL SHOCK WAVE LITHOTRIPSY (ESWL);  Surgeon: Lorraine Lax, MD;  Location: [facility];  Service: Urology;  Laterality: Right;  . EXTRACORPOREAL SHOCK WAVE LITHOTRIPSY Left 01/21/2015   Procedure: EXTRACORPOREAL SHOCK WAVE LITHOTRIPSY (ESWL);  Surgeon: Hildred Laser, MD;  Location: [facility];  Service: Urology;  Laterality: Left;  . EYE SURGERY Bilateral    Cataract Extraction with IOL  . HERNIA REPAIR Bilateral    inguinal  . URETEROSCOPY WITH HOLMIUM LASER LITHOTRIPSY Bilateral 02/24/2015   Procedure: URETEROSCOPY WITH HOLMIUM LASER LITHOTRIPSY /STONE BASKETING;  Surgeon: Hildred Laser, MD;  Location: [facility];  Service: Urology;  Laterality: Bilateral;  . URETEROSCOPY WITH HOLMIUM LASER LITHOTRIPSY Bilateral 04/11/2016   Procedure: URETEROSCOPY WITH HOLMIUM LASER LITHOTRIPSY;  Surgeon: Vanna Scotland, MD;  Location: [facility];  Service: Urology;  Laterality: Bilateral;  Vitals:   09/19/16 1350  BP: 127/74  Pulse: 74  SpO2: 99%        Subjective Assessment - 09/19/16 1358    Subjective Patient describes feeling a little sore at sites of bilateral hernias due to sit to stand exercise. Discussion to discontinue weighted sit  to stand exercise understood well. Patient feels like therapy is really helping his legs function better.    Patient is accompained by: Family member   Pertinent History Patient presents with history of kidney stones and a fib. Pateint presents multiple CVAs; first on was in Febuary 2018 and [facility] and reduced eliquis medication. Recovery went well. Second CVA was in May 2018    Limitations Walking   How long can you sit comfortably? unlimited   How long can you stand comfortably? 1 hour   How long can you walk comfortably? 1 hour   Patient Stated Goals Patient wants to be able to paint, outdoor projects, and tolerate longer walks   Currently in Pain? No/denies   Multiple Pain Sites No       Vitals taken before treatment  Treatment:  Nu Step warm up 5 minutes   Squatting technique on airex 15x at // bars. Mirror was used to Education officer, museumenforce proper body mechanics. BOSU 1/2 squats 3 x 10 at // bars. Patient requires occasional UE assist due to losing balance backwards.  Rockerboard sideways taps 20x each at // bars. Good improvement seen here with ability to weight shift onto L LE. Rockerboard fwd/backward taps 20 x each at // bars. Good ability to avoid hips hitting the // bars after moderate cueing to keep center of gravity over center of board. Single leg stance on yellow stone with alternating taps onto BOSU (round side down). This exercise was too challenging due to inability to balance on yellow stone safely and was terminated.  Single leg stance on green theradisc with alternating toe taps onto BOSU (round side down) 2 x10. Patient occasionally loses balance due to inability to fully bear weight when tapping onto BOSU ball. Patient experienced good challenge with this therex. Obstacle course 5 laps sideways orange hurdle -> purple foam -> orange hurdle -> floor -> 1/2 foam roll. Patient displayed good dynamic balance with obstacle course but struggled with 1/2 foam roll.                             PT Education - 09/19/16 1400    Education provided Yes   Education Details Continue to challenge dynamic balance    Person(s) Educated Patient   Methods Explanation   Comprehension Verbalized understanding             PT Long Term Goals - 09/05/16 1556      PT LONG TERM GOAL #1   Title Patient will increase distance of 6 minute walk test to 1570 ft to fall into WNL category for his age group.   Baseline 09/05/16 1180 ft   Time 8   Period Weeks   Status New     PT LONG TERM GOAL #2   Title Patient will tolerate 10 seconds of single leg stance without loss of balance to improve ability to get in and out of shower safely.   Baseline 09/05/16 3 seconds bilaterally   Time 8   Period Weeks   Status New     PT LONG TERM GOAL #3   Title Patient will  be independent in home exercise program to improve strength/mobility for better functional independence with ADLs.   Baseline 09/05/16    Time 8   Period Weeks   Status New               Plan - 09/19/16 1625    Clinical Impression Statement Sit to stand exercise was terminated due to irritation of bilateral inguinal hernias.  Patient tolerated therex well today and did not experience any pain.  Patient displayed good carry-over with rockerboard exercise; he was able to keep center of gravity over center of the board and confidently WB through L LE.  Patient was significantly challenged with single leg stance tapping exercise so exercise was regressed to allow for proper completion.  Patient will continue to benefit from skilled physical therapy to improve dynamic balance and stability of B LE to increase independence.    Rehab Potential Good   PT Frequency 2x / week   PT Duration 8 weeks   PT Treatment/Interventions ADLs/Self Care Home Management;Aquatic Therapy;Cryotherapy;Electrical Stimulation;Moist Heat;Gait training;Stair training;Functional mobility training;Therapeutic  activities;Therapeutic exercise;Balance training;Neuromuscular re-education;Patient/family education;Manual techniques;Passive range of motion;Dry needling;Energy conservation   PT Next Visit Plan Continue challenging dynamic balance and foot clearance ability    PT Home Exercise Plan Walk for exercise everyday   Consulted and Agree with Plan of Care Patient;Family member/caregiver      Patient will benefit from skilled therapeutic intervention in order to improve the following deficits and impairments:  Abnormal gait, Decreased activity tolerance, Decreased balance, Decreased mobility, Decreased coordination, Decreased endurance, Dizziness, Difficulty walking  Visit Diagnosis: Unsteadiness on feet     Problem List Patient Active Problem List   Diagnosis Date Noted  . Dizziness 07/07/2016  . Urolithiasis 03/28/2016  . Pyuria 03/28/2016  . Hydronephrosis 03/27/2016  . Cardiac arrhythmia 07/22/2015  . Atrial fibrillation with RVR (HCC) 02/24/2015  . Cardiac conduction disorder 02/04/2015  . Arthritis 02/04/2015  . Atrial fibrillation (HCC) 02/04/2015  . Acid reflux 02/04/2015  . H/O vertigo 02/04/2015  . HLD (hyperlipidemia) 02/04/2015  . BP (high blood pressure) 02/04/2015  . Calculus of kidney 02/04/2015  . Sick sinus syndrome (HCC) 02/04/2015  . Fungal infection of nail 02/04/2015  . Type 2 diabetes mellitus (HCC) 02/04/2015  . Sepsis (HCC) 12/10/2014  . Systemic infection (HCC) 12/10/2014  . Sepsis(995.91) 12/10/2014  . Altered bowel function 10/13/2014  . Chronic diarrhea 10/13/2014  . Change in bowel habits 10/13/2014  . Senile dementia of Alzheimer's type 09/20/2014  . Alzheimer's dementia, late onset 09/20/2014  . Drug resistance 10/22/2013  This entire session was performed under direct supervision and direction of a licensed therapist/therapist assistant . I have personally read, edited and approve of the note as written. Madilyn HookDouglas Bud Kaeser SPT  Michele McalpineDoug  Yamil Oelke 09/19/2016, 4:31 PM  Ezekiel InaKristine S Mansfield, PT, DPT   [facility] [address] [place], [place], [address] Phone: [phone]   Fax:  [phone]  Name: William PigeonJames B Hench Sr. MRN: [id number] Date of Birth: 10/23/1932

## 2016-09-21 ENCOUNTER — Ambulatory Visit: Payer: Medicare Other | Attending: Internal Medicine | Admitting: Physical Therapy

## 2016-09-21 ENCOUNTER — Encounter: Payer: Self-pay | Admitting: Physical Therapy

## 2016-09-21 DIAGNOSIS — R2681 Unsteadiness on feet: Secondary | ICD-10-CM | POA: Insufficient documentation

## 2016-09-21 NOTE — Therapy (Signed)
[place] [facility] [address] [place], [place], [address] Phone: [phone]   Fax:  [phone]  Physical Therapy Treatment  Patient Details  Name: William Lozano. MRN: [id number] Date of Birth: 03/27/1932 Referring Provider: Aram BeechamSPARKS, JEFFREY D  Encounter Date: 09/21/2016      PT End of Session - 09/21/16 1356    Visit Number 5   Number of Visits 17   Date for PT Re-Evaluation 10/31/16   Authorization Type g codes 5/10   PT Start Time 1345   PT Stop Time 1430   PT Time Calculation (min) 45 min   Equipment Utilized During Treatment Gait belt   Activity Tolerance Patient tolerated treatment well   Behavior During Therapy WFL for tasks assessed/performed      Past Medical History:  Diagnosis Date  . Arthritis   . Atrial fibrillation (HCC) 2016   paroxysmal atrial fibrillation, sick sinus syndrome  . Complication of anesthesia    affects memory for a longer time after  . Dementia    alzheimers  . Essential hypertension   . GERD (gastroesophageal reflux disease)   . History of kidney stones   . Hyperlipemia   . Inguinal hernia recurrent bilateral   . Kidney stones     Past Surgical History:  Procedure Laterality Date  . CYSTOSCOPY W/ RETROGRADES Bilateral 03/27/2016   Procedure: CYSTOSCOPY WITH RETROGRADE PYELOGRAM;  Surgeon: [name], MD;  Location: [facility];  Service: Urology;  Laterality: Bilateral;  . CYSTOSCOPY W/ URETERAL STENT PLACEMENT Bilateral 02/24/2015   Procedure: CYSTOSCOPY WITH STENT REPLACEMENT;  Surgeon: Hildred LaserBrian Monroe Budzyn, MD;  Location: [facility];  Service: Urology;  Laterality: Bilateral;  . CYSTOSCOPY W/ URETERAL STENT PLACEMENT Left 04/11/2016   Procedure: CYSTOSCOPY WITH STENT REPLACEMENT;  Surgeon: [name], MD;  Location: [facility];  Service: Urology;  Laterality: Left;  . CYSTOSCOPY W/ URETERAL STENT REMOVAL Right 04/11/2016   Procedure: CYSTOSCOPY WITH STENT REMOVAL;  Surgeon: Vanna ScotlandAshley  Brandon, MD;  Location: [facility];  Service: Urology;  Laterality: Right;  . CYSTOSCOPY WITH RETROGRADE PYELOGRAM, URETEROSCOPY AND STENT PLACEMENT Bilateral 12/10/2014   Procedure: CYSTOSCOPY WITH RETROGRADE PYELOGRAM, URETEROSCOPY AND STENT PLACEMENT;  Surgeon: Hildred LaserBrian Kashius Budzyn, MD;  Location: [facility];  Service: Urology;  Laterality: Bilateral;  . CYSTOSCOPY WITH STENT PLACEMENT Bilateral 12/10/2014   Procedure: CYSTOSCOPY WITH STENT PLACEMENT;  Surgeon: Hildred LaserBrian Caspian Budzyn, MD;  Location: [facility];  Service: Urology;  Laterality: Bilateral;  . CYSTOSCOPY WITH STENT PLACEMENT Bilateral 03/27/2016   Procedure: CYSTOSCOPY WITH STENT PLACEMENT;  Surgeon: [name], MD;  Location: [facility];  Service: Urology;  Laterality: Bilateral;  . EXTRACORPOREAL SHOCK WAVE LITHOTRIPSY Right 01/07/2015   Procedure: EXTRACORPOREAL SHOCK WAVE LITHOTRIPSY (ESWL);  Surgeon: [name], MD;  Location: [facility];  Service: Urology;  Laterality: Right;  . EXTRACORPOREAL SHOCK WAVE LITHOTRIPSY Left 01/21/2015   Procedure: EXTRACORPOREAL SHOCK WAVE LITHOTRIPSY (ESWL);  Surgeon: Hildred LaserBrian Conal Budzyn, MD;  Location: [facility];  Service: Urology;  Laterality: Left;  . EYE SURGERY Bilateral    Cataract Extraction with IOL  . HERNIA REPAIR Bilateral    inguinal  . URETEROSCOPY WITH HOLMIUM LASER LITHOTRIPSY Bilateral 02/24/2015   Procedure: URETEROSCOPY WITH HOLMIUM LASER LITHOTRIPSY /STONE BASKETING;  Surgeon: Hildred LaserBrian Ames Budzyn, MD;  Location: [facility];  Service: Urology;  Laterality: Bilateral;  . URETEROSCOPY WITH HOLMIUM LASER LITHOTRIPSY Bilateral 04/11/2016   Procedure: URETEROSCOPY WITH HOLMIUM LASER LITHOTRIPSY;  Surgeon: [name], MD;  Location: [facility];  Service: Urology;  Laterality: Bilateral;    There were no vitals filed for this visit.      Subjective Assessment - 09/21/16 1355    Subjective Patient describes soreness of inguinal hernias has decreased and feels good.   Patient is accompained by:  Family member   Pertinent History Patient presents with history of kidney stones and a fib. Pateint presents multiple CVAs; first on was in Febuary 2018 and was admitted to [facility] and reduced eliquis medication. Recovery went well. Second CVA was in May 2018    Limitations Walking   How long can you sit comfortably? unlimited   How long can you stand comfortably? 1 hour   How long can you walk comfortably? 1 hour   Patient Stated Goals Patient wants to be able to paint, outdoor projects, and tolerate longer walks   Currently in Pain? No/denies   Multiple Pain Sites No       Treatment:  Nu Step warm up 5 minutes  Single leg press 60 lbs 2 x 20. Matrix sidesteps 17.5 lbs 4 laps each. Constant cueing to keep feet pointed straight ahead.  Step up taps purple foam to BOSU (rounded side down) 2 x 15 each BOSU ball mini squats 10x. Patient seemed to struggle more with this exercise today -- discontinued after first set Rockerboard sideways taps 20 x each. Patient seems less hesitant with pressing down with L LE Rockerboard fwd/backward taps 20 x each. Patient is hesitant with weight bearing with L LE in front. Ladder 2 in 1 out with cone taps and purple foam. 5 laps. Patient was able to follow directions better this time and displayed less confusion with instructions.                            PT Education - 09/21/16 1356    Education provided Yes   Education Details Continue to challenge dynamic balance   Person(s) Educated Patient   Methods Explanation   Comprehension Verbalized understanding             PT Long Term Goals - 09/05/16 1556      PT LONG TERM GOAL #1   Title Patient will increase distance of 6 minute walk test to 1570 ft to fall into WNL category for his age group.   Baseline 09/05/16 1180 ft   Time 8   Period Weeks   Status New     PT LONG TERM GOAL #2   Title Patient will tolerate 10 seconds of single leg stance without loss of  balance to improve ability to get in and out of shower safely.   Baseline 09/05/16 3 seconds bilaterally   Time 8   Period Weeks   Status New     PT LONG TERM GOAL #3   Title Patient will be independent in home exercise program to improve strength/mobility for better functional independence with ADLs.   Baseline 09/05/16    Time 8   Period Weeks   Status New               Plan - 09/21/16 1400    Clinical Impression Statement Patient responded well to therapy today and did not experience pain at hernia sites.  Patient tolerated new therex well with no increase in pain and minimal-to-no loss of balance. Overall, patient seems anxious about L LE not feeling the same as R LE, but is progressing well with functional movement and  improved dynamic balance. Patient will continue to benefit from skilled physical therapy to continue improving dynamic balance to reduce risk of falls.     Rehab Potential Good   PT Frequency 2x / week   PT Duration 8 weeks   PT Treatment/Interventions ADLs/Self Care Home Management;Aquatic Therapy;Cryotherapy;Electrical Stimulation;Moist Heat;Gait training;Stair training;Functional mobility training;Therapeutic activities;Therapeutic exercise;Balance training;Neuromuscular re-education;Patient/family education;Manual techniques;Passive range of motion;Dry needling;Energy conservation   PT Next Visit Plan Continue challenging dynamic balance    PT Home Exercise Plan Walk for exercise everyday   Consulted and Agree with Plan of Care Patient;Family member/caregiver      Patient will benefit from skilled therapeutic intervention in order to improve the following deficits and impairments:  Abnormal gait, Decreased activity tolerance, Decreased balance, Decreased mobility, Decreased coordination, Decreased endurance, Dizziness, Difficulty walking  Visit Diagnosis: Unsteadiness on feet     Problem List Patient Active Problem List   Diagnosis Date Noted  .  Dizziness 07/07/2016  . Urolithiasis 03/28/2016  . Pyuria 03/28/2016  . Hydronephrosis 03/27/2016  . Cardiac arrhythmia 07/22/2015  . Atrial fibrillation with RVR (HCC) 02/24/2015  . Cardiac conduction disorder 02/04/2015  . Arthritis 02/04/2015  . Atrial fibrillation (HCC) 02/04/2015  . Acid reflux 02/04/2015  . H/O vertigo 02/04/2015  . HLD (hyperlipidemia) 02/04/2015  . BP (high blood pressure) 02/04/2015  . Calculus of kidney 02/04/2015  . Sick sinus syndrome (HCC) 02/04/2015  . Fungal infection of nail 02/04/2015  . Type 2 diabetes mellitus (HCC) 02/04/2015  . Sepsis (HCC) 12/10/2014  . Systemic infection (HCC) 12/10/2014  . Sepsis(995.91) 12/10/2014  . Altered bowel function 10/13/2014  . Chronic diarrhea 10/13/2014  . Change in bowel habits 10/13/2014  . Senile dementia of Alzheimer's type 09/20/2014  . Alzheimer's dementia, late onset 09/20/2014  . Drug resistance 10/22/2013  Ezekiel InaKristine S Mansfield, PT, DPT This entire session was performed under direct supervision and direction of a licensed therapist/therapist assistant . I have personally read, edited and approve of the note as written. Madilyn HookDouglas Andrej Spagnoli SPT  Michele McalpineDoug Tyshia Fenter 09/21/2016, 2:49 PM  [place] [facility] [address] [place], [place], [address] Phone: [phone]   Fax:  [phone]  Name: William PigeonJames B Deyton Lozano. MRN: [id number] Date of Birth: 12/23/1932

## 2016-09-26 ENCOUNTER — Encounter: Payer: Self-pay | Admitting: Physical Therapy

## 2016-09-26 ENCOUNTER — Ambulatory Visit: Payer: Medicare Other | Admitting: Physical Therapy

## 2016-09-26 DIAGNOSIS — R2681 Unsteadiness on feet: Secondary | ICD-10-CM

## 2016-09-26 NOTE — Therapy (Signed)
[facility] [facility] MAIN [facility] SERVICES [address] [place], [place], [address] Phone: [phone]   Fax:  [phone]  Physical Therapy Treatment  Patient Details  Name: GOVANI RADLOFF Sr. MRN: [id number] Date of Birth: January 09, 1933 Referring Provider: Aram Beecham D  Encounter Date: 09/26/2016      PT End of Session - 09/26/16 1305    Visit Number 6   Number of Visits 17   Date for PT Re-Evaluation 11/14/16   Authorization Type g codes 6/10   PT Start Time 1300   PT Stop Time 1345   PT Time Calculation (min) 45 min   Equipment Utilized During Treatment Gait belt   Activity Tolerance Patient tolerated treatment well   Behavior During Therapy WFL for tasks assessed/performed      Past Medical History:  Diagnosis Date  . Arthritis   . Atrial fibrillation (HCC) 2016   paroxysmal atrial fibrillation, sick sinus syndrome  . Complication of anesthesia    affects memory for a longer time after  . Dementia    alzheimers  . Essential hypertension   . GERD (gastroesophageal reflux disease)   . History of kidney stones   . Hyperlipemia   . Inguinal hernia recurrent bilateral   . Kidney stones     Past Surgical History:  Procedure Laterality Date  . CYSTOSCOPY W/ RETROGRADES Bilateral 03/27/2016   Procedure: CYSTOSCOPY WITH RETROGRADE PYELOGRAM;  Surgeon: Vanna Scotland, MD;  Location: [facility];  Service: Urology;  Laterality: Bilateral;  . CYSTOSCOPY W/ URETERAL STENT PLACEMENT Bilateral 02/24/2015   Procedure: CYSTOSCOPY WITH STENT REPLACEMENT;  Surgeon: Hildred Laser, MD;  Location: [facility];  Service: Urology;  Laterality: Bilateral;  . CYSTOSCOPY W/ URETERAL STENT PLACEMENT Left 04/11/2016   Procedure: CYSTOSCOPY WITH STENT REPLACEMENT;  Surgeon: Vanna Scotland, MD;  Location: [facility];  Service: Urology;  Laterality: Left;  . CYSTOSCOPY W/ URETERAL STENT REMOVAL Right 04/11/2016   Procedure: CYSTOSCOPY WITH STENT REMOVAL;  Surgeon: Vanna Scotland, MD;  Location: [facility];  Service: Urology;  Laterality: Right;  . CYSTOSCOPY WITH RETROGRADE PYELOGRAM, URETEROSCOPY AND STENT PLACEMENT Bilateral 12/10/2014   Procedure: CYSTOSCOPY WITH RETROGRADE PYELOGRAM, URETEROSCOPY AND STENT PLACEMENT;  Surgeon: Hildred Laser, MD;  Location: [facility];  Service: Urology;  Laterality: Bilateral;  . CYSTOSCOPY WITH STENT PLACEMENT Bilateral 12/10/2014   Procedure: CYSTOSCOPY WITH STENT PLACEMENT;  Surgeon: Hildred Laser, MD;  Location: [facility];  Service: Urology;  Laterality: Bilateral;  . CYSTOSCOPY WITH STENT PLACEMENT Bilateral 03/27/2016   Procedure: CYSTOSCOPY WITH STENT PLACEMENT;  Surgeon: Vanna Scotland, MD;  Location: [facility];  Service: Urology;  Laterality: Bilateral;  . EXTRACORPOREAL SHOCK WAVE LITHOTRIPSY Right 01/07/2015   Procedure: EXTRACORPOREAL SHOCK WAVE LITHOTRIPSY (ESWL);  Surgeon: Lorraine Lax, MD;  Location: [facility];  Service: Urology;  Laterality: Right;  . EXTRACORPOREAL SHOCK WAVE LITHOTRIPSY Left 01/21/2015   Procedure: EXTRACORPOREAL SHOCK WAVE LITHOTRIPSY (ESWL);  Surgeon: Hildred Laser, MD;  Location: [facility];  Service: Urology;  Laterality: Left;  . EYE SURGERY Bilateral    Cataract Extraction with IOL  . HERNIA REPAIR Bilateral    inguinal  . URETEROSCOPY WITH HOLMIUM LASER LITHOTRIPSY Bilateral 02/24/2015   Procedure: URETEROSCOPY WITH HOLMIUM LASER LITHOTRIPSY /STONE BASKETING;  Surgeon: Hildred Laser, MD;  Location: [facility];  Service: Urology;  Laterality: Bilateral;  . URETEROSCOPY WITH HOLMIUM LASER LITHOTRIPSY Bilateral 04/11/2016   Procedure: URETEROSCOPY WITH HOLMIUM LASER LITHOTRIPSY;  Surgeon: Vanna Scotland, MD;  Location: [facility];  Service: Urology;  Laterality: Bilateral;    There were no vitals filed for this visit.      Subjective Assessment - 09/26/16 1304    Subjective Patient describes feeling much better; his legs and energy level have gotten much better.  He has noticed  that he feels more confident with standing activities.   Pertinent History Patient presents with history of kidney stones and a fib. Pateint presents multiple CVAs; first on was in Febuary 2018 and [facility] and reduced eliquis medication. Recovery went well. Second CVA was in May 2018    How long can you sit comfortably? unlimited   How long can you stand comfortably? 1 hour   How long can you walk comfortably? 1 hour   Patient Stated Goals Patient wants to be able to paint, outdoor projects, and tolerate longer walks   Currently in Pain? No/denies   Multiple Pain Sites No       Treatment:  Nu Step warm up 5 minutes  Single leg press 75 lbs 2 x 15 each. Patient demonstrates good control and does not need cueing for proper joint alignment. L LE fatigues quicker than R. Sit to stands blue chair 2 x 20. Improvement noted with breathing technique during exercise. Minimal fatigue at end of set. Matrix sidesteps 17.5 lbs each. Patient needs constant VCs to keep feet pointed straight ahead to challenge hip abduction. Mild LOB when facing door of clinic  Tandem balance with head turns 2 x 1 minute. More trouble with balance when L LE is behind.  Rockerboard taps forward/backward 30 x each. Good control here with minimal UE assist for balance. Rockerboard taps sideways 30 x each. Mild LOB that lessoned with repetitions. Patient seems more confident with tapping on the L side. Ladder 2 in 1 out with cone taps 5 laps. Patient had trouble with following directions with this exercise. No LOB.                            PT Education - 09/26/16 1305    Education provided Yes   Education Details Cintinue POC to improve dynamic balance   Person(s) Educated Patient   Methods Explanation   Comprehension Verbalized understanding             PT Long Term Goals - 09/05/16 1556      PT LONG TERM GOAL #1   Title Patient will increase distance of 6 minute  walk test to 1570 ft to fall into WNL category for his age group.   Baseline 09/05/16 1180 ft   Time 8   Period Weeks   Status New     PT LONG TERM GOAL #2   Title Patient will tolerate 10 seconds of single leg stance without loss of balance to improve ability to get in and out of shower safely.   Baseline 09/05/16 3 seconds bilaterally   Time 8   Period Weeks   Status New     PT LONG TERM GOAL #3   Title Patient will be independent in home exercise program to improve strength/mobility for better functional independence with ADLs.   Baseline 09/05/16    Time 8   Period Weeks   Status New               Plan - 09/26/16 1312    Clinical Impression Statement Patient had more energy today and tolerated treatment session well without difficulty.  Patient demonstrates improvement with weight bearing on L LE and is able to balance well during neuro-reducation exercises. Patient still expresses concern about L LE not performing as well as the R LE but he has noticed that he feels more confident with standing activities. Patient will continue to benefit from skilled physical therapy to improve dynamic balance and reduce fall risk.    History and Personal Factors relevant to plan of care: `   Rehab Potential Good   PT Frequency 2x / week   PT Duration 8 weeks   PT Treatment/Interventions ADLs/Self Care Home Management;Aquatic Therapy;Cryotherapy;Electrical Stimulation;Moist Heat;Gait training;Stair training;Functional mobility training;Therapeutic activities;Therapeutic exercise;Balance training;Neuromuscular re-education;Patient/family education;Manual techniques;Passive range of motion;Dry needling;Energy conservation   PT Next Visit Plan Continue challenging dynamic balance    PT Home Exercise Plan Walk for exercise everyday   Consulted and Agree with Plan of Care Patient;Family member/caregiver      Patient will benefit from skilled therapeutic intervention in order to improve the  following deficits and impairments:  Abnormal gait, Decreased activity tolerance, Decreased balance, Decreased mobility, Decreased coordination, Decreased endurance, Dizziness, Difficulty walking  Visit Diagnosis: Unsteadiness on feet     Problem List Patient Active Problem List   Diagnosis Date Noted  . Dizziness 07/07/2016  . Urolithiasis 03/28/2016  . Pyuria 03/28/2016  . Hydronephrosis 03/27/2016  . Cardiac arrhythmia 07/22/2015  . Atrial fibrillation with RVR (HCC) 02/24/2015  . Cardiac conduction disorder 02/04/2015  . Arthritis 02/04/2015  . Atrial fibrillation (HCC) 02/04/2015  . Acid reflux 02/04/2015  . H/O vertigo 02/04/2015  . HLD (hyperlipidemia) 02/04/2015  . BP (high blood pressure) 02/04/2015  . Calculus of kidney 02/04/2015  . Sick sinus syndrome (HCC) 02/04/2015  . Fungal infection of nail 02/04/2015  . Type 2 diabetes mellitus (HCC) 02/04/2015  . Sepsis (HCC) 12/10/2014  . Systemic infection (HCC) 12/10/2014  . Sepsis(995.91) 12/10/2014  . Altered bowel function 10/13/2014  . Chronic diarrhea 10/13/2014  . Change in bowel habits 10/13/2014  . Senile dementia of Alzheimer's type 09/20/2014  . Alzheimer's dementia, late onset 09/20/2014  . Drug resistance 10/22/2013   This entire session was performed under direct supervision and direction of a licensed therapist/therapist assistant . I have personally read, edited and approve of the note as written. [address]  Ezekiel Ina, [place], DPT 09/26/2016, 1:57 PM  [place] [facility] MAIN [facility] SERVICES [address] [place], [place], [address] Phone: [phone]   Fax:  [phone]  Name: RIGHTEOUS CLAIBORNE Sr. MRN: [id number] Date of Birth: 1932/05/29

## 2016-09-28 ENCOUNTER — Ambulatory Visit: Payer: Medicare Other | Admitting: Physical Therapy

## 2016-09-28 ENCOUNTER — Encounter: Payer: Self-pay | Admitting: Physical Therapy

## 2016-09-28 ENCOUNTER — Ambulatory Visit: Payer: Medicare Other

## 2016-09-28 VITALS — BP 110/71 | HR 71

## 2016-09-28 DIAGNOSIS — R2681 Unsteadiness on feet: Secondary | ICD-10-CM | POA: Diagnosis not present

## 2016-09-28 NOTE — Therapy (Signed)
[facility] [facility] MAIN [facility] SERVICES [address] [place], [place], [address] Phone: [phone]   Fax:  [phone]  Physical Therapy Treatment  Patient Details  Name: William Lozano Sr. MRN: [id number] Date of Birth: 1932/08/15 Referring Provider: Aram Beecham D  Encounter Date: 09/28/2016      PT End of Session - 09/28/16 1347    Visit Number 7   Number of Visits 17   Date for PT Re-Evaluation 11/12/16   Authorization Type g codes 7/10   PT Start Time 1345   PT Stop Time 1430   PT Time Calculation (min) 45 min   Equipment Utilized During Treatment Gait belt   Activity Tolerance Patient tolerated treatment well   Behavior During Therapy WFL for tasks assessed/performed      Past Medical History:  Diagnosis Date  . Arthritis   . Atrial fibrillation (HCC) 2016   paroxysmal atrial fibrillation, sick sinus syndrome  . Complication of anesthesia    affects memory for a longer time after  . Dementia    alzheimers  . Essential hypertension   . GERD (gastroesophageal reflux disease)   . History of kidney stones   . Hyperlipemia   . Inguinal hernia recurrent bilateral   . Kidney stones     Past Surgical History:  Procedure Laterality Date  . CYSTOSCOPY W/ RETROGRADES Bilateral 03/27/2016   Procedure: CYSTOSCOPY WITH RETROGRADE PYELOGRAM;  Surgeon: Vanna Scotland, MD;  Location: [facility];  Service: Urology;  Laterality: Bilateral;  . CYSTOSCOPY W/ URETERAL STENT PLACEMENT Bilateral 02/24/2015   Procedure: CYSTOSCOPY WITH STENT REPLACEMENT;  Surgeon: Hildred Laser, MD;  Location: [facility];  Service: Urology;  Laterality: Bilateral;  . CYSTOSCOPY W/ URETERAL STENT PLACEMENT Left 04/11/2016   Procedure: CYSTOSCOPY WITH STENT REPLACEMENT;  Surgeon: Vanna Scotland, MD;  Location: [facility];  Service: Urology;  Laterality: Left;  . CYSTOSCOPY W/ URETERAL STENT REMOVAL Right 04/11/2016   Procedure: CYSTOSCOPY WITH STENT REMOVAL;  Surgeon: Vanna Scotland, MD;  Location: [facility];  Service: Urology;  Laterality: Right;  . CYSTOSCOPY WITH RETROGRADE PYELOGRAM, URETEROSCOPY AND STENT PLACEMENT Bilateral 12/10/2014   Procedure: CYSTOSCOPY WITH RETROGRADE PYELOGRAM, URETEROSCOPY AND STENT PLACEMENT;  Surgeon: Hildred Laser, MD;  Location: [facility];  Service: Urology;  Laterality: Bilateral;  . CYSTOSCOPY WITH STENT PLACEMENT Bilateral 12/10/2014   Procedure: CYSTOSCOPY WITH STENT PLACEMENT;  Surgeon: Hildred Laser, MD;  Location: [facility];  Service: Urology;  Laterality: Bilateral;  . CYSTOSCOPY WITH STENT PLACEMENT Bilateral 03/27/2016   Procedure: CYSTOSCOPY WITH STENT PLACEMENT;  Surgeon: Vanna Scotland, MD;  Location: [facility];  Service: Urology;  Laterality: Bilateral;  . EXTRACORPOREAL SHOCK WAVE LITHOTRIPSY Right 01/07/2015   Procedure: EXTRACORPOREAL SHOCK WAVE LITHOTRIPSY (ESWL);  Surgeon: Lorraine Lax, MD;  Location: [facility];  Service: Urology;  Laterality: Right;  . EXTRACORPOREAL SHOCK WAVE LITHOTRIPSY Left 01/21/2015   Procedure: EXTRACORPOREAL SHOCK WAVE LITHOTRIPSY (ESWL);  Surgeon: Hildred Laser, MD;  Location: [facility];  Service: Urology;  Laterality: Left;  . EYE SURGERY Bilateral    Cataract Extraction with IOL  . HERNIA REPAIR Bilateral    inguinal  . URETEROSCOPY WITH HOLMIUM LASER LITHOTRIPSY Bilateral 02/24/2015   Procedure: URETEROSCOPY WITH HOLMIUM LASER LITHOTRIPSY /STONE BASKETING;  Surgeon: Hildred Laser, MD;  Location: [facility];  Service: Urology;  Laterality: Bilateral;  . URETEROSCOPY WITH HOLMIUM LASER LITHOTRIPSY Bilateral 04/11/2016   Procedure: URETEROSCOPY WITH HOLMIUM LASER LITHOTRIPSY;  Surgeon: Vanna Scotland, MD;  Location: [facility];  Service: Urology;  Laterality: Bilateral;    Vitals:   09/28/16 1344  BP: 110/71  Pulse: 71  SpO2: 98%        Subjective Assessment - 09/28/16 1345    Subjective Patient says he feels fine after working in the yard this morning.    Patient is  accompained by: Family member   Pertinent History Patient presents with history of kidney stones and a fib. Pateint presents multiple CVAs; first on was in Febuary 2018 and was admitted to [facility] and reduced eliquis medication. Recovery went well. Second CVA was in May 2018    Limitations Walking   How long can you sit comfortably? unlimited   How long can you stand comfortably? 1 hour   How long can you walk comfortably? 1 hour   Patient Stated Goals Patient wants to be able to paint, outdoor projects, and tolerate longer walks   Currently in Pain? No/denies   Multiple Pain Sites No        Treatment:  Nu Step warm up 5 minute  There-ex Single leg press 75 lbs 3 x 15. Patient performed this exercise with no cueing; L LE fatigues quicker that R LE but improvement seen with endurance. Patient tolerated increased repetitions well without rest breaks.   Sit to stand with BTB from blue chair 2 x 20. Patient is able to keep tension in BTB during the exercise to activate glutes. Improvement seen in endurance; patient is able to barely touch the seat and stand up.   Clams BTB around 10 x 10 seconds. Patient displays minimal ROM when doing this exercise but has good pelvic and trunk control. Matrix sling weighted side steps 17.5 lbs 5 laps each. Patient tolerated increased laps well and felt mild fatigue at end of set. Constant cueing to keep feet pointed straight.  Neuro Re-ed Single leg balance at // bars 1 x 2 minutes each. Small ball placed under elevated foot for increased stability and instructed to not put too much pressure onto ball to challenge balance; frequent and mild LOB Side step ups to 6 inch at // bars 20 x each. Patient completed this without UE assist or LOB. Minimal cues to keep feet pointed straight ahead.  Tandem on purple foam at // bars with head turns 1 minute each. No LOB.                          PT Education - 09/28/16 1346    Education  provided Yes   Education Details Continue POC to challenge dynamic ballance in multiple plains of movement   Person(s) Educated Patient   Methods Explanation   Comprehension Verbalized understanding             PT Long Term Goals - 09/05/16 1556      PT LONG TERM GOAL #1   Title Patient will increase distance of 6 minute walk test to 1570 ft to fall into WNL category for his age group.   Baseline 09/05/16 1180 ft   Time 8   Period Weeks   Status New     PT LONG TERM GOAL #2   Title Patient will tolerate 10 seconds of single leg stance without loss of balance to improve ability to get in and out of shower safely.   Baseline 09/05/16 3 seconds bilaterally   Time 8   Period Weeks   Status New     PT LONG TERM GOAL #3  Title Patient will be independent in home exercise program to improve strength/mobility for better functional independence with ADLs.   Baseline 09/05/16    Time 8   Period Weeks   Status New               Plan - 09/28/16 1440    Clinical Impression Statement Patient agreed to increase repetitions due to progressing well in therapy and tolerated it well.  Constant cueing is necessary to minimize TFL activation with sidestepping therex; this was further addressed with therex to correctly activate glute med.  Mild improvement noted with balance exercises and patient is showing less hesitation with weight bearing on L LE. Overall, patient is progressing well in therapy. Patient will continue to benefit from skilled physical therapy to improve dynamic balance and endurance to increase independence.    Rehab Potential Good   PT Frequency 2x / week   PT Duration 8 weeks   PT Treatment/Interventions ADLs/Self Care Home Management;Aquatic Therapy;Cryotherapy;Electrical Stimulation;Moist Heat;Gait training;Stair training;Functional mobility training;Therapeutic activities;Therapeutic exercise;Balance training;Neuromuscular re-education;Patient/family education;Manual  techniques;Passive range of motion;Dry needling;Energy conservation   PT Next Visit Plan Continue challenging dynamic balance    PT Home Exercise Plan Walk for exercise everyday   Consulted and Agree with Plan of Care Patient;Family member/caregiver      Patient will benefit from skilled therapeutic intervention in order to improve the following deficits and impairments:  Abnormal gait, Decreased activity tolerance, Decreased balance, Decreased mobility, Decreased coordination, Decreased endurance, Dizziness, Difficulty walking  Visit Diagnosis: Unsteadiness on feet     Problem List Patient Active Problem List   Diagnosis Date Noted  . Dizziness 07/07/2016  . Urolithiasis 03/28/2016  . Pyuria 03/28/2016  . Hydronephrosis 03/27/2016  . Cardiac arrhythmia 07/22/2015  . Atrial fibrillation with RVR (HCC) 02/24/2015  . Cardiac conduction disorder 02/04/2015  . Arthritis 02/04/2015  . Atrial fibrillation (HCC) 02/04/2015  . Acid reflux 02/04/2015  . H/O vertigo 02/04/2015  . HLD (hyperlipidemia) 02/04/2015  . BP (high blood pressure) 02/04/2015  . Calculus of kidney 02/04/2015  . Sick sinus syndrome (HCC) 02/04/2015  . Fungal infection of nail 02/04/2015  . Type 2 diabetes mellitus (HCC) 02/04/2015  . Sepsis (HCC) 12/10/2014  . Systemic infection (HCC) 12/10/2014  . Sepsis(995.91) 12/10/2014  . Altered bowel function 10/13/2014  . Chronic diarrhea 10/13/2014  . Change in bowel habits 10/13/2014  . Senile dementia of Alzheimer's type 09/20/2014  . Alzheimer's dementia, late onset 09/20/2014  . Drug resistance 10/22/2013  This entire session was performed under direct supervision and direction of a licensed therapist/therapist assistant . I have personally read, edited and approve of the note as written. [facility] Taunia Frasco [address]PT  [place], [place] S , [place] DPT 09/28/2016, 4:31 PM  [place] [facility] MAIN [facility] [address] [place],  [place], [id number] Phone: [phone]   Fax:  [phone]  Name: Sheilah PigeonJames B Tunison Sr. MRN: [id number] Date of Birth: 09/28/1932

## 2016-10-03 ENCOUNTER — Ambulatory Visit: Payer: Medicare Other | Admitting: Physical Therapy

## 2016-10-03 ENCOUNTER — Encounter: Payer: Self-pay | Admitting: Physical Therapy

## 2016-10-03 DIAGNOSIS — R2681 Unsteadiness on feet: Secondary | ICD-10-CM | POA: Diagnosis not present

## 2016-10-03 NOTE — Therapy (Addendum)
[facility] [address] [place], [place], [address] Phone: [phone]   Fax:  [phone]  Physical Therapy Treatment  Patient Details  Name: William PigeonJames B Champagne Sr. MRN: [id number] Date of Birth: 02/12/1933 Referring Provider: Aram BeechamSPARKS, JEFFREY D  Encounter Date: 10/03/2016      PT End of Session - 10/03/16 1335    Visit Number 8   Number of Visits 17   Date for PT Re-Evaluation 10/31/16   Authorization Type g codes 8/10   PT Start Time 1330   PT Stop Time 1415   PT Time Calculation (min) 45 min   Equipment Utilized During Treatment Gait belt   Activity Tolerance Patient tolerated treatment well   Behavior During Therapy WFL for tasks assessed/performed      Past Medical History:  Diagnosis Date  . Arthritis   . Atrial fibrillation (HCC) 2016   paroxysmal atrial fibrillation, sick sinus syndrome  . Complication of anesthesia    affects memory for a longer time after  . Dementia    alzheimers  . Essential hypertension   . GERD (gastroesophageal reflux disease)   . History of kidney stones   . Hyperlipemia   . Inguinal hernia recurrent bilateral   . Kidney stones     Past Surgical History:  Procedure Laterality Date  . CYSTOSCOPY W/ RETROGRADES Bilateral 03/27/2016   Procedure: CYSTOSCOPY WITH RETROGRADE PYELOGRAM;  Surgeon: [name], MD;  Location: [facility];  Service: Urology;  Laterality: Bilateral;  . CYSTOSCOPY W/ URETERAL STENT PLACEMENT Bilateral 02/24/2015   Procedure: CYSTOSCOPY WITH STENT REPLACEMENT;  Surgeon: Hildred LaserBrian Kinneth Budzyn, MD;  Location: [facility];  Service: Urology;  Laterality: Bilateral;  . CYSTOSCOPY W/ URETERAL STENT PLACEMENT Left 04/11/2016   Procedure: CYSTOSCOPY WITH STENT REPLACEMENT;  Surgeon: [name], MD;  Location: [facility];  Service: Urology;  Laterality: Left;  . CYSTOSCOPY W/ URETERAL STENT REMOVAL Right 04/11/2016   Procedure: CYSTOSCOPY WITH STENT REMOVAL;  Surgeon: Vanna ScotlandAshley  Brandon, MD;  Location: [facility];  Service: Urology;  Laterality: Right;  . CYSTOSCOPY WITH RETROGRADE PYELOGRAM, URETEROSCOPY AND STENT PLACEMENT Bilateral 12/10/2014   Procedure: CYSTOSCOPY WITH RETROGRADE PYELOGRAM, URETEROSCOPY AND STENT PLACEMENT;  Surgeon: Hildred LaserBrian Earlin Budzyn, MD;  Location: [facility];  Service: Urology;  Laterality: Bilateral;  . CYSTOSCOPY WITH STENT PLACEMENT Bilateral 12/10/2014   Procedure: CYSTOSCOPY WITH STENT PLACEMENT;  Surgeon: Hildred LaserBrian Eden Budzyn, MD;  Location: [facility];  Service: Urology;  Laterality: Bilateral;  . CYSTOSCOPY WITH STENT PLACEMENT Bilateral 03/27/2016   Procedure: CYSTOSCOPY WITH STENT PLACEMENT;  Surgeon: [name], MD;  Location: [facility];  Service: Urology;  Laterality: Bilateral;  . EXTRACORPOREAL SHOCK WAVE LITHOTRIPSY Right 01/07/2015   Procedure: EXTRACORPOREAL SHOCK WAVE LITHOTRIPSY (ESWL);  Surgeon: [name], MD;  Location: [facility];  Service: Urology;  Laterality: Right;  . EXTRACORPOREAL SHOCK WAVE LITHOTRIPSY Left 01/21/2015   Procedure: EXTRACORPOREAL SHOCK WAVE LITHOTRIPSY (ESWL);  Surgeon: Hildred LaserBrian Gabino Budzyn, MD;  Location: [facility];  Service: Urology;  Laterality: Left;  . EYE SURGERY Bilateral    Cataract Extraction with IOL  . HERNIA REPAIR Bilateral    inguinal  . URETEROSCOPY WITH HOLMIUM LASER LITHOTRIPSY Bilateral 02/24/2015   Procedure: URETEROSCOPY WITH HOLMIUM LASER LITHOTRIPSY /STONE BASKETING;  Surgeon: Hildred LaserBrian Amel Budzyn, MD;  Location: [facility];  Service: Urology;  Laterality: Bilateral;  . URETEROSCOPY WITH HOLMIUM LASER LITHOTRIPSY Bilateral 04/11/2016   Procedure: URETEROSCOPY WITH HOLMIUM LASER LITHOTRIPSY;  Surgeon: [name], MD;  Location: [facility];  Service: Urology;  Laterality: Bilateral;    There were no vitals filed for this visit.      Subjective Assessment - 10/03/16 1335    Subjective Patient is doing well today and has nothing to report.   Pertinent History Patient presents with history  of kidney stones and a fib. Pateint presents multiple CVAs; first on was in Febuary 2018 and was admitted to [facility] and reduced eliquis medication. Recovery went well. Second CVA was in May 2018    Limitations Walking   How long can you sit comfortably? unlimited   How long can you stand comfortably? 1 hour   How long can you walk comfortably? 1 hour   Patient Stated Goals Patient wants to be able to paint, outdoor projects, and tolerate longer walks   Currently in Pain? No/denies   Multiple Pain Sites No        Treatment:  Neuromuscular Re-education Treadmill gait training 5 minutes at .8 mph for increased stride length and foot clearance ability. Tandem balance on purple foam with head turns at // bars 2 x 1 minute each. Rare LOB here when cued to turn head slowly.  There-ex Ascending stairs 2 x 5 laps with 4 lbs ankle weights on. Patient is able to skip a step with step-over-pattern without UE assist with good speed.  Obstacle course 8 laps forward and 8 laps sideways: orange hurdle -> floor -> orange hurdle with 4 lbs ankle weights on. Patient struggled in beginning but improved foot clearing ability with practice. Matrix sling weighted sidesteps 17.5 lbs 4 laps each. Patient instructed to quicken pace to challenge power and dynamic balance. Minimal-to-no LOB of here  Single leg press 60 lbs 20 x each. Patient experienced slight B knee pain; this is new. Foot position corrected and knee pain was abolished Double leg press 110 lbs 2 x 15. Emphasis on speed during concentric phase to challenge power.  Moderate VCs to minimize B knee extension.  Sit to stands from blue chair with GTB around knees 30x. Patient tolerated this exercise well and is able to perform with arms across chest.                        PT Education - 10/03/16 1335    Education provided Yes   Education Details Continue to challenge dynamic balance   Person(s) Educated Patient   Methods  Explanation   Comprehension Verbalized understanding             PT Long Term Goals - 10/03/16 1707      PT LONG TERM GOAL #1   Title Patient will increase distance of 6 minute walk test to 1570 ft to fall into WNL category for his age group.   Baseline 09/05/16 1180 ft   Time 8   Period Weeks   Status On-going     PT LONG TERM GOAL #2   Title Patient will tolerate 10 seconds of single leg stance without loss of balance to improve ability to get in and out of shower safely.   Baseline 09/05/16 3 seconds bilaterally   Time 8   Period Weeks   Status On-going     PT LONG TERM GOAL #3   Title Patient will be independent in home exercise program to improve strength/mobility for better functional independence with ADLs.   Baseline 09/05/16    Time 8   Period Weeks   Status On-going  Plan - 10/03/16 1421    Clinical Impression Statement Therapy went well today and patient described feeling more energetic at end of session. Patient tolerated therex that emphasized power and foot clearance well today with minimal LOB.  Patient complained of bilateral knee pain for the first time during B LE strengthening exercises that was abolished with re-education of foot positioning and correct technique. Overall, patient continues to perform well in therapy. Patient will continue to benefit from skilled physical therapy to improve dynamic balance to increase independence.    Rehab Potential Good   PT Frequency 2x / week   PT Duration 8 weeks   PT Treatment/Interventions ADLs/Self Care Home Management;Aquatic Therapy;Cryotherapy;Electrical Stimulation;Moist Heat;Gait training;Stair training;Functional mobility training;Therapeutic activities;Therapeutic exercise;Balance training;Neuromuscular re-education;Patient/family education;Manual techniques;Passive range of motion;Dry needling;Energy conservation   PT Next Visit Plan Continue challenging dynamic balance    PT Home Exercise  Plan Walk for exercise everyday   Consulted and Agree with Plan of Care Patient;Family member/caregiver      Patient will benefit from skilled therapeutic intervention in order to improve the following deficits and impairments:  Abnormal gait, Decreased activity tolerance, Decreased balance, Decreased mobility, Decreased coordination, Decreased endurance, Dizziness, Difficulty walking  Visit Diagnosis: Unsteadiness on feet     Problem List Patient Active Problem List   Diagnosis Date Noted  . Dizziness 07/07/2016  . Urolithiasis 03/28/2016  . Pyuria 03/28/2016  . Hydronephrosis 03/27/2016  . Cardiac arrhythmia 07/22/2015  . Atrial fibrillation with RVR (HCC) 02/24/2015  . Cardiac conduction disorder 02/04/2015  . Arthritis 02/04/2015  . Atrial fibrillation (HCC) 02/04/2015  . Acid reflux 02/04/2015  . H/O vertigo 02/04/2015  . HLD (hyperlipidemia) 02/04/2015  . BP (high blood pressure) 02/04/2015  . Calculus of kidney 02/04/2015  . Sick sinus syndrome (HCC) 02/04/2015  . Fungal infection of nail 02/04/2015  . Type 2 diabetes mellitus (HCC) 02/04/2015  . Sepsis (HCC) 12/10/2014  . Systemic infection (HCC) 12/10/2014  . Sepsis(995.91) 12/10/2014  . Altered bowel function 10/13/2014  . Chronic diarrhea 10/13/2014  . Change in bowel habits 10/13/2014  . Senile dementia of Alzheimer's type 09/20/2014  . Alzheimer's dementia, late onset 09/20/2014  . Drug resistance 10/22/2013  This entire session was performed under direct supervision and direction of a licensed therapist/therapist assistant . I have personally read, edited and approve of the note as written. [facility] [address] [place], [place] DPT 10/03/2016, 5:10 PM  [facility] MAIN [facility] SERVICES [address] [place], [place], [address] Phone: [phone]   Fax:  [phone]  Name: William FILIPPINI Sr. MRN: [id number] Date of Birth: March 10, 1932

## 2016-10-05 ENCOUNTER — Encounter: Payer: Self-pay | Admitting: Physical Therapy

## 2016-10-05 ENCOUNTER — Ambulatory Visit: Payer: Medicare Other

## 2016-10-05 VITALS — BP 131/68 | HR 89

## 2016-10-05 DIAGNOSIS — R2681 Unsteadiness on feet: Secondary | ICD-10-CM | POA: Diagnosis not present

## 2016-10-05 NOTE — Therapy (Signed)
[facility] MAIN [facility] SERVICES [address] [place], [place], [address] Phone: [phone]   Fax:  [phone]  Physical Therapy Treatment  Patient Details  Name: William CHAMPLAIN Sr. MRN: [id number] Date of Birth: 10/14/1932 Referring Provider: Aram Beecham D  Encounter Date: 10/05/2016      PT End of Session - 10/05/16 1304    Visit Number 9   Number of Visits 17   Date for PT Re-Evaluation 11-20-2016   Authorization Type g codes 2022-11-20   PT Start Time 1300   PT Stop Time 1345   PT Time Calculation (min) 45 min   Equipment Utilized During Treatment Gait belt   Activity Tolerance Patient tolerated treatment well   Behavior During Therapy WFL for tasks assessed/performed      Past Medical History:  Diagnosis Date  . Arthritis   . Atrial fibrillation (HCC) 2016   paroxysmal atrial fibrillation, sick sinus syndrome  . Complication of anesthesia    affects memory for a longer time after  . Dementia    alzheimers  . Essential hypertension   . GERD (gastroesophageal reflux disease)   . History of kidney stones   . Hyperlipemia   . Inguinal hernia recurrent bilateral   . Kidney stones     Past Surgical History:  Procedure Laterality Date  . CYSTOSCOPY W/ RETROGRADES Bilateral 03/27/2016   Procedure: CYSTOSCOPY WITH RETROGRADE PYELOGRAM;  Surgeon: Vanna Scotland, MD;  Location: [facility];  Service: Urology;  Laterality: Bilateral;  . CYSTOSCOPY W/ URETERAL STENT PLACEMENT Bilateral 02/24/2015   Procedure: CYSTOSCOPY WITH STENT REPLACEMENT;  Surgeon: Hildred Laser, MD;  Location: [facility];  Service: Urology;  Laterality: Bilateral;  . CYSTOSCOPY W/ URETERAL STENT PLACEMENT Left 04/11/2016   Procedure: CYSTOSCOPY WITH STENT REPLACEMENT;  Surgeon: Vanna Scotland, MD;  Location: [facility];  Service: Urology;  Laterality: Left;  . CYSTOSCOPY W/ URETERAL STENT REMOVAL Right 04/11/2016   Procedure: CYSTOSCOPY WITH STENT REMOVAL;  Surgeon: Vanna Scotland, MD;  Location: [facility];  Service: Urology;  Laterality: Right;  . CYSTOSCOPY WITH RETROGRADE PYELOGRAM, URETEROSCOPY AND STENT PLACEMENT Bilateral 12/10/2014   Procedure: CYSTOSCOPY WITH RETROGRADE PYELOGRAM, URETEROSCOPY AND STENT PLACEMENT;  Surgeon: Hildred Laser, MD;  Location: [facility];  Service: Urology;  Laterality: Bilateral;  . CYSTOSCOPY WITH STENT PLACEMENT Bilateral 12/10/2014   Procedure: CYSTOSCOPY WITH STENT PLACEMENT;  Surgeon: Hildred Laser, MD;  Location: [facility];  Service: Urology;  Laterality: Bilateral;  . CYSTOSCOPY WITH STENT PLACEMENT Bilateral 03/27/2016   Procedure: CYSTOSCOPY WITH STENT PLACEMENT;  Surgeon: Vanna Scotland, MD;  Location: [facility];  Service: Urology;  Laterality: Bilateral;  . EXTRACORPOREAL SHOCK WAVE LITHOTRIPSY Right 01/07/2015   Procedure: EXTRACORPOREAL SHOCK WAVE LITHOTRIPSY (ESWL);  Surgeon: Lorraine Lax, MD;  Location: [facility];  Service: Urology;  Laterality: Right;  . EXTRACORPOREAL SHOCK WAVE LITHOTRIPSY Left 01/21/2015   Procedure: EXTRACORPOREAL SHOCK WAVE LITHOTRIPSY (ESWL);  Surgeon: Hildred Laser, MD;  Location: [facility];  Service: Urology;  Laterality: Left;  . EYE SURGERY Bilateral    Cataract Extraction with IOL  . HERNIA REPAIR Bilateral    inguinal  . URETEROSCOPY WITH HOLMIUM LASER LITHOTRIPSY Bilateral 02/24/2015   Procedure: URETEROSCOPY WITH HOLMIUM LASER LITHOTRIPSY /STONE BASKETING;  Surgeon: Hildred Laser, MD;  Location: [facility];  Service: Urology;  Laterality: Bilateral;  . URETEROSCOPY WITH HOLMIUM LASER LITHOTRIPSY Bilateral 04/11/2016   Procedure: URETEROSCOPY WITH HOLMIUM LASER LITHOTRIPSY;  Surgeon: Vanna Scotland, MD;  Location: [facility];  Service: Urology;  Laterality: Bilateral;    Vitals:   10/05/16 1309  BP: 131/68  Pulse: 89  SpO2: 98%        Subjective Assessment - 10/05/16 1303    Subjective Patient is doing well today and does not have any new complaints   Patient is  accompained by: Family member   Pertinent History Patient presents with history of kidney stones and a fib. Pateint presents multiple CVAs; first on was in Febuary 2018 and was admitted to [facility] and reduced eliquis medication. Recovery went well. Second CVA was in May 2018    Limitations Walking   How long can you sit comfortably? unlimited   How long can you stand comfortably? 1 hour   How long can you walk comfortably? 1 hour   Patient Stated Goals Patient wants to be able to paint, outdoor projects, and tolerate longer walks   Currently in Pain? No/denies   Multiple Pain Sites No        Treatment:  Treadmill warm up 5 minutes 1.1 mph 5% incline  Ther-ex Double leg press 120 lbs 3 x 15. Patient instructed on increasing speed during concentric phase of press to improve power.   Sit to stands with GTB around knees from blue chair 30x. Minimal cueing necessary for this exercise; minimal-to-moderate fatigue here. TA dynamic isometrics in hooklying with GTB 1 x 45 seconds each. PT assist to provide pulling force. Patient instructed to keep arms directly over sternum to dynamically engage core. Medicine ball toss on purple foam narrow stance 20 tosses. Yellow medicine ball was used for tossing. Patient observed to be steady and was able to engage core to increase stability.   Stairs 2 x 5. Patient is able to ascend by skipping one step with alternating pattern without using UE assist. No LOB here.   Neuromuscular Re-education Obstacle Course: orange hurdle -> purple foam -> floor -> 6 inch -> floor, orange hurdle. CGA for safety. Patient demonstrates good stability when single leg balance is challenged.  - 4 lbs ankle weights   - forwards and sideways 8 laps each Single leg balance with ball assist 2 x 45 seconds each. Patient experienced minimal-to-moderate LOB; this seems like a good challenge for him.                       PT Education - 10/05/16 1304     Education provided Yes   Education Details Continue to challenge dynamic balance   Person(s) Educated Patient   Methods Explanation   Comprehension Verbalized understanding             PT Long Term Goals - 10/03/16 1707      PT LONG TERM GOAL #1   Title Patient will increase distance of 6 minute walk test to 1570 ft to fall into WNL category for his age group.   Baseline 09/05/16 1180 ft   Time 8   Period Weeks   Status On-going     PT LONG TERM GOAL #2   Title Patient will tolerate 10 seconds of single leg stance without loss of balance to improve ability to get in and out of shower safely.   Baseline 09/05/16 3 seconds bilaterally   Time 8   Period Weeks   Status On-going     PT LONG TERM GOAL #3   Title Patient will be independent in home exercise program to improve strength/mobility for better functional independence with ADLs.  Baseline 09/05/16    Time 8   Period Weeks   Status On-going               Plan - 10/05/16 1644    Clinical Impression Statement Patient continues to perform well on a wide variety of exercises without significant LOB episodes or pain. L LE observed to be more steady and patient displays good stability during dynamic exercises.  Foot clearing ability seemed to improve and responds well to ankle weights to force more hip flexion.  Patient will continue to benefit from skilled physical therapy to improve dynamic balance to decrease fall risk.    Rehab Potential Good   PT Frequency 2x / week   PT Duration 8 weeks   PT Treatment/Interventions ADLs/Self Care Home Management;Aquatic Therapy;Cryotherapy;Electrical Stimulation;Moist Heat;Gait training;Stair training;Functional mobility training;Therapeutic activities;Therapeutic exercise;Balance training;Neuromuscular re-education;Patient/family education;Manual techniques;Passive range of motion;Dry needling;Energy conservation   PT Next Visit Plan Continue challenging dynamic balance    PT Home  Exercise Plan Walk for exercise everyday   Consulted and Agree with Plan of Care Patient;Family member/caregiver      Patient will benefit from skilled therapeutic intervention in order to improve the following deficits and impairments:  Abnormal gait, Decreased activity tolerance, Decreased balance, Decreased mobility, Decreased coordination, Decreased endurance, Dizziness, Difficulty walking  Visit Diagnosis: Unsteadiness on feet     Problem List Patient Active Problem List   Diagnosis Date Noted  . Dizziness 07/07/2016  . Urolithiasis 03/28/2016  . Pyuria 03/28/2016  . Hydronephrosis 03/27/2016  . Cardiac arrhythmia 07/22/2015  . Atrial fibrillation with RVR (HCC) 02/24/2015  . Cardiac conduction disorder 02/04/2015  . Arthritis 02/04/2015  . Atrial fibrillation (HCC) 02/04/2015  . Acid reflux 02/04/2015  . H/O vertigo 02/04/2015  . HLD (hyperlipidemia) 02/04/2015  . BP (high blood pressure) 02/04/2015  . Calculus of kidney 02/04/2015  . Sick sinus syndrome (HCC) 02/04/2015  . Fungal infection of nail 02/04/2015  . Type 2 diabetes mellitus (HCC) 02/04/2015  . Sepsis (HCC) 12/10/2014  . Systemic infection (HCC) 12/10/2014  . Sepsis(995.91) 12/10/2014  . Altered bowel function 10/13/2014  . Chronic diarrhea 10/13/2014  . Change in bowel habits 10/13/2014  . Senile dementia of Alzheimer's type 09/20/2014  . Alzheimer's dementia, late onset 09/20/2014  . Drug resistance 10/22/2013   This entire session was performed under direct supervision and direction of a licensed therapist/therapist assistant . I have personally read, edited and approve of the note as written.   Madilyn Hookouglas Tito Ausmus SPT  Lynnea MaizesJason D Huprich PT, DPT   Huprich,Jason 10/05/2016, 5:30 PM  [facility] MAIN [facility] [address] [place], [place], [address] Phone: [phone]   Fax:  [phone]  Name: William PigeonJames B Borak Sr. MRN: [id number] Date of Birth:  03/31/1932

## 2016-10-10 ENCOUNTER — Encounter: Payer: Self-pay | Admitting: Physical Therapy

## 2016-10-10 ENCOUNTER — Ambulatory Visit: Payer: Medicare Other | Admitting: Physical Therapy

## 2016-10-10 VITALS — BP 129/76 | HR 83

## 2016-10-10 DIAGNOSIS — R2681 Unsteadiness on feet: Secondary | ICD-10-CM

## 2016-10-10 NOTE — Therapy (Signed)
[place] MAIN [facility] SERVICES [address] [place], [place], [address] Phone: [phone]   Fax:  [phone]  Physical Therapy Treatment/Discharge Summary  Patient Details  Name: William BUFKIN Sr. MRN: [id number] Date of Birth: 28-Nov-1932 Referring Provider: Fulton Reek D  Encounter Date: 10/10/2016      PT End of Session - 10/10/16 1140    Visit Number 10   Number of Visits 17   Date for PT Re-Evaluation 02-Nov-2016   Authorization Type g codes 10/10   PT Start Time 1140   PT Stop Time 1225   PT Time Calculation (min) 45 min   Equipment Utilized During Treatment Gait belt   Activity Tolerance Patient tolerated treatment well   Behavior During Therapy WFL for tasks assessed/performed      Past Medical History:  Diagnosis Date  . Arthritis   . Atrial fibrillation ([place]) 2016   paroxysmal atrial fibrillation, sick sinus syndrome  . Complication of anesthesia    affects memory for a longer time after  . Dementia    alzheimers  . Essential hypertension   . GERD (gastroesophageal reflux disease)   . History of kidney stones   . Hyperlipemia   . Inguinal hernia recurrent bilateral   . Kidney stones     Past Surgical History:  Procedure Laterality Date  . CYSTOSCOPY W/ RETROGRADES Bilateral 03/27/2016   Procedure: CYSTOSCOPY WITH RETROGRADE PYELOGRAM;  Surgeon: Hollice Espy, MD;  Location: [facility] ORS;  Service: Urology;  Laterality: Bilateral;  . CYSTOSCOPY W/ URETERAL STENT PLACEMENT Bilateral 02/24/2015   Procedure: CYSTOSCOPY WITH STENT REPLACEMENT;  Surgeon: Nickie Retort, MD;  Location: [facility]S;  Service: Urology;  Laterality: Bilateral;  . CYSTOSCOPY W/ URETERAL STENT PLACEMENT Left 04/11/2016   Procedure: CYSTOSCOPY WITH STENT REPLACEMENT;  Surgeon: Hollice Espy, MD;  Location: [facility]S;  Service: Urology;  Laterality: Left;  . CYSTOSCOPY W/ URETERAL STENT REMOVAL Right 04/11/2016   Procedure: CYSTOSCOPY WITH STENT  REMOVAL;  Surgeon: Hollice Espy, MD;  Location: [facility]S;  Service: Urology;  Laterality: Right;  . CYSTOSCOPY WITH RETROGRADE PYELOGRAM, URETEROSCOPY AND STENT PLACEMENT Bilateral 12/10/2014   Procedure: CYSTOSCOPY WITH RETROGRADE PYELOGRAM, URETEROSCOPY AND STENT PLACEMENT;  Surgeon: Nickie Retort, MD;  Location: [facility];  Service: Urology;  Laterality: Bilateral;  . CYSTOSCOPY WITH STENT PLACEMENT Bilateral 12/10/2014   Procedure: CYSTOSCOPY WITH STENT PLACEMENT;  Surgeon: Nickie Retort, MD;  Location: [facility];  Service: Urology;  Laterality: Bilateral;  . CYSTOSCOPY WITH STENT PLACEMENT Bilateral 03/27/2016   Procedure: CYSTOSCOPY WITH STENT PLACEMENT;  Surgeon: Hollice Espy, MD;  Location: [facility];  Service: Urology;  Laterality: Bilateral;  . EXTRACORPOREAL SHOCK WAVE LITHOTRIPSY Right 01/07/2015   Procedure: EXTRACORPOREAL SHOCK WAVE LITHOTRIPSY (ESWL);  Surgeon: Collier Flowers, MD;  Location: [facility]S;  Service: Urology;  Laterality: Right;  . EXTRACORPOREAL SHOCK WAVE LITHOTRIPSY Left 01/21/2015   Procedure: EXTRACORPOREAL SHOCK WAVE LITHOTRIPSY (ESWL);  Surgeon: Nickie Retort, MD;  Location: [facility];  Service: Urology;  Laterality: Left;  . EYE SURGERY Bilateral    Cataract Extraction with IOL  . HERNIA REPAIR Bilateral    inguinal  . URETEROSCOPY WITH HOLMIUM LASER LITHOTRIPSY Bilateral 02/24/2015   Procedure: URETEROSCOPY WITH HOLMIUM LASER LITHOTRIPSY /STONE BASKETING;  Surgeon: Nickie Retort, MD;  Location: [facility];  Service: Urology;  Laterality: Bilateral;  . URETEROSCOPY WITH HOLMIUM LASER LITHOTRIPSY Bilateral 04/11/2016   Procedure: URETEROSCOPY WITH HOLMIUM LASER LITHOTRIPSY;  Surgeon: Hollice Espy, MD;  Location: [facility]  ORS;  Service: Urology;  Laterality: Bilateral;    Vitals:   10/10/16 1155 10/10/16 1203  BP: 129/83 129/76  Pulse: 60 83  SpO2: 99% 98%        Subjective Assessment - 10/10/16 1140    Subjective Patient is doing well today    Patient is accompained by: Family member   Pertinent History Patient presents with history of kidney stones and a fib. Pateint presents multiple CVAs; first on was in Febuary 2018 and was admitted to [facility] and reduced eliquis medication. Recovery went well. Second CVA was in May 2018    Limitations Walking   How long can you sit comfortably? unlimited   How long can you stand comfortably? 1 hour   How long can you walk comfortably? 1 hour   Patient Stated Goals Patient wants to be able to paint, outdoor projects, and tolerate longer walks   Currently in Pain? No/denies   Multiple Pain Sites No       Treatment:  Nu Step warm up 5 minutes   6 minute walk test performed: Single leg balance tested:   HEP education and instruction: sit to stand with BTB around knees, lunges, and step ups with high knees.                          PT Education - 10/10/16 1140    Education provided Yes   Education Details Plan to discharge or continue therapy   Person(s) Educated Patient;Spouse   Methods Explanation   Comprehension Verbalized understanding             PT Long Term Goals - 10/10/16 1159      PT LONG TERM GOAL #1   Title Patient will increase distance of 6 minute walk test to 1570 ft to fall into WNL category for his age group.   Baseline 09/05/16 1180 ft 10/09/16: 1260 ft    Time 8   Period Weeks   Status Partially Met     PT LONG TERM GOAL #2   Title Patient will tolerate 10 seconds of single leg stance without loss of balance to improve ability to get in and out of shower safely.   Baseline 09/05/16 3 seconds bilaterally 10/09/16: 5 seconds bilaterally    Time 8   Period Weeks   Status On-going     PT LONG TERM GOAL #3   Title Patient will be independent in home exercise program to improve strength/mobility for better functional independence with ADLs.   Baseline 09/05/16    Time 8   Period Weeks   Status Achieved                Plan - 10/10/16 1300    Clinical Impression Statement Today is the 10th visit so outcome measures were performed to track progress.  Patient completed 6 minute walk test without any complications and improved distance by 80 ft. Although minimal improvement seen with his goals, patient states that feeling much more steady and strong during ambulation and feels much safer with negotiating abnormal surfaces.  Patient and spouse consulted to discuss plan of discontinuing therapy due to performance in therapy was understood and agreed to.  Patient and spouse were educated on HEP with good understanding. Patient is going to join group exercise group with spouse to continue exercising.   Rehab Potential Good   PT Frequency 2x / week   PT Duration 8 weeks   PT Treatment/Interventions  ADLs/Self Care Home Management;Aquatic Therapy;Cryotherapy;Electrical Stimulation;Moist Heat;Gait training;Stair training;Functional mobility training;Therapeutic activities;Therapeutic exercise;Balance training;Neuromuscular re-education;Patient/family education;Manual techniques;Passive range of motion;Dry needling;Energy conservation   PT Next Visit Plan --   PT Home Exercise Plan Walk for exercise everyday, sit to stands, lunges and step ups with high knee   Consulted and Agree with Plan of Care Patient;Family member/caregiver      Patient will benefit from skilled therapeutic intervention in order to improve the following deficits and impairments:  Abnormal gait, Decreased activity tolerance, Decreased balance, Decreased mobility, Decreased coordination, Decreased endurance, Dizziness, Difficulty walking  Visit Diagnosis: Unsteadiness on feet     Problem List Patient Active Problem List   Diagnosis Date Noted  . Dizziness 07/07/2016  . Urolithiasis 03/28/2016  . Pyuria 03/28/2016  . Hydronephrosis 03/27/2016  . Cardiac arrhythmia 07/22/2015  . Atrial fibrillation with RVR ([place]) 02/24/2015  . Cardiac  conduction disorder 02/04/2015  . Arthritis 02/04/2015  . Atrial fibrillation ([place]) 02/04/2015  . Acid reflux 02/04/2015  . H/O vertigo 02/04/2015  . HLD (hyperlipidemia) 02/04/2015  . BP (high blood pressure) 02/04/2015  . Calculus of kidney 02/04/2015  . Sick sinus syndrome ([place]) 02/04/2015  . Fungal infection of nail 02/04/2015  . Type 2 diabetes mellitus ([place]) 02/04/2015  . Sepsis ([place]) 12/10/2014  . Systemic infection ([place]) 12/10/2014  . Sepsis(995.91) 12/10/2014  . Altered bowel function 10/13/2014  . Chronic diarrhea 10/13/2014  . Change in bowel habits 10/13/2014  . Senile dementia of Alzheimer's type 09/20/2014  . Alzheimer's dementia, late onset 09/20/2014  . Drug resistance 10/22/2013  This entire session was performed under direct supervision and direction of a licensed therapist/therapist assistant . I have personally read, edited and approve of the note as written. [facility] [address] [place], [place] DPT 10/10/2016, 4:35 PM  [place] MAIN [facility] SERVICES [address] [place], [place], [address] Phone: [phone]   Fax:  [phone]  Name: CONNELL BOGNAR Sr. MRN: [id number] Date of Birth: [birth year]

## 2016-10-12 ENCOUNTER — Ambulatory Visit: Payer: Medicare Other | Admitting: Physical Therapy

## 2016-10-17 ENCOUNTER — Ambulatory Visit: Payer: Medicare Other | Admitting: Physical Therapy

## 2016-10-19 ENCOUNTER — Ambulatory Visit (INDEPENDENT_AMBULATORY_CARE_PROVIDER_SITE_OTHER): Payer: Medicare Other | Admitting: Urology

## 2016-10-19 ENCOUNTER — Ambulatory Visit: Payer: Medicare Other | Admitting: Physical Therapy

## 2016-10-19 ENCOUNTER — Encounter: Payer: Self-pay | Admitting: Urology

## 2016-10-19 VITALS — BP 95/56 | HR 87 | Ht 69.0 in | Wt 154.6 lb

## 2016-10-19 DIAGNOSIS — N200 Calculus of kidney: Secondary | ICD-10-CM

## 2016-10-19 DIAGNOSIS — I639 Cerebral infarction, unspecified: Secondary | ICD-10-CM | POA: Diagnosis not present

## 2016-10-19 MED ORDER — POTASSIUM CITRATE ER 15 MEQ (1620 MG) PO TBCR: 1.0000 | EXTENDED_RELEASE_TABLET | Freq: Three times a day (TID) | ORAL | 11 refills | Status: DC

## 2016-10-19 NOTE — Progress Notes (Signed)
10/19/2016 9:18 AM   William Lozano Sr. Feb 18, 1933 [id number]  Referring provider: Marguarite Arbour, MD [address] [facility] [place], [place] [address]  Chief Complaint  Patient presents with  . Follow-up    Litho results    HPI: The patient is a 83 year old gentleman presents today for follow-up.  1 - Nephrolithiasis- s/p bilateral ureteroscopy with left laser lithotripsy to stone free 04/11/16 for bilateral nephrolithiasis. KUB today with no obvious stones. He does have history of recurrent nephrolithiasis. Stones are 95% calcium oxide monohydrate and 5% calcium phosphate. He has a multiple occasions had bilateral obstructing ureteral stones with sepsis.  As such, he underwent a 24-hour urine study. His urinary pH is very low at 4.792. His supersaturation uric acid was high at 2.84. His urine citrate level was low at 130. His urine output was very low at 0.77 L per day.    Patient does note that he has frequent loose stools. He has been worked up for this by his primary care provider with no source.   PMH: Past Medical History:  Diagnosis Date  . Arthritis   . Atrial fibrillation ([facility]) 2016   paroxysmal atrial fibrillation, sick sinus syndrome  . Complication of anesthesia    affects memory for a longer time after  . Dementia    alzheimers  . Essential hypertension   . GERD (gastroesophageal reflux disease)   . History of kidney stones   . Hyperlipemia   . Inguinal hernia recurrent bilateral   . Kidney stones     Surgical History: Past Surgical History:  Procedure Laterality Date  . CYSTOSCOPY W/ RETROGRADES Bilateral 03/27/2016   Procedure: CYSTOSCOPY WITH RETROGRADE PYELOGRAM;  Surgeon: Vanna Scotland, MD;  Location: [facility] ORS;  Service: Urology;  Laterality: Bilateral;  . CYSTOSCOPY W/ URETERAL STENT PLACEMENT Bilateral 02/24/2015   Procedure: CYSTOSCOPY WITH STENT REPLACEMENT;  Surgeon: Hildred Laser, MD;  Location: [facility]S;  Service:  Urology;  Laterality: Bilateral;  . CYSTOSCOPY W/ URETERAL STENT PLACEMENT Left 04/11/2016   Procedure: CYSTOSCOPY WITH STENT REPLACEMENT;  Surgeon: Vanna Scotland, MD;  Location: [facility];  Service: Urology;  Laterality: Left;  . CYSTOSCOPY W/ URETERAL STENT REMOVAL Right 04/11/2016   Procedure: CYSTOSCOPY WITH STENT REMOVAL;  Surgeon: Vanna Scotland, MD;  Location: [facility];  Service: Urology;  Laterality: Right;  . CYSTOSCOPY WITH RETROGRADE PYELOGRAM, URETEROSCOPY AND STENT PLACEMENT Bilateral 12/10/2014   Procedure: CYSTOSCOPY WITH RETROGRADE PYELOGRAM, URETEROSCOPY AND STENT PLACEMENT;  Surgeon: Hildred Laser, MD;  Location: [facility];  Service: Urology;  Laterality: Bilateral;  . CYSTOSCOPY WITH STENT PLACEMENT Bilateral 12/10/2014   Procedure: CYSTOSCOPY WITH STENT PLACEMENT;  Surgeon: Hildred Laser, MD;  Location: [facility];  Service: Urology;  Laterality: Bilateral;  . CYSTOSCOPY WITH STENT PLACEMENT Bilateral 03/27/2016   Procedure: CYSTOSCOPY WITH STENT PLACEMENT;  Surgeon: Vanna Scotland, MD;  Location: [facility];  Service: Urology;  Laterality: Bilateral;  . EXTRACORPOREAL SHOCK WAVE LITHOTRIPSY Right 01/07/2015   Procedure: EXTRACORPOREAL SHOCK WAVE LITHOTRIPSY (ESWL);  Surgeon: Lorraine Lax, MD;  Location: [facility];  Service: Urology;  Laterality: Right;  . EXTRACORPOREAL SHOCK WAVE LITHOTRIPSY Left 01/21/2015   Procedure: EXTRACORPOREAL SHOCK WAVE LITHOTRIPSY (ESWL);  Surgeon: Hildred Laser, MD;  Location: [facility];  Service: Urology;  Laterality: Left;  . EYE SURGERY Bilateral    Cataract Extraction with IOL  . HERNIA REPAIR Bilateral    inguinal  . URETEROSCOPY WITH HOLMIUM LASER LITHOTRIPSY Bilateral 02/24/2015   Procedure: URETEROSCOPY  WITH HOLMIUM LASER LITHOTRIPSY /STONE BASKETING;  Surgeon: Hildred LaserBrian Kairi Harbert Fitterer, MD;  Location: [facility];  Service: Urology;  Laterality: Bilateral;  . URETEROSCOPY WITH HOLMIUM LASER LITHOTRIPSY Bilateral 04/11/2016   Procedure:  URETEROSCOPY WITH HOLMIUM LASER LITHOTRIPSY;  Surgeon: [name], MD;  Location: [facility];  Service: Urology;  Laterality: Bilateral;    Home Medications:  Allergies as of 10/19/2016      Reactions   Codeine Other (See Comments)   Causes side effects   Penicillin G Rash   Sulfa Antibiotics Rash      Medication List       Accurate as of 10/19/16  9:18 AM. Always use your most recent med list.          apixaban 2.5 MG Tabs tablet Commonly known as:  ELIQUIS Take 2.5 mg by mouth 2 (two) times daily.   hydrochlorothiazide 25 MG tablet Commonly known as:  HYDRODIURIL Take 25 mg by mouth daily.   metoCLOPramide 10 MG tablet Commonly known as:  REGLAN Take 10 mg by mouth every 6 (six) hours as needed for nausea.   metoprolol succinate 25 MG 24 hr tablet Commonly known as:  TOPROL-XL Take 25 mg by mouth daily.   ondansetron 4 MG disintegrating tablet Commonly known as:  ZOFRAN ODT Take 1 tablet (4 mg total) by mouth every 4 (four) hours as needed for nausea or vomiting.   pantoprazole 40 MG tablet Commonly known as:  PROTONIX Take 40 mg by mouth daily.   Potassium Citrate 15 MEQ (1620 MG) Tbcr Commonly known as:  UROCIT-K 15 Take 1 tablet by mouth 3 (three) times daily.   rivastigmine 3 MG capsule Commonly known as:  EXELON Take 3 mg by mouth 2 (two) times daily.   simvastatin 20 MG tablet Commonly known as:  ZOCOR Take 20 mg by mouth daily.   traMADol 50 MG tablet Commonly known as:  ULTRAM Take 1 tablet (50 mg total) by mouth every 6 (six) hours as needed for moderate pain.            Discharge Care Instructions        Start     Ordered   10/19/16 0000  Potassium Citrate (UROCIT-K 15) 15 MEQ (1620 MG) TBCR  3 times daily     10/19/16 0917      Allergies:  Allergies  Allergen Reactions  . Codeine Other (See Comments)    Causes side effects  . Penicillin G Rash  . Sulfa Antibiotics Rash    Family History: Family History  Problem  Relation Age of Onset  . Heart attack Mother   . Heart attack Father   . Hypertension Unknown   . Kidney disease Neg Hx   . Prostate cancer Neg Hx     Social History:  reports that he has never smoked. He has never used smokeless tobacco. He reports that he does not drink alcohol or use drugs.  ROS: UROLOGY Frequent Urination?: No Hard to postpone urination?: No Burning/pain with urination?: No Get up at night to urinate?: No Leakage of urine?: No Urine stream starts and stops?: No Trouble starting stream?: No Do you have to strain to urinate?: No Blood in urine?: No Urinary tract infection?: No Sexually transmitted disease?: No Injury to kidneys or bladder?: No Painful intercourse?: No Weak stream?: No Erection problems?: No Penile pain?: No  Gastrointestinal Nausea?: No Vomiting?: No Indigestion/heartburn?: No Diarrhea?: No Constipation?: No  Constitutional Fever: No Night sweats?: No Weight loss?: No Fatigue?:  No  Skin Skin rash/lesions?: No Itching?: No  Eyes Blurred vision?: No Double vision?: No  Ears/Nose/Throat Sore throat?: No Sinus problems?: No  Hematologic/Lymphatic Swollen glands?: No Easy bruising?: No  Cardiovascular Leg swelling?: No Chest pain?: No  Respiratory Cough?: No Shortness of breath?: No  Endocrine Excessive thirst?: No  Musculoskeletal Back pain?: No Joint pain?: No  Neurological Headaches?: No Dizziness?: No  Psychologic Depression?: No Anxiety?: No  Physical Exam: BP (!) 95/56 (BP Location: Left Arm, Patient Position: Sitting, Cuff Size: Normal)   Pulse 87   Ht 5\' 9"  (1.753 m)   Wt 154 lb 9.6 oz (70.1 kg)   BMI 22.83 kg/m   Constitutional:  Alert and oriented, No acute distress. HEENT: [place] AT, moist mucus membranes.  Trachea midline, no masses. Cardiovascular: No clubbing, cyanosis, or edema. Respiratory: Normal respiratory effort, no increased work of breathing. GI: Abdomen is soft, nontender,  nondistended, no abdominal masses GU: No CVA tenderness.  Skin: No rashes, bruises or suspicious lesions. Lymph: No cervical or inguinal adenopathy. Neurologic: Grossly intact, no focal deficits, moving all 4 extremities. Psychiatric: Normal mood and affect.  Laboratory Data: Lab Results  Component Value Date   WBC 14.0 (H) 07/07/2016   HGB 16.7 07/07/2016   HCT 49.6 07/07/2016   MCV 90.0 07/07/2016   PLT 284 07/07/2016    Lab Results  Component Value Date   CREATININE 1.31 (H) 07/07/2016    No results found for: PSA  No results found for: TESTOSTERONE  Lab Results  Component Value Date   HGBA1C 6.9 (H) 07/08/2016    Urinalysis    Component Value Date/Time   COLORURINE YELLOW (A) 05/12/2016 1217   APPEARANCEUR CLEAR (A) 05/12/2016 1217   APPEARANCEUR Cloudy (A) 04/18/2016 1525   LABSPEC 1.014 05/12/2016 1217   PHURINE 5.0 05/12/2016 1217   GLUCOSEU NEGATIVE 05/12/2016 1217   HGBUR SMALL (A) 05/12/2016 1217   BILIRUBINUR NEGATIVE 05/12/2016 1217   BILIRUBINUR Negative 04/18/2016 1525   KETONESUR NEGATIVE 05/12/2016 1217   PROTEINUR 30 (A) 05/12/2016 1217   NITRITE NEGATIVE 05/12/2016 1217   LEUKOCYTESUR NEGATIVE 05/12/2016 1217   LEUKOCYTESUR Trace (A) 04/18/2016 1525     Assessment & Plan:    1. Recurrent nephrolithiasis I discussed with the patient that his stone formation is driven mainly by his low urine output, his low urine pH, and hypocitraturia. I strongly encouraged him to increase his water intake to increase his urinary output. He was instructed to drink at least 64 ounces a day, but I [place] to drink more due to his fluid loss from diarrhea. We will also start him on Urocit-K 15 mEq 3 times per day. Per American urological Association guidelines, we will repeat his 24-hour urine study in 3 months after trying the above therapies to ensure that there is improvement. He'll follow-up after undergoing this test.  Return in about 3 months (around  01/19/2017) for after repeat 24 hr urine study.  Hildred Laser, MD  [facility] [address] [place], [place] [address] [phone]

## 2016-10-24 ENCOUNTER — Ambulatory Visit: Payer: Medicare Other | Admitting: Physical Therapy

## 2016-10-26 ENCOUNTER — Ambulatory Visit: Payer: Medicare Other | Admitting: Physical Therapy

## 2016-10-31 ENCOUNTER — Ambulatory Visit: Payer: Medicare Other | Admitting: Physical Therapy

## 2016-11-02 ENCOUNTER — Ambulatory Visit: Payer: Medicare Other | Admitting: Physical Therapy

## 2016-11-03 ENCOUNTER — Other Ambulatory Visit: Payer: Self-pay | Admitting: Internal Medicine

## 2016-11-03 DIAGNOSIS — R410 Disorientation, unspecified: Secondary | ICD-10-CM

## 2016-11-03 DIAGNOSIS — R42 Dizziness and giddiness: Secondary | ICD-10-CM

## 2016-11-09 ENCOUNTER — Ambulatory Visit
Admission: RE | Admit: 2016-11-09 | Discharge: 2016-11-09 | Disposition: A | Discharge status: Home / Self Care | Payer: Medicare Other | Source: Ambulatory Visit | Attending: Internal Medicine | Admitting: Internal Medicine

## 2016-11-09 DIAGNOSIS — R42 Dizziness and giddiness: Secondary | ICD-10-CM | POA: Diagnosis not present

## 2016-11-09 DIAGNOSIS — Z8673 Personal history of transient ischemic attack (TIA), and cerebral infarction without residual deficits: Secondary | ICD-10-CM | POA: Diagnosis not present

## 2016-11-09 DIAGNOSIS — G319 Degenerative disease of nervous system, unspecified: Secondary | ICD-10-CM | POA: Insufficient documentation

## 2016-11-09 DIAGNOSIS — R410 Disorientation, unspecified: Secondary | ICD-10-CM | POA: Diagnosis not present

## 2016-11-25 DIAGNOSIS — G4714 Hypersomnia due to medical condition: Secondary | ICD-10-CM | POA: Insufficient documentation

## 2016-11-29 ENCOUNTER — Emergency Department
Admission: EM | Admit: 2016-11-29 | Discharge: 2016-11-29 | Disposition: A | Discharge status: Home / Self Care | Payer: Medicare Other | Attending: Emergency Medicine | Admitting: Emergency Medicine

## 2016-11-29 ENCOUNTER — Other Ambulatory Visit: Payer: Self-pay

## 2016-11-29 ENCOUNTER — Emergency Department: Payer: Medicare Other

## 2016-11-29 DIAGNOSIS — Z8673 Personal history of transient ischemic attack (TIA), and cerebral infarction without residual deficits: Secondary | ICD-10-CM | POA: Insufficient documentation

## 2016-11-29 DIAGNOSIS — R55 Syncope and collapse: Secondary | ICD-10-CM

## 2016-11-29 DIAGNOSIS — G309 Alzheimer's disease, unspecified: Secondary | ICD-10-CM | POA: Insufficient documentation

## 2016-11-29 DIAGNOSIS — Z79899 Other long term (current) drug therapy: Secondary | ICD-10-CM | POA: Insufficient documentation

## 2016-11-29 DIAGNOSIS — R319 Hematuria, unspecified: Secondary | ICD-10-CM | POA: Insufficient documentation

## 2016-11-29 DIAGNOSIS — E785 Hyperlipidemia, unspecified: Secondary | ICD-10-CM | POA: Insufficient documentation

## 2016-11-29 DIAGNOSIS — E860 Dehydration: Secondary | ICD-10-CM

## 2016-11-29 DIAGNOSIS — I480 Paroxysmal atrial fibrillation: Secondary | ICD-10-CM | POA: Diagnosis not present

## 2016-11-29 DIAGNOSIS — I951 Orthostatic hypotension: Secondary | ICD-10-CM

## 2016-11-29 DIAGNOSIS — I10 Essential (primary) hypertension: Secondary | ICD-10-CM | POA: Diagnosis not present

## 2016-11-29 HISTORY — DX: Cerebral infarction, unspecified: I63.9

## 2016-11-29 LAB — URINALYSIS, COMPLETE (UACMP) WITH MICROSCOPIC
BACTERIA UA: NONE SEEN
BILIRUBIN URINE: NEGATIVE
GLUCOSE, UA: NEGATIVE mg/dL
Ketones, ur: NEGATIVE mg/dL
LEUKOCYTES UA: NEGATIVE
NITRITE: NEGATIVE
PROTEIN: 30 mg/dL — AB
Specific Gravity, Urine: 1.012 (ref 1.005–1.030)
Squamous Epithelial / LPF: NONE SEEN
pH: 5 (ref 5.0–8.0)

## 2016-11-29 LAB — CBC
HEMATOCRIT: 49.6 % (ref 40.0–52.0)
HEMOGLOBIN: 16.8 g/dL (ref 13.0–18.0)
MCH: 30.7 pg (ref 26.0–34.0)
MCHC: 33.9 g/dL (ref 32.0–36.0)
MCV: 90.5 fL (ref 80.0–100.0)
Platelets: 332 10*3/uL (ref 150–440)
RBC: 5.49 MIL/uL (ref 4.40–5.90)
RDW: 14.3 % (ref 11.5–14.5)
WBC: 12.7 10*3/uL — AB (ref 3.8–10.6)

## 2016-11-29 LAB — BASIC METABOLIC PANEL
Anion gap: 11 (ref 5–15)
BUN: 26 mg/dL — ABNORMAL HIGH (ref 6–20)
CO2: 28 mmol/L (ref 22–32)
Calcium: 9.7 mg/dL (ref 8.9–10.3)
Chloride: 98 mmol/L — ABNORMAL LOW (ref 101–111)
Creatinine, Ser: 1.44 mg/dL — ABNORMAL HIGH (ref 0.61–1.24)
GFR, EST AFRICAN AMERICAN: 51 mL/min — AB (ref >60)
GFR, EST NON AFRICAN AMERICAN: 44 mL/min — AB (ref >60)
Glucose, Bld: 126 mg/dL — ABNORMAL HIGH (ref 65–99)
POTASSIUM: 4 mmol/L (ref 3.5–5.1)
SODIUM: 137 mmol/L (ref 135–145)

## 2016-11-29 LAB — GLUCOSE, CAPILLARY: GLUCOSE-CAPILLARY: 120 mg/dL — AB (ref 65–99)

## 2016-11-29 MED ORDER — SODIUM CHLORIDE 0.9 % IV BOLUS (SEPSIS)
500.0000 mL | Freq: Once | INTRAVENOUS | Status: AC
  Administered 2016-11-29: 500 mL via INTRAVENOUS
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
## 2016-11-29 NOTE — ED Provider Notes (Signed)
[facility] Emergency Department Provider Note  ____________________________________________  Time seen: Approximately 1:04 PM  I have reviewed the triage vital signs and the nursing notes.   HISTORY  Chief Complaint Loss of Consciousness and Seizure like activity   HPI Story William Lozano. is a 83 y.o. male with h/o ICH, sick sinus syndrome, afib on Eliquis, HTN, HLD, dementia who presents for evaluation of syncope. Patient was in the car when he started feeling very hot and lightheaded. Had a syncopal episode lasting a few minutes with jerking of his extremities. Patient denies any prior history of syncope. No history of seizure disorder. No postictal state, no urinary or bowel loss, no tongue trauma. Patient was sitting down and did not sustain any trauma. Patient only had a bowl of cereal today and nothing else to eat or drink. He denies headache, palpitations, changes in vision, chest pain, shortness of breath, abdominal pain, back pain, nausea, vomiting, diarrhea, fever, chills, dysuria both preceding the syncopal episode or now.   Past Medical History:  Diagnosis Date  . Arthritis   . Atrial fibrillation (HCC) 2016   paroxysmal atrial fibrillation, sick sinus syndrome  . Complication of anesthesia    affects memory for a longer time after  . Dementia    alzheimers  . Essential hypertension   . GERD (gastroesophageal reflux disease)   . History of kidney stones   . Hyperlipemia   . Inguinal hernia recurrent bilateral   . Kidney stones   . Stroke [facility])     Patient Active Problem List   Diagnosis Date Noted  . Dizziness 07/07/2016  . Urolithiasis 03/28/2016  . Pyuria 03/28/2016  . Hydronephrosis 03/27/2016  . Cardiac arrhythmia 07/22/2015  . Atrial fibrillation with RVR (HCC) 02/24/2015  . Cardiac conduction disorder 02/04/2015  . Arthritis 02/04/2015  . Atrial fibrillation (HCC) 02/04/2015  . Acid reflux 02/04/2015  . H/O vertigo 02/04/2015   . HLD (hyperlipidemia) 02/04/2015  . BP (high blood pressure) 02/04/2015  . Calculus of kidney 02/04/2015  . Sick sinus syndrome (HCC) 02/04/2015  . Fungal infection of nail 02/04/2015  . Type 2 diabetes mellitus (HCC) 02/04/2015  . Sepsis (HCC) 12/10/2014  . Systemic infection (HCC) 12/10/2014  . Sepsis(995.91) 12/10/2014  . Altered bowel function 10/13/2014  . Chronic diarrhea 10/13/2014  . Change in bowel habits 10/13/2014  . Senile dementia of Alzheimer's type 09/20/2014  . Alzheimer's dementia, late onset 09/20/2014  . Drug resistance 10/22/2013    Past Surgical History:  Procedure Laterality Date  . CYSTOSCOPY W/ RETROGRADES Bilateral 03/27/2016   Procedure: CYSTOSCOPY WITH RETROGRADE PYELOGRAM;  Surgeon: Vanna Scotland, MD;  Location: [facility] ORS;  Service: Urology;  Laterality: Bilateral;  . CYSTOSCOPY W/ URETERAL STENT PLACEMENT Bilateral 02/24/2015   Procedure: CYSTOSCOPY WITH STENT REPLACEMENT;  Surgeon: Hildred Laser, MD;  Location: [facility] OR[facility];  Service: Urology;  Laterality: Bilateral;  . CYSTOSCOPY W/ URETERAL STENT PLACEMENT Left 04/11/2016   Procedure: CYSTOSCOPY WITH STENT REPLACEMENT;  Surgeon: Vanna Scotland, MD;  Location: [facility] OR[facility];  Service: Urology;  Laterality: Left;  . CYSTOSCOPY W/ URETERAL STENT REMOVAL Right 04/11/2016   Procedure: CYSTOSCOPY WITH STENT REMOVAL;  Surgeon: Vanna Scotland, MD;  Location: [facility] OR[facility];  Service: Urology;  Laterality: Right;  . CYSTOSCOPY WITH RETROGRADE PYELOGRAM, URETEROSCOPY AND STENT PLACEMENT Bilateral 12/10/2014   Procedure: CYSTOSCOPY WITH RETROGRADE PYELOGRAM, URETEROSCOPY AND STENT PLACEMENT;  Surgeon: Hildred Laser, MD;  Location: [facility];  Service: Urology;  Laterality: Bilateral;  . CYSTOSCOPY WITH  STENT PLACEMENT Bilateral 12/10/2014   Procedure: CYSTOSCOPY WITH STENT PLACEMENT;  Surgeon: Hildred Laser, MD;  Location: [facility];  Service: Urology;  Laterality: Bilateral;  . CYSTOSCOPY WITH STENT PLACEMENT Bilateral  03/27/2016   Procedure: CYSTOSCOPY WITH STENT PLACEMENT;  Surgeon: Vanna Scotland, MD;  Location: [facility];  Service: Urology;  Laterality: Bilateral;  . EXTRACORPOREAL SHOCK WAVE LITHOTRIPSY Right 01/07/2015   Procedure: EXTRACORPOREAL SHOCK WAVE LITHOTRIPSY (ESWL);  Surgeon: Lorraine Lax, MD;  Location: [facility];  Service: Urology;  Laterality: Right;  . EXTRACORPOREAL SHOCK WAVE LITHOTRIPSY Left 01/21/2015   Procedure: EXTRACORPOREAL SHOCK WAVE LITHOTRIPSY (ESWL);  Surgeon: Hildred Laser, MD;  Location: [facility];  Service: Urology;  Laterality: Left;  . EYE SURGERY Bilateral    Cataract Extraction with IOL  . HERNIA REPAIR Bilateral    inguinal  . URETEROSCOPY WITH HOLMIUM LASER LITHOTRIPSY Bilateral 02/24/2015   Procedure: URETEROSCOPY WITH HOLMIUM LASER LITHOTRIPSY /STONE BASKETING;  Surgeon: Hildred Laser, MD;  Location: [facility];  Service: Urology;  Laterality: Bilateral;  . URETEROSCOPY WITH HOLMIUM LASER LITHOTRIPSY Bilateral 04/11/2016   Procedure: URETEROSCOPY WITH HOLMIUM LASER LITHOTRIPSY;  Surgeon: Vanna Scotland, MD;  Location: [facility];  Service: Urology;  Laterality: Bilateral;    Prior to Admission medications   Medication Sig Start Date End Date Taking? Authorizing Provider  apixaban (ELIQUIS) 2.5 MG TABS tablet Take 2.5 mg by mouth 2 (two) times daily.    Yes [provider]  hydrochlorothiazide (HYDRODIURIL) 25 MG tablet Take 12.5 mg by mouth daily.  04/12/16  Yes [provider]  metoprolol succinate (TOPROL-XL) 25 MG 24 hr tablet Take 25 mg by mouth daily.   Yes [provider]  pantoprazole (PROTONIX) 40 MG tablet Take 40 mg by mouth daily.   Yes [provider]  rivastigmine (EXELON) 3 MG capsule Take 3 mg by mouth 2 (two) times daily.    Yes [provider]  sertraline (ZOLOFT) 25 MG tablet Take 25 mg by mouth daily.   Yes [provider]  simvastatin (ZOCOR) 20 MG tablet Take 20 mg by mouth daily.   Yes  [provider]  ondansetron (ZOFRAN ODT) 4 MG disintegrating tablet Take 1 tablet (4 mg total) by mouth every 4 (four) hours as needed for nausea or vomiting. Patient not taking: Reported on 08/16/2016 04/12/16   Michiel Cowboy A, PA-C  Potassium Citrate (UROCIT-K 15) 15 MEQ (1620 MG) TBCR Take 1 tablet by mouth 3 (three) times daily. Patient not taking: Reported on 11/29/2016 10/19/16   Hildred Laser, MD  traMADol (ULTRAM) 50 MG tablet Take 1 tablet (50 mg total) by mouth every 6 (six) hours as needed for moderate pain. Patient not taking: Reported on 11/29/2016 03/28/16   Katharina Caper, MD    Allergies Codeine; Penicillin g; and Sulfa antibiotics  Family History  Problem Relation Age of Onset  . Heart attack Mother   . Heart attack Father   . Hypertension Unknown   . Kidney disease Neg Hx   . Prostate cancer Neg Hx     Social History Social History  Substance Use Topics  . Smoking status: Never Smoker  . Smokeless tobacco: Never Used  . Alcohol use No    Review of Systems  Constitutional: Negative for fever. +syncope Eyes: Negative for visual changes. ENT: Negative for sore throat. Neck: No neck pain  Cardiovascular: Negative for chest pain. Respiratory: Negative for shortness of breath. Gastrointestinal: Negative for abdominal pain, vomiting or diarrhea. Genitourinary: Negative  for dysuria. Musculoskeletal: Negative for back pain. Skin: Negative for rash. Neurological: Negative for headaches, weakness or numbness. Psych: No SI or HI  ____________________________________________   PHYSICAL EXAM:  VITAL SIGNS: ED Triage Vitals  Enc Vitals Group     BP 11/29/16 1221 103/71     Pulse Rate 11/29/16 1221 76     Resp 11/29/16 1221 16     Temp 11/29/16 1221 97.7 F (36.5 C)     Temp Source 11/29/16 1221 Oral     SpO2 11/29/16 1221 98 %     Weight 11/29/16 1222 153 lb (69.4 kg)     Height 11/29/16 1222  (1.676 m)     Head Circumference --       Peak Flow --      Pain Score --      Pain Loc --      Pain Edu? --      Excl. in GC? --     Constitutional: Alert and oriented. Well appearing and in no apparent distress. HEENT:      Head: Normocephalic and atraumatic.         Eyes: Conjunctivae are normal. Sclera is non-icteric.       Mouth/Throat: Mucous membranes are moist.       Neck: Supple with no signs of meningismus. Cardiovascular: Regular rate and rhythm. No murmurs, gallops, or rubs. 2+ symmetrical distal pulses are present in all extremities. No JVD. Respiratory: Normal respiratory effort. Lungs are clear to auscultation bilaterally. No wheezes, crackles, or rhonchi.  Gastrointestinal: Soft, non tender, and non distended with positive bowel sounds. No rebound or guarding. Genitourinary: No CVA tenderness. Musculoskeletal: Nontender with normal range of motion in all extremities. No edema, cyanosis, or erythema of extremities. Neurologic: Normal speech and language. A & O x3, PERRL, EOMI, no nystagmus, CN II-XII intact, motor testing reveals good tone and bulk throughout. There is no evidence of pronator drift or dysmetria. Muscle strength is 5/5 throughout. Sensory examination is intact. Gait deferred Skin: Skin is warm, dry and intact. No rash noted. Psychiatric: Mood and affect are normal. Speech and behavior are normal.  ____________________________________________   LABS (all labs ordered are listed, but only abnormal results are displayed)  Labs Reviewed  BASIC METABOLIC PANEL - Abnormal; Notable for the following:       Result Value   Chloride 98 (*)    Glucose, Bld 126 (*)    BUN 26 (*)    Creatinine, Ser 1.44 (*)    GFR calc non Af Amer 44 (*)    GFR calc Af Amer 51 (*)    All other components within normal limits  CBC - Abnormal; Notable for the following:    WBC 12.7 (*)    All other components within normal limits  URINALYSIS, COMPLETE (UACMP) WITH MICROSCOPIC - Abnormal; Notable for the following:     Color, Urine AMBER (*)    APPearance CLOUDY (*)    Hgb urine dipstick LARGE (*)    Protein, ur 30 (*)    All other components within normal limits  GLUCOSE, CAPILLARY - Abnormal; Notable for the following:    Glucose-Capillary 120 (*)    All other components within normal limits  CBG MONITORING, ED   ____________________________________________  EKG  ED ECG REPORT I, Nita Sickle, the attending physician, personally viewed and interpreted this ECG.  Atrial fibrillation with PVCs, rate of 62, normal QRS and QTc intervals, normal axis, no ST elevations or depressions, diffuse T-wave  flattening. No significant changes when compared to prior.  ____________________________________________  RADIOLOGY  Head CT: No CT evidence of acute intracranial abnormality. Re- demonstration of chronic microvascular ischemic disease. ____________________________________________   PROCEDURES  Procedure(s) performed: None Procedures Critical Care performed:  None ____________________________________________   INITIAL IMPRESSION / ASSESSMENT AND PLAN / ED COURSE   83 y.o. male with h/o ICH, sick sinus syndrome, afib on Eliquis, HTN, HLD, dementia who presents for evaluation of syncope. Patient feels back to his baseline. Prodrome of feeling hot and diaphoretic, and lightheaded right before syncope which could be due to vasovagal versus orthostasis. Patient denies palpitations, chest pain, EKG with no evidence of ischemia, will monitor on telemetry for signs of dysrrhythmias. He is neurologically intact however review of electronic medical record shows the patient has intracerebral AVM malformations with prior episodes of the ICH and is currently on Eliquis therefore I will send patient for head CT to rule out an acute hemorrhagic stroke. Patient is neurologically intact at this time and has no headache. Will check orthostatics, will give IVF. Will check labs to rule out anemia, electrolyte  abnormalities, dehydration. Will check UA to rule out infection.    _________________________ 3:59 PM on 11/29/2016 -----------------------------------------  Patient received IV fluids, feels markedly improved. Resolution of orthostasis. UA concerning for hematuria. Patient has no dysuria and no flank pain. We'll refer him to urology for evaluation of painless hematuria. Discussed with the patient he needs to be evaluated to rule out malignancy. Remaining of patient's blood work with no evidence of anemia, dehydration. Head CT with no evidence of intracranial hemorrhage.   As part of my medical decision making, I reviewed the following data within the electronic MEDICAL RECORD NUMBER History obtained from family, Old EKG reviewed, Old chart reviewed, Notes from prior ED visits and [place] Controlled Substance Database    Pertinent labs & imaging results that were available during my care of the patient were reviewed by me and considered in my medical decision making (see chart for details).    ____________________________________________   FINAL CLINICAL IMPRESSION(S) / ED DIAGNOSES  Final diagnoses:  Syncope, unspecified syncope type  Dehydration  Hematuria, unspecified type  Orthostasis      NEW MEDICATIONS STARTED DURING THIS VISIT:  New Prescriptions   No medications on file     Note:  This document was prepared using Dragon voice recognition software and may include unintentional dictation errors.    Don Perking, [place], MD 11/29/16 1600

## 2016-11-29 NOTE — ED Notes (Signed)
Patient transported to CT 

## 2016-11-29 NOTE — Discharge Instructions (Signed)
Follow up with Urology for evaluation of blood in your urine. Follow up with your doctor for evaluation of your passing out episode. Drink lots of fluids, avoid heat for the next 24 hours. Return to the ER for chest pain, shortness of breath, headache, facial droop, slurred speech, unilateral weakness or numbness, or any new symptoms concerning to you.

## 2016-11-29 NOTE — ED Notes (Signed)
Attempt X 2 by this RN for PIV insertion unsuccessful.

## 2016-11-29 NOTE — ED Notes (Signed)
MD Veronese at bedside  

## 2016-11-29 NOTE — ED Notes (Signed)
Resumed care from kim rn.  Pt alert.  Pt denies any pain,   Family with pt.

## 2016-11-30 ENCOUNTER — Other Ambulatory Visit: Payer: Self-pay

## 2016-11-30 DIAGNOSIS — R310 Gross hematuria: Secondary | ICD-10-CM

## 2016-12-01 ENCOUNTER — Other Ambulatory Visit
Admission: RE | Admit: 2016-12-01 | Discharge: 2016-12-01 | Disposition: A | Discharge status: Home / Self Care | Payer: Medicare Other | Source: Ambulatory Visit | Attending: Urology | Admitting: Urology

## 2016-12-01 ENCOUNTER — Ambulatory Visit (INDEPENDENT_AMBULATORY_CARE_PROVIDER_SITE_OTHER): Payer: Medicare Other | Admitting: Urology

## 2016-12-01 ENCOUNTER — Encounter: Payer: Self-pay | Admitting: Urology

## 2016-12-01 VITALS — BP 118/61 | HR 88 | Ht 67.0 in | Wt 153.0 lb

## 2016-12-01 DIAGNOSIS — N400 Enlarged prostate without lower urinary tract symptoms: Secondary | ICD-10-CM | POA: Diagnosis not present

## 2016-12-01 DIAGNOSIS — R310 Gross hematuria: Secondary | ICD-10-CM | POA: Diagnosis present

## 2016-12-01 DIAGNOSIS — R3129 Other microscopic hematuria: Secondary | ICD-10-CM

## 2016-12-01 DIAGNOSIS — N200 Calculus of kidney: Secondary | ICD-10-CM | POA: Diagnosis not present

## 2016-12-01 DIAGNOSIS — I639 Cerebral infarction, unspecified: Secondary | ICD-10-CM

## 2016-12-01 LAB — URINALYSIS, COMPLETE (UACMP) WITH MICROSCOPIC
BILIRUBIN URINE: NEGATIVE
Glucose, UA: NEGATIVE mg/dL
Hgb urine dipstick: NEGATIVE
KETONES UR: NEGATIVE mg/dL
LEUKOCYTES UA: NEGATIVE
Nitrite: NEGATIVE
Protein, ur: NEGATIVE mg/dL
RBC / HPF: NONE SEEN RBC/hpf (ref 0–5)
SPECIFIC GRAVITY, URINE: 1.015 (ref 1.005–1.030)
pH: 5 (ref 5.0–8.0)

## 2016-12-01 NOTE — Progress Notes (Signed)
12/01/2016 7:15 PM   William Pigeon Sr. 1932/07/08 [id number]  Referring provider: Marguarite Arbour, MD [address] [facility] [place], [place] [address]  Chief Complaint  Patient presents with  . Hematuria    ER follow up    HPI: 83 year old male well known to [facility] who was seen on 11/29/2016 with a syncopal episode and seizure-like activity. Following this, he developed microscopic hematuria seen on urinalysis and advised to follow up with urology. He denies any gross hematuria, dysuria, or any other changes at his baseline urinary symptoms.  He is on chronic anticoagulation in the form of Krystal Eaton for A. fib/sick sinus.  He does have a personal history of recurrent nephrolithiasis. He last underwent ureteroscopy in 03/2016 at which time he underwent cystoscopy revealed an enlarged prostate, trilobar coaptation with an elevated bladder neck. He also underwent bilateral retrograde pyelograms which showed no evidence of filling defects or malignancy. He was treated for stones.  Most recently, he was seen by Dr. Hadley Pen and underwent 24 urine metabolic workup. This revealed primarily low urinary volume as well as low urinary pH, supersaturation of uric acid, and low urinary citrate.  He was started on Uricit K with plans for follow-up in November for repeat 24 hour urine.   PMH: Past Medical History:  Diagnosis Date  . Arthritis   . Atrial fibrillation (HCC) 2016   paroxysmal atrial fibrillation, sick sinus syndrome  . Complication of anesthesia    affects memory for a longer time after  . Dementia    alzheimers  . Essential hypertension   . GERD (gastroesophageal reflux disease)   . History of kidney stones   . Hyperlipemia   . Inguinal hernia recurrent bilateral   . Kidney stones   . Stroke [facility])     Surgical History: Past Surgical History:  Procedure Laterality Date  . CYSTOSCOPY W/ RETROGRADES Bilateral 03/27/2016   Procedure: CYSTOSCOPY WITH RETROGRADE PYELOGRAM;  Surgeon: Vanna Scotland, MD;  Location: [facility];  Service: Urology;  Laterality: Bilateral;  . CYSTOSCOPY W/ URETERAL STENT PLACEMENT Bilateral 02/24/2015   Procedure: CYSTOSCOPY WITH STENT REPLACEMENT;  Surgeon: Hildred Laser, MD;  Location: [facility];  Service: Urology;  Laterality: Bilateral;  . CYSTOSCOPY W/ URETERAL STENT PLACEMENT Left 04/11/2016   Procedure: CYSTOSCOPY WITH STENT REPLACEMENT;  Surgeon: Vanna Scotland, MD;  Location: [facility];  Service: Urology;  Laterality: Left;  . CYSTOSCOPY W/ URETERAL STENT REMOVAL Right 04/11/2016   Procedure: CYSTOSCOPY WITH STENT REMOVAL;  Surgeon: Vanna Scotland, MD;  Location: [facility];  Service: Urology;  Laterality: Right;  . CYSTOSCOPY WITH RETROGRADE PYELOGRAM, URETEROSCOPY AND STENT PLACEMENT Bilateral 12/10/2014   Procedure: CYSTOSCOPY WITH RETROGRADE PYELOGRAM, URETEROSCOPY AND STENT PLACEMENT;  Surgeon: Hildred Laser, MD;  Location: [facility];  Service: Urology;  Laterality: Bilateral;  . CYSTOSCOPY WITH STENT PLACEMENT Bilateral 12/10/2014   Procedure: CYSTOSCOPY WITH STENT PLACEMENT;  Surgeon: Hildred Laser, MD;  Location: [facility];  Service: Urology;  Laterality: Bilateral;  . CYSTOSCOPY WITH STENT PLACEMENT Bilateral 03/27/2016   Procedure: CYSTOSCOPY WITH STENT PLACEMENT;  Surgeon: Vanna Scotland, MD;  Location: [facility];  Service: Urology;  Laterality: Bilateral;  . EXTRACORPOREAL SHOCK WAVE LITHOTRIPSY Right 01/07/2015   Procedure: EXTRACORPOREAL SHOCK WAVE LITHOTRIPSY (ESWL);  Surgeon: Lorraine Lax, MD;  Location: [facility];  Service: Urology;  Laterality: Right;  . EXTRACORPOREAL SHOCK WAVE LITHOTRIPSY Left 01/21/2015   Procedure: EXTRACORPOREAL SHOCK WAVE LITHOTRIPSY (ESWL);  Surgeon: Hildred Laser,  MD;  Location: [facility];  Service: Urology;  Laterality: Left;  . EYE SURGERY Bilateral    Cataract Extraction with IOL  . HERNIA REPAIR Bilateral    inguinal  .  URETEROSCOPY WITH HOLMIUM LASER LITHOTRIPSY Bilateral 02/24/2015   Procedure: URETEROSCOPY WITH HOLMIUM LASER LITHOTRIPSY /STONE BASKETING;  Surgeon: Hildred Laser, MD;  Location: [facility];  Service: Urology;  Laterality: Bilateral;  . URETEROSCOPY WITH HOLMIUM LASER LITHOTRIPSY Bilateral 04/11/2016   Procedure: URETEROSCOPY WITH HOLMIUM LASER LITHOTRIPSY;  Surgeon: Vanna Scotland, MD;  Location: [facility];  Service: Urology;  Laterality: Bilateral;    Home Medications:  Allergies as of 12/01/2016      Reactions   Codeine Other (See Comments)   Causes side effects   Penicillin G Rash   Has patient had a PCN reaction causing immediate rash, facial/tongue/throat swelling, SOB or lightheadedness with hypotension: Yes Has patient had a PCN reaction causing severe rash involving mucus membranes or skin necrosis: No Has patient had a PCN reaction that required hospitalization: No Has patient had a PCN reaction occurring within the last 10 years: No If all of the above answers are "NO", then may proceed with Cephalosporin use.   Sulfa Antibiotics Rash      Medication List       Accurate as of 12/01/16 11:59 PM. Always use your most recent med list.          apixaban 2.5 MG Tabs tablet Commonly known as:  ELIQUIS Take 2.5 mg by mouth 2 (two) times daily.   hydrochlorothiazide 25 MG tablet Commonly known as:  HYDRODIURIL Take 12.5 mg by mouth daily.   metoprolol succinate 25 MG 24 hr tablet Commonly known as:  TOPROL-XL Take 25 mg by mouth daily.   ondansetron 4 MG disintegrating tablet Commonly known as:  ZOFRAN ODT Take 1 tablet (4 mg total) by mouth every 4 (four) hours as needed for nausea or vomiting.   pantoprazole 40 MG tablet Commonly known as:  PROTONIX Take 40 mg by mouth daily.   rivastigmine 3 MG capsule Commonly known as:  EXELON Take 3 mg by mouth 2 (two) times daily.   sertraline 25 MG tablet Commonly known as:  ZOLOFT Take 25 mg by mouth daily.     simvastatin 20 MG tablet Commonly known as:  ZOCOR Take 20 mg by mouth daily.       Allergies:  Allergies  Allergen Reactions  . Codeine Other (See Comments)    Causes side effects  . Penicillin G Rash    Has patient had a PCN reaction causing immediate rash, facial/tongue/throat swelling, SOB or lightheadedness with hypotension: Yes Has patient had a PCN reaction causing severe rash involving mucus membranes or skin necrosis: No Has patient had a PCN reaction that required hospitalization: No Has patient had a PCN reaction occurring within the last 10 years: No If all of the above answers are "NO", then may proceed with Cephalosporin use.  . Sulfa Antibiotics Rash    Family History: Family History  Problem Relation Age of Onset  . Heart attack Mother   . Heart attack Father   . Hypertension Unknown   . Kidney disease Neg Hx   . Prostate cancer Neg Hx     Social History:  reports that he has never smoked. He has never used smokeless tobacco. He reports that he does not drink alcohol or use drugs.  ROS: UROLOGY Frequent Urination?: No Hard to postpone urination?: No Burning/pain with urination?: No Get  up at night to urinate?: No Leakage of urine?: No Urine stream starts and stops?: No Trouble starting stream?: No Do you have to strain to urinate?: No Blood in urine?: No Urinary tract infection?: No Sexually transmitted disease?: No Injury to kidneys or bladder?: No Painful intercourse?: No Weak stream?: No Erection problems?: No Penile pain?: No  Gastrointestinal Nausea?: No Vomiting?: No Indigestion/heartburn?: No Diarrhea?: No Constipation?: No  Constitutional Fever: No Night sweats?: No Weight loss?: No Fatigue?: No  Skin Skin rash/lesions?: No Itching?: No  Eyes Blurred vision?: No Double vision?: No  Ears/Nose/Throat Sore throat?: No Sinus problems?: No  Hematologic/Lymphatic Swollen glands?: No Easy bruising?:  No  Cardiovascular Leg swelling?: No Chest pain?: No  Respiratory Cough?: No Shortness of breath?: No  Endocrine Excessive thirst?: No  Musculoskeletal Back pain?: No Joint pain?: No  Neurological Headaches?: No Dizziness?: Yes  Psychologic Depression?: No Anxiety?: No  Physical Exam: BP 118/61   Pulse 88   Ht  (1.702 m)   Wt 153 lb (69.4 kg)   BMI 23.96 kg/m   Constitutional:  Alert and oriented, No acute distress.    Family members today. HEENT: [place] AT, moist mucus membranes.  Trachea midline, no masses. Cardiovascular: No clubbing, cyanosis, or edema. Respiratory: Normal respiratory effort, no increased work of breathing. GI: Abdomen is soft, nontender, nondistended, no abdominal masses GU: No CVA tenderness.  Skin: No rashes, bruises or suspicious lesions.. Neurologic: Grossly intact, no focal deficits, moving all 4 extremities. Psychiatric: Normal mood and affect.  Laboratory Data: Lab Results  Component Value Date   WBC 12.7 (H) 11/29/2016   HGB 16.8 11/29/2016   HCT 49.6 11/29/2016   MCV 90.5 11/29/2016   PLT 332 11/29/2016    Lab Results  Component Value Date   CREATININE 1.44 (H) 11/29/2016    Lab Results  Component Value Date   HGBA1C 6.9 (H) 07/08/2016    Urinalysis Lab Results  Component Value Date   SPECGRAV 1.025 04/18/2016   PHUR 5.0 04/18/2016   COLORU Amber (A) 04/18/2016   APPEARANCEUR CLEAR 12/01/2016   LEUKOCYTESUR NEGATIVE 12/01/2016   PROTEINUR NEGATIVE 12/01/2016   GLUCOSEU NEGATIVE 12/01/2016   KETONESU Negative 04/18/2016   RBCU NONE SEEN 12/01/2016   BILIRUBINUR NEGATIVE 12/01/2016   UUROB 0.2 04/18/2016   NITRITE NEGATIVE 12/01/2016    Pertinent Imaging: Results for orders placed during the hospital encounter of 07/20/16  Abdomen 1 view (KUB)    Results for orders placed during the hospital encounter of 12/09/14  CT RENAL STONE STUDY   Narrative CLINICAL DATA:  Back and abdominal pain for 1 week.  Worsening tonight with vomiting and nausea.  EXAM: CT ABDOMEN AND PELVIS WITHOUT CONTRAST  TECHNIQUE: Multidetector CT imaging of the abdomen and pelvis was performed following the standard protocol without IV contrast.  COMPARISON:  CT 07/25/2013  FINDINGS: Lower chest: Linear atelectasis in the left lower lobe. The heart is mildly enlarged.  Liver: No evidence focal lesion allowing for lack contrast.  Hepatobiliary: Gallbladder physiologically distended, no calcified stone. No evidence of biliary dilatation.  Pancreas: No ductal dilatation or surrounding inflammation.  Spleen: Normal.  Adrenal glands: No nodule.  Mild fullness bilaterally.  Kidneys: There is moderate bilateral hydroureteronephrosis and perinephric strandy. On the right this secondary to a 11 x 9 mm stone in the right mid ureter at the level of L3-L4. On the left this is secondary to 9 x 8 mm stone in the left mid distal ureter at the level  of L5-S1. Additional nonobstructing stones in both kidneys. There is a cyst in the lower left kidney, additional small bilateral renal cysts and not well seen.  Stomach/Bowel: Stomach mildly distended with ingested contents. Small hiatal hernia. There are no dilated or thickened small bowel loops. Small volume of stool throughout the colon without colonic wall thickening. The colon is tortuous. The appendix is normal.  Vascular/Lymphatic: No retroperitoneal adenopathy. Abdominal aorta is normal in caliber. Moderate to dense atherosclerosis without aneurysm.  Reproductive: Prostate gland is enlarged measuring 5.4 cm transverse dimension.  Bladder: Minimally distended.  No wall thickening.  Other: No free air, free fluid, or intra-abdominal fluid collection. Post left inguinal hernia repair with mesh. There is fat in the right inguinal canal.  Musculoskeletal: There are no acute or suspicious osseous abnormalities. Stable degenerative change in the spine.  Stable mild compression deformity superior endplate of L1.  IMPRESSION: 1. Bilateral obstructive uropathy with moderate bilateral hydroureteronephrosis. Large mid ureteral obstructing stones, 11 mm on the right and 9 mm on the left. Recommend urologic consultation. 2. Additional nonobstructing stones in both kidneys.   Electronically Signed   By: Rubye Oaks M.D.   On: 12/09/2014 23:26     Assessment & Plan:   1. Microscopic hematuria Incidental microscopic hematuria after fall, may be related to fall, kidney stones, BPH on anticoagulation amongst others.  Recent cystoscopy/upper tract imaging of the form of retrograde pyelogram, cross-sectional imaging without concern for underlying malignancy. Given that this is all performed within the same calendar year, I do not feel that this needs to be repeated at this time. Patient was reassured.  2. Recurrent nephrolithiasis Follow up as scheduled in November with repeat 24-hour urine  3. Benign prostatic hyperplasia without lower urinary tract symptoms Relatively asymptomatic, will follow clinically   Vanna Scotland, MD  [facility] [address], [address] [place], [place] [address] [phone]

## 2016-12-27 IMAGING — CT CT RENAL STONE PROTOCOL
2 of 4 series · 16 of 46 positions shown, 18 images · non-contrast
Comparison: CT 07/25/2013

CLINICAL DATA: Back and abdominal pain for 1 week. Worsening
tonight with vomiting and nausea.

EXAM:
CT ABDOMEN AND PELVIS WITHOUT CONTRAST
TECHNIQUE: Multidetector CT imaging of the abdomen and pelvis was performed
following the standard protocol without IV contrast.

[Series 2: stone standard full · axial · 0.68mm/px · z∈[-536,-156]mm · 13 of 84 slices shown, 15 images]
[im 4/84  soft-tissue]
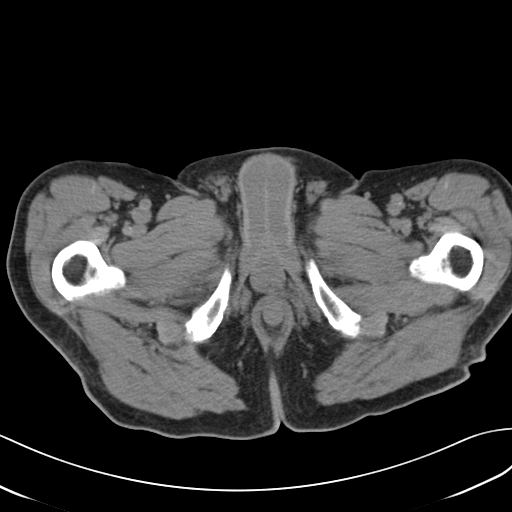
[im 4/84  bone]
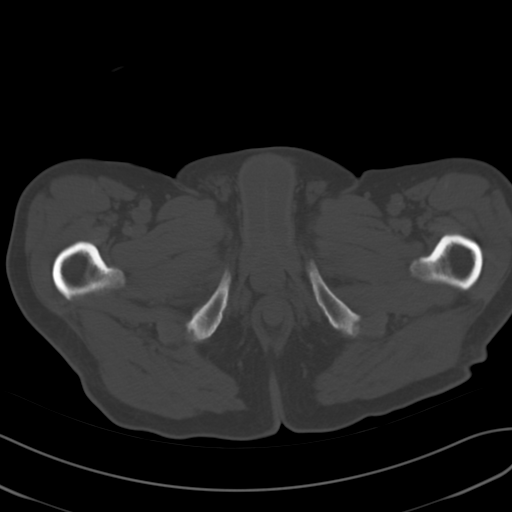
[im 11/84  soft-tissue]
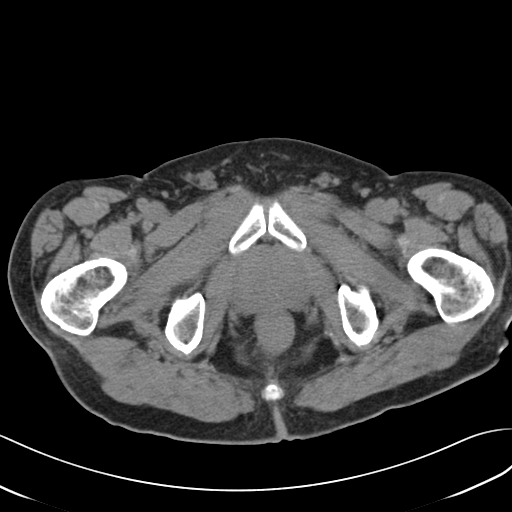
[im 18/84  soft-tissue]
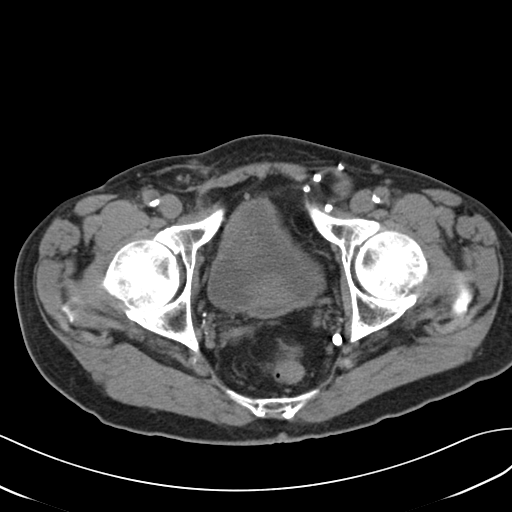
[im 25/84  soft-tissue]
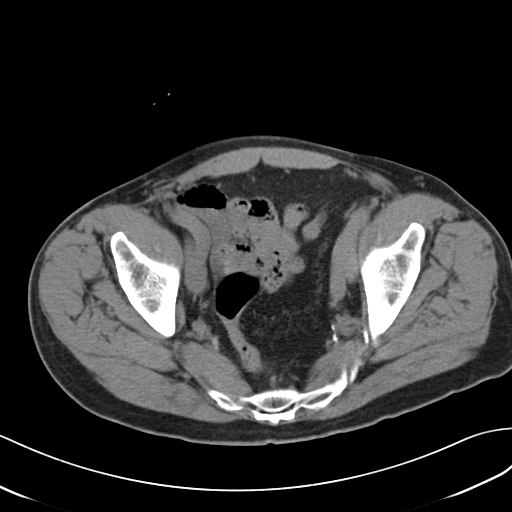
[im 28/84  soft-tissue]
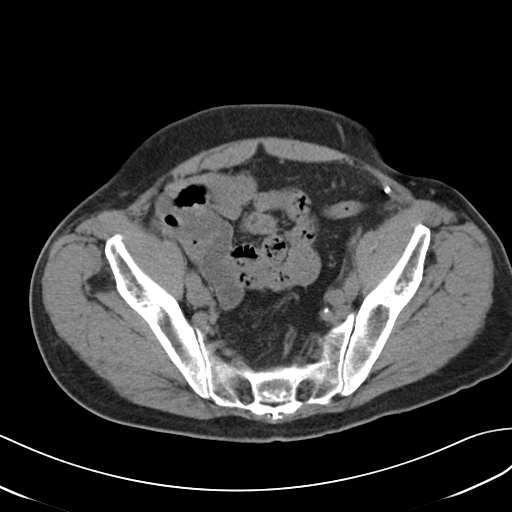
[im 35/84  soft-tissue]
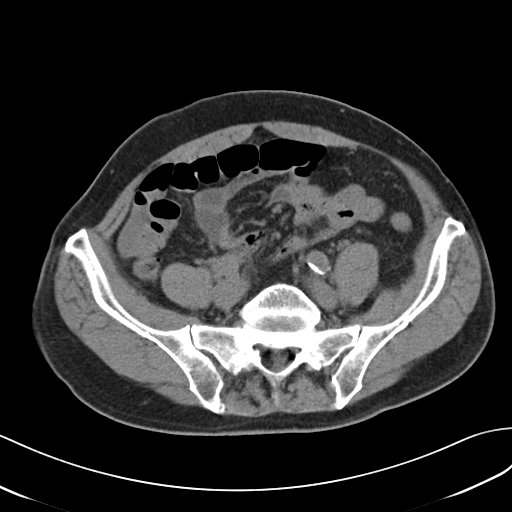
[im 42/84  soft-tissue]
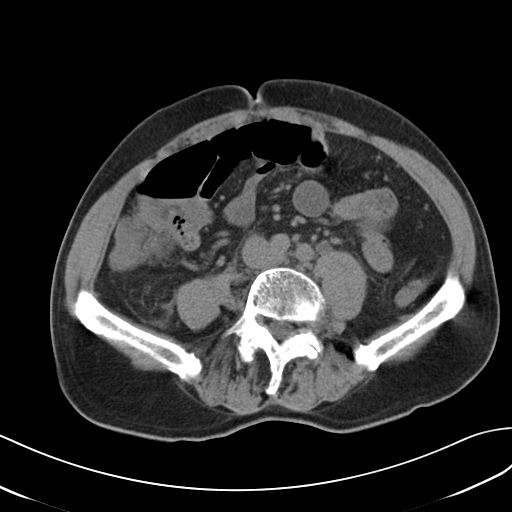
[im 49/84  soft-tissue]
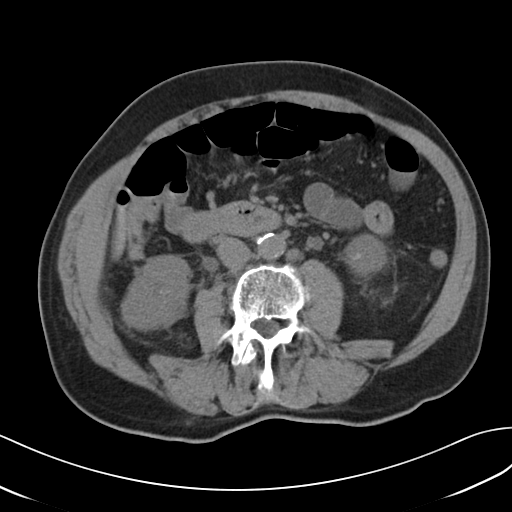
[im 56/84  soft-tissue]
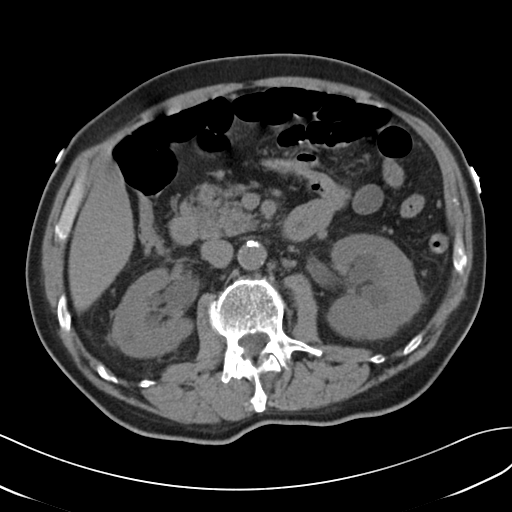
[im 56/84  bone]
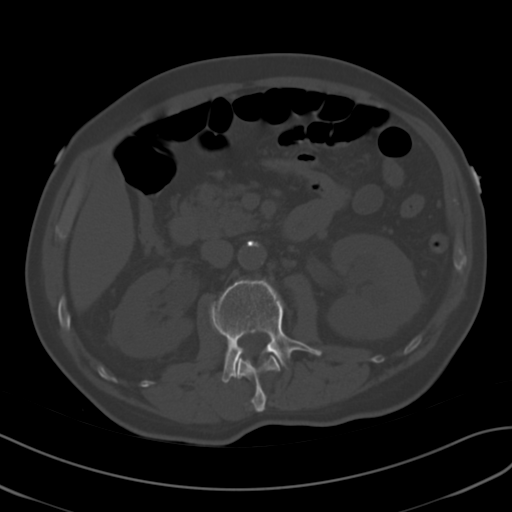
[im 59/84  soft-tissue]
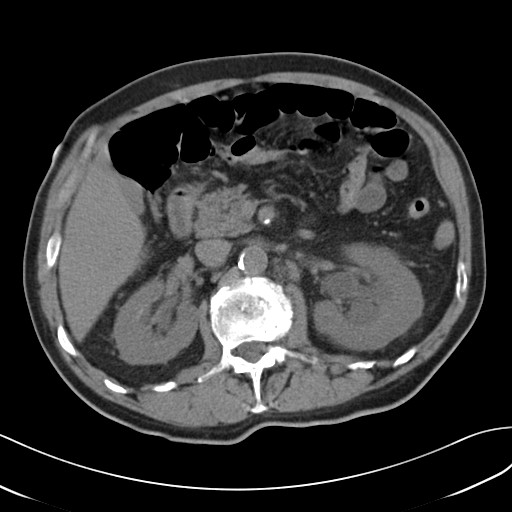
[im 66/84  soft-tissue]
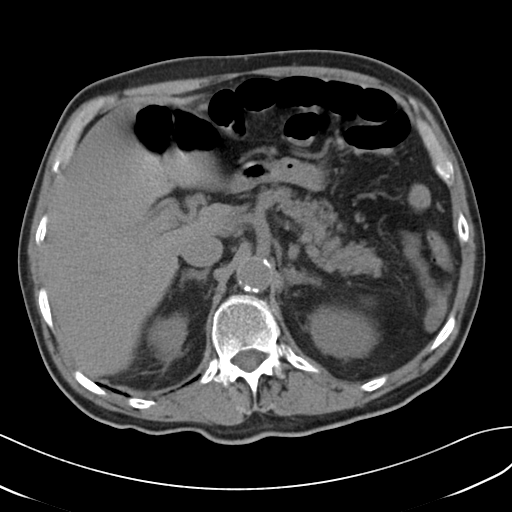
[im 73/84  soft-tissue]
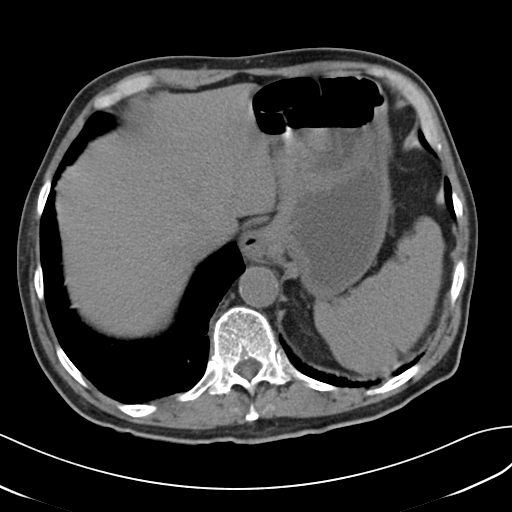
[im 80/84  soft-tissue]
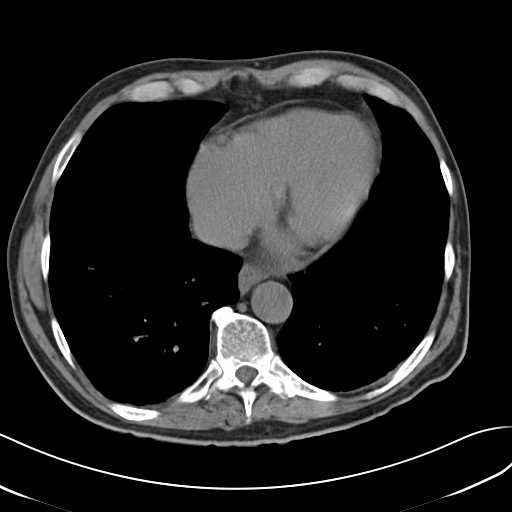

[Series 5: cor stone standard full · coronal · 0.92mm/px · 3 of 140 slices shown]
[im 47/140  soft-tissue]
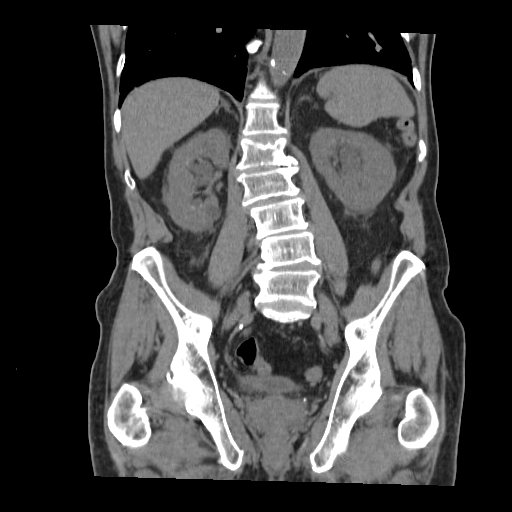
[im 62/140  soft-tissue]
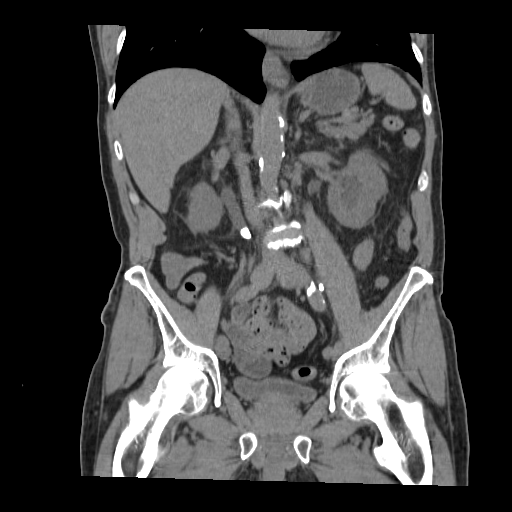
[im 78/140  soft-tissue]
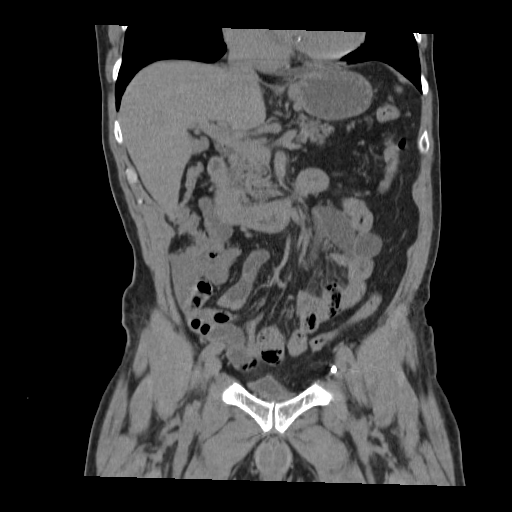

[16 of 46 positions shown; findings below may reference images not displayed]

FINDINGS: Lower chest: Linear atelectasis in the left lower lobe. The heart is
mildly enlarged.

Liver: No evidence focal lesion allowing for lack contrast.

Hepatobiliary: Gallbladder physiologically distended, no calcified
stone. No evidence of biliary dilatation.

Pancreas: No ductal dilatation or surrounding inflammation.

Spleen: Normal.

Adrenal glands: No nodule.  Mild fullness bilaterally.

Kidneys: There is moderate bilateral hydroureteronephrosis and
perinephric strandy. On the right this secondary to a 11 x 9 mm
stone in the right mid ureter at the level of L3-L4. On the left
this is secondary to 9 x 8 mm stone in the left mid distal ureter at
the level of L5-S1. Additional nonobstructing stones in both
kidneys. There is a cyst in the lower left kidney, additional small
bilateral renal cysts and not well seen.

Stomach/Bowel: Stomach mildly distended with ingested contents.
Small hiatal hernia. There are no dilated or thickened small bowel
loops. Small volume of stool throughout the colon without colonic
wall thickening. The colon is tortuous. The appendix is normal.

Vascular/Lymphatic: No retroperitoneal adenopathy. Abdominal aorta
is normal in caliber. Moderate to dense atherosclerosis without
aneurysm.

Reproductive: Prostate gland is enlarged measuring 5.4 cm transverse
dimension.

Bladder: Minimally distended.  No wall thickening.

Other: No free air, free fluid, or intra-abdominal fluid collection.
Post left inguinal hernia repair with mesh. There is fat in the
right inguinal canal.

Musculoskeletal: There are no acute or suspicious osseous
abnormalities. Stable degenerative change in the spine. Stable mild
compression deformity superior endplate of L1.
IMPRESSION: 1. Bilateral obstructive uropathy with moderate bilateral
hydroureteronephrosis. Large mid ureteral obstructing stones, 11 mm
on the right and 9 mm on the left. Recommend urologic consultation.
2. Additional nonobstructing stones in both kidneys.

## 2016-12-28 ENCOUNTER — Encounter: Payer: Self-pay | Admitting: Emergency Medicine

## 2016-12-28 ENCOUNTER — Emergency Department
Admission: EM | Admit: 2016-12-28 | Discharge: 2016-12-28 | Disposition: A | Discharge status: Home / Self Care | Payer: Medicare Other | Attending: Emergency Medicine | Admitting: Emergency Medicine

## 2016-12-28 ENCOUNTER — Other Ambulatory Visit: Payer: Self-pay

## 2016-12-28 DIAGNOSIS — I10 Essential (primary) hypertension: Secondary | ICD-10-CM | POA: Diagnosis not present

## 2016-12-28 DIAGNOSIS — Z79899 Other long term (current) drug therapy: Secondary | ICD-10-CM | POA: Insufficient documentation

## 2016-12-28 DIAGNOSIS — G301 Alzheimer's disease with late onset: Secondary | ICD-10-CM | POA: Diagnosis not present

## 2016-12-28 DIAGNOSIS — Z8673 Personal history of transient ischemic attack (TIA), and cerebral infarction without residual deficits: Secondary | ICD-10-CM | POA: Diagnosis not present

## 2016-12-28 DIAGNOSIS — R42 Dizziness and giddiness: Secondary | ICD-10-CM | POA: Diagnosis present

## 2016-12-28 DIAGNOSIS — F0280 Dementia in other diseases classified elsewhere without behavioral disturbance: Secondary | ICD-10-CM | POA: Diagnosis not present

## 2016-12-28 DIAGNOSIS — E119 Type 2 diabetes mellitus without complications: Secondary | ICD-10-CM | POA: Insufficient documentation

## 2016-12-28 LAB — CBC
HEMATOCRIT: 51.8 % (ref 40.0–52.0)
HEMOGLOBIN: 17.1 g/dL (ref 13.0–18.0)
MCH: 30.5 pg (ref 26.0–34.0)
MCHC: 33.1 g/dL (ref 32.0–36.0)
MCV: 92.2 fL (ref 80.0–100.0)
Platelets: 264 10*3/uL (ref 150–440)
RBC: 5.62 MIL/uL (ref 4.40–5.90)
RDW: 14.6 % — ABNORMAL HIGH (ref 11.5–14.5)
WBC: 10.8 10*3/uL — AB (ref 3.8–10.6)

## 2016-12-28 LAB — URINALYSIS, COMPLETE (UACMP) WITH MICROSCOPIC
BILIRUBIN URINE: NEGATIVE
Bacteria, UA: NONE SEEN
GLUCOSE, UA: NEGATIVE mg/dL
HGB URINE DIPSTICK: NEGATIVE
KETONES UR: NEGATIVE mg/dL
Leukocytes, UA: NEGATIVE
NITRITE: NEGATIVE
PH: 5 (ref 5.0–8.0)
PROTEIN: NEGATIVE mg/dL
Specific Gravity, Urine: 1.012 (ref 1.005–1.030)

## 2016-12-28 LAB — BASIC METABOLIC PANEL
ANION GAP: 13 (ref 5–15)
BUN: 27 mg/dL — ABNORMAL HIGH (ref 6–20)
CO2: 25 mmol/L (ref 22–32)
Calcium: 9.7 mg/dL (ref 8.9–10.3)
Chloride: 100 mmol/L — ABNORMAL LOW (ref 101–111)
Creatinine, Ser: 1.35 mg/dL — ABNORMAL HIGH (ref 0.61–1.24)
GFR, EST AFRICAN AMERICAN: 55 mL/min — AB (ref >60)
GFR, EST NON AFRICAN AMERICAN: 47 mL/min — AB (ref >60)
GLUCOSE: 162 mg/dL — AB (ref 65–99)
POTASSIUM: 4.2 mmol/L (ref 3.5–5.1)
Sodium: 138 mmol/L (ref 135–145)

## 2016-12-28 LAB — TROPONIN I

## 2016-12-28 MED ORDER — MECLIZINE HCL 25 MG PO TABS: 25.0000 mg | ORAL_TABLET | Freq: Three times a day (TID) | ORAL | 0 refills | Status: DC | PRN

## 2016-12-28 NOTE — ED Provider Notes (Signed)
[facility] Emergency Department Provider Note ____________________________________________   I have reviewed the triage vital signs and the triage nursing note.  HISTORY  Chief Complaint Dizziness   Historian Patient, caregiver with whom he lives and brother  HPI Illene RegulusJames B Howes Sr. is a 83 y.o. male who lives with a caregiver, has a history of dementia, hemorrhagic stroke in the spring of this year, taken off of Eliquis at that point, has had some episodes with lightheadedness which she calls dizziness, but no focal weakness or numbness as a result over the last several months.  He has been evaluated by neurology, most recent visit this past week, patient was recommended to follow-up with ENT for possible vertigo.  He is not on any medication for vertigo.  He is on Keppra which is a new medication for possible seizure activity, and his sertraline was just recently increased from once daily to twice daily.  Caregiver and patient described that when he wakes up he feels very lightheaded and this affects his ability to feel stable standing up and walking around in the mornings.  It typically lasts 30 minutes to an hour, but occasionally will last all the way till noon.  Symptoms have been ongoing for about a month or so.  He has seen the neurology for this already.  Symptoms are currently gone.  He has had no coordination issues, weakness focally or numbness focally.  Denies urinary symptoms.  Denies chest pain or palpitations.  Without fail, it seems like the symptoms always happen when he wakes up in the morning and are gone by noon.   Past Medical History:  Diagnosis Date  . Arthritis   . Atrial fibrillation (HCC) 2016   paroxysmal atrial fibrillation, sick sinus syndrome  . Complication of anesthesia    affects memory for a longer time after  . Dementia    alzheimers  . Essential hypertension   . GERD (gastroesophageal reflux disease)   . History of  kidney stones   . Hyperlipemia   . Inguinal hernia recurrent bilateral   . Kidney stones   . Stroke [facility](HCC)     Patient Active Problem List   Diagnosis Date Noted  . Dizziness 07/07/2016  . Urolithiasis 03/28/2016  . Pyuria 03/28/2016  . Hydronephrosis 03/27/2016  . Cardiac arrhythmia 07/22/2015  . Atrial fibrillation with RVR (HCC) 02/24/2015  . Cardiac conduction disorder 02/04/2015  . Arthritis 02/04/2015  . Atrial fibrillation (HCC) 02/04/2015  . Acid reflux 02/04/2015  . H/O vertigo 02/04/2015  . HLD (hyperlipidemia) 02/04/2015  . BP (high blood pressure) 02/04/2015  . Calculus of kidney 02/04/2015  . Sick sinus syndrome (HCC) 02/04/2015  . Fungal infection of nail 02/04/2015  . Type 2 diabetes mellitus (HCC) 02/04/2015  . Sepsis (HCC) 12/10/2014  . Systemic infection (HCC) 12/10/2014  . Sepsis(995.91) 12/10/2014  . Altered bowel function 10/13/2014  . Chronic diarrhea 10/13/2014  . Change in bowel habits 10/13/2014  . Senile dementia of Alzheimer's type 09/20/2014  . Alzheimer's dementia, late onset 09/20/2014  . Drug resistance 10/22/2013    Past Surgical History:  Procedure Laterality Date  . EYE SURGERY Bilateral    Cataract Extraction with IOL  . HERNIA REPAIR Bilateral    inguinal    Prior to Admission medications   Medication Sig Start Date End Date Taking? Authorizing Provider  hydrochlorothiazide (HYDRODIURIL) 25 MG tablet Take 12.5 mg by mouth daily.  04/12/16   [provider]  meclizine (ANTIVERT) 25 MG tablet Take  1 tablet (25 mg total) 3 (three) times daily as needed by mouth for dizziness or nausea. 12/28/16   Governor RooksLord, Markan Cazarez, MD  metoprolol succinate (TOPROL-XL) 25 MG 24 hr tablet Take 25 mg by mouth daily.    [provider]  ondansetron (ZOFRAN ODT) 4 MG disintegrating tablet Take 1 tablet (4 mg total) by mouth every 4 (four) hours as needed for nausea or vomiting. 04/12/16   Marvel PlanMcGowan, Carollee HerterShannon A, PA-C  pantoprazole (PROTONIX) 40 MG  tablet Take 40 mg by mouth daily.    [provider]  rivastigmine (EXELON) 3 MG capsule Take 3 mg by mouth 2 (two) times daily.     [provider]  sertraline (ZOLOFT) 25 MG tablet Take 25 mg by mouth daily.    [provider]  simvastatin (ZOCOR) 20 MG tablet Take 20 mg by mouth daily.    [provider]  Not on Eliquis.  Allergies  Allergen Reactions  . Codeine Other (See Comments)    Causes side effects  . Penicillin G Rash    Has patient had a PCN reaction causing immediate rash, facial/tongue/throat swelling, SOB or lightheadedness with hypotension: Yes Has patient had a PCN reaction causing severe rash involving mucus membranes or skin necrosis: No Has patient had a PCN reaction that required hospitalization: No Has patient had a PCN reaction occurring within the last 10 years: No If all of the above answers are "NO", then may proceed with Cephalosporin use.  . Sulfa Antibiotics Rash    Pt and fam are saying he does not have allergy to sulfa now.    Family History  Problem Relation Age of Onset  . Heart attack Mother   . Heart attack Father   . Hypertension Unknown   . Kidney disease Neg Hx   . Prostate cancer Neg Hx     Social History Social History   Tobacco Use  . Smoking status: Never Smoker  . Smokeless tobacco: Never Used  Substance Use Topics  . Alcohol use: No    Alcohol/week: 0.0 oz  . Drug use: No    Review of Systems  Constitutional: Negative for fever. Eyes: Negative for visual changes. ENT: Negative for sore throat. Cardiovascular: Negative for chest pain. Respiratory: Negative for shortness of breath. Gastrointestinal: Negative for abdominal pain, vomiting and diarrhea. Genitourinary: Negative for dysuria. Musculoskeletal: Negative for back pain. Skin: Negative for rash. Neurological: Negative for headache.  ____________________________________________   PHYSICAL EXAM:  VITAL SIGNS: ED Triage Vitals   Enc Vitals Group     BP 12/28/16 1337 130/85     Pulse Rate 12/28/16 1337 85     Resp 12/28/16 1337 12     Temp 12/28/16 1337 (!) 97.5 F (36.4 C)     Temp Source 12/28/16 1337 Oral     SpO2 12/28/16 1337 95 %     Weight 12/28/16 1338 153 lb (69.4 kg)     Height 12/28/16 1338 5\' 7"  (1.702 m)     Head Circumference --      Peak Flow --      Pain Score --      Pain Loc --      Pain Edu? --      Excl. in GC? --      Constitutional: Alert and oriented. Well appearing and in no distress. HEENT   Head: Normocephalic and atraumatic.      Eyes: Conjunctivae are normal. Pupils equal and round.       Ears:  Nose: No congestion/rhinnorhea.   Mouth/Throat: Mucous membranes are moist.   Neck: No stridor. Cardiovascular/Chest: Normal rate, regular rhythm.  No murmurs, rubs, or gallops. Respiratory: Normal respiratory effort without tachypnea nor retractions. Breath sounds are clear and equal bilaterally. No wheezes/rales/rhonchi. Gastrointestinal: Soft. No distention, no guarding, no rebound. Nontender.    Genitourinary/rectal:Deferred Musculoskeletal: Nontender with normal range of motion in all extremities. No joint effusions.  No lower extremity tenderness.  No edema. Neurologic: No facial droop.  Coordination intact.  Normal speech and language. No gross or focal neurologic deficits are appreciated. Skin:  Skin is warm, dry and intact. No rash noted. Psychiatric: Mood and affect are normal. Speech and behavior are normal. Patient exhibits appropriate insight and judgment.   ____________________________________________  LABS (pertinent positives/negatives) I, Governor Rooks, MD the attending physician have reviewed the labs noted below.  Labs Reviewed  BASIC METABOLIC PANEL - Abnormal; Notable for the following components:      Result Value   Chloride 100 (*)    Glucose, Bld 162 (*)    BUN 27 (*)    Creatinine, Ser 1.35 (*)    GFR calc non Af Amer 47 (*)    GFR  calc Af Amer 55 (*)    All other components within normal limits  CBC - Abnormal; Notable for the following components:   WBC 10.8 (*)    RDW 14.6 (*)    All other components within normal limits  URINALYSIS, COMPLETE (UACMP) WITH MICROSCOPIC - Abnormal; Notable for the following components:   Color, Urine YELLOW (*)    APPearance CLEAR (*)    Squamous Epithelial / LPF 0-5 (*)    All other components within normal limits  TROPONIN I    ____________________________________________    EKG I, Governor Rooks, MD, the attending physician have personally viewed and interpreted all ECGs.  106 bpm.  Appears to be atrial fibrillation.  There are crisp renal axis.  Occasional PVC.  Nonspecific ST and T wave. ____________________________________________  RADIOLOGY All Xrays were viewed by me.  Imaging interpreted by Radiologist, and I, Governor Rooks, MD the attending physician have reviewed the radiologist interpretation noted below.  None __________________________________________  PROCEDURES  Procedure(s) performed: None  Critical Care performed: None   ____________________________________________  ED COURSE / ASSESSMENT AND PLAN  Pertinent labs & imaging results that were available during my care of the patient were reviewed by me and considered in my medical decision making (see chart for details).    It sounds like the patient has had ongoing issues with dizziness, and that usually happens in the morning and is currently now gone.  It sounds like the patient and the caregiver are little frustrated that no certain cause has been found, after neurology appointment had referred patient to see ENT.  This is scheduled coming up.  No symptoms concerning for infections, although I did check urine, there is no sign for UTI.  He is not reporting any focal neurologic deficits, does not have any focal neurologic deficits on exam today.  We discussed his medications and that multiple  of his medications do have potential side effect for dizziness, however given that he just has episodes in the morning, I am not sure that it is related to medications.  We discussed the possibility of related to vertigo, and I will prescribe meclizine for him to try.  We also discussed that brother and caregiver feel that he does not drink very much.  Essentially acute renal failure, however potentially  he is to hydrate when he wakes up in the morning, and I have recommended that he drink a full glass of water in the morning to see if maybe the episodes do not last long.  Symptoms sound like they have been going on really as long as he has had his last CT which I reviewed from 11/29/2016.  He is not having focal neurologic symptoms and I do not suspect acute stroke or intracranial emergency.  I discussed this with the family and they are in agreement not to repeat head imaging at this point time.    CONSULTATIONS:   None   Patient / Family / Caregiver informed of clinical course, medical decision-making process, and agree with plan.   I discussed return precautions, follow-up instructions, and discharge instructions with patient and/or family.  Discharge Instructions : You are evaluated for morning dizziness, and your symptoms now are gone.  Your exam and evaluation are overall reassuring in the emergency we discussed potentially symptoms could be related to dehydration in the morning and make sure that you drink plenty fluids right in the morning.  It is also possible he could be having episodes of vertigo, for which you are referred to see the ear nose throat doctor as referred by your neurologist.  I am going to prescribe meclizine that you may try in the morning we have dizziness.  Return to the emerge department immediately for any new or worsening conditions including slurred speech, trouble finding words, weakness, numbness, facial droop, according to problems or any other symptoms concerning  to you.    ___________________________________________  ED Discharge Orders        Ordered    meclizine (ANTIVERT) 25 MG tablet  3 times daily PRN     12/28/16 1710      ___________________________________________   FINAL CLINICAL IMPRESSION(S) / ED DIAGNOSES   Final diagnoses:  Dizziness              Note: This dictation was prepared with Dragon dictation. Any transcriptional errors that result from this process are unintentional    Governor Rooks, MD 12/28/16 1713

## 2016-12-28 NOTE — ED Notes (Addendum)
Pt states that he has been having spells of dizziness over the last week. Reports that episode lasted longer today. Pt alert and oriented X4, active, cooperative, pt in NAD. RR even and unlabored, color WNL.  Denies dizziness at this time. Reports that the episodes are upon awakening in AM and last into the morning.

## 2016-12-28 NOTE — ED Triage Notes (Signed)
Pt has dizziness since we woke this am.  Has this a lot, but it doesn't usually last.  Also he has seen neuro recently and they want him checked for inner ear.  Has appt with ent

## 2016-12-28 NOTE — Discharge Instructions (Signed)
You are evaluated for morning dizziness, and your symptoms now are gone.  Your exam and evaluation are overall reassuring in the emergency we discussed potentially symptoms could be related to dehydration in the morning and make sure that you drink plenty fluids right in the morning.  It is also possible he could be having episodes of vertigo, for which you are referred to see the ear nose throat doctor as referred by your neurologist.  I am going to prescribe meclizine that you may try in the morning we have dizziness.  Return to the emerge department immediately for any new or worsening conditions including slurred speech, trouble finding words, weakness, numbness, facial droop, according to problems or any other symptoms concerning to you.

## 2016-12-28 NOTE — ED Notes (Signed)
Pt alert and oriented X4, active, cooperative, pt in NAD. RR even and unlabored, color WNL.  Pt informed to return if any life threatening symptoms occur.  Discharge and followup instructions reviewed.  

## 2017-01-02 ENCOUNTER — Other Ambulatory Visit: Payer: Medicare Other

## 2017-01-05 ENCOUNTER — Ambulatory Visit: Payer: Medicare Other | Admitting: Family Medicine

## 2017-01-15 ENCOUNTER — Other Ambulatory Visit: Payer: Self-pay | Admitting: Urology

## 2017-01-18 ENCOUNTER — Ambulatory Visit: Payer: Medicare Other

## 2017-01-19 ENCOUNTER — Ambulatory Visit: Payer: Medicare Other | Admitting: Urology

## 2017-03-02 ENCOUNTER — Ambulatory Visit
Admission: RE | Admit: 2017-03-02 | Discharge: 2017-03-02 | Disposition: A | Discharge status: Home / Self Care | Payer: Medicare Other | Source: Ambulatory Visit | Attending: Urology | Admitting: Urology

## 2017-03-02 ENCOUNTER — Encounter: Payer: Self-pay | Admitting: Urology

## 2017-03-02 ENCOUNTER — Other Ambulatory Visit
Admission: RE | Admit: 2017-03-02 | Discharge: 2017-03-02 | Disposition: A | Discharge status: Home / Self Care | Payer: Medicare Other | Source: Ambulatory Visit | Attending: Urology | Admitting: Urology

## 2017-03-02 ENCOUNTER — Ambulatory Visit (INDEPENDENT_AMBULATORY_CARE_PROVIDER_SITE_OTHER): Payer: Medicare Other | Admitting: Urology

## 2017-03-02 VITALS — BP 99/63 | HR 86 | Ht 67.0 in | Wt 154.0 lb

## 2017-03-02 DIAGNOSIS — Z87442 Personal history of urinary calculi: Secondary | ICD-10-CM | POA: Insufficient documentation

## 2017-03-02 DIAGNOSIS — R140 Abdominal distension (gaseous): Secondary | ICD-10-CM | POA: Diagnosis not present

## 2017-03-02 DIAGNOSIS — Z87448 Personal history of other diseases of urinary system: Secondary | ICD-10-CM

## 2017-03-02 NOTE — Progress Notes (Signed)
03/02/2017 2:09 PM   William Pigeon Sr. 1932/07/17 [id number]  Referring provider: Marguarite Arbour, MD [address] [facility] [place], [place] [address]  Chief Complaint  Patient presents with  . Nephrolithiasis    24 hour results    HPI: 84 year old male with a history of gross hematuria and nephrolithiasis who returns today for routine follow-up.  Since his last visit, he unfortunately suffered a CVA and now has neurological and cognitive deficits.  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  He is being closely followed and managed by neurology.   He does have a personal history of recurrent nephrolithiasis. He last underwent ureteroscopy in 03/2016 at which time he underwent cystoscopy revealed an enlarged prostate, trilobar coaptation with an elevated bladder neck. He also underwent bilateral retrograde pyelograms which showed no evidence of filling defects or malignancy. He was treated for stones.  Since his last visit, he had no further episodes of flank pain, gross hematuria, or stone episodes.  Just prior to his stroke, he did perform a 24-hour urine metabolic analysis.  This was exceptional primarily for extremely low urinary volume of only approximately 500 cc.  His wife reports today that potassium citrate was prescribed in the past but he was unable to tolerate this medication due to feeling unwell and nonspecific symptoms.  KUB today shows no obvious stone burden.   PMH: Past Medical History:  Diagnosis Date  . Arthritis   . Atrial fibrillation (HCC) 2016   paroxysmal atrial fibrillation, sick sinus syndrome  . Complication of anesthesia    affects memory for a longer time after  . Dementia    alzheimers  . Essential hypertension   . GERD (gastroesophageal reflux disease)   . History of kidney stones   . Hyperlipemia   . Inguinal hernia recurrent bilateral   . Kidney stones   .  Stroke [facility])     Surgical History: Past Surgical History:  Procedure Laterality Date  . CYSTOSCOPY W/ RETROGRADES Bilateral 03/27/2016   Procedure: CYSTOSCOPY WITH RETROGRADE PYELOGRAM;  Surgeon: Vanna Scotland, MD;  Location: [facility];  Service: Urology;  Laterality: Bilateral;  . CYSTOSCOPY W/ URETERAL STENT PLACEMENT Bilateral 02/24/2015   Procedure: CYSTOSCOPY WITH STENT REPLACEMENT;  Surgeon: Hildred Laser, MD;  Location: [facility];  Service: Urology;  Laterality: Bilateral;  . CYSTOSCOPY W/ URETERAL STENT PLACEMENT Left 04/11/2016   Procedure: CYSTOSCOPY WITH STENT REPLACEMENT;  Surgeon: Vanna Scotland, MD;  Location: [facility];  Service: Urology;  Laterality: Left;  . CYSTOSCOPY W/ URETERAL STENT REMOVAL Right 04/11/2016   Procedure: CYSTOSCOPY WITH STENT REMOVAL;  Surgeon: Vanna Scotland, MD;  Location: [facility];  Service: Urology;  Laterality: Right;  . CYSTOSCOPY WITH RETROGRADE PYELOGRAM, URETEROSCOPY AND STENT PLACEMENT Bilateral 12/10/2014   Procedure: CYSTOSCOPY WITH RETROGRADE PYELOGRAM, URETEROSCOPY AND STENT PLACEMENT;  Surgeon: Hildred Laser, MD;  Location: [facility];  Service: Urology;  Laterality: Bilateral;  . CYSTOSCOPY WITH STENT PLACEMENT Bilateral 12/10/2014   Procedure: CYSTOSCOPY WITH STENT PLACEMENT;  Surgeon: Hildred Laser, MD;  Location: [facility];  Service: Urology;  Laterality: Bilateral;  . CYSTOSCOPY WITH STENT PLACEMENT Bilateral 03/27/2016   Procedure: CYSTOSCOPY WITH STENT PLACEMENT;  Surgeon: Vanna Scotland, MD;  Location: [facility];  Service: Urology;  Laterality: Bilateral;  . EXTRACORPOREAL SHOCK WAVE LITHOTRIPSY Right 01/07/2015   Procedure: EXTRACORPOREAL SHOCK WAVE LITHOTRIPSY (ESWL);  Surgeon: Lorraine Lax, MD;  Location: [facility];  Service: Urology;  Laterality: Right;  . EXTRACORPOREAL SHOCK WAVE LITHOTRIPSY Left 01/21/2015   Procedure: EXTRACORPOREAL SHOCK WAVE LITHOTRIPSY (ESWL);  Surgeon: Hildred LaserBrian Eulon Budzyn, MD;  Location: [facility];   Service: Urology;  Laterality: Left;  . EYE SURGERY Bilateral    Cataract Extraction with IOL  . HERNIA REPAIR Bilateral    inguinal  . URETEROSCOPY WITH HOLMIUM LASER LITHOTRIPSY Bilateral 02/24/2015   Procedure: URETEROSCOPY WITH HOLMIUM LASER LITHOTRIPSY /STONE BASKETING;  Surgeon: Hildred LaserBrian Frederick Budzyn, MD;  Location: [facility];  Service: Urology;  Laterality: Bilateral;  . URETEROSCOPY WITH HOLMIUM LASER LITHOTRIPSY Bilateral 04/11/2016   Procedure: URETEROSCOPY WITH HOLMIUM LASER LITHOTRIPSY;  Surgeon: Vanna ScotlandAshley Cyler Kappes, MD;  Location: [facility];  Service: Urology;  Laterality: Bilateral;    Home Medications:  Allergies as of 03/02/2017      Reactions   Codeine Other (See Comments)   Causes side effects   Penicillin G Rash   Has patient had a PCN reaction causing immediate rash, facial/tongue/throat swelling, SOB or lightheadedness with hypotension: Yes Has patient had a PCN reaction causing severe rash involving mucus membranes or skin necrosis: No Has patient had a PCN reaction that required hospitalization: No Has patient had a PCN reaction occurring within the last 10 years: No If all of the above answers are "NO", then may proceed with Cephalosporin use.   Sulfa Antibiotics Rash   Pt and fam are saying he does not have allergy to sulfa now.      Medication List        Accurate as of 03/02/17 11:59 PM. Always use your most recent med list.          hydrochlorothiazide 25 MG tablet Commonly known as:  HYDRODIURIL Take 12.5 mg by mouth daily.   levETIRAcetam 500 MG tablet Commonly known as:  KEPPRA Take one tablet nightly   metoprolol succinate 25 MG 24 hr tablet Commonly known as:  TOPROL-XL Take 25 mg by mouth daily.   pantoprazole 40 MG tablet Commonly known as:  PROTONIX Take 40 mg by mouth daily.   rivastigmine 3 MG capsule Commonly known as:  EXELON Take 3 mg by mouth 2 (two) times daily.   sertraline 25 MG tablet Commonly known as:  ZOLOFT Take 25 mg by mouth  daily.   simvastatin 20 MG tablet Commonly known as:  ZOCOR Take 20 mg by mouth daily.       Allergies:  Allergies  Allergen Reactions  . Codeine Other (See Comments)    Causes side effects  . Penicillin G Rash    Has patient had a PCN reaction causing immediate rash, facial/tongue/throat swelling, SOB or lightheadedness with hypotension: Yes Has patient had a PCN reaction causing severe rash involving mucus membranes or skin necrosis: No Has patient had a PCN reaction that required hospitalization: No Has patient had a PCN reaction occurring within the last 10 years: No If all of the above answers are "NO", then may proceed with Cephalosporin use.  . Sulfa Antibiotics Rash    Pt and fam are saying he does not have allergy to sulfa now.    Family History: Family History  Problem Relation Age of Onset  . Heart attack Mother   . Heart attack Father   . Hypertension Unknown   . Kidney disease Neg Hx   . Prostate cancer Neg Hx     Social History:  reports that  has never smoked. he has never used smokeless tobacco. He reports that he does not drink alcohol or use  drugs.  ROS: UROLOGY Frequent Urination?: No Hard to postpone urination?: No Burning/pain with urination?: No Get up at night to urinate?: No Leakage of urine?: No Urine stream starts and stops?: No Trouble starting stream?: No Do you have to strain to urinate?: No Blood in urine?: No Urinary tract infection?: No Sexually transmitted disease?: No Injury to kidneys or bladder?: No Painful intercourse?: No Weak stream?: No Erection problems?: No Penile pain?: No  Gastrointestinal Nausea?: No Vomiting?: No Indigestion/heartburn?: No Diarrhea?: No Constipation?: No  Constitutional Fever: No Night sweats?: No Weight loss?: No Fatigue?: No  Skin Skin rash/lesions?: No Itching?: No  Eyes Blurred vision?: No Double vision?: No  Ears/Nose/Throat Sore throat?: No Sinus problems?:  No  Hematologic/Lymphatic Swollen glands?: No Easy bruising?: No  Cardiovascular Leg swelling?: No Chest pain?: No  Respiratory Cough?: No Shortness of breath?: No  Endocrine Excessive thirst?: No  Musculoskeletal Back pain?: No Joint pain?: No  Neurological Headaches?: No Dizziness?: Yes  Psychologic Depression?: No Anxiety?: No  Physical Exam: BP 99/63   Pulse 86   Ht 5\' 7"  (1.702 m)   Wt 154 lb (69.9 kg)   BMI 24.12 kg/m   Constitutional: Alert but fairly quiet today during visit.  He does answer questions appropriately.  His wife provides the majority of his history today. HEENT: [place] AT, moist mucus membranes.  Trachea midline, no masses. Cardiovascular: No clubbing, cyanosis, or edema. Respiratory: Normal respiratory effort, no increased work of breathing. GI: Abdomen is soft, nontender, nondistended, no abdominal masses GU: No CVA tenderness.  Skin: No rashes, bruises or suspicious lesions. Neurologic: Grossly intact, no focal deficits, moving all 4 extremities. Psychiatric: Flat affect  Laboratory Data: Lab Results  Component Value Date   WBC 10.8 (H) 12/28/2016   HGB 17.1 12/28/2016   HCT 51.8 12/28/2016   MCV 92.2 12/28/2016   PLT 264 12/28/2016    Lab Results  Component Value Date   CREATININE 1.35 (H) 12/28/2016    Lab Results  Component Value Date   HGBA1C 6.9 (H) 07/08/2016   24-hour urine left leg results were personally reviewed today from 01/08/2017  Urinalysis Component     Latest Ref Rng & Units 12/28/2016  Color, Urine     YELLOW YELLOW (A)  Appearance     CLEAR CLEAR (A)  Glucose     NEGATIVE mg/dL NEGATIVE  Bilirubin Urine     NEGATIVE NEGATIVE  Ketones, ur     NEGATIVE mg/dL NEGATIVE  Specific Gravity, Urine     1.005 - 1.030 1.012  Hgb urine dipstick     NEGATIVE NEGATIVE  pH     5.0 - 8.0 5.0  Protein     NEGATIVE mg/dL NEGATIVE  Nitrite     NEGATIVE NEGATIVE  Leukocytes, UA     NEGATIVE NEGATIVE  RBC /  HPF     0 - 5 RBC/hpf 0-5  WBC, UA     0 - 5 WBC/hpf 0-5  Bacteria, UA     NONE SEEN NONE SEEN  Squamous Epithelial / LPF     NONE SEEN 0-5 (A)  Hyaline Casts, UA      PRESENT   Pertinent Imaging: CLINICAL DATA:  Difficulty urinating.  History kidney stones.  EXAM: ABDOMEN - 1 VIEW  COMPARISON:  07/20/2016  Body CT 03/27/2016  FINDINGS: Incomplete visualization of the abdomen demonstrates nonobstructive bowel gas pattern. No suspicious calcifications. Phleboliths in the left pelvis. Surgical anchors overlie the pelvis.  Mild S shaped scoliosis of  the lumbosacral spine with osteoarthritic changes.  IMPRESSION: Nonobstructive bowel gas pattern.   Electronically Signed   By: Ted Mcalpine M.D.   On: 03/02/2017 20:23  Assessment & Plan:    1. History of kidney stones KUB personally reviewed today, this shows no evidence of new stone formation Stone precautions reviewed 24-hour urine results reviewed, extremely low urinary volume appreciated The patient was counseled to increase fluids with a goal urine output of 2500 cc as his primary intervention Unable to tolerate potassium citrate We will follow conservatively with KUB in 1 year or sooner as needed given his multiple medical comorbidities - DG Abd 1 View; Future  2. History of hematuria Personal history of gross and microscopic hematuria status post workup Has been taken off anticoagulation in light of hemorrhagic stroke Advised to return sooner if he develops episodes of gross hematuria-would likely benefit from finasteride therapy if he has episodes of painless gross hematuria in the setting of enlarged friable prostate    Return in about 1 year (around 03/02/2018) for KUB today, f/u in 1 year.  Vanna Scotland, MD  [facility] [address], [address] [place], [place] [address] [phone]

## 2017-03-05 ENCOUNTER — Ambulatory Visit: Payer: Medicare Other | Admitting: Family Medicine

## 2017-03-06 ENCOUNTER — Telehealth: Payer: Self-pay

## 2017-03-06 NOTE — Telephone Encounter (Signed)
Letter sent.

## 2017-03-06 NOTE — Telephone Encounter (Signed)
-----   Message from [name], MD sent at 03/03/2017  3:57 PM EST ----- No obvious kidney stones.  Great news.  I'll see him next year.  [name], MD

## 2017-04-06 ENCOUNTER — Emergency Department: Payer: Medicare Other

## 2017-04-06 ENCOUNTER — Other Ambulatory Visit: Payer: Self-pay

## 2017-04-06 ENCOUNTER — Emergency Department
Admission: EM | Admit: 2017-04-06 | Discharge: 2017-04-06 | Disposition: A | Discharge status: Home / Self Care | Payer: Medicare Other | Attending: Emergency Medicine | Admitting: Emergency Medicine

## 2017-04-06 DIAGNOSIS — I10 Essential (primary) hypertension: Secondary | ICD-10-CM | POA: Insufficient documentation

## 2017-04-06 DIAGNOSIS — Y929 Unspecified place or not applicable: Secondary | ICD-10-CM | POA: Insufficient documentation

## 2017-04-06 DIAGNOSIS — S0081XA Abrasion of other part of head, initial encounter: Secondary | ICD-10-CM | POA: Diagnosis not present

## 2017-04-06 DIAGNOSIS — Y999 Unspecified external cause status: Secondary | ICD-10-CM | POA: Diagnosis not present

## 2017-04-06 DIAGNOSIS — Z79899 Other long term (current) drug therapy: Secondary | ICD-10-CM | POA: Insufficient documentation

## 2017-04-06 DIAGNOSIS — W19XXXA Unspecified fall, initial encounter: Secondary | ICD-10-CM

## 2017-04-06 DIAGNOSIS — W06XXXA Fall from bed, initial encounter: Secondary | ICD-10-CM | POA: Diagnosis not present

## 2017-04-06 DIAGNOSIS — M549 Dorsalgia, unspecified: Secondary | ICD-10-CM | POA: Diagnosis present

## 2017-04-06 DIAGNOSIS — S22080A Wedge compression fracture of T11-T12 vertebra, initial encounter for closed fracture: Secondary | ICD-10-CM | POA: Diagnosis not present

## 2017-04-06 DIAGNOSIS — Y939 Activity, unspecified: Secondary | ICD-10-CM | POA: Insufficient documentation

## 2017-04-06 LAB — COMPREHENSIVE METABOLIC PANEL
ALBUMIN: 4.3 g/dL (ref 3.5–5.0)
ALT: 20 U/L (ref 17–63)
AST: 33 U/L (ref 15–41)
Alkaline Phosphatase: 73 U/L (ref 38–126)
Anion gap: 10 (ref 5–15)
BUN: 22 mg/dL — AB (ref 6–20)
CO2: 25 mmol/L (ref 22–32)
CREATININE: 1.2 mg/dL (ref 0.61–1.24)
Calcium: 9.7 mg/dL (ref 8.9–10.3)
Chloride: 107 mmol/L (ref 101–111)
GFR calc Af Amer: >60 mL/min (ref >60)
GFR calc non Af Amer: 54 mL/min — ABNORMAL LOW (ref >60)
GLUCOSE: 155 mg/dL — AB (ref 65–99)
Potassium: 3.3 mmol/L — ABNORMAL LOW (ref 3.5–5.1)
SODIUM: 142 mmol/L (ref 135–145)
Total Bilirubin: 1.2 mg/dL (ref 0.3–1.2)
Total Protein: 7.1 g/dL (ref 6.5–8.1)

## 2017-04-06 LAB — CBC
HEMATOCRIT: 48.6 % (ref 40.0–52.0)
Hemoglobin: 16.3 g/dL (ref 13.0–18.0)
MCH: 30.7 pg (ref 26.0–34.0)
MCHC: 33.5 g/dL (ref 32.0–36.0)
MCV: 91.5 fL (ref 80.0–100.0)
PLATELETS: 294 10*3/uL (ref 150–440)
RBC: 5.31 MIL/uL (ref 4.40–5.90)
RDW: 15.8 % — AB (ref 11.5–14.5)
WBC: 16.6 10*3/uL — ABNORMAL HIGH (ref 3.8–10.6)

## 2017-04-06 LAB — LIPASE, BLOOD: Lipase: 55 U/L — ABNORMAL HIGH (ref 11–51)

## 2017-04-06 MED ORDER — ACETAMINOPHEN 500 MG PO TABS
1000.0000 mg | ORAL_TABLET | Freq: Once | ORAL | Status: AC
  Administered 2017-04-06: 1000 mg via ORAL
  Filled 2017-04-06: qty 2

## 2017-04-06 NOTE — ED Notes (Signed)
Assisted pt with ambulation while pt was wearing his LSO brace for lower spine injury. Pt normally walks with a cane, today we used an available walker. Pt ambulated well with steady gate, and no further assistance but did have difficulty getting to his feet from supine position with brace on. Less difficulty getting back into bed with brace on.

## 2017-04-06 NOTE — ED Provider Notes (Signed)
[facility] Emergency Department Provider Note  ____________________________________________   I have reviewed the triage vital signs and the nursing notes.   HISTORY  Chief Complaint Back pain  History limited by and level 5 caveat due to dementia, some history obtained from family   HPI William Lozano. is a 84 y.o. male who presents to the emergency department today because of family concern for back pain. Family states that the patient had a fall while getting out of bed last night. Did hit his head but was able to get up and use the bathroom afterwards. Was able to walk back to the bed from the bathroom. Later in the night however he could not get up. Was complaining of pain in his lower back. No recent illnesses per family. No history of back fracture or disease.    Per medical record review patient has a history of dementia, kidney stones.  Past Medical History:  Diagnosis Date  . Arthritis   . Atrial fibrillation (HCC) 2016   paroxysmal atrial fibrillation, sick sinus syndrome  . Complication of anesthesia    affects memory for a longer time after  . Dementia    alzheimers  . Essential hypertension   . GERD (gastroesophageal reflux disease)   . History of kidney stones   . Hyperlipemia   . Inguinal hernia recurrent bilateral   . Kidney stones   . Stroke [facility])     Patient Active Problem List   Diagnosis Date Noted  . 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 Dizziness 07/07/2016  . Cerebral hemorrhage(nontraumatic) (HCC) 06/30/2016  . Urolithiasis 03/28/2016  . Pyuria 03/28/2016  . Hydronephrosis 03/27/2016  . Cardiac arrhythmia 07/22/2015  . Atrial fibrillation with RVR (HCC) 02/24/2015  . Cardiac conduction disorder 02/04/2015  . Arthritis 02/04/2015  . Atrial fibrillation (HCC) 02/04/2015  . Acid reflux 02/04/2015  . H/O vertigo 02/04/2015  . HLD (hyperlipidemia) 02/04/2015  . BP (high blood pressure) 02/04/2015  .  Calculus of kidney 02/04/2015  . Sick sinus syndrome (HCC) 02/04/2015  . Fungal infection of nail 02/04/2015  . Type 2 diabetes mellitus (HCC) 02/04/2015  . Sepsis (HCC) 12/10/2014  . Systemic infection (HCC) 12/10/2014  . Sepsis(995.91) 12/10/2014  . Altered bowel function 10/13/2014  . Chronic diarrhea 10/13/2014  . Change in bowel habits 10/13/2014  . Senile dementia of Alzheimer's type 09/20/2014  . Alzheimer's dementia, late onset 09/20/2014  . Drug resistance 10/22/2013    Past Surgical History:  Procedure Laterality Date  . CYSTOSCOPY W/ RETROGRADES Bilateral 03/27/2016   Procedure: CYSTOSCOPY WITH RETROGRADE PYELOGRAM;  Surgeon: Vanna Scotland, MD;  Location: [facility];  Service: Urology;  Laterality: Bilateral;  . CYSTOSCOPY W/ URETERAL STENT PLACEMENT Bilateral 02/24/2015   Procedure: CYSTOSCOPY WITH STENT REPLACEMENT;  Surgeon: Hildred Laser, MD;  Location: [facility];  Service: Urology;  Laterality: Bilateral;  . CYSTOSCOPY W/ URETERAL STENT PLACEMENT Left 04/11/2016   Procedure: CYSTOSCOPY WITH STENT REPLACEMENT;  Surgeon: Vanna Scotland, MD;  Location: [facility];  Service: Urology;  Laterality: Left;  . CYSTOSCOPY W/ URETERAL STENT REMOVAL Right 04/11/2016   Procedure: CYSTOSCOPY WITH STENT REMOVAL;  Surgeon: Vanna Scotland, MD;  Location: [facility];  Service: Urology;  Laterality: Right;  . CYSTOSCOPY WITH RETROGRADE PYELOGRAM, URETEROSCOPY AND STENT PLACEMENT Bilateral 12/10/2014   Procedure: CYSTOSCOPY WITH RETROGRADE PYELOGRAM, URETEROSCOPY AND STENT PLACEMENT;  Surgeon: Hildred Laser, MD;  Location: [facility];  Service: Urology;  Laterality: Bilateral;  . CYSTOSCOPY WITH STENT PLACEMENT Bilateral  12/10/2014   Procedure: CYSTOSCOPY WITH STENT PLACEMENT;  Surgeon: Hildred Laser, MD;  Location: [facility];  Service: Urology;  Laterality: Bilateral;  . CYSTOSCOPY WITH STENT PLACEMENT Bilateral 03/27/2016   Procedure: CYSTOSCOPY WITH STENT PLACEMENT;  Surgeon: Vanna Scotland, MD;  Location: [facility];  Service: Urology;  Laterality: Bilateral;  . EXTRACORPOREAL SHOCK WAVE LITHOTRIPSY Right 01/07/2015   Procedure: EXTRACORPOREAL SHOCK WAVE LITHOTRIPSY (ESWL);  Surgeon: Lorraine Lax, MD;  Location: [facility];  Service: Urology;  Laterality: Right;  . EXTRACORPOREAL SHOCK WAVE LITHOTRIPSY Left 01/21/2015   Procedure: EXTRACORPOREAL SHOCK WAVE LITHOTRIPSY (ESWL);  Surgeon: Hildred Laser, MD;  Location: [facility];  Service: Urology;  Laterality: Left;  . EYE SURGERY Bilateral    Cataract Extraction with IOL  . HERNIA REPAIR Bilateral    inguinal  . URETEROSCOPY WITH HOLMIUM LASER LITHOTRIPSY Bilateral 02/24/2015   Procedure: URETEROSCOPY WITH HOLMIUM LASER LITHOTRIPSY /STONE BASKETING;  Surgeon: Hildred Laser, MD;  Location: [facility];  Service: Urology;  Laterality: Bilateral;  . URETEROSCOPY WITH HOLMIUM LASER LITHOTRIPSY Bilateral 04/11/2016   Procedure: URETEROSCOPY WITH HOLMIUM LASER LITHOTRIPSY;  Surgeon: Vanna Scotland, MD;  Location: [facility];  Service: Urology;  Laterality: Bilateral;    Prior to Admission medications   Medication Sig Start Date End Date Taking? Authorizing Provider  hydrochlorothiazide (HYDRODIURIL) 25 MG tablet Take 12.5 mg by mouth daily.  04/12/16   [provider]  levETIRAcetam (KEPPRA) 500 MG tablet Take one tablet nightly 02/21/17   [provider]  metoprolol succinate (TOPROL-XL) 25 MG 24 hr tablet Take 25 mg by mouth daily.    [provider]  pantoprazole (PROTONIX) 40 MG tablet Take 40 mg by mouth daily.    [provider]  rivastigmine (EXELON) 3 MG capsule Take 3 mg by mouth 2 (two) times daily.     [provider]  sertraline (ZOLOFT) 25 MG tablet Take 25 mg by mouth daily.    [provider]  simvastatin (ZOCOR) 20 MG tablet Take 20 mg by mouth daily.    [provider]    Allergies Codeine; Penicillin g; and Sulfa antibiotics  Family History   Problem Relation Age of Onset  . Heart attack Mother   . Heart attack Father   . Hypertension Unknown   . Kidney disease Neg Hx   . Prostate cancer Neg Hx     Social History Social History   Tobacco Use  . Smoking status: Never Smoker  . Smokeless tobacco: Never Used  Substance Use Topics  . Alcohol use: No    Alcohol/week: 0.0 oz  . Drug use: No    Review of Systems Constitutional: No fever/chills Eyes: No visual changes. ENT: No sore throat. Cardiovascular: Denies chest pain. Respiratory: Denies shortness of breath. Gastrointestinal: No abdominal pain.  No nausea, no vomiting.  No diarrhea.   Genitourinary: Negative for dysuria. Musculoskeletal: Positive for back pain. Skin: Positive for laceration to occiput. Neurological: Negative for headaches, focal weakness or numbness.  ____________________________________________   PHYSICAL EXAM:  VITAL SIGNS: ED Triage Vitals  Enc Vitals Group     BP 04/06/17 0323 (!) 129/97     Pulse Rate 04/06/17 0323 (!) 116     Resp 04/06/17 0323 20     Temp 04/06/17 0323 (!) 97.5 F (36.4 C)     Temp Source 04/06/17 0323 Oral     SpO2 04/06/17 0323 95 %     Weight 04/06/17 0324 155 lb (70.3 kg)  Height 04/06/17 0324 6' (1.829 m)     Head Circumference --      Peak Flow --      Pain Score 04/06/17 0324 8    Constitutional: Alert and oriented. Well appearing and in no distress. Eyes: Conjunctivae are normal.  ENT   Head: Normocephalic.   Nose: No congestion/rhinnorhea.   Mouth/Throat: Mucous membranes are moist.   Neck: No stridor. No midline tenderness. Hematological/Lymphatic/Immunilogical: No cervical lymphadenopathy. Cardiovascular: Tachycardic, regular rhythm.  No murmurs, rubs, or gallops.  Respiratory: Normal respiratory effort without tachypnea nor retractions. Breath sounds are clear and equal bilaterally. No wheezes/rales/rhonchi. Gastrointestinal: Soft and non tender. No rebound. No guarding.   Genitourinary: Deferred Musculoskeletal: Normal range of motion in all extremities. No lower extremity edema. Neurologic:  Normal speech and language. No gross focal neurologic deficits are appreciated.  Skin:  Small abrasion to back of head. Psychiatric: Mood and affect are normal. Speech and behavior are normal. Patient exhibits appropriate insight and judgment.  ____________________________________________    LABS (pertinent positives/negatives)  Lipase 55 CBC wbc 16.6, hgb 16.3, plt 294 CMP k 3.3, na 142, glu 155, cr 1.20  ____________________________________________   EKG  None  ____________________________________________    RADIOLOGY  CT head No acute findings  Lumbar spine t11 compression fracture  ____________________________________________   PROCEDURES  Procedures  ____________________________________________   INITIAL IMPRESSION / ASSESSMENT AND PLAN / ED COURSE  Pertinent labs & imaging results that were available during my care of the patient were reviewed by me and considered in my medical decision making (see chart for details).  Patient presented to the emergency department today with primary concerns for back pain after a fall.  Lumbar spine x-rays did show a T11 compression fracture.  I do think this could explain the patient's pain.  Will have medical supply company apply brace.  Discussed with family that after brace application will attempt to ambulate the patient.  Blood work did show mild leukocytosis of unclear etiology. Awaiting urine at time of sign out.   ____________________________________________   FINAL CLINICAL IMPRESSION(S) / ED DIAGNOSES  Final diagnoses:  Fall, initial encounter  Traumatic compression fracture of T11 thoracic vertebra, closed, initial encounter [facility](HCC)     Note: This dictation was prepared with Dragon dictation. Any transcriptional errors that result from this process are unintentional     Phineas SemenGoodman,  Talyn Dessert, MD 04/06/17 [phone]

## 2017-04-06 NOTE — Discharge Instructions (Addendum)
Please seek medical attention for any high fevers, chest pain, shortness of breath, change in behavior, persistent vomiting, bloody stool or any other new or concerning symptoms.  

## 2017-04-06 NOTE — ED Provider Notes (Signed)
The patient's son, his girlfriend are at the bedside.  ----------------------------------------- 1:01 PM on 04/06/2017 -----------------------------------------  Patient able to ambulate well with brace on.  His pain is much better with the brace on.  Discussed with patient as well as his son, primarily son to his dementia.  They will be taking him home to his house where they have a ramp already built, they will be calling or walking over to Dr. Judithann SheenSparks office today to assure follow-up and inquire about further in-home services.  Patient is able to ambulate, pain is controlled, alert ate a meal and is in no distress.  Will discharge home at this time, discussed brace, ongoing care and follow-up with Dr. Judithann SheenSparks.  Patient and his son agreeable.   Sharyn CreamerQuale, Mark, MD 04/06/17 1302

## 2017-04-06 NOTE — ED Triage Notes (Signed)
Pt arrives to ED via [facility] from home s/p fall. EMS reports pt rolled OOB, small laceration to back of head reported. Pt denies any LOC after fall. EMS reports pt is now c/o 8/10 lower abdominal/bladder area pain that started en route. Pt reports pain increases with movement. Pt arrives with 20g IV in place to LEFT wrist. Pt is alert to self and situation only at this time.

## 2017-04-09 NOTE — Care Management Note (Signed)
Case Management Note  Patient Details  Name: William PigeonJames B Magro Sr. MRN: [id number] Date of Birth: 02/07/1933  Subjective/Objective:         After being contacted by Timmothy Soursollyn Gillespie, about calls from the son to the ER I called the family listed.Spoke to patient's sons on telephone. [name], the son, has asked about rehab guidelines, and says the patient is worse now than he was when he came in and was diagnosed with compression fracture. The spouse does not live with the patient and is herself disabled. I went through the idea of what Medicare pays for, the 3 night inpatient rule, and the idea that the family would have to pay out of pocket . I also found that the main problem is the day to day care of the patient that is the big concern for the family. I have explained to [name] that what they need is an agency that provides personal care services close to around the clock.   It has taken some reinforcement, but have told [name] if his father is ill we will happily see him in the ER and treat him, but that would not qualify him for placement in rehab. The original ER MD seeing the patient meant that home PT would be good for the patient, and was not recommending rehab fulltime. At this time the family has no more questions, but I have suggested they do an internet search and ask some agencies about what they charge and then decide if they can make arrangements to be with the patient or hire outside help out of pocket. I have clarified that most sec. Insurances follow the medicare lead and only pay the co-pays/ deductibles left after Medicare pays their share.Will be available if needed. I have also contacted Dr Judithann SheenSparks and verified this is what he relayed to the family.      Action/Plan:   Expected Discharge Date:                  Expected Discharge Plan:     In-House Referral:     Discharge planning Services     Post Acute Care Choice:    Choice offered to:     DME Arranged:    DME Agency:     HH  Arranged:    HH Agency:     Status of Service:     If discussed at [employer] Length of Stay Meetings, dates discussed:    Additional Comments:  Berna BueCheryl Laretha Luepke, RN 04/09/2017, 12:24 PM

## 2017-05-07 ENCOUNTER — Other Ambulatory Visit: Payer: Self-pay | Admitting: Nurse Practitioner

## 2017-05-07 DIAGNOSIS — S0990XS Unspecified injury of head, sequela: Secondary | ICD-10-CM

## 2017-05-08 ENCOUNTER — Other Ambulatory Visit: Payer: Self-pay

## 2017-05-08 ENCOUNTER — Inpatient Hospital Stay: Payer: Medicare Other

## 2017-05-08 ENCOUNTER — Inpatient Hospital Stay
Admission: EM | Admit: 2017-05-08 | Discharge: 2017-05-17 | DRG: 641 | Disposition: A | Discharge status: Home / Self Care | Payer: Medicare Other | Attending: Specialist | Admitting: Specialist

## 2017-05-08 ENCOUNTER — Emergency Department: Payer: Medicare Other

## 2017-05-08 ENCOUNTER — Encounter: Payer: Self-pay | Admitting: Emergency Medicine

## 2017-05-08 DIAGNOSIS — E46 Unspecified protein-calorie malnutrition: Secondary | ICD-10-CM | POA: Diagnosis not present

## 2017-05-08 DIAGNOSIS — K219 Gastro-esophageal reflux disease without esophagitis: Secondary | ICD-10-CM | POA: Diagnosis present

## 2017-05-08 DIAGNOSIS — I4891 Unspecified atrial fibrillation: Secondary | ICD-10-CM | POA: Diagnosis not present

## 2017-05-08 DIAGNOSIS — R410 Disorientation, unspecified: Secondary | ICD-10-CM | POA: Diagnosis not present

## 2017-05-08 DIAGNOSIS — Z7189 Other specified counseling: Secondary | ICD-10-CM | POA: Diagnosis not present

## 2017-05-08 DIAGNOSIS — E785 Hyperlipidemia, unspecified: Secondary | ICD-10-CM | POA: Diagnosis present

## 2017-05-08 DIAGNOSIS — G9349 Other encephalopathy: Secondary | ICD-10-CM | POA: Diagnosis present

## 2017-05-08 DIAGNOSIS — R627 Adult failure to thrive: Secondary | ICD-10-CM | POA: Diagnosis present

## 2017-05-08 DIAGNOSIS — D72829 Elevated white blood cell count, unspecified: Secondary | ICD-10-CM | POA: Diagnosis present

## 2017-05-08 DIAGNOSIS — F0391 Unspecified dementia with behavioral disturbance: Secondary | ICD-10-CM | POA: Diagnosis present

## 2017-05-08 DIAGNOSIS — F03918 Unspecified dementia, unspecified severity, with other behavioral disturbance: Secondary | ICD-10-CM

## 2017-05-08 DIAGNOSIS — E876 Hypokalemia: Secondary | ICD-10-CM | POA: Diagnosis not present

## 2017-05-08 DIAGNOSIS — F05 Delirium due to known physiological condition: Secondary | ICD-10-CM | POA: Diagnosis present

## 2017-05-08 DIAGNOSIS — E870 Hyperosmolality and hypernatremia: Secondary | ICD-10-CM | POA: Diagnosis present

## 2017-05-08 DIAGNOSIS — I482 Chronic atrial fibrillation: Secondary | ICD-10-CM | POA: Diagnosis present

## 2017-05-08 DIAGNOSIS — E860 Dehydration: Principal | ICD-10-CM | POA: Diagnosis present

## 2017-05-08 DIAGNOSIS — G309 Alzheimer's disease, unspecified: Secondary | ICD-10-CM | POA: Diagnosis present

## 2017-05-08 DIAGNOSIS — J811 Chronic pulmonary edema: Secondary | ICD-10-CM | POA: Diagnosis present

## 2017-05-08 DIAGNOSIS — Z681 Body mass index (BMI) 19 or less, adult: Secondary | ICD-10-CM | POA: Diagnosis not present

## 2017-05-08 DIAGNOSIS — R197 Diarrhea, unspecified: Secondary | ICD-10-CM | POA: Diagnosis not present

## 2017-05-08 DIAGNOSIS — Z8673 Personal history of transient ischemic attack (TIA), and cerebral infarction without residual deficits: Secondary | ICD-10-CM | POA: Diagnosis not present

## 2017-05-08 DIAGNOSIS — I10 Essential (primary) hypertension: Secondary | ICD-10-CM | POA: Diagnosis present

## 2017-05-08 DIAGNOSIS — I959 Hypotension, unspecified: Secondary | ICD-10-CM | POA: Diagnosis present

## 2017-05-08 DIAGNOSIS — Z515 Encounter for palliative care: Secondary | ICD-10-CM

## 2017-05-08 DIAGNOSIS — R159 Full incontinence of feces: Secondary | ICD-10-CM | POA: Diagnosis present

## 2017-05-08 DIAGNOSIS — Z7982 Long term (current) use of aspirin: Secondary | ICD-10-CM

## 2017-05-08 DIAGNOSIS — Z66 Do not resuscitate: Secondary | ICD-10-CM | POA: Diagnosis present

## 2017-05-08 DIAGNOSIS — E440 Moderate protein-calorie malnutrition: Secondary | ICD-10-CM | POA: Diagnosis present

## 2017-05-08 DIAGNOSIS — R52 Pain, unspecified: Secondary | ICD-10-CM

## 2017-05-08 DIAGNOSIS — Z9181 History of falling: Secondary | ICD-10-CM

## 2017-05-08 DIAGNOSIS — R0602 Shortness of breath: Secondary | ICD-10-CM

## 2017-05-08 DIAGNOSIS — R32 Unspecified urinary incontinence: Secondary | ICD-10-CM | POA: Diagnosis present

## 2017-05-08 DIAGNOSIS — N179 Acute kidney failure, unspecified: Secondary | ICD-10-CM | POA: Diagnosis not present

## 2017-05-08 LAB — CBC WITH DIFFERENTIAL/PLATELET
BASOS PCT: 1 %
Basophils Absolute: 0.1 10*3/uL (ref 0–0.1)
EOS ABS: 0 10*3/uL (ref 0–0.7)
EOS PCT: 0 %
HCT: 44.9 % (ref 40.0–52.0)
Hemoglobin: 14.8 g/dL (ref 13.0–18.0)
Lymphocytes Relative: 4 %
Lymphs Abs: 0.7 10*3/uL — ABNORMAL LOW (ref 1.0–3.6)
MCH: 30.3 pg (ref 26.0–34.0)
MCHC: 32.9 g/dL (ref 32.0–36.0)
MCV: 92 fL (ref 80.0–100.0)
MONO ABS: 1.4 10*3/uL — AB (ref 0.2–1.0)
MONOS PCT: 9 %
Neutro Abs: 14.4 10*3/uL — ABNORMAL HIGH (ref 1.4–6.5)
Neutrophils Relative %: 86 %
Platelets: 352 10*3/uL (ref 150–440)
RBC: 4.88 MIL/uL (ref 4.40–5.90)
RDW: 15.2 % — AB (ref 11.5–14.5)
WBC: 16.6 10*3/uL — ABNORMAL HIGH (ref 3.8–10.6)

## 2017-05-08 LAB — URINALYSIS, COMPLETE (UACMP) WITH MICROSCOPIC
Bacteria, UA: NONE SEEN
Bilirubin Urine: NEGATIVE
Glucose, UA: NEGATIVE mg/dL
Ketones, ur: NEGATIVE mg/dL
Leukocytes, UA: NEGATIVE
Nitrite: NEGATIVE
PH: 5 (ref 5.0–8.0)
Protein, ur: 30 mg/dL — AB
RBC / HPF: NONE SEEN RBC/hpf (ref 0–5)
SPECIFIC GRAVITY, URINE: 1.019 (ref 1.005–1.030)
Squamous Epithelial / LPF: NONE SEEN

## 2017-05-08 LAB — COMPREHENSIVE METABOLIC PANEL
ALBUMIN: 3.6 g/dL (ref 3.5–5.0)
ALK PHOS: 79 U/L (ref 38–126)
ALT: 14 U/L — AB (ref 17–63)
AST: 46 U/L — AB (ref 15–41)
Anion gap: 12 (ref 5–15)
BILIRUBIN TOTAL: 1.5 mg/dL — AB (ref 0.3–1.2)
BUN: 24 mg/dL — AB (ref 6–20)
CALCIUM: 9.3 mg/dL (ref 8.9–10.3)
CO2: 27 mmol/L (ref 22–32)
CREATININE: 1.05 mg/dL (ref 0.61–1.24)
Chloride: 104 mmol/L (ref 101–111)
GFR calc Af Amer: >60 mL/min (ref >60)
GFR calc non Af Amer: >60 mL/min (ref >60)
GLUCOSE: 175 mg/dL — AB (ref 65–99)
Potassium: 3.4 mmol/L — ABNORMAL LOW (ref 3.5–5.1)
Sodium: 143 mmol/L (ref 135–145)
TOTAL PROTEIN: 6.9 g/dL (ref 6.5–8.1)

## 2017-05-08 LAB — URIC ACID: Uric Acid, Serum: 7.7 mg/dL — ABNORMAL HIGH (ref 4.4–7.6)

## 2017-05-08 LAB — MAGNESIUM: Magnesium: 1.9 mg/dL (ref 1.7–2.4)

## 2017-05-08 LAB — SEDIMENTATION RATE: SED RATE: 16 mm/h (ref 0–20)

## 2017-05-08 LAB — TROPONIN I: Troponin I: <0.03 ng/mL (ref <0.03)

## 2017-05-08 LAB — TSH: TSH: 1.388 u[IU]/mL (ref 0.350–4.500)

## 2017-05-08 MED ORDER — AMIODARONE HCL 200 MG PO TABS
400.0000 mg | ORAL_TABLET | Freq: Once | ORAL | Status: AC
  Administered 2017-05-09: 400 mg via ORAL
  Filled 2017-05-08: qty 2

## 2017-05-08 MED ORDER — HALOPERIDOL LACTATE 5 MG/ML IJ SOLN
1.0000 mg | Freq: Four times a day (QID) | INTRAMUSCULAR | Status: DC | PRN
  Administered 2017-05-08 – 2017-05-12 (×4): 1 mg via INTRAVENOUS
  Filled 2017-05-08 (×4): qty 1

## 2017-05-08 MED ORDER — AMIODARONE LOAD VIA INFUSION: 150.0000 mg | Freq: Once | INTRAVENOUS | Status: DC

## 2017-05-08 MED ORDER — AMIODARONE HCL IN DEXTROSE 360-4.14 MG/200ML-% IV SOLN: 60.0000 mg/h | INTRAVENOUS | Status: DC

## 2017-05-08 MED ORDER — METOPROLOL SUCCINATE ER 25 MG PO TB24
25.0000 mg | ORAL_TABLET | Freq: Every day | ORAL | Status: DC
  Administered 2017-05-08 – 2017-05-09 (×2): 25 mg via ORAL
  Filled 2017-05-08 (×2): qty 1

## 2017-05-08 MED ORDER — ENOXAPARIN SODIUM 40 MG/0.4ML ~~LOC~~ SOLN
40.0000 mg | SUBCUTANEOUS | Status: DC
  Administered 2017-05-08 – 2017-05-15 (×7): 40 mg via SUBCUTANEOUS
  Filled 2017-05-08 (×6): qty 0.4

## 2017-05-08 MED ORDER — ONDANSETRON HCL 4 MG PO TABS: 4.0000 mg | ORAL_TABLET | Freq: Four times a day (QID) | ORAL | Status: DC | PRN

## 2017-05-08 MED ORDER — AMIODARONE HCL IN DEXTROSE 360-4.14 MG/200ML-% IV SOLN
60.0000 mg/h | INTRAVENOUS | Status: DC
  Administered 2017-05-08: 60 mg/h via INTRAVENOUS
  Filled 2017-05-08: qty 200

## 2017-05-08 MED ORDER — ONDANSETRON HCL 4 MG/2ML IJ SOLN: 4.0000 mg | Freq: Four times a day (QID) | INTRAMUSCULAR | Status: DC | PRN

## 2017-05-08 MED ORDER — DEXTROSE 5 % IV SOLN
5.0000 mg/h | INTRAVENOUS | Status: DC
  Filled 2017-05-08: qty 100

## 2017-05-08 MED ORDER — OLANZAPINE 10 MG IM SOLR
5.0000 mg | Freq: Once | INTRAMUSCULAR | Status: AC
  Administered 2017-05-08: 5 mg via INTRAMUSCULAR
  Filled 2017-05-08: qty 10

## 2017-05-08 MED ORDER — AMIODARONE HCL IN DEXTROSE 360-4.14 MG/200ML-% IV SOLN: 30.0000 mg/h | INTRAVENOUS | Status: DC

## 2017-05-08 MED ORDER — SODIUM CHLORIDE 0.9 % IV BOLUS (SEPSIS)
500.0000 mL | Freq: Once | INTRAVENOUS | Status: AC
  Administered 2017-05-08: 500 mL via INTRAVENOUS

## 2017-05-08 MED ORDER — DILTIAZEM HCL 25 MG/5ML IV SOLN
15.0000 mg | Freq: Once | INTRAVENOUS | Status: AC
  Administered 2017-05-08: 15 mg via INTRAVENOUS
  Filled 2017-05-08: qty 5

## 2017-05-08 MED ORDER — POTASSIUM CHLORIDE CRYS ER 20 MEQ PO TBCR
40.0000 meq | EXTENDED_RELEASE_TABLET | Freq: Once | ORAL | Status: AC
  Administered 2017-05-08: 40 meq via ORAL
  Filled 2017-05-08: qty 2

## 2017-05-08 MED ORDER — ACETAMINOPHEN 650 MG RE SUPP: 650.0000 mg | Freq: Four times a day (QID) | RECTAL | Status: DC | PRN

## 2017-05-08 MED ORDER — ASPIRIN EC 81 MG PO TBEC
81.0000 mg | DELAYED_RELEASE_TABLET | Freq: Every day | ORAL | Status: DC
  Administered 2017-05-09 – 2017-05-16 (×8): 81 mg via ORAL
  Filled 2017-05-08 (×8): qty 1

## 2017-05-08 MED ORDER — SERTRALINE HCL 50 MG PO TABS
50.0000 mg | ORAL_TABLET | Freq: Every day | ORAL | Status: DC
  Administered 2017-05-08: 50 mg via ORAL
  Filled 2017-05-08: qty 1

## 2017-05-08 MED ORDER — IBUPROFEN 400 MG PO TABS: 400.0000 mg | ORAL_TABLET | Freq: Four times a day (QID) | ORAL | Status: DC | PRN

## 2017-05-08 MED ORDER — LEVETIRACETAM 500 MG PO TABS
500.0000 mg | ORAL_TABLET | Freq: Two times a day (BID) | ORAL | Status: DC
  Administered 2017-05-08 – 2017-05-10 (×4): 500 mg via ORAL
  Filled 2017-05-08 (×4): qty 1

## 2017-05-08 MED ORDER — ACETAMINOPHEN 325 MG PO TABS
650.0000 mg | ORAL_TABLET | Freq: Four times a day (QID) | ORAL | Status: DC | PRN
  Administered 2017-05-13: 650 mg via ORAL
  Filled 2017-05-08: qty 2

## 2017-05-08 MED ORDER — QUETIAPINE FUMARATE 25 MG PO TABS
25.0000 mg | ORAL_TABLET | Freq: Every day | ORAL | Status: DC
  Administered 2017-05-08: 25 mg via ORAL
  Filled 2017-05-08: qty 1

## 2017-05-08 MED ORDER — SIMVASTATIN 20 MG PO TABS
20.0000 mg | ORAL_TABLET | Freq: Every day | ORAL | Status: DC
  Administered 2017-05-08 – 2017-05-16 (×9): 20 mg via ORAL
  Filled 2017-05-08 (×9): qty 1

## 2017-05-08 MED ORDER — CEFAZOLIN SODIUM-DEXTROSE 1-4 GM/50ML-% IV SOLN
1.0000 g | Freq: Three times a day (TID) | INTRAVENOUS | Status: DC
  Filled 2017-05-08 (×4): qty 50

## 2017-05-08 MED ORDER — SODIUM CHLORIDE 0.9 % IV SOLN
INTRAVENOUS | Status: DC
  Administered 2017-05-08 – 2017-05-10 (×4): via INTRAVENOUS

## 2017-05-08 MED ORDER — RIVASTIGMINE TARTRATE 3 MG PO CAPS
3.0000 mg | ORAL_CAPSULE | Freq: Two times a day (BID) | ORAL | Status: DC
  Administered 2017-05-08: 3 mg via ORAL
  Filled 2017-05-08 (×2): qty 1

## 2017-05-08 MED ORDER — PANTOPRAZOLE SODIUM 40 MG PO TBEC
40.0000 mg | DELAYED_RELEASE_TABLET | Freq: Every day | ORAL | Status: DC
  Administered 2017-05-08 – 2017-05-16 (×9): 40 mg via ORAL
  Filled 2017-05-08 (×9): qty 1

## 2017-05-08 MED ORDER — TRAMADOL HCL 50 MG PO TABS: 50.0000 mg | ORAL_TABLET | Freq: Four times a day (QID) | ORAL | Status: DC | PRN

## 2017-05-08 NOTE — ED Provider Notes (Signed)
[facility] Emergency Department Provider Note  ____________________________________________   First MD Initiated Contact with Patient 05/08/17 1052     (approximate)  I have reviewed the triage vital signs and the nursing notes.   HISTORY  Chief Complaint Weakness  History is limited secondary to the patient's confusion  HPI William Lozano. is a 84 y.o. male who comes to the emergency department via EMS for increasing confusion.  Apparently the patient lives at home and had been living independently however for the past 24 hours or so he has had 24-hour care.  He was noted to have dark urine and increasing confusion so 911 was called.  The patient himself does not recognize that he is in a hospital and does not know why he is here.  Past Medical History:  Diagnosis Date  . Arthritis   . Atrial fibrillation (HCC) 2016   paroxysmal atrial fibrillation, sick sinus syndrome  . Complication of anesthesia    affects memory for a longer time after  . Dementia    alzheimers  . Essential hypertension   . GERD (gastroesophageal reflux disease)   . History of kidney stones   . Hyperlipemia   . Inguinal hernia recurrent bilateral   . Kidney stones   . Stroke [facility])     Patient Active Problem List   Diagnosis Date Noted  . 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 Dizziness 07/07/2016  . Cerebral hemorrhage(nontraumatic) (HCC) 06/30/2016  . Urolithiasis 03/28/2016  . Pyuria 03/28/2016  . Hydronephrosis 03/27/2016  . Cardiac arrhythmia 07/22/2015  . Atrial fibrillation with RVR (HCC) 02/24/2015  . Cardiac conduction disorder 02/04/2015  . Arthritis 02/04/2015  . Atrial fibrillation (HCC) 02/04/2015  . Acid reflux 02/04/2015  . H/O vertigo 02/04/2015  . HLD (hyperlipidemia) 02/04/2015  . BP (high blood pressure) 02/04/2015  . Calculus of kidney 02/04/2015  . Sick sinus syndrome (HCC) 02/04/2015  . Fungal infection of nail 02/04/2015  .  Type 2 diabetes mellitus (HCC) 02/04/2015  . Sepsis (HCC) 12/10/2014  . Systemic infection (HCC) 12/10/2014  . Sepsis(995.91) 12/10/2014  . Altered bowel function 10/13/2014  . Chronic diarrhea 10/13/2014  . Change in bowel habits 10/13/2014  . Senile dementia of Alzheimer's type 09/20/2014  . Alzheimer's dementia, late onset 09/20/2014  . Drug resistance 10/22/2013    Past Surgical History:  Procedure Laterality Date  . CYSTOSCOPY W/ RETROGRADES Bilateral 03/27/2016   Procedure: CYSTOSCOPY WITH RETROGRADE PYELOGRAM;  Surgeon: Vanna Scotland, MD;  Location: [facility] ORS;  Service: Urology;  Laterality: Bilateral;  . CYSTOSCOPY W/ URETERAL STENT PLACEMENT Bilateral 02/24/2015   Procedure: CYSTOSCOPY WITH STENT REPLACEMENT;  Surgeon: Hildred Laser, MD;  Location: [facility] ORS;  Service: Urology;  Laterality: Bilateral;  . CYSTOSCOPY W/ URETERAL STENT PLACEMENT Left 04/11/2016   Procedure: CYSTOSCOPY WITH STENT REPLACEMENT;  Surgeon: Vanna Scotland, MD;  Location: [facility] ORS;  Service: Urology;  Laterality: Left;  . CYSTOSCOPY W/ URETERAL STENT REMOVAL Right 04/11/2016   Procedure: CYSTOSCOPY WITH STENT REMOVAL;  Surgeon: Vanna Scotland, MD;  Location: [facility] ORS;  Service: Urology;  Laterality: Right;  . CYSTOSCOPY WITH RETROGRADE PYELOGRAM, URETEROSCOPY AND STENT PLACEMENT Bilateral 12/10/2014   Procedure: CYSTOSCOPY WITH RETROGRADE PYELOGRAM, URETEROSCOPY AND STENT PLACEMENT;  Surgeon: Hildred Laser, MD;  Location: [facility] ORS;  Service: Urology;  Laterality: Bilateral;  . CYSTOSCOPY WITH STENT PLACEMENT Bilateral 12/10/2014   Procedure: CYSTOSCOPY WITH STENT PLACEMENT;  Surgeon: Hildred Laser, MD;  Location: [facility] OR[facility];  Service: Urology;  Laterality: Bilateral;  . CYSTOSCOPY WITH STENT PLACEMENT Bilateral 03/27/2016   Procedure: CYSTOSCOPY WITH STENT PLACEMENT;  Surgeon: [name], MD;  Location: [facility];  Service: Urology;  Laterality: Bilateral;  . EXTRACORPOREAL SHOCK WAVE LITHOTRIPSY  Right 01/07/2015   Procedure: EXTRACORPOREAL SHOCK WAVE LITHOTRIPSY (ESWL);  Surgeon: [name], MD;  Location: [facility];  Service: Urology;  Laterality: Right;  . EXTRACORPOREAL SHOCK WAVE LITHOTRIPSY Left 01/21/2015   Procedure: EXTRACORPOREAL SHOCK WAVE LITHOTRIPSY (ESWL);  Surgeon: Hildred LaserBrian Debra Budzyn, MD;  Location: [facility];  Service: Urology;  Laterality: Left;  . EYE SURGERY Bilateral    Cataract Extraction with IOL  . HERNIA REPAIR Bilateral    inguinal  . URETEROSCOPY WITH HOLMIUM LASER LITHOTRIPSY Bilateral 02/24/2015   Procedure: URETEROSCOPY WITH HOLMIUM LASER LITHOTRIPSY /STONE BASKETING;  Surgeon: Hildred LaserBrian Marti Budzyn, MD;  Location: [facility];  Service: Urology;  Laterality: Bilateral;  . URETEROSCOPY WITH HOLMIUM LASER LITHOTRIPSY Bilateral 04/11/2016   Procedure: URETEROSCOPY WITH HOLMIUM LASER LITHOTRIPSY;  Surgeon: [name], MD;  Location: [facility] OR[facility];  Service: Urology;  Laterality: Bilateral;    Prior to Admission medications   Medication Sig Start Date End Date Taking? Authorizing Provider  aspirin EC 81 MG tablet Take 81 mg by mouth daily.    [provider]  hydrochlorothiazide (HYDRODIURIL) 25 MG tablet Take 25 mg by mouth daily.  04/12/16   [provider]  levETIRAcetam (KEPPRA) 500 MG tablet Take one tablet nightly 02/21/17   [provider]  metoprolol succinate (TOPROL-XL) 25 MG 24 hr tablet Take 25 mg by mouth daily.    [provider]  pantoprazole (PROTONIX) 40 MG tablet Take 40 mg by mouth daily.    [provider]  rivastigmine (EXELON) 3 MG capsule Take 3 mg by mouth 2 (two) times daily.     [provider]  sertraline (ZOLOFT) 25 MG tablet Take 25 mg by mouth daily.    [provider]  simvastatin (ZOCOR) 20 MG tablet Take 20 mg by mouth daily.    [provider]    Allergies Codeine; Penicillin g; and Sulfa antibiotics  Family History  Problem Relation Age of Onset  . Heart  attack Mother   . Heart attack Father   . Hypertension Unknown   . Kidney disease Neg Hx   . Prostate cancer Neg Hx     Social History Social History   Tobacco Use  . Smoking status: Never Smoker  . Smokeless tobacco: Never Used  Substance Use Topics  . Alcohol use: No    Alcohol/week: 0.0 oz  . Drug use: No    Review of Systems History is limited by the patient's clinical condition  ____________________________________________   PHYSICAL EXAM:  VITAL SIGNS: ED Triage Vitals  Enc Vitals Group     BP 05/08/17 1050 124/87     Pulse Rate 05/08/17 1050 81     Resp 05/08/17 1050 20     Temp 05/08/17 1050 98.4 F (36.9 C)     Temp Source 05/08/17 1050 Oral     SpO2 05/08/17 1050 98 %     Weight 05/08/17 1051 155 lb (70.3 kg)     Height --      Head Circumference --      Peak Flow --      Pain Score --      Pain Loc --      Pain Edu? --      Excl. in GC? --  Constitutional: Quite confused although not diaphoretic in no acute distress Eyes: PERRL EOMI. mid range and brisk Head: Atraumatic. Nose: No congestion/rhinnorhea. Mouth/Throat: No trismus Neck: No stridor.   Cardiovascular: Normal rate, regular rhythm. Grossly normal heart sounds.  Good peripheral circulation. Respiratory: Slightly increased respiratory effort.  No retractions. Lungs CTAB and moving good air Gastrointestinal: Soft nontender Musculoskeletal: No lower extremity edema   Neurologic:  Normal speech and language. No gross focal neurologic deficits are appreciated. Skin:  Skin is warm, dry and intact. No rash noted. Psychiatric: Profoundly confused   ____________________________________________   DIFFERENTIAL includes but not limited to  Urinary tract infection, subdural hematoma, sepsis, metabolic derangement ____________________________________________   LABS (all labs ordered are listed, but only abnormal results are displayed)  Labs Reviewed  URINE CULTURE  URINALYSIS, COMPLETE  (UACMP) WITH MICROSCOPIC  COMPREHENSIVE METABOLIC PANEL  TROPONIN I  CBC WITH DIFFERENTIAL/PLATELET    Lab work is pending __________________________________________  EKG  EKG is pending ____________________________________________  RADIOLOGY  CT of the head pending ____________________________________________   PROCEDURES  Procedure(s) performed: no  Procedures  Critical Care performed: no  Observation: no ____________________________________________   INITIAL IMPRESSION / ASSESSMENT AND PLAN / ED COURSE  Pertinent labs & imaging results that were available during my care of the patient were reviewed by me and considered in my medical decision making (see chart for details).  While the patient arrives hemodynamically stable, I have high clinical suspicion for infectious etiology of his delirium.  Also concern for subdural hematoma.  Broad workup is pending and care signed over to Dr. Alphonzo Lemmings.      ____________________________________________   FINAL CLINICAL IMPRESSION(S) / ED DIAGNOSES  Final diagnoses:  Delirium      NEW MEDICATIONS STARTED DURING THIS VISIT:  New Prescriptions   No medications on file     Note:  This document was prepared using Dragon voice recognition software and may include unintentional dictation errors.     Merrily Brittle, MD 05/08/17 1122

## 2017-05-08 NOTE — Progress Notes (Signed)
Pt has become more agitated and fidgity since his brother left for the night. Even with frequent checks by this nurse, pt has pulled out his only IV access along with his ID band and condom catheter. IV team called but pt is refusing to allow new acces to be started. Pt is trying to get out of bed and is confused disoriented to place situation and time. MD Caryn BeeMaier made aware. Seroquel 25 mg given, will continue to monitor.

## 2017-05-08 NOTE — H&P (Signed)
[facility] - [place] at [facility]   PATIENT NAME: William Lozano    MR#:  [id number]  DATE OF BIRTH:  12-10-1932  DATE OF ADMISSION:  05/08/2017  PRIMARY CARE PHYSICIAN: Marguarite Arbour, MD   REQUESTING/REFERRING PHYSICIAN: Jeanmarie Plant, MD  CHIEF COMPLAINT:   Chief Complaint  Patient presents with  . Weakness    HISTORY OF PRESENT ILLNESS: Pesach Frisch  is a 84 y.o. male with a known history of atrial fibrillation, osteoarthritis, dementia, essential hypertension, GERD, Recurrent kidney stones and a previous history of stroke who is brought by his family due to progressive confusion and agitation.  Patient's had a fall last month and according to his family friend and his brother who is here state that he is gotten worse since then.  Patient has not had much pain however has had difficulty with ambulation.  He has not been eating or drinking much.  In the emergency room he was noticed to have A. fib with RVR.  He appears very dehydrated.  He also has leukocytosis however there is no evidence of infection.  Patient is restless trying to get out of bed unable to provide any review of systems   PAST MEDICAL HISTORY:   Past Medical History:  Diagnosis Date  . Arthritis   . Atrial fibrillation (HCC) 2016   paroxysmal atrial fibrillation, sick sinus syndrome  . Complication of anesthesia    affects memory for a longer time after  . Dementia    alzheimers  . Essential hypertension   . GERD (gastroesophageal reflux disease)   . History of kidney stones   . Hyperlipemia   . Inguinal hernia recurrent bilateral   . Kidney stones   . Stroke [facility])     PAST SURGICAL HISTORY:  Past Surgical History:  Procedure Laterality Date  . CYSTOSCOPY W/ RETROGRADES Bilateral 03/27/2016   Procedure: CYSTOSCOPY WITH RETROGRADE PYELOGRAM;  Surgeon: Vanna Scotland, MD;  Location: [facility] OR[facility];  Service: Urology;  Laterality: Bilateral;  . CYSTOSCOPY W/ URETERAL STENT PLACEMENT Bilateral  02/24/2015   Procedure: CYSTOSCOPY WITH STENT REPLACEMENT;  Surgeon: Hildred Laser, MD;  Location: [facility];  Service: Urology;  Laterality: Bilateral;  . CYSTOSCOPY W/ URETERAL STENT PLACEMENT Left 04/11/2016   Procedure: CYSTOSCOPY WITH STENT REPLACEMENT;  Surgeon: Vanna Scotland, MD;  Location: [facility];  Service: Urology;  Laterality: Left;  . CYSTOSCOPY W/ URETERAL STENT REMOVAL Right 04/11/2016   Procedure: CYSTOSCOPY WITH STENT REMOVAL;  Surgeon: Vanna Scotland, MD;  Location: [facility];  Service: Urology;  Laterality: Right;  . CYSTOSCOPY WITH RETROGRADE PYELOGRAM, URETEROSCOPY AND STENT PLACEMENT Bilateral 12/10/2014   Procedure: CYSTOSCOPY WITH RETROGRADE PYELOGRAM, URETEROSCOPY AND STENT PLACEMENT;  Surgeon: Hildred Laser, MD;  Location: [facility];  Service: Urology;  Laterality: Bilateral;  . CYSTOSCOPY WITH STENT PLACEMENT Bilateral 12/10/2014   Procedure: CYSTOSCOPY WITH STENT PLACEMENT;  Surgeon: Hildred Laser, MD;  Location: [facility];  Service: Urology;  Laterality: Bilateral;  . CYSTOSCOPY WITH STENT PLACEMENT Bilateral 03/27/2016   Procedure: CYSTOSCOPY WITH STENT PLACEMENT;  Surgeon: Vanna Scotland, MD;  Location: [facility];  Service: Urology;  Laterality: Bilateral;  . EXTRACORPOREAL SHOCK WAVE LITHOTRIPSY Right 01/07/2015   Procedure: EXTRACORPOREAL SHOCK WAVE LITHOTRIPSY (ESWL);  Surgeon: Lorraine Lax, MD;  Location: [facility];  Service: Urology;  Laterality: Right;  . EXTRACORPOREAL SHOCK WAVE LITHOTRIPSY Left 01/21/2015   Procedure: EXTRACORPOREAL SHOCK WAVE LITHOTRIPSY (ESWL);  Surgeon: Hildred Laser, MD;  Location: [facility];  Service: Urology;  Laterality: Left;  . EYE SURGERY Bilateral    Cataract Extraction with IOL  . HERNIA REPAIR Bilateral    inguinal  . URETEROSCOPY WITH HOLMIUM LASER LITHOTRIPSY Bilateral 02/24/2015   Procedure: URETEROSCOPY WITH HOLMIUM LASER LITHOTRIPSY /STONE BASKETING;  Surgeon: Hildred LaserBrian Isaac Budzyn, MD;  Location: [facility];  Service:  Urology;  Laterality: Bilateral;  . URETEROSCOPY WITH HOLMIUM LASER LITHOTRIPSY Bilateral 04/11/2016   Procedure: URETEROSCOPY WITH HOLMIUM LASER LITHOTRIPSY;  Surgeon: [name], MD;  Location: [facility];  Service: Urology;  Laterality: Bilateral;    SOCIAL HISTORY:  Social History   Tobacco Use  . Smoking status: Never Smoker  . Smokeless tobacco: Never Used  Substance Use Topics  . Alcohol use: No    Alcohol/week: 0.0 oz    FAMILY HISTORY:  Family History  Problem Relation Age of Onset  . Heart attack Mother   . Heart attack Father   . Hypertension Unknown   . Kidney disease Neg Hx   . Prostate cancer Neg Hx     DRUG ALLERGIES:  Allergies  Allergen Reactions  . Codeine Other (See Comments)    Causes side effects  . Penicillin G Rash    Has patient had a PCN reaction causing immediate rash, facial/tongue/throat swelling, SOB or lightheadedness with hypotension: Yes Has patient had a PCN reaction causing severe rash involving mucus membranes or skin necrosis: No Has patient had a PCN reaction that required hospitalization: No Has patient had a PCN reaction occurring within the last 10 years: No If all of the above answers are "NO", then may proceed with Cephalosporin use.  . Sulfa Antibiotics Rash    Pt and fam are saying he does not have allergy to sulfa now.    REVIEW OF SYSTEMS:   CONSTITUTIONAL: Confused MEDICATIONS AT HOME:  Prior to Admission medications   Medication Sig Start Date End Date Taking? Authorizing Provider  aspirin EC 81 MG tablet Take 81 mg by mouth daily.   Yes [provider]  diphenoxylate-atropine (LOMOTIL) 2.5-0.025 MG tablet Take 1 tablet by mouth 4 (four) times daily as needed for diarrhea or loose stools.   Yes [provider]  hydrochlorothiazide (HYDRODIURIL) 25 MG tablet Take 12.5 mg by mouth daily.  04/12/16  Yes [provider]  levETIRAcetam (KEPPRA) 500 MG tablet Take one tablet nightly 02/21/17  Yes  [provider]  metoprolol succinate (TOPROL-XL) 25 MG 24 hr tablet Take 25 mg by mouth daily.   Yes [provider]  pantoprazole (PROTONIX) 40 MG tablet Take 40 mg by mouth daily.   Yes [provider]  QUEtiapine (SEROQUEL) 25 MG tablet Take 25 mg by mouth at bedtime.   Yes [provider]  rivastigmine (EXELON) 1.5 MG capsule Take 3 mg by mouth 2 (two) times daily.    Yes [provider]  sertraline (ZOLOFT) 50 MG tablet Take 1 tablet by mouth daily. 04/30/17  Yes [provider]  simvastatin (ZOCOR) 20 MG tablet Take 20 mg by mouth daily.   Yes [provider]  traMADol (ULTRAM) 50 MG tablet Take 50 mg by mouth every 6 (six) hours as needed.   Yes [provider]      PHYSICAL EXAMINATION:   VITAL SIGNS: Blood pressure 112/87, pulse 81, temperature 98.4 F (36.9 C), temperature source Oral, resp. rate 19, weight 155 lb (70.3 kg), SpO2 98 %.  GENERAL:  84 y.o.-year-old patient lying in the bed agitated EYES: Pupils equal, round, reactive to light and  accommodation. No scleral icterus. Extraocular muscles intact.  HEENT: Head atraumatic, normocephalic. Oropharynx and nasopharynx clear.  NECK:  Supple, no jugular venous distention. No thyroid enlargement, no tenderness.  LUNGS: Normal breath sounds bilaterally, no wheezing, rales,rhonchi or crepitation. No use of accessory muscles of respiration.  CARDIOVASCULAR: Irregularly irregular no murmurs, rubs, or gallops.  ABDOMEN: Soft, nontender, nondistended. Bowel sounds present. No organomegaly or mass.  EXTREMITIES: No pedal edema, cyanosis, or clubbing.  NEUROLOGIC: Unable to do a complete neuro exam spontaneously moving all extremities PSYCHIATRIC: Patient is agitated SKIN: No obvious rash, lesion, or ulcer.   LABORATORY PANEL:   CBC Recent Labs  Lab 05/08/17 1211  WBC 16.6*  HGB 14.8  HCT 44.9  PLT 352  MCV 92.0  MCH 30.3  MCHC 32.9  RDW 15.2*   LYMPHSABS 0.7*  MONOABS 1.4*  EOSABS 0.0  BASOSABS 0.1   ------------------------------------------------------------------------------------------------------------------  Chemistries  Recent Labs  Lab 05/08/17 1211  NA 143  K 3.4*  CL 104  CO2 27  GLUCOSE 175*  BUN 24*  CREATININE 1.05  CALCIUM 9.3  AST 46*  ALT 14*  ALKPHOS 79  BILITOT 1.5*   ------------------------------------------------------------------------------------------------------------------ estimated creatinine clearance is 53.9 mL/min (by C-G formula based on SCr of 1.05 mg/dL). ------------------------------------------------------------------------------------------------------------------ No results for input(s): TSH, T4TOTAL, T3FREE, THYROIDAB in the last 72 hours.  Invalid input(s): FREET3   Coagulation profile No results for input(s): INR, PROTIME in the last 168 hours. ------------------------------------------------------------------------------------------------------------------- No results for input(s): DDIMER in the last 72 hours. -------------------------------------------------------------------------------------------------------------------  Cardiac Enzymes Recent Labs  Lab 05/08/17 1211  TROPONINI <0.03   ------------------------------------------------------------------------------------------------------------------ Invalid input(s): POCBNP  ---------------------------------------------------------------------------------------------------------------  Urinalysis    Component Value Date/Time   COLORURINE YELLOW (A) 05/08/2017 1509   APPEARANCEUR CLEAR (A) 05/08/2017 1509   APPEARANCEUR Cloudy (A) 04/18/2016 1525   LABSPEC 1.019 05/08/2017 1509   PHURINE 5.0 05/08/2017 1509   GLUCOSEU NEGATIVE 05/08/2017 1509   HGBUR SMALL (A) 05/08/2017 1509   BILIRUBINUR NEGATIVE 05/08/2017 1509   BILIRUBINUR Negative 04/18/2016 1525   KETONESUR NEGATIVE 05/08/2017 1509   PROTEINUR  30 (A) 05/08/2017 1509   NITRITE NEGATIVE 05/08/2017 1509   LEUKOCYTESUR NEGATIVE 05/08/2017 1509   LEUKOCYTESUR Trace (A) 04/18/2016 1525     RADIOLOGY: Dg Chest 1 View  Result Date: 05/08/2017 CLINICAL DATA:  84 year old male with confusion.  Initial encounter. EXAM: CHEST  1 VIEW COMPARISON:  04/12/2016 chest x-ray. FINDINGS: Cardiomegaly. Stable central pulmonary vascular prominence. Stable chronic lung changes. No infiltrate, congestive heart failure or pneumothorax. No plain film evidence of pulmonary malignancy. Calcified mildly tortuous aorta. No acute osseous abnormality. IMPRESSION: Cardiomegaly. Stable central pulmonary vascular prominence. Stable chronic lung changes. Aortic Atherosclerosis (ICD10-I70.0). Electronically Signed   By: Lacy Duverney M.D.   On: 05/08/2017 11:31   Ct Head Wo Contrast  Result Date: 05/08/2017 CLINICAL DATA:  Increasing confusion EXAM: CT HEAD WITHOUT CONTRAST TECHNIQUE: Contiguous axial images were obtained from the base of the skull through the vertex without intravenous contrast. COMPARISON:  04/06/2017 FINDINGS: Brain: Diffuse atrophic changes are noted. Changes of prior ischemia in the left occipital lobe with encephalomalacia are again noted. No findings to suggest acute hemorrhage, acute infarction or space-occupying mass lesion are seen. Vascular: No hyperdense vessel or unexpected calcification. Skull: Normal. Negative for fracture or focal lesion. Sinuses/Orbits: Mild mucosal changes are noted in the right maxillary antrum stable from the prior exam. Other: None IMPRESSION: Chronic changes without acute abnormality. Electronically Signed   By: Eulah Pont.D.  On: 05/08/2017 11:39    EKG: Orders placed or performed during the hospital encounter of 05/08/17  . ED EKG  . ED EKG  . EKG 12-Lead  . EKG 12-Lead    IMPRESSION AND PLAN: Patient is a 84 year old white male presenting to the ER with worsening confusion  1.  A. fib with RVR I will  start patient on IV Cardizem drip heart rate continues to be intermittently 130s 140 Continue aspirin not a candidate for full dose anticoagulation due to fall risk Cardiology consult  2.  Acute encephalopathy with underlying dementia Suspect related to dehydration recent fall and progressive decline No evidence of infection noted  3.  Leukocytosis possibly due to stress reaction urinalysis and chest x-ray are negative Monitor temperature Pete CBC in the morning  4.  Essential hypertension continue metoprolol stop HCTZ  5.  Dementia continue Seroquel and Exelon and Zoloft  6.  Hyperlipidemia continue Zocor  7. misc : Lovenox for DVT prophylaxis   All the records are reviewed and case discussed with ED provider. Management plans discussed with the patient, family and they are in agreement.  CODE STATUS: Code Status History    Date Active Date Inactive Code Status Order ID Comments User Context   07/07/2016 18:36 07/09/2016 13:04 Full Code [id number]  Katha Hamming, MD ED   04/12/2016 23:28 04/15/2016 17:44 Full Code [id number]  Oralia Manis, MD Inpatient   03/27/2016 17:09 03/28/2016 22:09 Full Code [id number]  Ramonita Lab, MD Inpatient   02/24/2015 15:23 02/25/2015 17:43 Full Code [id number]  Katharina Caper, MD Inpatient   12/10/2014 06:46 12/14/2014 14:16 Full Code [id number]  Arnaldo Natal, MD Inpatient       TOTAL TIME TAKING CARE OF THIS PATIENT: 55 minutes.    Auburn Bilberry M.D on 05/08/2017 at 4:21 PM  Between 7am to 6pm - Pager - [phone]  After 6pm go to [email or web address] - password [email or web address]  [facility] Office  [phone]  CC: Primary care physician; Marguarite Arbour, MD

## 2017-05-08 NOTE — ED Triage Notes (Signed)
Pt to ed with c/o confusion more than usual, weakness, and dark foul smelling urine.  Pt brought in by [facility] from home.  Per EMS pt has been living independently until a few days ago and 24 hour care was established for him in the home.  Pt alert but confused to person, place, time and situation.  Pt speaking in incoherent statements.  Skin warm and dry.

## 2017-05-08 NOTE — Progress Notes (Signed)
BP 97/71, HR uncontrolled 110's-140's with frequent PVCs, 5 beat runs VT, MD notified, orders to discontinue cardizem and start on amiodarone gtt and give PO potassium, will give and continue to monitor.

## 2017-05-08 NOTE — Consult Note (Signed)
Cardiology Consultation Note    Patient ID: William Oien., MRN: [id number], DOB/AGE: September 25, 1932 84 y.o. Admit date: 05/08/2017   Date of Consult: 05/08/2017 Primary Physician: Marguarite Arbour, MD Primary Cardiologist:    Chief Complaint: afib/altered mental status Reason for Consultation: afib Requesting MD: Dr. Katheren Shams  HPI: William DOLINSKY Sr. is a 84 y.o. male with history of hypertension, sicksinus syndrome, hyperlipidemia, intracranial hemorrhage secondary to cavernous venous malformation who was admitted with afib and worsening mental status. He has not been a candidate for chornic anticoagulaton due to his intracranial bleeding risk. He has recently been anorexic and taking in very little food or liquid. He is not able to give a very good history. He is currently on amiodarone at 400 mg daily, asa, metoprolol 50 daily. EKG shows afib with ventricular rate 100-110,   Past Medical History:  Diagnosis Date  . Arthritis   . Atrial fibrillation (HCC) 2016   paroxysmal atrial fibrillation, sick sinus syndrome  . Complication of anesthesia    affects memory for a longer time after  . Dementia    alzheimers  . Essential hypertension   . GERD (gastroesophageal reflux disease)   . History of kidney stones   . Hyperlipemia   . Inguinal hernia recurrent bilateral   . Kidney stones   . Stroke [facility])       Surgical History:  Past Surgical History:  Procedure Laterality Date  . CYSTOSCOPY W/ RETROGRADES Bilateral 03/27/2016   Procedure: CYSTOSCOPY WITH RETROGRADE PYELOGRAM;  Surgeon: Vanna Scotland, MD;  Location: [facility];  Service: Urology;  Laterality: Bilateral;  . CYSTOSCOPY W/ URETERAL STENT PLACEMENT Bilateral 02/24/2015   Procedure: CYSTOSCOPY WITH STENT REPLACEMENT;  Surgeon: Hildred Laser, MD;  Location: [facility];  Service: Urology;  Laterality: Bilateral;  . CYSTOSCOPY W/ URETERAL STENT PLACEMENT Left 04/11/2016   Procedure: CYSTOSCOPY WITH STENT REPLACEMENT;  Surgeon:  Vanna Scotland, MD;  Location: [facility];  Service: Urology;  Laterality: Left;  . CYSTOSCOPY W/ URETERAL STENT REMOVAL Right 04/11/2016   Procedure: CYSTOSCOPY WITH STENT REMOVAL;  Surgeon: Vanna Scotland, MD;  Location: [facility];  Service: Urology;  Laterality: Right;  . CYSTOSCOPY WITH RETROGRADE PYELOGRAM, URETEROSCOPY AND STENT PLACEMENT Bilateral 12/10/2014   Procedure: CYSTOSCOPY WITH RETROGRADE PYELOGRAM, URETEROSCOPY AND STENT PLACEMENT;  Surgeon: Hildred Laser, MD;  Location: [facility];  Service: Urology;  Laterality: Bilateral;  . CYSTOSCOPY WITH STENT PLACEMENT Bilateral 12/10/2014   Procedure: CYSTOSCOPY WITH STENT PLACEMENT;  Surgeon: Hildred Laser, MD;  Location: [facility];  Service: Urology;  Laterality: Bilateral;  . CYSTOSCOPY WITH STENT PLACEMENT Bilateral 03/27/2016   Procedure: CYSTOSCOPY WITH STENT PLACEMENT;  Surgeon: Vanna Scotland, MD;  Location: [facility];  Service: Urology;  Laterality: Bilateral;  . EXTRACORPOREAL SHOCK WAVE LITHOTRIPSY Right 01/07/2015   Procedure: EXTRACORPOREAL SHOCK WAVE LITHOTRIPSY (ESWL);  Surgeon: Lorraine Lax, MD;  Location: [facility];  Service: Urology;  Laterality: Right;  . EXTRACORPOREAL SHOCK WAVE LITHOTRIPSY Left 01/21/2015   Procedure: EXTRACORPOREAL SHOCK WAVE LITHOTRIPSY (ESWL);  Surgeon: Hildred Laser, MD;  Location: [facility];  Service: Urology;  Laterality: Left;  . EYE SURGERY Bilateral    Cataract Extraction with IOL  . HERNIA REPAIR Bilateral    inguinal  . URETEROSCOPY WITH HOLMIUM LASER LITHOTRIPSY Bilateral 02/24/2015   Procedure: URETEROSCOPY WITH HOLMIUM LASER LITHOTRIPSY /STONE BASKETING;  Surgeon: Hildred Laser, MD;  Location: [facility];  Service: Urology;  Laterality: Bilateral;  . URETEROSCOPY WITH HOLMIUM LASER LITHOTRIPSY Bilateral  04/11/2016   Procedure: URETEROSCOPY WITH HOLMIUM LASER LITHOTRIPSY;  Surgeon: Vanna Scotland, MD;  Location: [facility];  Service: Urology;  Laterality: Bilateral;     Home  Meds: Prior to Admission medications   Medication Sig Start Date End Date Taking? Authorizing Provider  aspirin EC 81 MG tablet Take 81 mg by mouth daily.   Yes [provider]  diphenoxylate-atropine (LOMOTIL) 2.5-0.025 MG tablet Take 1 tablet by mouth 4 (four) times daily as needed for diarrhea or loose stools.   Yes [provider]  hydrochlorothiazide (HYDRODIURIL) 25 MG tablet Take 12.5 mg by mouth daily.  04/12/16  Yes [provider]  levETIRAcetam (KEPPRA) 500 MG tablet Take one tablet nightly 02/21/17  Yes [provider]  metoprolol succinate (TOPROL-XL) 25 MG 24 hr tablet Take 25 mg by mouth daily.   Yes [provider]  pantoprazole (PROTONIX) 40 MG tablet Take 40 mg by mouth daily.   Yes [provider]  QUEtiapine (SEROQUEL) 25 MG tablet Take 25 mg by mouth at bedtime.   Yes [provider]  rivastigmine (EXELON) 1.5 MG capsule Take 3 mg by mouth 2 (two) times daily.    Yes [provider]  sertraline (ZOLOFT) 50 MG tablet Take 1 tablet by mouth daily. 04/30/17  Yes [provider]  simvastatin (ZOCOR) 20 MG tablet Take 20 mg by mouth daily.   Yes [provider]  traMADol (ULTRAM) 50 MG tablet Take 50 mg by mouth every 6 (six) hours as needed.   Yes [provider]    Inpatient Medications:  . [START ON 05/09/2017] aspirin EC  81 mg Oral Daily  . enoxaparin (LOVENOX) injection  40 mg Subcutaneous Q24H  . levETIRAcetam  500 mg Oral BID  . metoprolol succinate  25 mg Oral Daily  . pantoprazole  40 mg Oral QAC breakfast  . QUEtiapine  25 mg Oral QHS  . rivastigmine  3 mg Oral BID WC  . sertraline  50 mg Oral Daily  . simvastatin  20 mg Oral Daily   . sodium chloride 100 mL/hr at 05/08/17 1735  . amiodarone 60 mg/hr (05/08/17 1825)  . [START ON 05/09/2017] amiodarone    .  ceFAZolin (ANCEF) IV      Allergies:  Allergies  Allergen Reactions  . Codeine Other (See Comments)     Causes side effects  . Penicillin G Rash    Has patient had a PCN reaction causing immediate rash, facial/tongue/throat swelling, SOB or lightheadedness with hypotension: Yes Has patient had a PCN reaction causing severe rash involving mucus membranes or skin necrosis: No Has patient had a PCN reaction that required hospitalization: No Has patient had a PCN reaction occurring within the last 10 years: No If all of the above answers are "NO", then may proceed with Cephalosporin use.  . Sulfa Antibiotics Rash    Pt and fam are saying he does not have allergy to sulfa now.    Social History   Socioeconomic History  . Marital status: Widowed    Spouse name: Not on file  . Number of children: Not on file  . Years of education: Not on file  . Highest education level: Not on file  Social Needs  . Financial resource strain: Not on file  . Food insecurity - worry: Not on file  . Food insecurity - inability: Not on file  . Transportation needs - medical: Not on file  . Transportation needs - non-medical: Not on file  Occupational History  . Not on file  Tobacco Use  . Smoking status: Never Smoker  . Smokeless tobacco: Never Used  Substance and Sexual Activity  . Alcohol use: No    Alcohol/week: 0.0 oz  . Drug use: No  . Sexual activity: Not on file  Other Topics Concern  . Not on file  Social History Narrative  . Not on file     Family History  Problem Relation Age of Onset  . Heart attack Mother   . Heart attack Father   . Hypertension Unknown   . Kidney disease Neg Hx   . Prostate cancer Neg Hx      Review of Systems: A 12-system review of systems was performed and is negative except as noted in the HPI.  Labs: Recent Labs    05/08/17 1211  TROPONINI <0.03   Lab Results  Component Value Date   WBC 16.6 (H) 05/08/2017   HGB 14.8 05/08/2017   HCT 44.9 05/08/2017   MCV 92.0 05/08/2017   PLT 352 05/08/2017    Recent Labs  Lab 05/08/17 1211  NA 143  K 3.4*   CL 104  CO2 27  BUN 24*  CREATININE 1.05  CALCIUM 9.3  PROT 6.9  BILITOT 1.5*  ALKPHOS 79  ALT 14*  AST 46*  GLUCOSE 175*   Lab Results  Component Value Date   CHOL 147 07/08/2016   HDL 26 (L) 07/08/2016   LDLCALC 96 07/08/2016   TRIG 126 07/08/2016   No results found for: DDIMER  Radiology/Studies:  Dg Chest 1 View  Result Date: 05/08/2017 CLINICAL DATA:  84 year old male with confusion.  Initial encounter. EXAM: CHEST  1 VIEW COMPARISON:  04/12/2016 chest x-ray. FINDINGS: Cardiomegaly. Stable central pulmonary vascular prominence. Stable chronic lung changes. No infiltrate, congestive heart failure or pneumothorax. No plain film evidence of pulmonary malignancy. Calcified mildly tortuous aorta. No acute osseous abnormality. IMPRESSION: Cardiomegaly. Stable central pulmonary vascular prominence. Stable chronic lung changes. Aortic Atherosclerosis (ICD10-I70.0). Electronically Signed   By: Lacy Duverney M.D.   On: 05/08/2017 11:31   Ct Head Wo Contrast  Result Date: 05/08/2017 CLINICAL DATA:  Increasing confusion EXAM: CT HEAD WITHOUT CONTRAST TECHNIQUE: Contiguous axial images were obtained from the base of the skull through the vertex without intravenous contrast. COMPARISON:  04/06/2017 FINDINGS: Brain: Diffuse atrophic changes are noted. Changes of prior ischemia in the left occipital lobe with encephalomalacia are again noted. No findings to suggest acute hemorrhage, acute infarction or space-occupying mass lesion are seen. Vascular: No hyperdense vessel or unexpected calcification. Skull: Normal. Negative for fracture or focal lesion. Sinuses/Orbits: Mild mucosal changes are noted in the right maxillary antrum stable from the prior exam. Other: None IMPRESSION: Chronic changes without acute abnormality. Electronically Signed   By: Alcide Clever M.D.   On: 05/08/2017 11:39   Dg Foot 2 Views Right  Result Date: 05/08/2017 CLINICAL DATA:  Right foot pain after fall last month.  Difficulty ambulating. EXAM: RIGHT FOOT - 2 VIEW COMPARISON:  None. FINDINGS: There is no evidence of fracture or dislocation. Moderate-to-marked joint space narrowing with bone-on-bone apposition of the first MTP articulation. Osteophytes are noted dorsally across this joint with mild periarticular calcifications possibly representing ligamentous calcifications. No suspicious osseous lesions. Tiny enthesophyte off the dorsum of the calcaneus. Mild joint space narrowing of the tibiotalar, subtalar and midfoot articulations. There is slight overlap of the fourth and fifth toes with splayed appearance of the webspace between the third and fourth toes.  No definite soft tissue mass is seen accounting for this appearance. Soft tissues are unremarkable. IMPRESSION: Mild degenerative joint space narrowing of the tibiotalar, subtalar and midfoot articulations. Moderate-to-marked joint space narrowing with osteophyte formation and bone-on-bone apposition of the first MTP is identified. No acute fracture joint dislocations. Electronically Signed   By: Tollie Ethavid  Kwon M.D.   On: 05/08/2017 18:33    Wt Readings from Last 3 Encounters:  05/08/17 67.9 kg (149 lb 11.1 oz)  04/06/17 70.3 kg (155 lb)  03/02/17 69.9 kg (154 lb)    EKG: afib with variable vr  Physical Exam:  Blood pressure 114/81, pulse 98, temperature 98.2 F (36.8 C), temperature source Oral, resp. rate 17, weight 67.9 kg (149 lb 11.1 oz), SpO2 98 %. Body mass index is 20.3 kg/m. General: Well developed, well nourished, in no acute distress. Head: Normocephalic, atraumatic, sclera non-icteric, no xanthomas, nares are without discharge.  Neck: Negative for carotid bruits. JVD not elevated. Lungs: Clear bilaterally to auscultation without wheezes, rales, or rhonchi. Breathing is unlabored. Heart: irr, irr rhythm No murmurs, rubs, or gallops appreciated. Abdomen: Soft, non-tender, non-distended with normoactive bowel sounds. No hepatomegaly. No  rebound/guarding. No obvious abdominal masses. Msk:  Strength and tone appear normal for age. Extremities: No clubbing or cyanosis. No edema.  Distal pedal pulses are 2+ and equal bilaterally. Neuro: Confused. . No facial asymmetry. No focal deficit. Moves all extremities spontaneously.      Assessment and Plan  84 yo male with history of paroxysmal afib, history of cva who was admitted with change in mental status per wife. Pt not able to give much history. He has not been anticoagulated due to falls and history of intracranial bleeding. He is on asa alone. He has been anorexic and has not taken much po in the past several days to week. He was noted to be in afib with rvr. He was place on iv cardizem drip with no significant improvement and was changed to a iv amiodarone drip. He is still somewhat hypotensive. Careful hydration following exam and continuing with amiodarone load iv. Will Defer anticoagulation due to intracranial bleed risk.  Signed, Dalia HeadingKenneth A Talyn Eddie MD 05/08/2017, 9:26 PM Pager: [phone]

## 2017-05-08 NOTE — Progress Notes (Signed)
Nurse noticed right ankle foot swollen and tender  Check xray Check esr Possible cellultis- iv ancef Possible gout, check uric acid level

## 2017-05-08 NOTE — ED Provider Notes (Addendum)
[facility] Emergency Department Provider Note  ____________________________________________   I have reviewed the triage vital signs and the nursing notes. Where available I have reviewed prior notes and, if possible and indicated, outside hospital notes.    HISTORY  Chief Complaint Weakness    HPI William Lozano. is a 84 y.o. male  Of atrial fibrillation, history of dementia, Alzheimer's reflux disease kidney stones, CVA in the past, patient went to see his urologist today they found him to be acutely and altered mental status and they called me feeling the patient needed to be admitted for an MRI.  Family states that he has been confused for the last several days over his baseline.  No clear etiology given.  He has had "strong urine" he is having decreased p.o., and normally can recognize his family members he is having trouble doing this.  Is not had any known fevers or vomiting.  Patient has significant dementia, cannot give me any further information.  Level 5 chart caveat; no further history available due to patient status.       Past Medical History:  Diagnosis Date  . Arthritis   . Atrial fibrillation (HCC) 2016   paroxysmal atrial fibrillation, sick sinus syndrome  . Complication of anesthesia    affects memory for a longer time after  . Dementia    alzheimers  . Essential hypertension   . GERD (gastroesophageal reflux disease)   . History of kidney stones   . Hyperlipemia   . Inguinal hernia recurrent bilateral   . Kidney stones   . Stroke [facility])     Patient Active Problem List   Diagnosis Date Noted  . 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 Dizziness 07/07/2016  . Cerebral hemorrhage(nontraumatic) (HCC) 06/30/2016  . Urolithiasis 03/28/2016  . Pyuria 03/28/2016  . Hydronephrosis 03/27/2016  . Cardiac arrhythmia 07/22/2015  . Atrial fibrillation with RVR (HCC) 02/24/2015  . Cardiac conduction disorder 02/04/2015  .  Arthritis 02/04/2015  . Atrial fibrillation (HCC) 02/04/2015  . Acid reflux 02/04/2015  . H/O vertigo 02/04/2015  . HLD (hyperlipidemia) 02/04/2015  . BP (high blood pressure) 02/04/2015  . Calculus of kidney 02/04/2015  . Sick sinus syndrome (HCC) 02/04/2015  . Fungal infection of nail 02/04/2015  . Type 2 diabetes mellitus (HCC) 02/04/2015  . Sepsis (HCC) 12/10/2014  . Systemic infection (HCC) 12/10/2014  . Sepsis(995.91) 12/10/2014  . Altered bowel function 10/13/2014  . Chronic diarrhea 10/13/2014  . Change in bowel habits 10/13/2014  . Senile dementia of Alzheimer's type 09/20/2014  . Alzheimer's dementia, late onset 09/20/2014  . Drug resistance 10/22/2013    Past Surgical History:  Procedure Laterality Date  . CYSTOSCOPY W/ RETROGRADES Bilateral 03/27/2016   Procedure: CYSTOSCOPY WITH RETROGRADE PYELOGRAM;  Surgeon: Vanna Scotland, MD;  Location: [facility] ORS;  Service: Urology;  Laterality: Bilateral;  . CYSTOSCOPY W/ URETERAL STENT PLACEMENT Bilateral 02/24/2015   Procedure: CYSTOSCOPY WITH STENT REPLACEMENT;  Surgeon: Hildred Laser, MD;  Location: [facility] ORS;  Service: Urology;  Laterality: Bilateral;  . CYSTOSCOPY W/ URETERAL STENT PLACEMENT Left 04/11/2016   Procedure: CYSTOSCOPY WITH STENT REPLACEMENT;  Surgeon: Vanna Scotland, MD;  Location: [facility] ORS;  Service: Urology;  Laterality: Left;  . CYSTOSCOPY W/ URETERAL STENT REMOVAL Right 04/11/2016   Procedure: CYSTOSCOPY WITH STENT REMOVAL;  Surgeon: Vanna Scotland, MD;  Location: [facility] ORS;  Service: Urology;  Laterality: Right;  . CYSTOSCOPY WITH RETROGRADE PYELOGRAM, URETEROSCOPY AND STENT PLACEMENT Bilateral 12/10/2014   Procedure: CYSTOSCOPY  WITH RETROGRADE PYELOGRAM, URETEROSCOPY AND STENT PLACEMENT;  Surgeon: Hildred Laser, MD;  Location: [facility] ORS;  Service: Urology;  Laterality: Bilateral;  . CYSTOSCOPY WITH STENT PLACEMENT Bilateral 12/10/2014   Procedure: CYSTOSCOPY WITH STENT PLACEMENT;  Surgeon: Hildred Laser, MD;  Location: [facility] ORS;  Service: Urology;  Laterality: Bilateral;  . CYSTOSCOPY WITH STENT PLACEMENT Bilateral 03/27/2016   Procedure: CYSTOSCOPY WITH STENT PLACEMENT;  Surgeon: Vanna Scotland, MD;  Location: [facility] ORS;  Service: Urology;  Laterality: Bilateral;  . EXTRACORPOREAL SHOCK WAVE LITHOTRIPSY Right 01/07/2015   Procedure: EXTRACORPOREAL SHOCK WAVE LITHOTRIPSY (ESWL);  Surgeon: Lorraine Lax, MD;  Location: [facility] ORS;  Service: Urology;  Laterality: Right;  . EXTRACORPOREAL SHOCK WAVE LITHOTRIPSY Left 01/21/2015   Procedure: EXTRACORPOREAL SHOCK WAVE LITHOTRIPSY (ESWL);  Surgeon: Hildred Laser, MD;  Location: [facility] ORS;  Service: Urology;  Laterality: Left;  . EYE SURGERY Bilateral    Cataract Extraction with IOL  . HERNIA REPAIR Bilateral    inguinal  . URETEROSCOPY WITH HOLMIUM LASER LITHOTRIPSY Bilateral 02/24/2015   Procedure: URETEROSCOPY WITH HOLMIUM LASER LITHOTRIPSY /STONE BASKETING;  Surgeon: Hildred Laser, MD;  Location: [facility] OR[facility];  Service: Urology;  Laterality: Bilateral;  . URETEROSCOPY WITH HOLMIUM LASER LITHOTRIPSY Bilateral 04/11/2016   Procedure: URETEROSCOPY WITH HOLMIUM LASER LITHOTRIPSY;  Surgeon: Vanna Scotland, MD;  Location: [facility] OR[facility];  Service: Urology;  Laterality: Bilateral;    Prior to Admission medications   Medication Sig Start Date End Date Taking? Authorizing Provider  aspirin EC 81 MG tablet Take 81 mg by mouth daily.   Yes [provider]  diphenoxylate-atropine (LOMOTIL) 2.5-0.025 MG tablet Take 1 tablet by mouth 4 (four) times daily as needed for diarrhea or loose stools.   Yes [provider]  hydrochlorothiazide (HYDRODIURIL) 25 MG tablet Take 12.5 mg by mouth daily.  04/12/16  Yes [provider]  levETIRAcetam (KEPPRA) 500 MG tablet Take one tablet nightly 02/21/17  Yes [provider]  metoprolol succinate (TOPROL-XL) 25 MG 24 hr tablet Take 25 mg by mouth daily.   Yes [provider]   pantoprazole (PROTONIX) 40 MG tablet Take 40 mg by mouth daily.   Yes [provider]  QUEtiapine (SEROQUEL) 25 MG tablet Take 25 mg by mouth at bedtime.   Yes [provider]  rivastigmine (EXELON) 1.5 MG capsule Take 3 mg by mouth 2 (two) times daily.    Yes [provider]  sertraline (ZOLOFT) 50 MG tablet Take 1 tablet by mouth daily. 04/30/17  Yes [provider]  simvastatin (ZOCOR) 20 MG tablet Take 20 mg by mouth daily.   Yes [provider]  traMADol (ULTRAM) 50 MG tablet Take 50 mg by mouth every 6 (six) hours as needed.   Yes [provider]    Allergies Codeine; Penicillin g; and Sulfa antibiotics  Family History  Problem Relation Age of Onset  . Heart attack Mother   . Heart attack Father   . Hypertension Unknown   . Kidney disease Neg Hx   . Prostate cancer Neg Hx     Social History Social History   Tobacco Use  . Smoking status: Never Smoker  . Smokeless tobacco: Never Used  Substance Use Topics  . Alcohol use: No    Alcohol/week: 0.0 oz  . Drug use: No    Review of Systems per family  Constitutional: No fever/chills Eyes: No visual changes. ENT: No sore throat. No stiff neck no neck pain Cardiovascular: Denies chest pain. Respiratory:  Denies shortness of breath. Gastrointestinal:   no vomiting.  No diarrhea.  No constipation. Genitourinary: Negative for dysuria. Musculoskeletal: Negative lower extremity swelling Skin: Negative for rash. Neurological: Negative for severe headaches, focal weakness or numbness.   ____________________________________________   PHYSICAL EXAM:  VITAL SIGNS: ED Triage Vitals  Enc Vitals Group     BP 05/08/17 1050 124/87     Pulse Rate 05/08/17 1050 81     Resp 05/08/17 1050 20     Temp 05/08/17 1050 98.4 F (36.9 C)     Temp Source 05/08/17 1050 Oral     SpO2 05/08/17 1050 98 %     Weight 05/08/17 1051 155 lb (70.3 kg)     Height --      Head Circumference  --      Peak Flow --      Pain Score --      Pain Loc --      Pain Edu? --      Excl. in GC? --     Constitutional: Patient is confused, in no acute distress Eyes: Conjunctivae are normal Head: Atraumatic HEENT: No congestion/rhinnorhea. Mucous membranes are very dry.  Oropharynx non-erythematous Neck:   Nontender with no meningismus, no masses, no stridor Cardiovascular: Tachycardia, irregularly irregular. Grossly normal heart sounds.  Good peripheral circulation. Respiratory: Normal respiratory effort.  No retractions. Lungs CTAB. Abdominal: Soft and nontender. No distention. No guarding no rebound Back:  There is no focal tenderness or step off.  there is no midline tenderness there are no lesions noted. there is no CVA tenderness Musculoskeletal: No lower extremity tenderness, no upper extremity tenderness. No joint effusions, no DVT signs strong distal pulses no edema Neurologic:  Normal speech and language. No gross focal neurologic deficits are appreciated.  Skin:  Skin is warm, dry and intact. No rash noted. Psychiatric: Mood and affect are normal. Speech and behavior are normal.  ____________________________________________   LABS (all labs ordered are listed, but only abnormal results are displayed)  Labs Reviewed  URINALYSIS, COMPLETE (UACMP) WITH MICROSCOPIC - Abnormal; Notable for the following components:      Result Value   Color, Urine YELLOW (*)    APPearance CLEAR (*)    Hgb urine dipstick SMALL (*)    Protein, ur 30 (*)    All other components within normal limits  COMPREHENSIVE METABOLIC PANEL - Abnormal; Notable for the following components:   Potassium 3.4 (*)    Glucose, Bld 175 (*)    BUN 24 (*)    AST 46 (*)    ALT 14 (*)    Total Bilirubin 1.5 (*)    All other components within normal limits  CBC WITH DIFFERENTIAL/PLATELET - Abnormal; Notable for the following components:   WBC 16.6 (*)    RDW 15.2 (*)    Neutro Abs 14.4 (*)    Lymphs Abs 0.7  (*)    Monocytes Absolute 1.4 (*)    All other components within normal limits  URINE CULTURE  TROPONIN I    Pertinent labs  results that were available during my care of the patient were reviewed by me and considered in my medical decision making (see chart for details). ____________________________________________  EKG  I personally interpreted any EKGs ordered by me or triage Atrial fibrillation weight 135 no acute ST elevation or depression no specific ST changes, slight artifact limits interpretation ____________________________________________  RADIOLOGY  Pertinent labs & imaging results that were available during my care of the patient  were reviewed by me and considered in my medical decision making (see chart for details). If possible, patient and/or family made aware of any abnormal findings.  Dg Chest 1 View  Result Date: 05/08/2017 CLINICAL DATA:  84 year old male with confusion.  Initial encounter. EXAM: CHEST  1 VIEW COMPARISON:  04/12/2016 chest x-ray. FINDINGS: Cardiomegaly. Stable central pulmonary vascular prominence. Stable chronic lung changes. No infiltrate, congestive heart failure or pneumothorax. No plain film evidence of pulmonary malignancy. Calcified mildly tortuous aorta. No acute osseous abnormality. IMPRESSION: Cardiomegaly. Stable central pulmonary vascular prominence. Stable chronic lung changes. Aortic Atherosclerosis (ICD10-I70.0). Electronically Signed   By: Lacy Duverney M.D.   On: 05/08/2017 11:31   Ct Head Wo Contrast  Result Date: 05/08/2017 CLINICAL DATA:  Increasing confusion EXAM: CT HEAD WITHOUT CONTRAST TECHNIQUE: Contiguous axial images were obtained from the base of the skull through the vertex without intravenous contrast. COMPARISON:  04/06/2017 FINDINGS: Brain: Diffuse atrophic changes are noted. Changes of prior ischemia in the left occipital lobe with encephalomalacia are again noted. No findings to suggest acute hemorrhage, acute infarction  or space-occupying mass lesion are seen. Vascular: No hyperdense vessel or unexpected calcification. Skull: Normal. Negative for fracture or focal lesion. Sinuses/Orbits: Mild mucosal changes are noted in the right maxillary antrum stable from the prior exam. Other: None IMPRESSION: Chronic changes without acute abnormality. Electronically Signed   By: Alcide Clever M.D.   On: 05/08/2017 11:39   ____________________________________________    PROCEDURES  Procedure(s) performed: None  Procedures  Critical Care performed: None  ____________________________________________   INITIAL IMPRESSION / ASSESSMENT AND PLAN / ED COURSE  Pertinent labs & imaging results that were available during my care of the patient were reviewed by me and considered in my medical decision making (see chart for details).  Patient here he is oriented to name and can tell me his brother's name which is better than he has been doing however he is very dehydrated clinically and seems to be significantly off his baseline according to family.  I did receive a call from neurology who feel he should be admitted for an MRI. T scan is negative for acute bleed or infarct, a very limited neurologic exam based on patient's ability to comply but there is no obvious focality noted.  The patient has baseline dementia but apparently this is a acute worsening.  No evidence of urinary tract infection, pneumonia, head bleed, hypoglycemia, white count is elevated but his baseline has been elevated before.  Cardiac enzymes are negative fortunately.  Patient remains somewhat confused we have given him IV fluid, creatinine is reassuring however.  I will discuss this with hospital.  Who notes the patient's initial heart rate was in the 130s, he does have a history of atrial fibrillation, at this time however his heart rate is gone up to the 140s, there is no evidence of sepsis, not certain this would be a very reactive tachycardia.  It does  appear to be A. fib with RVR at this time.  He is afebrile, blood work is reassuring etc.  We will continue to hydrate the patient as I do think dehydration is playing a role in this, and we will consider giving him a small dose of Cardizem to control his rate.  This I think will be sufficient to ensure that we can admit him to further evaluate his change in mental status.  I do not think an LP is indicated.  No skin infection noted  Signed out at the  end of my shift admmission pending.    ____________________________________________   FINAL CLINICAL IMPRESSION(S) / ED DIAGNOSES  Final diagnoses:  Delirium      This chart was dictated using voice recognition software.  Despite best efforts to proofread,  errors can occur which can change meaning.      Jeanmarie PlantMcShane, Piotr A, MD 05/08/17 1532    Jeanmarie PlantMcShane, Dimetri A, MD 05/08/17 1536    Jeanmarie PlantMcShane, Mainor A, MD 05/08/17 [phone]

## 2017-05-09 ENCOUNTER — Inpatient Hospital Stay
Admit: 2017-05-09 | Discharge: 2017-05-09 | Disposition: A | Discharge status: Home / Self Care | Payer: Medicare Other | Attending: Family Medicine | Admitting: Family Medicine

## 2017-05-09 DIAGNOSIS — R410 Disorientation, unspecified: Secondary | ICD-10-CM

## 2017-05-09 DIAGNOSIS — I4891 Unspecified atrial fibrillation: Secondary | ICD-10-CM

## 2017-05-09 LAB — URINE CULTURE: Culture: NO GROWTH

## 2017-05-09 LAB — CBC
HEMATOCRIT: 41 % (ref 40.0–52.0)
Hemoglobin: 13.5 g/dL (ref 13.0–18.0)
MCH: 30.5 pg (ref 26.0–34.0)
MCHC: 33 g/dL (ref 32.0–36.0)
MCV: 92.5 fL (ref 80.0–100.0)
PLATELETS: 325 10*3/uL (ref 150–440)
RBC: 4.43 MIL/uL (ref 4.40–5.90)
RDW: 15.1 % — AB (ref 11.5–14.5)
WBC: 13.8 10*3/uL — ABNORMAL HIGH (ref 3.8–10.6)

## 2017-05-09 LAB — BASIC METABOLIC PANEL
Anion gap: 8 (ref 5–15)
BUN: 22 mg/dL — ABNORMAL HIGH (ref 6–20)
CALCIUM: 9 mg/dL (ref 8.9–10.3)
CO2: 29 mmol/L (ref 22–32)
CREATININE: 1.03 mg/dL (ref 0.61–1.24)
Chloride: 108 mmol/L (ref 101–111)
GFR calc Af Amer: >60 mL/min (ref >60)
GFR calc non Af Amer: >60 mL/min (ref >60)
GLUCOSE: 116 mg/dL — AB (ref 65–99)
Potassium: 3.7 mmol/L (ref 3.5–5.1)
Sodium: 145 mmol/L (ref 135–145)

## 2017-05-09 LAB — TROPONIN I: Troponin I: <0.03 ng/mL (ref <0.03)

## 2017-05-09 LAB — ECHOCARDIOGRAM COMPLETE: Weight: 2395.08 oz

## 2017-05-09 MED ORDER — AMIODARONE HCL IN DEXTROSE 360-4.14 MG/200ML-% IV SOLN
60.0000 mg/h | INTRAVENOUS | Status: DC
Start: 2017-05-09 — End: 2017-05-10
  Administered 2017-05-09: 60 mg/h via INTRAVENOUS
  Filled 2017-05-09: qty 200

## 2017-05-09 MED ORDER — AMIODARONE HCL IN DEXTROSE 360-4.14 MG/200ML-% IV SOLN
30.0000 mg/h | INTRAVENOUS | Status: DC
  Administered 2017-05-10 (×2): 30 mg/h via INTRAVENOUS
  Filled 2017-05-09: qty 200

## 2017-05-09 MED ORDER — ADULT MULTIVITAMIN W/MINERALS CH
1.0000 | ORAL_TABLET | Freq: Every day | ORAL | Status: DC
  Administered 2017-05-10 – 2017-05-15 (×6): 1 via ORAL
  Filled 2017-05-09 (×7): qty 1

## 2017-05-09 MED ORDER — METOPROLOL SUCCINATE ER 50 MG PO TB24
50.0000 mg | ORAL_TABLET | Freq: Every day | ORAL | Status: DC
  Administered 2017-05-10 – 2017-05-15 (×6): 50 mg via ORAL
  Filled 2017-05-09 (×6): qty 1

## 2017-05-09 MED ORDER — POTASSIUM CHLORIDE CRYS ER 20 MEQ PO TBCR
40.0000 meq | EXTENDED_RELEASE_TABLET | Freq: Once | ORAL | Status: AC
  Administered 2017-05-09: 40 meq via ORAL
  Filled 2017-05-09: qty 2

## 2017-05-09 MED ORDER — TEMAZEPAM 15 MG PO CAPS: 15.0000 mg | ORAL_CAPSULE | Freq: Every day | ORAL | Status: DC

## 2017-05-09 MED ORDER — LORAZEPAM 2 MG/ML IJ SOLN
0.5000 mg | Freq: Once | INTRAMUSCULAR | Status: AC | PRN
  Administered 2017-05-09: 0.5 mg via INTRAVENOUS
  Filled 2017-05-09: qty 1

## 2017-05-09 MED ORDER — CEPHALEXIN 500 MG PO CAPS
500.0000 mg | ORAL_CAPSULE | Freq: Two times a day (BID) | ORAL | Status: DC
  Administered 2017-05-09 – 2017-05-11 (×5): 500 mg via ORAL
  Filled 2017-05-09 (×5): qty 1

## 2017-05-09 MED ORDER — DIPHENHYDRAMINE HCL 50 MG/ML IJ SOLN
25.0000 mg | Freq: Once | INTRAMUSCULAR | Status: AC
  Administered 2017-05-09: 25 mg via INTRAVENOUS
  Filled 2017-05-09: qty 1

## 2017-05-09 MED ORDER — ENSURE ENLIVE PO LIQD
237.0000 mL | Freq: Two times a day (BID) | ORAL | Status: DC
  Administered 2017-05-10 – 2017-05-15 (×9): 237 mL via ORAL

## 2017-05-09 NOTE — Consult Note (Addendum)
Consultation Note Date: 05/09/2017   Patient Name: William Lozano.  DOB: [birth year]  MRN: [id number]  Age / Sex: 84 y.o., male  PCP: Marguarite Arbour, MD Referring Physician: Bertrum Sol, MD  Reason for Consultation: Establishing goals of care  HPI/Patient Profile:  William Lozano  is a 84 y.o. male with a known history of atrial fibrillation, dementia, Recurrent kidney stones and a previous history of stroke who is brought by his family due to progressive confusion and agitation.     Clinical Assessment and Goals of Care: Patient resting in bed. Nonverbal. Initially no family at bedside. Spoke with son William Lozano who states he is out of town in [place] for a week, and his brother William Lozano. (William Lozano) is out of town in [place]. Discussed his father's diagnosis and he states he is not the POA, William Lozano is. He did tell me of his father's decline. Spoke with William Lozano via phone and he has questions and would like to speak with primary team before making any decisions. Girlfriend Polly and patient's brother at bedside, they state William Lozano is the POA.   Per family conversations, William Lozano developed dementia about 2 years ago. He was painting a house on a ladder 2 years ago, though family states he probably shouldn't have been. A year ago he had kidney stones and had 2 OR procedures and the family noticed a cognitive decline. 3/18 and 5/18, he had a stroke. A few months later, the family noticed a shuffling gait. In July he and [name] stopped taking Holiday Tour trips because she states he was not able to follow directions. [place] this year he fell, and he had a significant decline cognitively. He has been using a walker for about a month and a wheelchair for long distances. His family states he has not been eating or drinking well, and thought he was drinking enough when he was not. His family states they had to force him to drink  water.   Discussed long term prognosis with family. Dr. Katheren Shams to speak with son William Lozano [phone] to answer questions. Will continue current level of care. Will speak with family further.         SUMMARY OF RECOMMENDATIONS     Primary team to speak with POA son William Lozano. Will continue to follow.   Son William Lozano. POA phone # [phone]    Code Status/Advance Care Planning:  DNR    Symptom Management:   Per primary team  Palliative Prophylaxis:   Eye Care and Oral Care  Prognosis:   Poor. Concern for dehydration due to lack of PO intake. Dementia.   Discharge Planning: To Be Determined      Primary Diagnoses: Present on Admission: . A-fib [facility])   I have reviewed the medical record, interviewed the patient and family, and examined the patient. The following aspects are pertinent.  Past Medical History:  Diagnosis Date  . Arthritis   . Atrial fibrillation (HCC) 2016   paroxysmal atrial fibrillation, sick sinus  syndrome  . Complication of anesthesia    affects memory for a longer time after  . Dementia    alzheimers  . Essential hypertension   . GERD (gastroesophageal reflux disease)   . History of kidney stones   . Hyperlipemia   . Inguinal hernia recurrent bilateral   . Kidney stones   . Stroke [facility])    Social History   Socioeconomic History  . Marital status: Widowed    Spouse name: None  . Number of children: None  . Years of education: None  . Highest education level: None  Social Needs  . Financial resource strain: None  . Food insecurity - worry: None  . Food insecurity - inability: None  . Transportation needs - medical: None  . Transportation needs - non-medical: None  Occupational History  . None  Tobacco Use  . Smoking status: Never Smoker  . Smokeless tobacco: Never Used  Substance and Sexual Activity  . Alcohol use: No    Alcohol/week: 0.0 oz  . Drug use: No  . Sexual activity: None  Other Topics Concern  . None  Social  History Narrative  . None   Family History  Problem Relation Age of Onset  . Heart attack Mother   . Heart attack Father   . Hypertension Unknown   . Kidney disease Neg Hx   . Prostate cancer Neg Hx    Scheduled Meds: . aspirin EC  81 mg Oral Daily  . cephALEXin  500 mg Oral Q12H  . enoxaparin (LOVENOX) injection  40 mg Subcutaneous Q24H  . levETIRAcetam  500 mg Oral BID  . [START ON 05/10/2017] metoprolol succinate  50 mg Oral Daily  . pantoprazole  40 mg Oral QAC breakfast  . simvastatin  20 mg Oral Daily  . temazepam  15 mg Oral QHS   Continuous Infusions: . sodium chloride 100 mL/hr at 05/09/17 1053   PRN Meds:.acetaminophen **OR** acetaminophen, haloperidol lactate, ondansetron **OR** ondansetron (ZOFRAN) IV Medications Prior to Admission:  Prior to Admission medications   Medication Sig Start Date End Date Taking? Authorizing Provider  aspirin EC 81 MG tablet Take 81 mg by mouth daily.   Yes [provider]  diphenoxylate-atropine (LOMOTIL) 2.5-0.025 MG tablet Take 1 tablet by mouth 4 (four) times daily as needed for diarrhea or loose stools.   Yes [provider]  hydrochlorothiazide (HYDRODIURIL) 25 MG tablet Take 12.5 mg by mouth daily.  04/12/16  Yes [provider]  levETIRAcetam (KEPPRA) 500 MG tablet Take one tablet nightly 02/21/17  Yes [provider]  metoprolol succinate (TOPROL-XL) 25 MG 24 hr tablet Take 25 mg by mouth daily.   Yes [provider]  pantoprazole (PROTONIX) 40 MG tablet Take 40 mg by mouth daily.   Yes [provider]  QUEtiapine (SEROQUEL) 25 MG tablet Take 25 mg by mouth at bedtime.   Yes [provider]  rivastigmine (EXELON) 1.5 MG capsule Take 3 mg by mouth 2 (two) times daily.    Yes [provider]  sertraline (ZOLOFT) 50 MG tablet Take 1 tablet by mouth daily. 04/30/17  Yes [provider]  simvastatin (ZOCOR) 20 MG tablet Take 20 mg by mouth daily.   Yes  [provider]  traMADol (ULTRAM) 50 MG tablet Take 50 mg by mouth every 6 (six) hours as needed.   Yes [provider]   Allergies  Allergen Reactions  . Codeine Other (See Comments)    Causes side effects  .  Penicillin G Rash    Has patient had a PCN reaction causing immediate rash, facial/tongue/throat swelling, SOB or lightheadedness with hypotension: Yes Has patient had a PCN reaction causing severe rash involving mucus membranes or skin necrosis: No Has patient had a PCN reaction that required hospitalization: No Has patient had a PCN reaction occurring within the last 10 years: No If all of the above answers are "NO", then may proceed with Cephalosporin use.  . Sulfa Antibiotics Rash    Pt and fam are saying he does not have allergy to sulfa now.   Review of Systems  Unable to perform ROS   Physical Exam  Constitutional: No distress.  Pulmonary/Chest: Effort normal.  Neurological: He is alert.  Nonverbal    Vital Signs: BP 120/78 (BP Location: Left Arm)   Pulse 90   Temp 97.8 F (36.6 C)   Resp 14   Wt 67.9 kg (149 lb 11.1 oz)   SpO2 95%   BMI 20.30 kg/m  Pain Assessment: 0-10   Pain Score: Asleep   SpO2: SpO2: 95 % O2 Device:SpO2: 95 % O2 Flow Rate: .   IO: Intake/output summary:   Intake/Output Summary (Last 24 hours) at 05/09/2017 1525 Last data filed at 05/09/2017 1039 Gross per 24 hour  Intake 500 ml  Output 100 ml  Net 400 ml    LBM: Last BM Date: 05/09/17 Baseline Weight: Weight: 70.3 kg (155 lb) Most recent weight: Weight: 67.9 kg (149 lb 11.1 oz)     Palliative Assessment/Data: 20%     Time In: 2:15 Time Out: 3:45 Time Total: 90 min Greater than 50%  of this time was spent counseling and coordinating care related to the above assessment and plan.  Signed by: Morton Stallrystal Meng Winterton, NP   Please contact Palliative Medicine Team phone at [phone] for questions and concerns.  For individual provider: See  Loretha StaplerAmion

## 2017-05-09 NOTE — Progress Notes (Signed)
*  PRELIMINARY RESULTS* Echocardiogram 2D Echocardiogram has been performed.  William GulaJoan M Ugonna Lozano 05/09/2017, 11:35 AM

## 2017-05-09 NOTE — Progress Notes (Signed)
[employer] [facility] - [place] at [facility]   PATIENT NAME: William Lozano    MR#:  [id number]  DATE OF BIRTH:  Mar 21, 1932  SUBJECTIVE:  CHIEF COMPLAINT:   Chief Complaint  Patient presents with  . Weakness  Patient remains encephalopathic, the patient's brother is at the bedside, patient is a DNR status per brother, per nursing staff-patient is pulling out his IVs/restless in bed/not following commands/agitated  REVIEW OF SYSTEMS:  CONSTITUTIONAL: No fever, fatigue or weakness.  EYES: No blurred or double vision.  EARS, NOSE, AND THROAT: No tinnitus or ear pain.  RESPIRATORY: No cough, shortness of breath, wheezing or hemoptysis.  CARDIOVASCULAR: No chest pain, orthopnea, edema.  GASTROINTESTINAL: No nausea, vomiting, diarrhea or abdominal pain.  GENITOURINARY: No dysuria, hematuria.  ENDOCRINE: No polyuria, nocturia,  HEMATOLOGY: No anemia, easy bruising or bleeding SKIN: No rash or lesion. MUSCULOSKELETAL: No joint pain or arthritis.   NEUROLOGIC: No tingling, numbness, weakness.  PSYCHIATRY: No anxiety or depression.   ROS  DRUG ALLERGIES:   Allergies  Allergen Reactions  . Codeine Other (See Comments)    Causes side effects  . Penicillin G Rash    Has patient had a PCN reaction causing immediate rash, facial/tongue/throat swelling, SOB or lightheadedness with hypotension: Yes Has patient had a PCN reaction causing severe rash involving mucus membranes or skin necrosis: No Has patient had a PCN reaction that required hospitalization: No Has patient had a PCN reaction occurring within the last 10 years: No If all of the above answers are "NO", then may proceed with Cephalosporin use.  . Sulfa Antibiotics Rash    Pt and fam are saying he does not have allergy to sulfa now.    VITALS:  Blood pressure 120/78, pulse 90, temperature 97.8 F (36.6 C), resp. rate 14, weight 67.9 kg (149 lb 11.1 oz), SpO2 95 %.  PHYSICAL EXAMINATION:  GENERAL:  84 y.o.-year-old  patient lying in the bed with no acute distress.  EYES: Pupils equal, round, reactive to light and accommodation. No scleral icterus. Extraocular muscles intact.  HEENT: Head atraumatic, normocephalic. Oropharynx and nasopharynx clear.  NECK:  Supple, no jugular venous distention. No thyroid enlargement, no tenderness.  LUNGS: Normal breath sounds bilaterally, no wheezing, rales,rhonchi or crepitation. No use of accessory muscles of respiration.  CARDIOVASCULAR: S1, S2 normal. No murmurs, rubs, or gallops.  ABDOMEN: Soft, nontender, nondistended. Bowel sounds present. No organomegaly or mass.  EXTREMITIES: No pedal edema, cyanosis, or clubbing.  NEUROLOGIC: Cranial nerves II through XII are intact. Muscle strength 5/5 in all extremities. Sensation intact. Gait not checked.  PSYCHIATRIC: The patient is alert and oriented x 3.  SKIN: No obvious rash, lesion, or ulcer.   Physical Exam LABORATORY PANEL:   CBC Recent Labs  Lab 05/09/17 0501  WBC 13.8*  HGB 13.5  HCT 41.0  PLT 325   ------------------------------------------------------------------------------------------------------------------  Chemistries  Recent Labs  Lab 05/08/17 1211 05/08/17 1913 05/09/17 0501  NA 143  --  145  K 3.4*  --  3.7  CL 104  --  108  CO2 27  --  29  GLUCOSE 175*  --  116*  BUN 24*  --  22*  CREATININE 1.05  --  1.03  CALCIUM 9.3  --  9.0  MG  --  1.9  --   AST 46*  --   --   ALT 14*  --   --   ALKPHOS 79  --   --   BILITOT 1.5*  --   --    ------------------------------------------------------------------------------------------------------------------  Cardiac Enzymes Recent Labs  Lab 05/08/17 1211 05/09/17 0924  TROPONINI <0.03 <0.03   ------------------------------------------------------------------------------------------------------------------  RADIOLOGY:  Dg Chest 1 View  Result Date: 05/08/2017 CLINICAL DATA:  84 year old male with confusion.  Initial encounter. EXAM:  CHEST  1 VIEW COMPARISON:  04/12/2016 chest x-ray. FINDINGS: Cardiomegaly. Stable central pulmonary vascular prominence. Stable chronic lung changes. No infiltrate, congestive heart failure or pneumothorax. No plain film evidence of pulmonary malignancy. Calcified mildly tortuous aorta. No acute osseous abnormality. IMPRESSION: Cardiomegaly. Stable central pulmonary vascular prominence. Stable chronic lung changes. Aortic Atherosclerosis (ICD10-I70.0). Electronically Signed   By: Lacy DuverneySteven  Olson M.D.   On: 05/08/2017 11:31   Ct Head Wo Contrast  Result Date: 05/08/2017 CLINICAL DATA:  Increasing confusion EXAM: CT HEAD WITHOUT CONTRAST TECHNIQUE: Contiguous axial images were obtained from the base of the skull through the vertex without intravenous contrast. COMPARISON:  04/06/2017 FINDINGS: Brain: Diffuse atrophic changes are noted. Changes of prior ischemia in the left occipital lobe with encephalomalacia are again noted. No findings to suggest acute hemorrhage, acute infarction or space-occupying mass lesion are seen. Vascular: No hyperdense vessel or unexpected calcification. Skull: Normal. Negative for fracture or focal lesion. Sinuses/Orbits: Mild mucosal changes are noted in the right maxillary antrum stable from the prior exam. Other: None IMPRESSION: Chronic changes without acute abnormality. Electronically Signed   By: Alcide CleverMark  Lukens M.D.   On: 05/08/2017 11:39   Dg Foot 2 Views Right  Result Date: 05/08/2017 CLINICAL DATA:  Right foot pain after fall last month. Difficulty ambulating. EXAM: RIGHT FOOT - 2 VIEW COMPARISON:  None. FINDINGS: There is no evidence of fracture or dislocation. Moderate-to-marked joint space narrowing with bone-on-bone apposition of the first MTP articulation. Osteophytes are noted dorsally across this joint with mild periarticular calcifications possibly representing ligamentous calcifications. No suspicious osseous lesions. Tiny enthesophyte off the dorsum of the calcaneus.  Mild joint space narrowing of the tibiotalar, subtalar and midfoot articulations. There is slight overlap of the fourth and fifth toes with splayed appearance of the webspace between the third and fourth toes. No definite soft tissue mass is seen accounting for this appearance. Soft tissues are unremarkable. IMPRESSION: Mild degenerative joint space narrowing of the tibiotalar, subtalar and midfoot articulations. Moderate-to-marked joint space narrowing with osteophyte formation and bone-on-bone apposition of the first MTP is identified. No acute fracture joint dislocations. Electronically Signed   By: Tollie Ethavid  Kwon M.D.   On: 05/08/2017 18:33    ASSESSMENT AND PLAN:  Patient is a 84 year old white male presenting to the ER with worsening confusion  1.  A. fib with RVR Improved Continue PRN IV Lopressor and digoxin, continue aspirin as patient is not a candidate for full dose anticoagulation due to fall risk, follow-up on cardiology recommendations, echocardiogram, cardiac enzymes inconsistent with acute coronary syndrome, and continue close medical monitoring  2.  Acute encephalopathy with underlying dementia Suspect related to dehydration recent fall and progressive decline No evidence of infection noted Hold psychotropic meds, neurochecks per routine, aspiration/fall/skin care precautions while in house Hospital course complicated due to patient's uncooperativeness/agitation, pulling out IVs  3.  Leukocytosis, acute Most likely secondary to stress reaction  urinalysis and chest x-ray are negative  4.  Essential hypertension Stable on current regiment Hydrochlorothiazide should be discontinued indefinitely given patient's poor p.o. intake/advanced age   545.  Dementia, chronic Plan of care as stated above  6.  Hyperlipidemia, unspecified continue Zocor  DNR status Condition stable  Prognosis fair  DVT prophylaxis with Lovenox  Disposition pending  clinical course    All the  records are reviewed and case discussed with Care Management/Social Workerr. Management plans discussed with the patient, family and they are in agreement.  CODE STATUS: dnr  TOTAL TIME TAKING CARE OF THIS PATIENT: 35 minutes.     POSSIBLE D/C IN 2-3 DAYS, DEPENDING ON CLINICAL CONDITION.   Evelena Asa Jermesha Sottile M.D on 05/09/2017   Between 7am to 6pm - Pager - [phone]  After 6pm go to [email or web address] - password [email or web address]  [facility]  Office  [phone]  CC: Primary care physician; Marguarite Arbour, MD  Note: This dictation was prepared with Dragon dictation along with smaller phrase technology. Any transcriptional errors that result from this process are unintentional.

## 2017-05-09 NOTE — Progress Notes (Signed)
Pt continues to be agitated. MD Caryn BeeMaier made aware. 5 mg Zyprexa IM given. Pt more calm but becomes fidgity and "ready to go out to the truck and get home" whenever this nurse goes in the room. Per MD, 400 mg amiodarone PO given until IV acces can be reestablished.

## 2017-05-09 NOTE — Progress Notes (Addendum)
Initial Nutrition Assessment  DOCUMENTATION CODES:   Not applicable  INTERVENTION:  Ensure BID providing 350 kcal and 20 grams protein per supplement Magic cup w/ lunch and dinner MVI - daily Liberalize diet - regular   NUTRITION DIAGNOSIS:   Inadequate oral intake related to acute illness as evidenced by per patient/family report.  Suspect malnutrition due to PO intake and dementia progression, however, unable to complete physical exam at this time.   GOAL:   Patient will meet greater than or equal to 90% of their needs   MONITOR:   PO intake, Supplement acceptance, Labs, Weight trends   Recommended labs to monitor: magnesium, potassium, phosphorus  REASON FOR ASSESSMENT:   Malnutrition Screening Tool    ASSESSMENT:   [name] w/ Alzheimers presents w/ strong smelling dark urine, confusion, suspected dehydration, and A-fib and suspected gout to RLE.   Pt was speaking, but spoke to brother regarding nutrition history. Brother reports that he picks like a bird at food, lost approximately 5-6 lbs in the past month, and doesn't drink fluids even after being reminded. Suspect that the weight loss is due to dehydration as he also has dark, foul smelling urine, kidney stones, and high BUN. His baseline dementia is worse now and he cannot even walk.    Spoke to brother regarding suspected lactose intolerance due to possible connection between chronic diarrhea and consumption of a gallon of milk daily - brother reports his brother is not lactose intolerant to his knowledge.   Palliative care following pt.   Pt is at moderate risk for refeeding syndrome, therefore, labs should be monitored - magnesium, potassium, phosphorus.   Medications: Sodium chloride IV fluids - 100 mL/hr, haldol, keflex, protonix  Labs: BUN 22 (H), BG 116 (H), Uric acid 7.7 (H), mg 1.9 (WNL), potassium 3.7 (WNL), WBC 13.8 (H)  NUTRITION - FOCUSED PHYSICAL EXAM:  Unable to perform physical  examination due to pt sleeping - did not wake b/c he was irritated and up all night due his brother leaving.    Diet Order:  Diet Heart Room service appropriate? Yes; Fluid consistency: Thin Fall precautions Aspiration precautions  EDUCATION NEEDS:   Not appropriate for education at this time  Skin:  Skin Assessment: Reviewed RN Assessment  Last BM:  05/09/17  Height:   Ht Readings from Last 1 Encounters:  05/09/17 6' (1.829 m)    Weight:   Wt Readings from Last 1 Encounters:  05/08/17 149 lb 11.1 oz (67.9 kg)   UBW: 155 lbs %UBW: 96%  Ideal Body Weight:  80.9 kg  BMI:  Body mass index is 20.3 kg/m.  Estimated Nutritional Needs:   Kcal:  1700-2000 kcal  Protein:  81-95 grams protein  Fluid:  >2 L    William Lozano, Dietetic Intern

## 2017-05-10 ENCOUNTER — Ambulatory Visit: Admit: 2017-05-10 | Payer: Medicare Other

## 2017-05-10 MED ORDER — TEMAZEPAM 15 MG PO CAPS
15.0000 mg | ORAL_CAPSULE | Freq: Every evening | ORAL | Status: DC | PRN
  Administered 2017-05-11 – 2017-05-16 (×5): 15 mg via ORAL
  Filled 2017-05-10 (×5): qty 1

## 2017-05-10 MED ORDER — DILTIAZEM HCL 100 MG IV SOLR
5.0000 mg/h | INTRAVENOUS | Status: DC
  Administered 2017-05-10: 5 mg/h via INTRAVENOUS
  Administered 2017-05-11: 10 mg/h via INTRAVENOUS
  Administered 2017-05-11: 7.5 mg/h via INTRAVENOUS
  Filled 2017-05-10 (×3): qty 100

## 2017-05-10 MED ORDER — LEVETIRACETAM 500 MG PO TABS
500.0000 mg | ORAL_TABLET | Freq: Every day | ORAL | Status: DC
  Administered 2017-05-10 – 2017-05-16 (×7): 500 mg via ORAL
  Filled 2017-05-10 (×7): qty 1

## 2017-05-10 MED ORDER — HALOPERIDOL LACTATE 5 MG/ML IJ SOLN
2.0000 mg | Freq: Once | INTRAMUSCULAR | Status: AC
  Administered 2017-05-10: 2 mg via INTRAVENOUS
  Filled 2017-05-10: qty 1

## 2017-05-10 NOTE — Progress Notes (Signed)
Pt very restless, and agitated, trying to climb out of bed and pulling at his iv. Hr continues to be elevated in the 150"s. Orders placed. Will continue to monitor  and assess

## 2017-05-10 NOTE — Progress Notes (Signed)
MD notified of Pt agitated, rest less, trying to get out of bed. Orders placed. Will continue to monitor and assess.

## 2017-05-10 NOTE — Progress Notes (Signed)
PT Cancellation Note  Patient Details Name: William B Kosier Sr. MRN: [name] DOB: 12/16/1932   Cancelled Treatment:    Reason Eval/Treat Not Completed: Other (comment). Consult received and chart reviewed. Per chart, pt very agitated over night. HR elevated and started on amiodarone IV around 0315. Will hold at this time and re-attempt in PM session if HR continues to stay under control.   Aylana Hirschfeld 05/10/2017, 8:42 AM Elizabeth PalauStephanie Rosie Torrez, PT, DPT [phone]

## 2017-05-10 NOTE — Progress Notes (Signed)
PT Cancellation Note  Patient Details Name: Sheilah PigeonJames B Fayson Sr. MRN: [id number] DOB: 01/04/1933   Cancelled Treatment:    Reason Eval/Treat Not Completed: Other (comment). Pt continues to have resting HR range from 94-118bpm while standing at tele monitor. Pt still on IV amiodarone. Will hold at this time.   Aryeh Butterfield 05/10/2017, 3:59 PM  Elizabeth PalauStephanie Caroll Weinheimer, PT, DPT [phone]

## 2017-05-10 NOTE — Progress Notes (Addendum)
Daily Progress Note   Patient Name: William PigeonJames B Bayliss Sr.       Date: 05/10/2017 DOB: 01/09/1933  Age: 84 y.o. MRN#: [id number] Attending Physician: Bertrum SolSalary, Montell D, MD Primary Care Physician: Marguarite ArbourSparks, Jeffrey D, MD Admit Date: 05/08/2017  Reason for Consultation/Follow-up: Establishing goals of care  Subjective: Patient resting in bed. Nonverbal, opens eyes intermittently and looks around and closes them again. Sister at bedside, later brother and girlfriend Polly at bedside. They state he has not been able to speak with them for about a month. They feel that patient has had a marked decline since his fall. He has been incontinent of bowel and bladder. He used utensils until a week ago and then began using his hands to eat. His family states he does not drink very much fluid. Has been eating and drinking small amounts here, receiving IV fluids. They are concerned for his quality of life, and state he was an independent person, a Education administratorpainter by trade, and would not want to live like he is now. They state he has a living will, but none of the family members know where it is .   Dr. Katheren ShamsSalary to speak with son William BusmanJames Jr. [facility](POA) today.      Symptom Management:   Consider Trazodone for nighttime agitation.    Prognosis:   Poor.  Poor PO intake. Dementia. Afib with RVR on amiodarone drip. Has pulled lines and drains at night which were replaced. Currently in mittens.  Length of Stay: 2  Current Medications: Scheduled Meds:  . aspirin EC  81 mg Oral Daily  . cephALEXin  500 mg Oral Q12H  . enoxaparin (LOVENOX) injection  40 mg Subcutaneous Q24H  . feeding supplement (ENSURE ENLIVE)  237 mL Oral BID BM  . levETIRAcetam  500 mg Oral BID  . metoprolol succinate  50 mg Oral Daily  . multivitamin with  minerals  1 tablet Oral Daily  . pantoprazole  40 mg Oral QAC breakfast  . simvastatin  20 mg Oral Daily    Continuous Infusions: . sodium chloride 100 mL/hr at 05/09/17 1053  . amiodarone 30 mg/hr (05/10/17 0952)    PRN Meds: acetaminophen **OR** acetaminophen, haloperidol lactate, ondansetron **OR** ondansetron (ZOFRAN) IV, temazepam  Physical Exam  Constitutional: No distress.  Pulmonary/Chest: Effort normal.  Neurological:  Opens eyes  briefly and closes them intermittently.             Vital Signs: BP (!) 136/91 (BP Location: Left Arm)   Pulse 95   Temp 99.1 F (37.3 C) (Oral)   Resp 19   Ht 6' (1.829 m)   Wt 67.9 kg (149 lb 11.1 oz)   SpO2 100%   BMI 20.30 kg/m  SpO2: SpO2: 100 % O2 Device: O2 Device: Room Air O2 Flow Rate:    Intake/output summary:   Intake/Output Summary (Last 24 hours) at 05/10/2017 1418 Last data filed at 05/10/2017 0981 Gross per 24 hour  Intake 400 ml  Output -  Net 400 ml   LBM: Last BM Date: 05/09/17 Baseline Weight: Weight: 70.3 kg (155 lb) Most recent weight: Weight: 67.9 kg (149 lb 11.1 oz)       Palliative Assessment/Data: 30%    Flowsheet Rows     Most Recent Value  Intake Tab  Referral Department  Hospitalist  Unit at Time of Referral  Med/Surg Unit  Palliative Care Primary Diagnosis  Neurology  Date Notified  05/09/17  Palliative Care Type  New Palliative care  Reason for referral  Clarify Goals of Care  Date of Admission  05/08/17  Date first seen by Palliative Care  05/09/17  # of days Palliative referral response time  0 Day(s)  # of days IP prior to Palliative referral  1  Clinical Assessment  Psychosocial & Spiritual Assessment  Palliative Care Outcomes      Patient Active Problem List   Diagnosis Date Noted  . A-fib (HCC) 05/08/2017  . 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 Dizziness 07/07/2016  . Cerebral hemorrhage(nontraumatic) (HCC) 06/30/2016  . Urolithiasis 03/28/2016  . Pyuria  03/28/2016  . Hydronephrosis 03/27/2016  . Cardiac arrhythmia 07/22/2015  . Atrial fibrillation with RVR (HCC) 02/24/2015  . Cardiac conduction disorder 02/04/2015  . Arthritis 02/04/2015  . Atrial fibrillation (HCC) 02/04/2015  . Acid reflux 02/04/2015  . H/O vertigo 02/04/2015  . HLD (hyperlipidemia) 02/04/2015  . BP (high blood pressure) 02/04/2015  . Calculus of kidney 02/04/2015  . Sick sinus syndrome (HCC) 02/04/2015  . Fungal infection of nail 02/04/2015  . Type 2 diabetes mellitus (HCC) 02/04/2015  . Sepsis (HCC) 12/10/2014  . Systemic infection (HCC) 12/10/2014  . Sepsis(995.91) 12/10/2014  . Altered bowel function 10/13/2014  . Chronic diarrhea 10/13/2014  . Change in bowel habits 10/13/2014  . Senile dementia of Alzheimer's type 09/20/2014  . Alzheimer's dementia, late onset 09/20/2014  . Drug resistance 10/22/2013    Palliative Care Assessment & Plan   Patient Profile: JamesThomasis a84 y.o.malewith a known history of atrial fibrillation, dementia, Recurrent kidney stones and a previous history of stroke who is brought by his family due to progressive confusion and agitation.    Assessment/ Recommendations/Plan: If patient does not improve given his lethargy and poor oral intake, patient would be resonably appropriate for hospice facility placement. If he shows improvement, depending on level of improvement, home with hospice vs palliative. If family decides on SNF placement, recommend outpatient palliative to follow.    Code Status:    Code Status Orders  (From admission, onward)        Start     Ordered   05/09/17 1324  Do not attempt resuscitation (DNR)  Continuous    Question Answer Comment  In the event of cardiac or respiratory ARREST Do not call a "code blue"   In the event of cardiac  or respiratory ARREST Do not perform Intubation, CPR, defibrillation or ACLS   In the event of cardiac or respiratory ARREST Use medication by any route,  position, wound care, and other measures to relive pain and suffering. May use oxygen, suction and manual treatment of airway obstruction as needed for comfort.      05/09/17 1323    Code Status History    Date Active Date Inactive Code Status Order ID Comments User Context   05/08/2017 1725 05/09/2017 1323 Full Code [id number]  Auburn Bilberry, MD Inpatient   07/07/2016 1836 07/09/2016 1304 Full Code [id number]  Katha Hamming, MD ED   04/12/2016 2328 04/15/2016 1744 Full Code [id number]  Oralia Manis, MD Inpatient   03/27/2016 1709 03/28/2016 2209 Full Code [id number]  Ramonita Lab, MD Inpatient   02/24/2015 1523 02/25/2015 1743 Full Code [id number]  Katharina Caper, MD Inpatient   12/10/2014 0646 12/14/2014 1416 Full Code [id number]  Arnaldo Natal, MD Inpatient        Care plan was discussed with Dr. Katheren Shams  Thank you for allowing the Palliative Medicine Team to assist in the care of this patient.   Time In: 9:10 Time Out: 10:30 Total Time 1 hour 20 min Prolonged Time Billed  yes      Greater than 50%  of this time was spent counseling and coordinating care related to the above assessment and plan.  Morton Stall, NP  Please contact Palliative Medicine Team phone at [phone] for questions and concerns.

## 2017-05-10 NOTE — Progress Notes (Signed)
Pt very agitated,confused,trying to get out of bed throughout the shift. Pt unable to sleep any this shift.

## 2017-05-10 NOTE — Progress Notes (Signed)
[employer] [facility] [place] at [facility]   PATIENT NAME: Kemani Demarais    MR#:  [id number]  DATE OF BIRTH:  12-30-1932  SUBJECTIVE:  CHIEF COMPLAINT:   Chief Complaint  Patient presents with  . Weakness  Patient is much more awake, alert, able to talk, is confused/disoriented, the patient's sister and brother at the bedside-states that he is at his near baseline, patient remains restless/agitated throughout the night, currently on amiodarone drip for rate control  REVIEW OF SYSTEMS:  CONSTITUTIONAL: No fever, fatigue or weakness.  EYES: No blurred or double vision.  EARS, NOSE, AND THROAT: No tinnitus or ear pain.  RESPIRATORY: No cough, shortness of breath, wheezing or hemoptysis.  CARDIOVASCULAR: No chest pain, orthopnea, edema.  GASTROINTESTINAL: No nausea, vomiting, diarrhea or abdominal pain.  GENITOURINARY: No dysuria, hematuria.  ENDOCRINE: No polyuria, nocturia,  HEMATOLOGY: No anemia, easy bruising or bleeding SKIN: No rash or lesion. MUSCULOSKELETAL: No joint pain or arthritis.   NEUROLOGIC: No tingling, numbness, weakness.  PSYCHIATRY: No anxiety or depression.   ROS  DRUG ALLERGIES:   Allergies  Allergen Reactions  . Codeine Other (See Comments)    Causes side effects  . Penicillin G Rash    Has patient had a PCN reaction causing immediate rash, facial/tongue/throat swelling, SOB or lightheadedness with hypotension: Yes Has patient had a PCN reaction causing severe rash involving mucus membranes or skin necrosis: No Has patient had a PCN reaction that required hospitalization: No Has patient had a PCN reaction occurring within the last 10 years: No If all of the above answers are "NO", then may proceed with Cephalosporin use.  . Sulfa Antibiotics Rash    Pt and fam are saying he does not have allergy to sulfa now.    VITALS:  Blood pressure (!) 136/91, pulse 95, temperature 99.1 F (37.3 C), temperature source Oral, resp. rate 19, height 6' (1.829  m), weight 67.9 kg (149 lb 11.1 oz), SpO2 100 %.  PHYSICAL EXAMINATION:  GENERAL:  84 y.o.-year-old patient lying in the bed with no acute distress.  EYES: Pupils equal, round, reactive to light and accommodation. No scleral icterus. Extraocular muscles intact.  HEENT: Head atraumatic, normocephalic. Oropharynx and nasopharynx clear.  NECK:  Supple, no jugular venous distention. No thyroid enlargement, no tenderness.  LUNGS: Normal breath sounds bilaterally, no wheezing, rales,rhonchi or crepitation. No use of accessory muscles of respiration.  CARDIOVASCULAR: S1, S2 normal. No murmurs, rubs, or gallops.  ABDOMEN: Soft, nontender, nondistended. Bowel sounds present. No organomegaly or mass.  EXTREMITIES: No pedal edema, cyanosis, or clubbing.  NEUROLOGIC: Cranial nerves II through XII are intact. Muscle strength 5/5 in all extremities. Sensation intact. Gait not checked.  PSYCHIATRIC: The patient is alert and oriented x 3.  SKIN: No obvious rash, lesion, or ulcer.   Physical Exam LABORATORY PANEL:   CBC Recent Labs  Lab 05/09/17 0501  WBC 13.8*  HGB 13.5  HCT 41.0  PLT 325   ------------------------------------------------------------------------------------------------------------------  Chemistries  Recent Labs  Lab 05/08/17 1211 05/08/17 1913 05/09/17 0501  NA 143  --  145  K 3.4*  --  3.7  CL 104  --  108  CO2 27  --  29  GLUCOSE 175*  --  116*  BUN 24*  --  22*  CREATININE 1.05  --  1.03  CALCIUM 9.3  --  9.0  MG  --  1.9  --   AST 46*  --   --   ALT 14*  --   --  ALKPHOS 79  --   --   BILITOT 1.5*  --   --    ------------------------------------------------------------------------------------------------------------------  Cardiac Enzymes Recent Labs  Lab 05/08/17 1211 05/09/17 0924  TROPONINI <0.03 <0.03   ------------------------------------------------------------------------------------------------------------------  RADIOLOGY:  Dg Chest 1  View  Result Date: 05/08/2017 CLINICAL DATA:  84 year old male with confusion.  Initial encounter. EXAM: CHEST  1 VIEW COMPARISON:  04/12/2016 chest x-ray. FINDINGS: Cardiomegaly. Stable central pulmonary vascular prominence. Stable chronic lung changes. No infiltrate, congestive heart failure or pneumothorax. No plain film evidence of pulmonary malignancy. Calcified mildly tortuous aorta. No acute osseous abnormality. IMPRESSION: Cardiomegaly. Stable central pulmonary vascular prominence. Stable chronic lung changes. Aortic Atherosclerosis (ICD10-I70.0). Electronically Signed   By: Lacy Duverney M.D.   On: 05/08/2017 11:31   Ct Head Wo Contrast  Result Date: 05/08/2017 CLINICAL DATA:  Increasing confusion EXAM: CT HEAD WITHOUT CONTRAST TECHNIQUE: Contiguous axial images were obtained from the base of the skull through the vertex without intravenous contrast. COMPARISON:  04/06/2017 FINDINGS: Brain: Diffuse atrophic changes are noted. Changes of prior ischemia in the left occipital lobe with encephalomalacia are again noted. No findings to suggest acute hemorrhage, acute infarction or space-occupying mass lesion are seen. Vascular: No hyperdense vessel or unexpected calcification. Skull: Normal. Negative for fracture or focal lesion. Sinuses/Orbits: Mild mucosal changes are noted in the right maxillary antrum stable from the prior exam. Other: None IMPRESSION: Chronic changes without acute abnormality. Electronically Signed   By: Alcide Clever M.D.   On: 05/08/2017 11:39   Dg Foot 2 Views Right  Result Date: 05/08/2017 CLINICAL DATA:  Right foot pain after fall last month. Difficulty ambulating. EXAM: RIGHT FOOT - 2 VIEW COMPARISON:  None. FINDINGS: There is no evidence of fracture or dislocation. Moderate-to-marked joint space narrowing with bone-on-bone apposition of the first MTP articulation. Osteophytes are noted dorsally across this joint with mild periarticular calcifications possibly representing  ligamentous calcifications. No suspicious osseous lesions. Tiny enthesophyte off the dorsum of the calcaneus. Mild joint space narrowing of the tibiotalar, subtalar and midfoot articulations. There is slight overlap of the fourth and fifth toes with splayed appearance of the webspace between the third and fourth toes. No definite soft tissue mass is seen accounting for this appearance. Soft tissues are unremarkable. IMPRESSION: Mild degenerative joint space narrowing of the tibiotalar, subtalar and midfoot articulations. Moderate-to-marked joint space narrowing with osteophyte formation and bone-on-bone apposition of the first MTP is identified. No acute fracture joint dislocations. Electronically Signed   By: Tollie Eth M.D.   On: 05/08/2017 18:33    ASSESSMENT AND PLAN:  Patient is a 84 year old white male presenting to the ER with worsening confusion  1.A. fib with RVR Improved Continue p.o. Lopressor, amiodarone drip, did receive IV digoxin initially, continue aspirin as patient is not a candidate for full dose anticoagulation due to fall risk, cardiology input appreciated, Ces neg. For ACS, echocardiogram noted for ejection fraction 45-50%  2.Acute encephalopathy with underlying dementia Suspect related to dehydration recent fall and progressive decline No evidence of infection noted Continue to hold psychotropic meds, neurochecks per routine, aspiration/fall/skin care precautions while in house, Restoril for sleep Hospital course complicated due to patient's uncooperativeness/agitation, pulling out IVs  3.Leukocytosis, acute Resolving Most likely secondary to stress reaction  urinalysis and chest x-ray are negative  4.Essential hypertension Stable on current regiment Hydrochlorothiazide should be discontinued indefinitely given patient's poor p.o. intake/advanced age   72.Dementia, chronic Stable per family Plan of care as stated above  6.Hyperlipidemia,  unspecified Stable continue Zocor  DNR status Condition stable  Prognosis fair  DVT prophylaxis with Lovenox  Disposition to skilled nursing facility versus rehab with memory unit if patient does not improve versus home with family with home health services    All the records are reviewed and case discussed with Care Management/Social Workerr. Management plans discussed with the patient, family and they are in agreement.  CODE STATUS: dnr  TOTAL TIME TAKING CARE OF THIS PATIENT: 35 minutes.     POSSIBLE D/C IN 1-3 DAYS, DEPENDING ON CLINICAL CONDITION.   Evelena AsaMontell D Salary M.D on 05/10/2017   Between 7am to 6pm - Pager - [phone]  After 6pm go to [email or web address] - password [email or web address]  [facility] [place] [facility]  Office  [phone]  CC: Primary care physician; Marguarite ArbourSparks, Jeffrey D, MD  Note: This dictation was prepared with Dragon dictation along with smaller phrase technology. Any transcriptional errors that result from this process are unintentional.

## 2017-05-10 NOTE — Progress Notes (Signed)
Throughout the evening, pt continues to be restless. Repositioning, tolieting, hydration, snack,offered, lights dimmed without any calming effect.

## 2017-05-10 NOTE — Care Management (Signed)
[facility] Aide SW.  Asked agency to see if there have been any concerns regarding cognitive issues

## 2017-05-10 NOTE — Plan of Care (Signed)
Pt with dementia Problem: Education: Goal: Knowledge of General Education information will improve Outcome: Not Progressing   Problem: Nutrition: Goal: Adequate nutrition will be maintained Outcome: Not Progressing   Problem: Safety: Goal: Ability to remain free from injury will improve Outcome: Not Progressing   Problem: Skin Integrity: Goal: Risk for impaired skin integrity will decrease Outcome: Not Progressing   Problem: Education: Goal: Knowledge of disease or condition will improve Outcome: Not Progressing

## 2017-05-10 NOTE — Progress Notes (Signed)
Pt with audible wheezing. MD notified. Orders placed. Will continue to monitor and assess

## 2017-05-11 ENCOUNTER — Other Ambulatory Visit: Payer: Self-pay

## 2017-05-11 MED ORDER — AMIODARONE HCL 200 MG PO TABS
200.0000 mg | ORAL_TABLET | Freq: Every day | ORAL | Status: DC
  Administered 2017-05-11 – 2017-05-12 (×2): 200 mg via ORAL
  Filled 2017-05-11 (×2): qty 1

## 2017-05-11 NOTE — Care Management (Signed)
Physical therapy is recommending SNF.

## 2017-05-11 NOTE — Evaluation (Signed)
Physical Therapy Evaluation Patient Details Name: William Lozano Sr. MRN: [id number] DOB: July 24, 1932 Today's Date: 05/11/2017   History of Present Illness  Pt admitted for Afib. Pt with elevated HR this admission, currently on cardizem drip with HR under control. History includes dementia, HTN, GERD, and CVA on 06/2016. Pt is poor historian and most of history obtained from CSW who spoke to son. Per son, pt with fast decline in past 2 weeks including falls and increased AMS.   Clinical Impression  Pt is a pleasant 84 year old confused male who was admitted for Afib. Recommend further sessions in AM time as pt begins to sun down per RN staff. Pt performs bed mobility with mod assist and able to sit at EOB, however very unsteady, unable to tolerate more than 10 seconds and then has uncontrolled descent back to bed. Not safe for further mobility at this time. Pt with acute cognition decline, expect cognition to improve as medical status improves. Pt demonstrates deficits with strength/mobility/cognition. 2 weeks ago, pt ambulating with little to no assist, pt is currently not at baseline level. Would benefit from skilled PT to address above deficits and promote optimal return to PLOF; recommend transition to STR upon discharge from acute hospitalization. Will keep on as a trial to see if pt able to make progress.       Follow Up Recommendations SNF    Equipment Recommendations  None recommended by PT    Recommendations for Other Services       Precautions / Restrictions Precautions Precautions: Fall Restrictions Weight Bearing Restrictions: No      Mobility  Bed Mobility Overal bed mobility: Needs Assistance Bed Mobility: Supine to Sit     Supine to sit: Mod assist     General bed mobility comments: needs assist for sliding B LEs off bed. Has difficulty following commands. Once seated, pt with increased postural sway in all direction, unable to maintain seated balance. Pt then has  uncontrolled descent back to bed. Unable to further attempt mobility.  Transfers                 General transfer comment: not safe to perform  Ambulation/Gait                Stairs            Wheelchair Mobility    Modified Rankin (Stroke Patients Only)       Balance Overall balance assessment: Needs assistance;History of Falls Sitting-balance support: Feet supported;Bilateral upper extremity supported Sitting balance-Leahy Scale: Poor                                       Pertinent Vitals/Pain Pain Assessment: No/denies pain    Home Living Family/patient expects to be discharged to:: Private residence Living Arrangements: Alone Available Help at Discharge: Friend(s) Type of Home: House Home Access: Stairs to enter Entrance Stairs-Rails: Right;Left;Can reach both Entrance Stairs-Number of Steps: 3 Home Layout: One level Home Equipment: Cane - single point;Walker - 2 wheels;Wheelchair - manual      Prior Function Level of Independence: Independent with assistive device(s)         Comments: Pt was using SPC and WC for long distances. Recently had fall in home     Hand Dominance        Extremity/Trunk Assessment   Upper Extremity Assessment Upper Extremity Assessment: Generalized weakness;Difficult to assess  due to impaired cognition(B UE grossly 4/5)    Lower Extremity Assessment Lower Extremity Assessment: Generalized weakness;Difficult to assess due to impaired cognition(B LE grossly 3+/5)       Communication   Communication: No difficulties  Cognition Arousal/Alertness: Awake/alert Behavior During Therapy: Agitated;Restless;Impulsive Overall Cognitive Status: Impaired/Different from baseline                                        General Comments      Exercises Other Exercises Other Exercises: unable to redirect to focus on ther-ex   Assessment/Plan    PT Assessment Patient needs  continued PT services  PT Problem List Decreased strength;Decreased activity tolerance;Decreased balance;Decreased mobility;Decreased cognition       PT Treatment Interventions Gait training;DME instruction;Therapeutic exercise;Balance training;Cognitive remediation    PT Goals (Current goals can be found in the Care Plan section)  Acute Rehab PT Goals Patient Stated Goal: unable to state PT Goal Formulation: Patient unable to participate in goal setting Time For Goal Achievement: 05/25/17 Potential to Achieve Goals: Fair    Frequency (3 day trial)   Barriers to discharge Decreased caregiver support      Co-evaluation               AM-PAC PT "6 Clicks" Daily Activity  Outcome Measure Difficulty turning over in bed (including adjusting bedclothes, sheets and blankets)?: Unable Difficulty moving from lying on back to sitting on the side of the bed? : Unable Difficulty sitting down on and standing up from a chair with arms (e.g., wheelchair, bedside commode, etc,.)?: Unable Help needed moving to and from a bed to chair (including a wheelchair)?: A Lot Help needed walking in hospital room?: A Lot Help needed climbing 3-5 steps with a railing? : Total 6 Click Score: 8    End of Session   Activity Tolerance: Treatment limited secondary to agitation Patient left: in bed;with bed alarm set Nurse Communication: Mobility status PT Visit Diagnosis: Muscle weakness (generalized) (M62.81);Difficulty in walking, not elsewhere classified (R26.2)    Time: [id number] PT Time Calculation (min) (ACUTE ONLY): 13 min   Charges:   PT Evaluation $PT Eval Moderate Complexity: 1 Mod     PT G CodesElizabeth Palau:        Zakaree Mcclenahan, PT, DPT [phone]   Moana Munford 05/11/2017, 5:03 PM

## 2017-05-11 NOTE — NC FL2 (Signed)
[place] MEDICAID FL2 LEVEL OF CARE SCREENING TOOL     IDENTIFICATION  Patient Name: William COPEN Sr. Birthdate: [birth year] Sex: male Admission Date (Current Location): 05/08/2017  [facility] and [place] Number:  Chiropodist and Address:  [facility], [address] [place], [place] [address]      Provider Number: [id number]  Attending Physician Name and Address:  Bertrum Sol, MD  Relative Name and Phone Number:     Antwone, Capozzoli   [phone]   Xan, Ingraham   [phone]   Zhong,John Brother [phone]       Current Level of Care: Hospital Recommended Level of Care: Skilled Nursing Facility Prior Approval Number:    Date Approved/Denied:   PASRR Number: [id number]  Discharge Plan: SNF    Current Diagnoses: Patient Active Problem List   Diagnosis Date Noted  . A-fib (HCC) 05/08/2017  . 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 Dizziness 07/07/2016  . Cerebral hemorrhage(nontraumatic) (HCC) 06/30/2016  . Urolithiasis 03/28/2016  . Pyuria 03/28/2016  . Hydronephrosis 03/27/2016  . Cardiac arrhythmia 07/22/2015  . Atrial fibrillation with RVR (HCC) 02/24/2015  . Cardiac conduction disorder 02/04/2015  . Arthritis 02/04/2015  . Atrial fibrillation (HCC) 02/04/2015  . Acid reflux 02/04/2015  . H/O vertigo 02/04/2015  . HLD (hyperlipidemia) 02/04/2015  . BP (high blood pressure) 02/04/2015  . Calculus of kidney 02/04/2015  . Sick sinus syndrome (HCC) 02/04/2015  . Fungal infection of nail 02/04/2015  . Type 2 diabetes mellitus (HCC) 02/04/2015  . Sepsis (HCC) 12/10/2014  . Systemic infection (HCC) 12/10/2014  . Sepsis(995.91) 12/10/2014  . Altered bowel function 10/13/2014  . Chronic diarrhea 10/13/2014  . Change in bowel habits 10/13/2014  . Senile dementia of Alzheimer's type 09/20/2014  . Alzheimer's dementia, late onset 09/20/2014  . Drug resistance 10/22/2013    Orientation  RESPIRATION BLADDER Height & Weight     Self, Place, Situation  Normal Incontinent Weight: 149 lb 11.1 oz (67.9 kg) Height:  6' (182.9 cm)  BEHAVIORAL SYMPTOMS/MOOD NEUROLOGICAL BOWEL NUTRITION STATUS      Incontinent Diet(Dysphagia 3 diet)  AMBULATORY STATUS COMMUNICATION OF NEEDS Skin   Limited Assist Verbally Normal                       Personal Care Assistance Level of Assistance  Bathing, Feeding, Dressing Bathing Assistance: Limited assistance Feeding assistance: Independent Dressing Assistance: Limited assistance     Functional Limitations Info  Sight, Hearing, Speech Sight Info: Adequate Hearing Info: Adequate Speech Info: Adequate    SPECIAL CARE FACTORS FREQUENCY  PT (By licensed PT)     PT Frequency: 5x a week              Contractures      Additional Factors Info  Allergies, Code Status Code Status Info: DNR Allergies Info: CODEINE, PENICILLIN G, SULFA ANTIBIOTICS            Current Medications (05/11/2017):  This is the current hospital active medication list Current Facility-Administered Medications  Medication Dose Route Frequency Provider Last Rate Last Dose  . acetaminophen (TYLENOL) tablet 650 mg  650 mg Oral Q6H PRN Auburn Bilberry, MD       Or  . acetaminophen (TYLENOL) suppository 650 mg  650 mg Rectal Q6H PRN Auburn Bilberry, MD      . amiodarone (PACERONE) tablet 200 mg  200 mg Oral Daily Salary, Montell D, MD   200 mg at  05/11/17 1507  . aspirin EC tablet 81 mg  81 mg Oral Daily Auburn BilberryPatel, Shreyang, MD   81 mg at 05/11/17 0931  . enoxaparin (LOVENOX) injection 40 mg  40 mg Subcutaneous Q24H Auburn BilberryPatel, Shreyang, MD   40 mg at 05/11/17 1630  . feeding supplement (ENSURE ENLIVE) (ENSURE ENLIVE) liquid 237 mL  237 mL Oral BID BM Salary, Montell D, MD   237 mL at 05/11/17 1315  . haloperidol lactate (HALDOL) injection 1 mg  1 mg Intravenous Q6H PRN Auburn BilberryPatel, Shreyang, MD   1 mg at 05/10/17 0423  . levETIRAcetam (KEPPRA) tablet 500 mg  500 mg Oral  QHS Salary, Montell D, MD   500 mg at 05/10/17 2331  . metoprolol succinate (TOPROL-XL) 24 hr tablet 50 mg  50 mg Oral Daily Dalia HeadingFath, Kenneth A, MD   50 mg at 05/11/17 0931  . multivitamin with minerals tablet 1 tablet  1 tablet Oral Daily Salary, Evelena AsaMontell D, MD   1 tablet at 05/11/17 0931  . ondansetron (ZOFRAN) tablet 4 mg  4 mg Oral Q6H PRN Auburn BilberryPatel, Shreyang, MD       Or  . ondansetron (ZOFRAN) injection 4 mg  4 mg Intravenous Q6H PRN Auburn BilberryPatel, Shreyang, MD      . pantoprazole (PROTONIX) EC tablet 40 mg  40 mg Oral QAC breakfast Auburn BilberryPatel, Shreyang, MD   40 mg at 05/11/17 0931  . simvastatin (ZOCOR) tablet 20 mg  20 mg Oral Daily Auburn BilberryPatel, Shreyang, MD   20 mg at 05/11/17 0931  . temazepam (RESTORIL) capsule 15 mg  15 mg Oral QHS PRN Salary, Evelena AsaMontell D, MD         Discharge Medications: Please see discharge summary for a list of discharge medications.  Relevant Imaging Results:  Relevant Lab Results:   Additional Information SSN [id number]  Darleene Cleavernterhaus, Anu Stagner R, [place]

## 2017-05-11 NOTE — Evaluation (Signed)
Clinical/Bedside Swallow Evaluation Patient Details  Name: FITZROY MIKAMI Sr. MRN: [id number] Date of Birth: [birth year]  Today's Date: 05/11/2017 Time: SLP Start Time (ACUTE ONLY): 1415 SLP Stop Time (ACUTE ONLY): 1437 SLP Time Calculation (min) (ACUTE ONLY): 22 min  Past Medical History:  Past Medical History:  Diagnosis Date  . Arthritis   . Atrial fibrillation (HCC) 2016   paroxysmal atrial fibrillation, sick sinus syndrome  . Complication of anesthesia    affects memory for a longer time after  . Dementia    alzheimers  . Essential hypertension   . GERD (gastroesophageal reflux disease)   . History of kidney stones   . Hyperlipemia   . Inguinal hernia recurrent bilateral   . Kidney stones   . Stroke [facility])    Past Surgical History:  Past Surgical History:  Procedure Laterality Date  . CYSTOSCOPY W/ RETROGRADES Bilateral 03/27/2016   Procedure: CYSTOSCOPY WITH RETROGRADE PYELOGRAM;  Surgeon: Vanna Scotland, MD;  Location: [facility] ORS;  Service: Urology;  Laterality: Bilateral;  . CYSTOSCOPY W/ URETERAL STENT PLACEMENT Bilateral 02/24/2015   Procedure: CYSTOSCOPY WITH STENT REPLACEMENT;  Surgeon: Hildred Laser, MD;  Location: [facility];  Service: Urology;  Laterality: Bilateral;  . CYSTOSCOPY W/ URETERAL STENT PLACEMENT Left 04/11/2016   Procedure: CYSTOSCOPY WITH STENT REPLACEMENT;  Surgeon: Vanna Scotland, MD;  Location: [facility];  Service: Urology;  Laterality: Left;  . CYSTOSCOPY W/ URETERAL STENT REMOVAL Right 04/11/2016   Procedure: CYSTOSCOPY WITH STENT REMOVAL;  Surgeon: Vanna Scotland, MD;  Location: [facility];  Service: Urology;  Laterality: Right;  . CYSTOSCOPY WITH RETROGRADE PYELOGRAM, URETEROSCOPY AND STENT PLACEMENT Bilateral 12/10/2014   Procedure: CYSTOSCOPY WITH RETROGRADE PYELOGRAM, URETEROSCOPY AND STENT PLACEMENT;  Surgeon: Hildred Laser, MD;  Location: [facility];  Service: Urology;  Laterality: Bilateral;  . CYSTOSCOPY WITH STENT PLACEMENT Bilateral  12/10/2014   Procedure: CYSTOSCOPY WITH STENT PLACEMENT;  Surgeon: Hildred Laser, MD;  Location: [facility];  Service: Urology;  Laterality: Bilateral;  . CYSTOSCOPY WITH STENT PLACEMENT Bilateral 03/27/2016   Procedure: CYSTOSCOPY WITH STENT PLACEMENT;  Surgeon: Vanna Scotland, MD;  Location: [facility];  Service: Urology;  Laterality: Bilateral;  . EXTRACORPOREAL SHOCK WAVE LITHOTRIPSY Right 01/07/2015   Procedure: EXTRACORPOREAL SHOCK WAVE LITHOTRIPSY (ESWL);  Surgeon: Lorraine Lax, MD;  Location: [facility];  Service: Urology;  Laterality: Right;  . EXTRACORPOREAL SHOCK WAVE LITHOTRIPSY Left 01/21/2015   Procedure: EXTRACORPOREAL SHOCK WAVE LITHOTRIPSY (ESWL);  Surgeon: Hildred Laser, MD;  Location: [facility];  Service: Urology;  Laterality: Left;  . EYE SURGERY Bilateral    Cataract Extraction with IOL  . HERNIA REPAIR Bilateral    inguinal  . URETEROSCOPY WITH HOLMIUM LASER LITHOTRIPSY Bilateral 02/24/2015   Procedure: URETEROSCOPY WITH HOLMIUM LASER LITHOTRIPSY /STONE BASKETING;  Surgeon: Hildred Laser, MD;  Location: [facility];  Service: Urology;  Laterality: Bilateral;  . URETEROSCOPY WITH HOLMIUM LASER LITHOTRIPSY Bilateral 04/11/2016   Procedure: URETEROSCOPY WITH HOLMIUM LASER LITHOTRIPSY;  Surgeon: Vanna Scotland, MD;  Location: [facility];  Service: Urology;  Laterality: Bilateral;   HPI:  Owens Hara  is a 84 y.o. male with a known history of atrial fibrillation, osteoarthritis, dementia, essential hypertension, GERD, Recurrent kidney stones and a previous history of stroke who is brought by his family due to progressive confusion and agitation.  Patient's had a fall last month and according to his family friend and his brother who is here state that he is gotten worse since then.  Patient has not had much  pain however has had difficulty with ambulation.  He has not been eating or drinking much.  In the emergency room he was noticed to have A. fib with RVR.  He appears very  dehydrated.  He also has leukocytosis however there is no evidence of infection.  Patient is restless trying to get out of bed. Pt's sister reports she has observed coughing during meals.    Assessment / Plan / Recommendation Clinical Impression  Pt presents w/oropharyngeal dysphagia and moderate risk of aspiration. Pt given trials of thin water via cup and straw, puree, and solid consistencies. Pt demonstrated overt cough following sip of thin liquid by straw. No other overt s/s of aspiration were observed with any other tested consistency including thin water by cup. Pt only willing to consume one tsp of puree consistency, but appeared [facility]. Pt demonstrated mild oral phase deficits with solid cracker c/b increased mastication time. Oral mech exam revealed oral motor abilities to be [facility]. Pt is judged to be at increased risk of aspiration d/t mental status, confusion, agitation as well as deconditioning. Recommend Dysphagia III diet w/thin liquids (CUP only) and aspiration precautions to decrease risk of aspiration. Nsg in agreement.  SLP Visit Diagnosis: Dysphagia, oropharyngeal phase (R13.12)    Aspiration Risk  Moderate aspiration risk    Diet Recommendation Dysphagia 3 (Mech soft);Thin liquid;Other (Comment)(Thin by cup only)   Liquid Administration via: Cup;No straw Medication Administration: Crushed with puree Supervision: Staff to assist with self feeding;Intermittent supervision to cue for compensatory strategies Compensations: Minimize environmental distractions;Slow rate;Small sips/bites Postural Changes: Seated upright at 90 degrees    Other  Recommendations Oral Care Recommendations: Oral care BID;Staff/trained caregiver to provide oral care   Follow up Recommendations Skilled Nursing facility      Frequency and Duration min 2x/week  2 weeks       Prognosis Prognosis for Safe Diet Advancement: Fair Barriers to Reach Goals: Cognitive deficits      Swallow Study   General  Date of Onset: 05/10/17 HPI: Latanya MaudlinJames Stober  is a 84 y.o. male with a known history of atrial fibrillation, osteoarthritis, dementia, essential hypertension, GERD, Recurrent kidney stones and a previous history of stroke who is brought by his family due to progressive confusion and agitation.  Patient's had a fall last month and according to his family friend and his brother who is here state that he is gotten worse since then.  Patient has not had much pain however has had difficulty with ambulation.  He has not been eating or drinking much.  In the emergency room he was noticed to have A. fib with RVR.  He appears very dehydrated.  He also has leukocytosis however there is no evidence of infection.  Patient is restless trying to get out of bed. Pt's sister reports she has observed coughing during meals.  Type of Study: Bedside Swallow Evaluation Previous Swallow Assessment: none reported Diet Prior to this Study: Dysphagia 3 (soft);Thin liquids Temperature Spikes Noted: No Respiratory Status: Room air History of Recent Intubation: No Behavior/Cognition: Alert;Confused;Agitated;Impulsive;Requires cueing Oral Cavity Assessment: Dry Oral Care Completed by SLP: No Oral Cavity - Dentition: Adequate natural dentition Vision: (unable to assess this visit, however nsg reports he fed himself breakfast this morning.) Self-Feeding Abilities: Needs assist Patient Positioning: Upright in bed Baseline Vocal Quality: Normal Volitional Cough: Strong Volitional Swallow: Able to elicit    Oral/Motor/Sensory Function Overall Oral Motor/Sensory Function: Within functional limits   Ice Chips Ice chips: Not tested   Thin Liquid  Thin Liquid: Impaired Presentation: Straw;Cup Pharyngeal  Phase Impairments: Cough - Immediate(cough following sip of thin liquid by straw)    Nectar Thick Nectar Thick Liquid: Not tested   Honey Thick Honey Thick Liquid: Not tested   Puree Puree: Within functional limits Presentation:  Spoon Other Comments: Pt was only willing to take one tsp of puree   Solid   GO   Solid: Impaired Oral Phase Impairments: Impaired mastication Oral Phase Functional Implications: Oral residue        Mather,Maxey Ransom 05/11/2017,4:00 PM

## 2017-05-11 NOTE — Progress Notes (Addendum)
[facility] [place] at [facility]   PATIENT NAME: William Lozano    MR#:  [id number]  DATE OF BIRTH:  06/01/1932  SUBJECTIVE:  CHIEF COMPLAINT:   Chief Complaint  Patient presents with  . Weakness  Much improved this morning, patient is awake, alert, sister is at the bedside, patient is able to feed himself, speech therapy to see, physical therapy to evaluate/treat  REVIEW OF SYSTEMS:  CONSTITUTIONAL: No fever, fatigue or weakness.  EYES: No blurred or double vision.  EARS, NOSE, AND THROAT: No tinnitus or ear pain.  RESPIRATORY: No cough, shortness of breath, wheezing or hemoptysis.  CARDIOVASCULAR: No chest pain, orthopnea, edema.  GASTROINTESTINAL: No nausea, vomiting, diarrhea or abdominal pain.  GENITOURINARY: No dysuria, hematuria.  ENDOCRINE: No polyuria, nocturia,  HEMATOLOGY: No anemia, easy bruising or bleeding SKIN: No rash or lesion. MUSCULOSKELETAL: No joint pain or arthritis.   NEUROLOGIC: No tingling, numbness, weakness.  PSYCHIATRY: No anxiety or depression.   ROS  DRUG ALLERGIES:   Allergies  Allergen Reactions  . Codeine Other (See Comments)    Causes side effects  . Penicillin G Rash    Has patient had a PCN reaction causing immediate rash, facial/tongue/throat swelling, SOB or lightheadedness with hypotension: Yes Has patient had a PCN reaction causing severe rash involving mucus membranes or skin necrosis: No Has patient had a PCN reaction that required hospitalization: No Has patient had a PCN reaction occurring within the last 10 years: No If all of the above answers are "NO", then may proceed with Cephalosporin use.  . Sulfa Antibiotics Rash    Pt and fam are saying he does not have allergy to sulfa now.    VITALS:  Blood pressure 133/82, pulse 82, temperature 98.4 F (36.9 C), temperature source Oral, resp. rate 18, height 6' (1.829 m), weight 67.9 kg (149 lb 11.1 oz), SpO2 98 %.  PHYSICAL EXAMINATION:  GENERAL:  84  y.o.-year-old patient lying in the bed with no acute distress.  EYES: Pupils equal, round, reactive to light and accommodation. No scleral icterus. Extraocular muscles intact.  HEENT: Head atraumatic, normocephalic. Oropharynx and nasopharynx clear.  NECK:  Supple, no jugular venous distention. No thyroid enlargement, no tenderness.  LUNGS: Normal breath sounds bilaterally, no wheezing, rales,rhonchi or crepitation. No use of accessory muscles of respiration.  CARDIOVASCULAR: S1, S2 normal. No murmurs, rubs, or gallops.  ABDOMEN: Soft, nontender, nondistended. Bowel sounds present. No organomegaly or mass.  EXTREMITIES: No pedal edema, cyanosis, or clubbing.  NEUROLOGIC: Cranial nerves II through XII are intact. Muscle strength 5/5 in all extremities. Sensation intact. Gait not checked.  PSYCHIATRIC: The patient is alert and oriented x 3.  SKIN: No obvious rash, lesion, or ulcer.   Physical Exam LABORATORY PANEL:   CBC Recent Labs  Lab 05/09/17 0501  WBC 13.8*  HGB 13.5  HCT 41.0  PLT 325   ------------------------------------------------------------------------------------------------------------------  Chemistries  Recent Labs  Lab 05/08/17 1211 05/08/17 1913 05/09/17 0501  NA 143  --  145  K 3.4*  --  3.7  CL 104  --  108  CO2 27  --  29  GLUCOSE 175*  --  116*  BUN 24*  --  22*  CREATININE 1.05  --  1.03  CALCIUM 9.3  --  9.0  MG  --  1.9  --   AST 46*  --   --   ALT 14*  --   --   ALKPHOS 79  --   --  BILITOT 1.5*  --   --    ------------------------------------------------------------------------------------------------------------------  Cardiac Enzymes Recent Labs  Lab 05/08/17 1211 05/09/17 0924  TROPONINI <0.03 <0.03   ------------------------------------------------------------------------------------------------------------------  RADIOLOGY:  No results found.  ASSESSMENT AND PLAN:  Patient is a 84 year old white male presenting to the ER with  worsening confusion  1.A. fib with RVR Resolved Continue p.o. Lopressor, amiodarone drip weaned off-change to p.o., digoxin discontinued, continue aspirin as patient isnot a candidate for full dose anticoagulation due to fall risk, cardiology did see patient while in house, Ces negative for ACS, echocardiogram noted for ejection fraction 45-50%  2.Acute encephalopathy with underlying dementia Resolved Suspect related to dehydration recent fall and progressive decline No evidence of infection noted Continue to hold psychotropic meds, neurochecks per routine, aspiration/fall/skin care precautions while in house, Restoril for sleep, speech therapy to see, physical therapy to evaluate/treat Early hospital course complicated due to patient's intermittent uncooperativeness/agitation  3.Leukocytosis, acute Resolving Most likely secondary tostress reaction  urinalysis and chest x-ray are negative  4.Essential hypertension Stable on current regiment Hydrochlorothiazide should be discontinued indefinitely given patient's poor p.o. intake/advanced age  44.Dementia, chronic Stable per family Increase nursing care as needed, aspiration/fall/skin care precautions  6.Hyperlipidemia,unspecified Stable continue Zocor  DNR status Condition stable  Prognosis fair  DVT prophylaxis with Lovenox  Disposition to skilled nursing facility versus rehab with memory unit  All the records are reviewed and case discussed with Care Management/Social Workerr. Management plans discussed with the patient, family and they are in agreement.  CODE STATUS: dnr  TOTAL TIME TAKING CARE OF THIS PATIENT: 35 minutes.     POSSIBLE D/C IN 1-2 DAYS, DEPENDING ON CLINICAL CONDITION.   Evelena Asa Salary M.D on 05/11/2017   Between 7am to 6pm - Pager - [phone]  After 6pm go to [email or web address] - password [employer]  [facility]  Office  [phone]  CC: Primary care  physician; Marguarite Arbour, MD  Note: This dictation was prepared with Dragon dictation along with smaller phrase technology. Any transcriptional errors that result from this process are unintentional.

## 2017-05-11 NOTE — Progress Notes (Signed)
Attempted to call son [name] with an update. Family members in the room will update son.

## 2017-05-11 NOTE — Progress Notes (Signed)
MD salary was notified that pt was having a 5 beat run of vtach. No further orders at this time.

## 2017-05-11 NOTE — Progress Notes (Signed)
Pt rested better this pm

## 2017-05-11 NOTE — Progress Notes (Signed)
Notified Md of pt on amiodarone drip at 16.7 for the maximum dose still with HR afib with rvr at times. Orders placed to start pt on cardizem drip. Will continue to monitor and assess.

## 2017-05-11 NOTE — Clinical Social Work Note (Signed)
Clinical Social Work Assessment  Patient Details  Name: William REGGIO Sr. MRN: [id number] Date of Birth: Aug 28, 1932  Date of referral:  05/11/17               Reason for consult:  Facility Placement                Permission sought to share information with:  Facility Medical sales representative, Family Supports Permission granted to share information::  Yes, Verbal Permission Granted  Name::     Tyrann, Donaho   [phone] or Lumir, Demetriou   [phone] or  Gertrude, Tarbet [phone] or Effie Berkshire [phone]   Agency::  SNF admissions  Relationship::     Contact Information:     Housing/Transportation Living arrangements for the past 2 months:  Single Family Home Source of Information:  Patient, Adult Children, Medical Team Patient Interpreter Needed:  None Criminal Activity/Legal Involvement Pertinent to Current Situation/Hospitalization:  No - Comment as needed Significant Relationships:  Adult Children, Significant Other, Other Family Members Lives with:  Self Do you feel safe going back to the place where you live?  No Need for family participation in patient care:  Yes (Comment)  Care giving concerns:  Patient and family feel he needs some short term rehab before he is able to return back home.   Social Worker assessment / plan:    CSW spoke to patient and his family who were in the room and explained to them about the process of trying to get placed in a SNF for short term rehab.  Patient acknowledged understanding of what rehab was.  CSW also spoke to patient's son Trey Paula [phone] via phone and explained to him how insurance will pay for stay and also what the difference is between ALF and SNF placement for short term rehab.  Patient son stated that patient was doing ok at home until about two weeks ago when patient had a fall and came to the hospital, patient was discharged home with home health.  Patient's son stated they have been alternating turns  staying at patient's home and they all feel he needs some rehab first before he is able to return back home.  Patient's son reported that they have considered having patient go to SNF and then transition to an ALF depending on how patient progresses.  Patient son also reported that patient has a girlfriend who stays with him, but she has her own health issues, and she is not able to take care of patient at his current level of functioning.  Patient's son was in agreement to having patient go to SNF, CSW was given permission to begin bed search in [place].  Patient's son did not express any other questions or concerns.   Employment status:  Retired Health and safety inspector:  Medicare PT Recommendations:  Skilled Nursing Facility Information / Referral to community resources:  Skilled Nursing Facility  Patient/Family's Response to care:  Patient's family is in agreement to going to SNF for short term rehab.  Patient/Family's Understanding of and Emotional Response to Diagnosis, Current Treatment, and Prognosis:  Patient's family is aware of current treatment plan and prognosis.  Emotional Assessment Appearance:  Appears stated age Attitude/Demeanor/Rapport:    Affect (typically observed):  Calm, Appropriate Orientation:  Oriented to Self, Oriented to Situation, Oriented to Place Alcohol / Substance use:  Not Applicable Psych involvement (Current and /or in the community):  No (Comment)  Discharge Needs  Concerns to be addressed:  Lack of Support  Readmission within the last 30 days:  No Current discharge risk:  Cognitively Impaired, Lack of support system, Lives alone Barriers to Discharge:  Continued Medical Work up   [place] [employer], Jailan Trimm R, LCSWA 05/11/2017, 6:44 PM

## 2017-05-11 NOTE — Clinical Social Work Note (Signed)
CSW spoke to patient and his family who were in the room and explained to them about the process of trying to get placed in a SNF for short term rehab.  Patient acknowledged understanding of what rehab was.  CSW also spoke to patient's son William Lozano [phone] via phone and explained to him how insurance will pay for stay and also what the difference is between ALF and SNF placement for short term rehab.  Patient son stated that patient was doing ok at home until about two weeks ago when patient had a fall and came to the hospital, patient was discharged home with home health.  Patient's son stated they have been alternating turns staying at patient's home and they all feel he needs some rehab first before he is able to return back home.  Patient's son reported that they have considered having patient go to SNF and then transition to an ALF depending on how patient progresses.  Patient son also reported that patient has a girlfriend who stays with him, but she has her own health issues, and she is not able to take care of patient at his current level of functioning.  Patient's son was in agreement to having patient go to SNF, CSW was [place].  Patient's son did not express any other questions or concerns.  William Lozano, MSW, Theresia MajorsLCSWA [phone]  05/11/2017 6:40 PM

## 2017-05-12 MED ORDER — DILTIAZEM HCL 30 MG PO TABS
30.0000 mg | ORAL_TABLET | Freq: Four times a day (QID) | ORAL | Status: DC
  Administered 2017-05-12 – 2017-05-14 (×9): 30 mg via ORAL
  Filled 2017-05-12 (×10): qty 1

## 2017-05-12 MED ORDER — AMIODARONE HCL 200 MG PO TABS
200.0000 mg | ORAL_TABLET | Freq: Once | ORAL | Status: AC
  Administered 2017-05-12: 200 mg via ORAL
  Filled 2017-05-12: qty 1

## 2017-05-12 MED ORDER — DIPHENOXYLATE-ATROPINE 2.5-0.025 MG PO TABS
1.0000 | ORAL_TABLET | Freq: Four times a day (QID) | ORAL | Status: DC | PRN
  Administered 2017-05-12: 1 via ORAL
  Filled 2017-05-12: qty 1

## 2017-05-12 MED ORDER — HALOPERIDOL LACTATE 5 MG/ML IJ SOLN: 1.0000 mg | Freq: Four times a day (QID) | INTRAMUSCULAR | Status: DC | PRN

## 2017-05-12 MED ORDER — RIVASTIGMINE TARTRATE 1.5 MG PO CAPS
3.0000 mg | ORAL_CAPSULE | Freq: Two times a day (BID) | ORAL | Status: DC
  Administered 2017-05-12 – 2017-05-17 (×11): 3 mg via ORAL
  Filled 2017-05-12: qty 2
  Filled 2017-05-12 (×2): qty 1
  Filled 2017-05-12: qty 2
  Filled 2017-05-12 (×5): qty 1
  Filled 2017-05-12: qty 2
  Filled 2017-05-12: qty 1

## 2017-05-12 MED ORDER — AMIODARONE HCL 200 MG PO TABS
400.0000 mg | ORAL_TABLET | Freq: Every day | ORAL | Status: DC
  Administered 2017-05-13 – 2017-05-14 (×2): 400 mg via ORAL
  Filled 2017-05-12 (×2): qty 2

## 2017-05-12 MED ORDER — QUETIAPINE FUMARATE 25 MG PO TABS
25.0000 mg | ORAL_TABLET | Freq: Every day | ORAL | Status: DC
  Administered 2017-05-12 – 2017-05-16 (×5): 25 mg via ORAL
  Filled 2017-05-12 (×5): qty 1

## 2017-05-12 NOTE — Progress Notes (Signed)
[facility] - [place] at [facility]   PATIENT NAME: William Lozano    MR#:  [id number]  DATE OF BIRTH:  12/08/1932  SUBJECTIVE:   Patient here due to altered mental status and encephalopathy.  Also noted to have atrial fibrillation with rapid ventricular response.  Mental status has improved since admission but continues to have significant delirium with sundowning.  Heart rates are stable when patient is not agitated.  Patient having some diarrhea today.  REVIEW OF SYSTEMS:    Review of Systems  Unable to perform ROS: Dementia    Nutrition: Dysphagia 3. Tolerating Diet: Yes Tolerating PT: Evaluation noted   DRUG ALLERGIES:   Allergies  Allergen Reactions  . Codeine Other (See Comments)    Causes side effects  . Penicillin G Rash    Has patient had a PCN reaction causing immediate rash, facial/tongue/throat swelling, SOB or lightheadedness with hypotension: Yes Has patient had a PCN reaction causing severe rash involving mucus membranes or skin necrosis: No Has patient had a PCN reaction that required hospitalization: No Has patient had a PCN reaction occurring within the last 10 years: No If all of the above answers are "NO", then may proceed with Cephalosporin use.  . Sulfa Antibiotics Rash    Pt and fam are saying he does not have allergy to sulfa now.    VITALS:  Blood pressure 129/70, pulse 98, temperature 97.9 F (36.6 C), temperature source Oral, resp. rate 18, height 6' (1.829 m), weight 67.9 kg (149 lb 11.1 oz), SpO2 98 %.  PHYSICAL EXAMINATION:   Physical Exam  GENERAL:  84 y.o.-year-old patient lying in bed encephalopathic/dementia.  EYES: Pupils equal, round, reactive to light and accommodation. No scleral icterus. Extraocular muscles intact.  HEENT: Head atraumatic, normocephalic. Oropharynx and nasopharynx clear.  Dry oral mucosa NECK:  Supple, no jugular venous distention. No thyroid enlargement, no tenderness.  LUNGS: Normal breath  sounds bilaterally, no wheezing, rales, rhonchi. No use of accessory muscles of respiration.  CARDIOVASCULAR: S1, S2 irregular. No murmurs, rubs, or gallops.  ABDOMEN: Soft, nontender, nondistended. Bowel sounds present. No organomegaly or mass.  EXTREMITIES: No cyanosis, clubbing or edema b/l.    NEUROLOGIC: Cranial nerves II through XII are intact. No focal Motor or sensory deficits b/l.  Globally weak PSYCHIATRIC: The patient is alert and oriented x 1.  SKIN: No obvious rash, lesion, or ulcer.    LABORATORY PANEL:   CBC Recent Labs  Lab 05/09/17 0501  WBC 13.8*  HGB 13.5  HCT 41.0  PLT 325   ------------------------------------------------------------------------------------------------------------------  Chemistries  Recent Labs  Lab 05/08/17 1211 05/08/17 1913 05/09/17 0501  NA 143  --  145  K 3.4*  --  3.7  CL 104  --  108  CO2 27  --  29  GLUCOSE 175*  --  116*  BUN 24*  --  22*  CREATININE 1.05  --  1.03  CALCIUM 9.3  --  9.0  MG  --  1.9  --   AST 46*  --   --   ALT 14*  --   --   ALKPHOS 79  --   --   BILITOT 1.5*  --   --    ------------------------------------------------------------------------------------------------------------------  Cardiac Enzymes Recent Labs  Lab 05/09/17 0924  TROPONINI <0.03   ------------------------------------------------------------------------------------------------------------------  RADIOLOGY:  No results found.   ASSESSMENT AND PLAN:   84 year old male with past medical history of dementia, hypertension, chronic atrial fibrillation, GERD, history of kidney  stones, hyperlipidemia, osteoarthritis who presented to the hospital due to altered mental status/encephalopathy.  1.  Acute encephalopathy with underlying dementia- secondary to dehydration recent fall and progressive decline. -There was no evidence of acute infective infection.  Patient's mental status has since then improved. - Patient is more alert but  does have significant sundowning and agitation.  Avoid benzodiazepines, continue Haldol as needed for agitation.  Will start on some low-dose Seroquel.  Hold other psychotropic drugs for now.  2.  Atrial fibrillation with rapid ventricular response-patient was a amiodarone drip but not that has been weaned off.  Patient still having variable rates with agitation and exertion. -Continue amiodarone but will raise dose, continue metoprolol, will add some low-dose Cardizem.  Follow heart rate. -Appreciate cardiology input, echocardiogram showing ejection fraction of 45-50%.  Patient not a candidate for anticoagulation given his high fall risk.  3.  Essential hypertension- continue metoprolol, Cardizem.  4.  History of seizures-continue Keppra.  5.  Dementia- will resume patient's Exelon, continue Seroquel at bedtime.  Continue Restoril.  6.  Diarrhea-continue Lomotil.  7.  GERD-continue Protonix.  Await physical therapy evaluation but patient will likely need higher level of care to skilled nursing facility.  All the records are reviewed and case discussed with Care Management/Social Worker. Management plans discussed with the patient, family and they are in agreement.  CODE STATUS: DNR  DVT Prophylaxis: Lovenox  TOTAL TIME TAKING CARE OF THIS PATIENT: 30 minutes.   POSSIBLE D/C IN 2-3 DAYS, DEPENDING ON CLINICAL CONDITION.   Houston Siren M.D on 05/12/2017 at 3:46 PM  Between 7am to 6pm - Pager - [phone]  After 6pm go to [email or web address] - Social research officer, government  [facility] [place] [facility]  Office  [phone]  CC: Primary care physician; Marguarite Arbour, MD

## 2017-05-12 NOTE — Progress Notes (Signed)
  Speech Language Pathology Treatment: Dysphagia  Patient Details Name: William PigeonJames B Lampkins Sr. MRN: [id number] DOB: 11/16/1932 Today's Date: 05/12/2017 Time: [id number] SLP Time Calculation (min) (ACUTE ONLY): 44 min  Assessment / Plan / Recommendation Clinical Impression  Pt observed with 6 ounces of thin liquid by cup. Pt took about 12 sips, independently small. Pt needed support in holding cup. Pt tolerated thin liquids without overt s/s of aspiration except for last sip towards the bottom of the cup. Pt demonstrated immediate overt cough. This sign of aspiration was not observed with any other sips and has not been reported during meals since evaluation. I believe this incident was a one time occurrence because pt had tilted head back slightly to drink the liquid towards the bottom of the cup. Reinforced aspiration precautions and small sips, limiting distractions. Recommend continue with mechanical soft diet w/thin liquids and aspiration precautions. Will f/u in 1-2 days. If pt demonstrates increased difficulty with thin consistency may need to consider downgrade to nectar thick liquids.   HPI HPI: William Lozano  is a 84 y.o. male with a known history of atrial fibrillation, osteoarthritis, dementia, essential hypertension, GERD, Recurrent kidney stones and a previous history of stroke who is brought by his family due to progressive confusion and agitation.  Patient's had a fall last month and according to his family friend and his brother who is here state that he is gotten worse since then.  Patient has not had much pain however has had difficulty with ambulation.  He has not been eating or drinking much.  In the emergency room he was noticed to have A. fib with RVR.  He appears very dehydrated.  He also has leukocytosis however there is no evidence of infection.  Patient is restless trying to get out of bed. Pt's sister and nsg report that pt has been tolerating diet well with no straws.       SLP  Plan  Continue with current plan of care       Recommendations  Diet recommendations: Dysphagia 3 (mechanical soft);Thin liquid Liquids provided via: Cup Medication Administration: Crushed with puree Supervision: Staff to assist with self feeding Compensations: Minimize environmental distractions;Slow rate;Small sips/bites Postural Changes and/or Swallow Maneuvers: Seated upright 90 degrees                Oral Care Recommendations: Oral care BID;Staff/trained caregiver to provide oral care Follow up Recommendations: Skilled Nursing facility SLP Visit Diagnosis: Dysphagia, oropharyngeal phase (R13.12) Plan: Continue with current plan of care       GO                Camano,Kate Sweetman 05/12/2017, 11:34 AM

## 2017-05-12 NOTE — Progress Notes (Signed)
Physical Therapy Treatment Patient Details Name: William Lozano. MRN: [id number] DOB: 01/04/1933 Today's Date: 05/12/2017    History of Present Illness Pt admitted for Afib. Pt with elevated HR this admission, currently on cardizem drip with HR under control. History includes dementia, HTN, GERD, and CVA on 06/2016. Pt is poor historian and most of history obtained from CSW who spoke to son. Per son, pt with fast decline in past 2 weeks including falls and increased AMS.     PT Comments    Pt in bed.  Nursing and family reports general restlessness needing redirection to stay in bed.  Pt was able to get to edge of bed with min assist.  Unable to sit fully upright without assist.  He was able to stand with min a x 2 today.  Initially started with marching in place.  LLE with minor buckling noted but once a few steps he was able to take several small shuffling steps to recliner at bedside.  Participated in exercises as described below.  Seemed generally fatigued with activity but remained up in recliner with alarm set and family in room. SNF remains appropriate for discharge.   Follow Up Recommendations  SNF     Equipment Recommendations  None recommended by PT    Recommendations for Other Services       Precautions / Restrictions Precautions Precautions: Fall Restrictions Weight Bearing Restrictions: No    Mobility  Bed Mobility Overal bed mobility: Needs Assistance Bed Mobility: Supine to Sit     Supine to sit: Min assist        Transfers Overall transfer level: Needs assistance Equipment used: Rolling walker (2 wheeled) Transfers: Sit to/from Stand Sit to Stand: +2 physical assistance;Min assist            Ambulation/Gait Ambulation/Gait assistance: Min assist;+2 physical assistance Ambulation Distance (Feet): 3 Feet Assistive device: Rolling walker (2 wheeled) Gait Pattern/deviations: Step-through pattern;Decreased step length - right;Decreased step length -  left;Shuffle         Stairs            Wheelchair Mobility    Modified Rankin (Stroke Patients Only)       Balance Overall balance assessment: Needs assistance;History of Falls Sitting-balance support: Feet supported;Bilateral upper extremity supported Sitting balance-Leahy Scale: Fair     Standing balance support: Bilateral upper extremity supported Standing balance-Leahy Scale: Poor                              Cognition Arousal/Alertness: Awake/alert Behavior During Therapy: WFL for tasks assessed/performed;Restless Overall Cognitive Status: Impaired/Different from baseline                                        Exercises Other Exercises Other Exercises: seated A/AAROM for ankle pumps, LAW, marches ab/add x 10    General Comments        Pertinent Vitals/Pain Pain Assessment: No/denies pain    Home Living                      Prior Function            PT Goals (current goals can now be found in the care plan section) Progress towards PT goals: Progressing toward goals    Frequency  PT Plan Current plan remains appropriate    Co-evaluation              AM-PAC PT "6 Clicks" Daily Activity  Outcome Measure  Difficulty turning over in bed (including adjusting bedclothes, sheets and blankets)?: Unable Difficulty moving from lying on back to sitting on the side of the bed? : Unable Difficulty sitting down on and standing up from a chair with arms (e.g., wheelchair, bedside commode, etc,.)?: Unable Help needed moving to and from a bed to chair (including a wheelchair)?: A Lot Help needed walking in hospital room?: A Lot Help needed climbing 3-5 steps with a railing? : Total 6 Click Score: 8    End of Session Equipment Utilized During Treatment: Gait belt   Patient left: in chair;with chair alarm set;with call bell/phone within reach;with family/visitor present Nurse Communication:  Mobility status       Time: 7829-5621 PT Time Calculation (min) (ACUTE ONLY): 12 min  Charges:  $Therapeutic Exercise: 8-22 mins                    G Codes:     Danielle Dess, PTA 2017/05/29, 2:43 PM

## 2017-05-12 NOTE — Clinical Social Work Note (Signed)
The CSW met with the patient, his brother Jenny Reichmann and friends Steward Drone and Jeanett Schlein at bedside to discuss current bed offers ([facility], and [facility]). The plan is for the family to discuss once the patient's son Merry Proud arrives from out of town this evening. The CSW is continuing to follow for discharge planning.  Santiago Bumpers, MSW, Latanya Presser [phone]

## 2017-05-13 LAB — LEVETIRACETAM LEVEL: Levetiracetam Lvl: 5 ug/mL — ABNORMAL LOW (ref 10.0–40.0)

## 2017-05-13 NOTE — Progress Notes (Signed)
[facility] - [place] at [facility]   PATIENT NAME: Phyllis Whitefield    MR#:  [id number]  DATE OF BIRTH:  05/27/1932  SUBJECTIVE:   Patient is a bit lethargic this morning. Family at bedside. Patient awaiting short-term rehab placement. No acute events overnight.  REVIEW OF SYSTEMS:    Review of Systems  Unable to perform ROS: Dementia    Nutrition: Dysphagia 3. Tolerating Diet: Yes but poor Tolerating PT: Evaluation noted   DRUG ALLERGIES:   Allergies  Allergen Reactions  . Codeine Other (See Comments)    Causes side effects  . Penicillin G Rash    Has patient had a PCN reaction causing immediate rash, facial/tongue/throat swelling, SOB or lightheadedness with hypotension: Yes Has patient had a PCN reaction causing severe rash involving mucus membranes or skin necrosis: No Has patient had a PCN reaction that required hospitalization: No Has patient had a PCN reaction occurring within the last 10 years: No If all of the above answers are "NO", then may proceed with Cephalosporin use.  . Sulfa Antibiotics Rash    Pt and fam are saying he does not have allergy to sulfa now.    VITALS:  Blood pressure 131/88, pulse 60, temperature 98 F (36.7 C), temperature source Oral, resp. rate 20, height 6' (1.829 m), weight 67.9 kg (149 lb 11.1 oz), SpO2 96 %.  PHYSICAL EXAMINATION:   Physical Exam  GENERAL:  84 y.o.-year-old patient lying in bed encephalopathic and lethargic EYES: Pupils equal, round, reactive to light and accommodation. No scleral icterus. Extraocular muscles intact.  HEENT: Head atraumatic, normocephalic. Oropharynx and nasopharynx clear.  Dry oral mucosa NECK:  Supple, no jugular venous distention. No thyroid enlargement, no tenderness.  LUNGS: Normal breath sounds bilaterally, no wheezing, rales, rhonchi. No use of accessory muscles of respiration.  CARDIOVASCULAR: S1, S2 irregular. No murmurs, rubs, or gallops.  ABDOMEN: Soft, nontender,  nondistended. Bowel sounds present. No organomegaly or mass.  EXTREMITIES: No cyanosis, clubbing or edema b/l.    NEUROLOGIC: Cranial nerves II through XII are intact. No focal Motor or sensory deficits b/l.  Globally weak PSYCHIATRIC: The patient is alert and oriented x 1.  SKIN: No obvious rash, lesion, or ulcer.    LABORATORY PANEL:   CBC Recent Labs  Lab 05/09/17 0501  WBC 13.8*  HGB 13.5  HCT 41.0  PLT 325   ------------------------------------------------------------------------------------------------------------------  Chemistries  Recent Labs  Lab 05/08/17 1211 05/08/17 1913 05/09/17 0501  NA 143  --  145  K 3.4*  --  3.7  CL 104  --  108  CO2 27  --  29  GLUCOSE 175*  --  116*  BUN 24*  --  22*  CREATININE 1.05  --  1.03  CALCIUM 9.3  --  9.0  MG  --  1.9  --   AST 46*  --   --   ALT 14*  --   --   ALKPHOS 79  --   --   BILITOT 1.5*  --   --    ------------------------------------------------------------------------------------------------------------------  Cardiac Enzymes Recent Labs  Lab 05/09/17 0924  TROPONINI <0.03   ------------------------------------------------------------------------------------------------------------------  RADIOLOGY:  No results found.   ASSESSMENT AND PLAN:   84 year old male with past medical history of dementia, hypertension, chronic atrial fibrillation, GERD, history of kidney stones, hyperlipidemia, osteoarthritis who presented to the hospital due to altered mental status/encephalopathy.  1.  Acute encephalopathy with underlying dementia- secondary to dehydration recent fall and progressive decline. -  There was no evidence of acute infective infection.  Patient's mental status has since then improved. - Patient is more alert but does have significant sundowning and agitation.  Avoid benzodiazepines, continue Haldol as needed for agitation.  Cont. Low dose seroquel  Hold other psychotropic drugs for now. - avoid  Benzo's. Keep awake during day and sleep at night if possible.   2.  Atrial fibrillation with rapid ventricular response-patient was on an amiodarone drip but  that has been weaned off.  Patient still having variable rates with agitation and exertion. -Continue amiodarone , continue metoprolol, Cardizem and HR improved.  -Appreciate cardiology input, echocardiogram showing ejection fraction of 45-50%.  Patient not a candidate for anticoagulation given his high fall risk.  3.  Essential hypertension- continue metoprolol, Cardizem.  4.  History of seizures-continue Keppra.  5.  Dementia- cont. Exelon, continue Seroquel at bedtime.  Continue Restoril.  6.  Diarrhea-continue Lomotil.  7.  GERD-continue Protonix.  Awaiting SNF, STR Placement.   All the records are reviewed and case discussed with Care Management/Social Worker. Management plans discussed with the patient, family and they are in agreement.  CODE STATUS: DNR  DVT Prophylaxis: Lovenox  TOTAL TIME TAKING CARE OF THIS PATIENT: 25 minutes.   POSSIBLE D/C IN 2-3 DAYS, DEPENDING ON CLINICAL CONDITION.   Houston SirenSAINANI,VIVEK J M.D on 05/13/2017 at 3:10 PM  Between 7am to 6pm - Pager - [phone]  After 6pm go to [email or web address] - Social research officer, governmentpassword EPAS [facility]  [facility] [place] [facility]  Office  [phone]  CC: Primary care physician; Marguarite ArbourSparks, Jeffrey D, MD

## 2017-05-13 NOTE — Progress Notes (Signed)
Pt has been resting well today. No sign of agitation, no complaining of pain and was taking medications okay. Did eat couple of bites for breakfast but refused lunch. Will continue to monitor.

## 2017-05-13 NOTE — Plan of Care (Signed)
  Problem: Clinical Measurements: Goal: Ability to maintain clinical measurements within normal limits will improve Outcome: Progressing Goal: Will remain free from infection Outcome: Progressing Goal: Cardiovascular complication will be avoided Outcome: Progressing   Problem: Coping: Goal: Level of anxiety will decrease Outcome: Progressing   Problem: Pain Managment: Goal: General experience of comfort will improve Outcome: Progressing   Problem: Safety: Goal: Ability to remain free from injury will improve Outcome: Progressing   

## 2017-05-14 LAB — CBC
HCT: 42.6 % (ref 40.0–52.0)
Hemoglobin: 14 g/dL (ref 13.0–18.0)
MCH: 30.4 pg (ref 26.0–34.0)
MCHC: 32.9 g/dL (ref 32.0–36.0)
MCV: 92.2 fL (ref 80.0–100.0)
Platelets: 345 10*3/uL (ref 150–440)
RBC: 4.62 MIL/uL (ref 4.40–5.90)
RDW: 14.9 % — AB (ref 11.5–14.5)
WBC: 18.7 10*3/uL — ABNORMAL HIGH (ref 3.8–10.6)

## 2017-05-14 LAB — BASIC METABOLIC PANEL
Anion gap: 8 (ref 5–15)
BUN: 20 mg/dL (ref 6–20)
CO2: 24 mmol/L (ref 22–32)
Calcium: 8.8 mg/dL — ABNORMAL LOW (ref 8.9–10.3)
Chloride: 112 mmol/L — ABNORMAL HIGH (ref 101–111)
Creatinine, Ser: 0.95 mg/dL (ref 0.61–1.24)
GFR calc non Af Amer: >60 mL/min (ref >60)
Glucose, Bld: 286 mg/dL — ABNORMAL HIGH (ref 65–99)
Potassium: 2.9 mmol/L — ABNORMAL LOW (ref 3.5–5.1)
SODIUM: 144 mmol/L (ref 135–145)

## 2017-05-14 LAB — MAGNESIUM: MAGNESIUM: 1.9 mg/dL (ref 1.7–2.4)

## 2017-05-14 MED ORDER — DILTIAZEM HCL ER COATED BEADS 180 MG PO CP24
180.0000 mg | ORAL_CAPSULE | Freq: Every day | ORAL | Status: DC
  Administered 2017-05-15: 180 mg via ORAL
  Filled 2017-05-14 (×2): qty 1

## 2017-05-14 MED ORDER — DILTIAZEM HCL 30 MG PO TABS
30.0000 mg | ORAL_TABLET | Freq: Four times a day (QID) | ORAL | Status: AC
  Administered 2017-05-14 (×2): 30 mg via ORAL
  Filled 2017-05-14 (×2): qty 1

## 2017-05-14 MED ORDER — SODIUM CHLORIDE 0.9% FLUSH: 3.0000 mL | INTRAVENOUS | Status: DC | PRN

## 2017-05-14 MED ORDER — SODIUM CHLORIDE 0.9% FLUSH
3.0000 mL | Freq: Two times a day (BID) | INTRAVENOUS | Status: DC
  Administered 2017-05-14 – 2017-05-17 (×7): 3 mL via INTRAVENOUS

## 2017-05-14 MED ORDER — POTASSIUM CHLORIDE CRYS ER 20 MEQ PO TBCR
20.0000 meq | EXTENDED_RELEASE_TABLET | Freq: Two times a day (BID) | ORAL | Status: AC
  Administered 2017-05-14 – 2017-05-16 (×4): 20 meq via ORAL
  Filled 2017-05-14 (×4): qty 1

## 2017-05-14 MED ORDER — SODIUM CHLORIDE 0.9% FLUSH
3.0000 mL | Freq: Two times a day (BID) | INTRAVENOUS | Status: DC
  Administered 2017-05-14 – 2017-05-17 (×2): 3 mL via INTRAVENOUS

## 2017-05-14 MED ORDER — POTASSIUM CHLORIDE CRYS ER 20 MEQ PO TBCR
40.0000 meq | EXTENDED_RELEASE_TABLET | Freq: Once | ORAL | Status: AC
  Administered 2017-05-14: 40 meq via ORAL
  Filled 2017-05-14: qty 2

## 2017-05-14 NOTE — Progress Notes (Signed)
Physical Therapy Treatment Patient Details Name: William PigeonJames B Dannenberg Sr. MRN: [id number] DOB: 02/26/1932 Today's Date: 05/14/2017    History of Present Illness Pt admitted for Afib. Pt with elevated HR this admission, currently on cardizem drip with HR under control. History includes dementia, HTN, GERD, and CVA on 06/2016. Pt is poor historian and most of history obtained from CSW who spoke to son. Per son, pt with fast decline in past 2 weeks including falls and increased AMS.     PT Comments    Pt is making limited progress with therapy. Improved ability with following commands and performing there-ex however still limited by strength/balance. Pt calm this date and cooperative. Less family in room compared to previous session, pt able to focus on therapy. Will continue to progress.  Follow Up Recommendations  SNF     Equipment Recommendations  None recommended by PT    Recommendations for Other Services       Precautions / Restrictions Precautions Precautions: Fall Restrictions Weight Bearing Restrictions: No    Mobility  Bed Mobility               General bed mobility comments: pt in recliner upon arrival, not tested  Transfers Overall transfer level: Needs assistance Equipment used: Rolling walker (2 wheeled) Transfers: Sit to/from Stand Sit to Stand: Max assist         General transfer comment: 2 attempts made with RW. Unable to fully clear buttocks off chair. Better able to follow commands this date  Ambulation/Gait             General Gait Details: unable at this time   Stairs            Wheelchair Mobility    Modified Rankin (Stroke Patients Only)       Balance                                            Cognition Arousal/Alertness: Awake/alert Behavior During Therapy: WFL for tasks assessed/performed;Restless Overall Cognitive Status: Impaired/Different from baseline                                         Exercises Other Exercises Other Exercises: Seated ther-ex performed on B LE including LAQ, alt marching, SLRs, and hip abd/add. All ther-ex performed x 10 reps on B LE with mod assist for performance. Improved ability to follow commands Other Exercises: Seated balance activites performed including reaching for glove with B UE in all direction to promote ant. weight shift. This weight shift translated into sit<>Stand attempts using RW, however unable to manage secondary to B LE weakness.    General Comments        Pertinent Vitals/Pain Pain Assessment: No/denies pain    Home Living                      Prior Function            PT Goals (current goals can now be found in the care plan section) Acute Rehab PT Goals Patient Stated Goal: unable to state PT Goal Formulation: Patient unable to participate in goal setting Time For Goal Achievement: 05/25/17 Potential to Achieve Goals: Fair Progress towards PT goals: Progressing toward goals    Frequency  Min 2X/week      PT Plan Current plan remains appropriate    Co-evaluation              AM-PAC PT "6 Clicks" Daily Activity  Outcome Measure  Difficulty turning over in bed (including adjusting bedclothes, sheets and blankets)?: Unable Difficulty moving from lying on back to sitting on the side of the bed? : Unable Difficulty sitting down on and standing up from a chair with arms (e.g., wheelchair, bedside commode, etc,.)?: Unable Help needed moving to and from a bed to chair (including a wheelchair)?: A Lot Help needed walking in hospital room?: A Lot Help needed climbing 3-5 steps with a railing? : Total 6 Click Score: 8    End of Session Equipment Utilized During Treatment: Gait belt Activity Tolerance: Patient tolerated treatment well Patient left: in chair;with chair alarm set;with nursing/sitter in room Nurse Communication: Mobility status PT Visit Diagnosis: Muscle weakness (generalized)  (M62.81);Difficulty in walking, not elsewhere classified (R26.2)     Time: 1335-1400 PT Time Calculation (min) (ACUTE ONLY): 25 min  Charges:  $Therapeutic Exercise: 8-22 mins $Therapeutic Activity: 8-22 mins                    G Codes:       [name], PT, DPT [phone]    Alaisha Eversley 05/14/2017, 2:44 PM

## 2017-05-14 NOTE — Progress Notes (Signed)
Transferred patient from bed to chair.  He was not able to bear weight on his legs continually.  He kept "bobbing".  He was only able to move his feet in tiny shuffles.   It took 2 staff members holding him and the gate belt to move him.

## 2017-05-14 NOTE — Care Management Important Message (Signed)
Important Message  Patient Details  Name: William PigeonJames B Dicke Sr. MRN: [id number] Date of Birth: 07/03/1932   Medicare Important Message Given:  Yes  Signed IM notice given  [name], Jamica Woodyard R, RN 05/14/2017, 3:26 PM

## 2017-05-14 NOTE — Progress Notes (Signed)
[employer] - [facility] at [facility]   PATIENT NAME: William Lozano    MR#:  [id number]  DATE OF BIRTH:  04/26/1932  SUBJECTIVE:   No acute events overnight.  Patient's remains lethargic and sleepy.  Patient's son is at bedside.  P.o. intake remains marginal.  REVIEW OF SYSTEMS:    Review of Systems  Unable to perform ROS: Dementia    Nutrition: Dysphagia 3. Tolerating Diet: Yes but poor Tolerating PT: Evaluation noted   DRUG ALLERGIES:   Allergies  Allergen Reactions  . Codeine Other (See Comments)    Causes side effects  . Penicillin G Rash    Has patient had a PCN reaction causing immediate rash, facial/tongue/throat swelling, SOB or lightheadedness with hypotension: Yes Has patient had a PCN reaction causing severe rash involving mucus membranes or skin necrosis: No Has patient had a PCN reaction that required hospitalization: No Has patient had a PCN reaction occurring within the last 10 years: No If all of the above answers are "NO", then may proceed with Cephalosporin use.  . Sulfa Antibiotics Rash    Pt and fam are saying he does not have allergy to sulfa now.    VITALS:  Blood pressure 119/66, pulse 94, temperature 98.2 F (36.8 C), temperature source Oral, resp. rate 18, height 6' (1.829 m), weight 67.9 kg (149 lb 11.1 oz), SpO2 93 %.  PHYSICAL EXAMINATION:   Physical Exam  GENERAL:  84 y.o.-year-old patient lying in bed encephalopathic and lethargic but follows simple commands.  EYES: Pupils equal, round, reactive to light and accommodation. No scleral icterus. Extraocular muscles intact.  HEENT: Head atraumatic, normocephalic. Oropharynx and nasopharynx clear.  Dry oral mucosa NECK:  Supple, no jugular venous distention. No thyroid enlargement, no tenderness.  LUNGS: Normal breath sounds bilaterally, no wheezing, rales, rhonchi. No use of accessory muscles of respiration.  CARDIOVASCULAR: S1, S2 irregular. No murmurs, rubs, or gallops.   ABDOMEN: Soft, nontender, nondistended. Bowel sounds present. No organomegaly or mass.  EXTREMITIES: No cyanosis, clubbing or edema b/l.    NEUROLOGIC: Cranial nerves II through XII are intact. No focal Motor or sensory deficits b/l.  Globally weak PSYCHIATRIC: The patient is alert and oriented x 1.  SKIN: No obvious rash, lesion, or ulcer.    LABORATORY PANEL:   CBC Recent Labs  Lab 05/14/17 1058  WBC 18.7*  HGB 14.0  HCT 42.6  PLT 345   ------------------------------------------------------------------------------------------------------------------  Chemistries  Recent Labs  Lab 05/08/17 1211 05/08/17 1913  05/14/17 1058  NA 143  --    < > 144  K 3.4*  --    < > 2.9*  CL 104  --    < > 112*  CO2 27  --    < > 24  GLUCOSE 175*  --    < > 286*  BUN 24*  --    < > 20  CREATININE 1.05  --    < > 0.95  CALCIUM 9.3  --    < > 8.8*  MG  --  1.9  --   --   AST 46*  --   --   --   ALT 14*  --   --   --   ALKPHOS 79  --   --   --   BILITOT 1.5*  --   --   --    < > = values in this interval not displayed.   ------------------------------------------------------------------------------------------------------------------  Cardiac Enzymes Recent Labs  Lab  05/09/17 0924  TROPONINI <0.03   ------------------------------------------------------------------------------------------------------------------  RADIOLOGY:  No results found.   ASSESSMENT AND PLAN:   84 year old male with past medical history of dementia, hypertension, chronic atrial fibrillation, GERD, history of kidney stones, hyperlipidemia, osteoarthritis who presented to the hospital due to altered mental status/encephalopathy.  1.  Acute encephalopathy with underlying dementia- secondary to dehydration recent fall and progressive decline. -There is no evidence of acute infective infection.  Patient's mental status has since then improved. - Patient is more alert but does have significant sundowning and  agitation.  Avoid benzodiazepines, continue Haldol as needed for agitation.  Cont. Low dose seroquel  Hold other psychotropic drugs for now. - avoid Benzo's. Keep awake during day and sleep at night if possible.   2.  Atrial fibrillation with rapid ventricular response - Patient still having variable rates with agitation and exertion. - cont. metoprolol, Cardizem for rate control. -Appreciate cardiology input, echocardiogram showing ejection fraction of 45-50%.  Patient not a candidate for anticoagulation given his high fall risk.  3.  Essential hypertension- continue metoprolol, Cardizem.  4.  History of seizures-continue Keppra.  5.  Dementia- cont. Exelon, continue Seroquel at bedtime.  Continue Restoril.  6.  Diarrhea-continue Lomotil.  7.  GERD-continue Protonix.  8.  Nutrition-patient's p.o. intake has been poor and will do calorie count.  Encourage p.o. intake.  Continue nutritional supplements.  9.  Hypokalemia-we will place the patient on oral potassium supplements.  Repeat level in the morning.  Check magnesium level.  Had an extensive discussion with the patient's son about his prognosis and plan of care.  He was in agreement with doing calorie count.  All the records are reviewed and case discussed with Care Management/Social Worker. Management plans discussed with the patient, family and they are in agreement.  CODE STATUS: DNR  DVT Prophylaxis: Lovenox  TOTAL TIME TAKING CARE OF THIS PATIENT: 35 minutes.   POSSIBLE D/C IN 2-3 DAYS, DEPENDING ON CLINICAL CONDITION.   Houston SirenSAINANI,VIVEK J M.D on 05/14/2017 at 3:37 PM  Between 7am to 6pm - Pager - [phone]  After 6pm go to [email or web address] - Social research officer, governmentpassword EPAS [facility]  [facility] [place] [facility]  Office  [phone]  CC: Primary care physician; Marguarite ArbourSparks, Jeffrey D, MD

## 2017-05-15 ENCOUNTER — Ambulatory Visit: Admission: RE | Admit: 2017-05-15 | Payer: Medicare Other | Source: Ambulatory Visit

## 2017-05-15 ENCOUNTER — Inpatient Hospital Stay: Payer: Medicare Other

## 2017-05-15 DIAGNOSIS — E46 Unspecified protein-calorie malnutrition: Secondary | ICD-10-CM

## 2017-05-15 DIAGNOSIS — Z515 Encounter for palliative care: Secondary | ICD-10-CM

## 2017-05-15 DIAGNOSIS — Z7189 Other specified counseling: Secondary | ICD-10-CM

## 2017-05-15 DIAGNOSIS — F0391 Unspecified dementia with behavioral disturbance: Secondary | ICD-10-CM

## 2017-05-15 DIAGNOSIS — F03918 Unspecified dementia, unspecified severity, with other behavioral disturbance: Secondary | ICD-10-CM

## 2017-05-15 LAB — CBC
HEMATOCRIT: 45.4 % (ref 40.0–52.0)
HEMOGLOBIN: 14.9 g/dL (ref 13.0–18.0)
MCH: 30.5 pg (ref 26.0–34.0)
MCHC: 32.8 g/dL (ref 32.0–36.0)
MCV: 92.9 fL (ref 80.0–100.0)
Platelets: 349 10*3/uL (ref 150–440)
RBC: 4.88 MIL/uL (ref 4.40–5.90)
RDW: 14.9 % — AB (ref 11.5–14.5)
WBC: 18.4 10*3/uL — ABNORMAL HIGH (ref 3.8–10.6)

## 2017-05-15 LAB — BASIC METABOLIC PANEL
ANION GAP: 9 (ref 5–15)
BUN: 21 mg/dL — ABNORMAL HIGH (ref 6–20)
CALCIUM: 8.9 mg/dL (ref 8.9–10.3)
CHLORIDE: 111 mmol/L (ref 101–111)
CO2: 26 mmol/L (ref 22–32)
Creatinine, Ser: 0.98 mg/dL (ref 0.61–1.24)
GFR calc Af Amer: >60 mL/min (ref >60)
GFR calc non Af Amer: >60 mL/min (ref >60)
GLUCOSE: 148 mg/dL — AB (ref 65–99)
Potassium: 3.6 mmol/L (ref 3.5–5.1)
Sodium: 146 mmol/L — ABNORMAL HIGH (ref 135–145)

## 2017-05-15 MED ORDER — PRO-STAT SUGAR FREE PO LIQD
30.0000 mL | Freq: Two times a day (BID) | ORAL | Status: DC
  Administered 2017-05-15 (×2): 30 mL via ORAL

## 2017-05-15 MED ORDER — DRONABINOL 2.5 MG PO CAPS
2.5000 mg | ORAL_CAPSULE | Freq: Two times a day (BID) | ORAL | Status: DC
  Administered 2017-05-15 – 2017-05-16 (×2): 2.5 mg via ORAL
  Filled 2017-05-15 (×3): qty 1

## 2017-05-15 MED ORDER — UNJURY CHICKEN SOUP POWDER
8.0000 [oz_av] | Freq: Three times a day (TID) | ORAL | Status: DC
  Administered 2017-05-15: 8 [oz_av] via ORAL

## 2017-05-15 MED ORDER — METOPROLOL SUCCINATE ER 50 MG PO TB24
50.0000 mg | ORAL_TABLET | Freq: Once | ORAL | Status: AC
  Administered 2017-05-15: 50 mg via ORAL
  Filled 2017-05-15: qty 1

## 2017-05-15 MED ORDER — UNJURY CHICKEN SOUP POWDER
2.0000 [oz_av] | Freq: Three times a day (TID) | ORAL | Status: DC
  Administered 2017-05-15: 2 [oz_av] via ORAL

## 2017-05-15 MED ORDER — METOPROLOL SUCCINATE ER 100 MG PO TB24
100.0000 mg | ORAL_TABLET | Freq: Every day | ORAL | Status: DC
  Administered 2017-05-16 – 2017-05-17 (×2): 100 mg via ORAL
  Filled 2017-05-15 (×2): qty 1

## 2017-05-15 MED ORDER — FUROSEMIDE 10 MG/ML IJ SOLN
20.0000 mg | Freq: Once | INTRAMUSCULAR | Status: AC
  Administered 2017-05-15: 20 mg via INTRAVENOUS
  Filled 2017-05-15: qty 2

## 2017-05-15 NOTE — Progress Notes (Signed)
I mixed the prostat with ginger ale.  He drank it slowly but he drank all of it.  We're trying unjury soup now.

## 2017-05-15 NOTE — Progress Notes (Signed)
[facility] and two family members. Patient's wife was recalling how they traveled and how [place] going [place]. Patient was trying to finish his glass of ginger ale that his wife was spoon feeding him. Patient attempted to talk. CH offered ministry of presence, emotional support, and prayer. Patient's family stated they were Anselmo RodLutheran and that they were use to [employer] chaplains.

## 2017-05-15 NOTE — Progress Notes (Signed)
New [facility] for [facility] received from Solectron CorporationCSW Eric Anterhaus. Patient information faxed to referral. Possible discharge tomorrow. Patient information faxed to referral. Dayna BarkerKaren Robertson RN, BSN, [facility] [place], [facility] L[facility] [phone]

## 2017-05-15 NOTE — Progress Notes (Signed)
During a speech follow up, patient began coughing up mucous, sounds like it's bubbling.  02 Sat dropped to 86 on room/air..  Started on O2 2L [place].  RR 24, but this is his baseline.  BP stable.  Heart rate 105, which is improved from 125. Ax t is 99.8  Right lungs clear.  Left lungs has rhonchi.  Dr. Cherlynn KaiserSainani notified.  Ordered one time NTS and stat x-ray.  Right lung clear.  Left lung has rhonchi After suctioning RT thinks the mucous may be coming from from his esophagus. CXR pending

## 2017-05-15 NOTE — Progress Notes (Signed)
Dr. Cherlynn KaiserSainani notified that patient's chest X-ray has resulted. He gave order for 20 mg IV lasix for one dose.

## 2017-05-15 NOTE — Progress Notes (Signed)
Daily Progress Note   Patient Name: William NORFLEET Sr.       Date: 05/15/2017 DOB: 17-May-1932  Age: 84 y.o. MRN#: [id number] Attending Physician: Houston Siren, MD Primary Care Physician: Marguarite Arbour, MD Admit Date: 05/08/2017  Reason for Consultation/Follow-up: Establishing goals of care  Subjective: Patient awake and alert but disoriented with baseline dementia. Taking few bites of lunch for friend at bedside.  Introduced myself to William Lozano at bedside who tells me she is his "companion." After William Lozano and William Lozano died (they were friends throughout life) William Lozano has looked after William Lozano. 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 Diagnosed with dementia about two years ago. William Lozano is familiar with the trajectory and has observed decline. She understands no source of infection was found and that his symptoms are contributed to progressive dementia. I asked William Lozano about the patient's sons. She tells me they "have never been around the sick" and don't have a good understanding of his decline and poor prognosis. She confirms that the son's have decided against artificial nutrition--for which she agrees with. "He wouldn't want this." Also speaking of his wishes against resuscitation/life support.   Briefly discussed hospice services with William Lozano. She says this has not been mentioned. William Lozano tells me they are planning for "21 days" at rehab facility and will go from there. I explained that palliative services will follow at SNF and this can transition to hospice services in the near future if sons are agreeable. William Lozano seems very realistic of dementia trajectory and concern for recurrent hospitalization secondary to dehydration, malnutrition, or infection.   Length of Stay:  7  Current Medications: Scheduled Meds:  . aspirin EC  81 mg Oral Daily  . diltiazem  180 mg Oral Daily  . enoxaparin (LOVENOX) injection  40 mg Subcutaneous Q24H  . feeding supplement (ENSURE ENLIVE)  237 mL Oral BID BM  . feeding supplement (PRO-STAT SUGAR FREE 64)  30 mL Oral BID  . levETIRAcetam  500 mg Oral QHS  . [START ON 05/16/2017] metoprolol succinate  100 mg Oral Daily  . multivitamin with minerals  1 tablet Oral Daily  . pantoprazole  40 mg Oral QAC breakfast  . potassium chloride  20 mEq Oral BID  . protein supplement  2 oz Oral TID  .  QUEtiapine  25 mg Oral QHS  . rivastigmine  3 mg Oral BID WC  . simvastatin  20 mg Oral Daily  . sodium chloride flush  3 mL Intravenous Q12H  . sodium chloride flush  3 mL Intravenous Q12H    Continuous Infusions:   PRN Meds: acetaminophen **OR** acetaminophen, diphenoxylate-atropine, haloperidol lactate, ondansetron **OR** ondansetron (ZOFRAN) IV, sodium chloride flush, temazepam  Physical Exam  Constitutional: He appears ill.  HENT:  Head: Normocephalic and atraumatic.  Pulmonary/Chest: No accessory muscle usage. No tachypnea. No respiratory distress.  Neurological: He is alert. He is disoriented.  Skin: Skin is warm and dry. There is pallor.  Psychiatric: Cognition and memory are impaired. He is noncommunicative. He is inattentive.  Nursing note and vitals reviewed.          Vital Signs: BP 136/90 (BP Location: Left Arm)   Pulse (!) 117   Temp 98.3 F (36.8 C) (Oral)   Resp (!) 24   Ht 6' (1.829 m)   Wt 67.1 kg (148 lb)   SpO2 97%   BMI 20.07 kg/m  SpO2: SpO2: 97 % O2 Device: O2 Device: Room Air O2 Flow Rate:    Intake/output summary:   Intake/Output Summary (Last 24 hours) at 05/15/2017 1351 Last data filed at 05/14/2017 2306 Gross per 24 hour  Intake 480 ml  Output 0 ml  Net 480 ml   LBM: Last BM Date: 05/12/17 Baseline Weight: Weight: 70.3 kg (155 lb) Most recent weight: Weight: 67.1 kg (148 lb)        Palliative Assessment/Data: PPS 30%   Flowsheet Rows     Most Recent Value  Intake Tab  Referral Department  Hospitalist  Unit at Time of Referral  Med/Surg Unit  Palliative Care Primary Diagnosis  Neurology  Date Notified  05/09/17  Palliative Care Type  New Palliative care  Reason for referral  Clarify Goals of Care  Date of Admission  05/08/17  Date first seen by Palliative Care  05/09/17  # of days Palliative referral response time  0 Day(s)  # of days IP prior to Palliative referral  1  Clinical Assessment  Psychosocial & Spiritual Assessment  Palliative Care Outcomes      Patient Active Problem List   Diagnosis Date Noted  . A-fib (HCC) 05/08/2017  . 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 Dizziness 07/07/2016  . Cerebral hemorrhage(nontraumatic) (HCC) 06/30/2016  . Urolithiasis 03/28/2016  . Pyuria 03/28/2016  . Hydronephrosis 03/27/2016  . Cardiac arrhythmia 07/22/2015  . Atrial fibrillation with RVR (HCC) 02/24/2015  . Cardiac conduction disorder 02/04/2015  . Arthritis 02/04/2015  . Atrial fibrillation (HCC) 02/04/2015  . Acid reflux 02/04/2015  . H/O vertigo 02/04/2015  . HLD (hyperlipidemia) 02/04/2015  . BP (high blood pressure) 02/04/2015  . Calculus of kidney 02/04/2015  . Sick sinus syndrome (HCC) 02/04/2015  . Fungal infection of nail 02/04/2015  . Type 2 diabetes mellitus (HCC) 02/04/2015  . Sepsis (HCC) 12/10/2014  . Systemic infection (HCC) 12/10/2014  . Sepsis(995.91) 12/10/2014  . Altered bowel function 10/13/2014  . Chronic diarrhea 10/13/2014  . Change in bowel habits 10/13/2014  . Senile dementia of Alzheimer's type 09/20/2014  . Alzheimer's dementia, late onset 09/20/2014  . Drug resistance 10/22/2013    Palliative Care Assessment & Plan   Patient Profile: JamesThomasis a84 y.o.malewith a known history of atrial fibrillation, dementia, Recurrent kidney stones and a previous history of stroke who is brought by his  family due to progressive confusion  and agitation.  Assessment: Dementia Acute encephalopathy Afib RVR Dehydration  Malnutrition  Recommendations/Plan:  DNR. Continue current interventions.   Friend/Significant other at bedside understands poor prognosis secondary to advancing dementia and poor nutritional status. She tells me sons are still processing poor prognosis due to progressive dementia.   Plan is for SNF with palliative services to follow. Eligible for hospice services when family is ready.   Code Status: DNR   Code Status Orders  (From admission, onward)        Start     Ordered   05/09/17 1324  Do not attempt resuscitation (DNR)  Continuous    Question Answer Comment  In the event of cardiac or respiratory ARREST Do not call a "code blue"   In the event of cardiac or respiratory ARREST Do not perform Intubation, CPR, defibrillation or ACLS   In the event of cardiac or respiratory ARREST Use medication by any route, position, wound care, and other measures to relive pain and suffering. May use oxygen, suction and manual treatment of airway obstruction as needed for comfort.      05/09/17 1323    Code Status History    Date Active Date Inactive Code Status Order ID Comments User Context   05/08/2017 1725 05/09/2017 1323 Full Code [id number]  Auburn Bilberry, MD Inpatient   07/07/2016 1836 07/09/2016 1304 Full Code [id number]  Katha Hamming, MD ED   04/12/2016 2328 04/15/2016 1744 Full Code [id number]  Oralia Manis, MD Inpatient   03/27/2016 1709 03/28/2016 2209 Full Code [id number]  Ramonita Lab, MD Inpatient   02/24/2015 1523 02/25/2015 1743 Full Code [id number]  Katharina Caper, MD Inpatient   12/10/2014 0646 12/14/2014 1416 Full Code [id number]  Arnaldo Natal, MD Inpatient       Prognosis:   Unable to determine poor prognosis with progressive dementia and declining functional, cognitive, and nutritional status.   Discharge Planning:  Skilled Nursing Facility  for rehab with Palliative care service follow-up  Care plan was discussed with friend William Lozano) at bedside and RN  Thank you for allowing the Palliative Medicine Team to assist in the care of this patient.   Time In: 1320 Time Out: 1355 Total Time 35 min Prolonged Time Billed  no      Greater than 50%  of this time was spent counseling and coordinating care related to the above assessment and plan.  Vennie Homans, FNP-C Palliative Medicine Team  Phone: [phone] Fax: [phone]  Please contact Palliative Medicine Team phone at [phone] for questions and concerns.

## 2017-05-15 NOTE — Plan of Care (Signed)
Discharge meds, follow up, and discharge plan discussed with patient's son.

## 2017-05-15 NOTE — Progress Notes (Signed)
[employer] - [place] at [facility]   PATIENT NAME: William Lozano    MR#:  [id number]  DATE OF BIRTH:  05/21/1932  SUBJECTIVE:   His p.o. intake remains marginal.  Patient's son is at bedside.  More awake today.  No sundowning overnight.  REVIEW OF SYSTEMS:    Review of Systems  Unable to perform ROS: Dementia    Nutrition: Dysphagia 3. Tolerating Diet: Yes but poor Tolerating PT: Evaluation noted   DRUG ALLERGIES:   Allergies  Allergen Reactions  . Codeine Other (See Comments)    Causes side effects  . Penicillin G Rash    Has patient had a PCN reaction causing immediate rash, facial/tongue/throat swelling, SOB or lightheadedness with hypotension: Yes Has patient had a PCN reaction causing severe rash involving mucus membranes or skin necrosis: No Has patient had a PCN reaction that required hospitalization: No Has patient had a PCN reaction occurring within the last 10 years: No If all of the above answers are "NO", then may proceed with Cephalosporin use.  . Sulfa Antibiotics Rash    Pt and fam are saying he does not have allergy to sulfa now.    VITALS:  Blood pressure 136/90, pulse (!) 117, temperature 98.3 F (36.8 C), temperature source Oral, resp. rate (!) 24, height 6' (1.829 m), weight 67.1 kg (148 lb), SpO2 97 %.  PHYSICAL EXAMINATION:   Physical Exam  GENERAL:  84 y.o.-year-old patient lying in bed encephalopathic and lethargic but follows simple commands.  EYES: Pupils equal, round, reactive to light and accommodation. No scleral icterus. Extraocular muscles intact.  HEENT: Head atraumatic, normocephalic. Oropharynx and nasopharynx clear.  Dry oral mucosa NECK:  Supple, no jugular venous distention. No thyroid enlargement, no tenderness.  LUNGS: Normal breath sounds bilaterally, no wheezing, rales, rhonchi. No use of accessory muscles of respiration.  CARDIOVASCULAR: S1, S2 irregular. No murmurs, rubs, or gallops.  ABDOMEN: Soft,  nontender, nondistended. Bowel sounds present. No organomegaly or mass.  EXTREMITIES: No cyanosis, clubbing or edema b/l.    NEUROLOGIC: Cranial nerves II through XII are intact. No focal Motor or sensory deficits b/l.  Globally weak PSYCHIATRIC: The patient is alert and oriented x 1.  SKIN: No obvious rash, lesion, or ulcer.    LABORATORY PANEL:   CBC Recent Labs  Lab 05/15/17 0807  WBC 18.4*  HGB 14.9  HCT 45.4  PLT 349   ------------------------------------------------------------------------------------------------------------------  Chemistries  Recent Labs  Lab 05/14/17 1058 05/15/17 0807  NA 144 146*  K 2.9* 3.6  CL 112* 111  CO2 24 26  GLUCOSE 286* 148*  BUN 20 21*  CREATININE 0.95 0.98  CALCIUM 8.8* 8.9  MG 1.9  --    ------------------------------------------------------------------------------------------------------------------  Cardiac Enzymes Recent Labs  Lab 05/09/17 0924  TROPONINI <0.03   ------------------------------------------------------------------------------------------------------------------  RADIOLOGY:  No results found.   ASSESSMENT AND PLAN:   84 year old male with past medical history of dementia, hypertension, chronic atrial fibrillation, GERD, history of kidney stones, hyperlipidemia, osteoarthritis who presented to the hospital due to altered mental status/encephalopathy.  1.  Acute encephalopathy with underlying dementia- secondary to dehydration recent fall and progressive decline. -There is no evidence of acute infective infection.  Patient's mental status has since then improved. - Patient is more alert and has less sundowning.  Avoid benzodiazepines, continue Haldol as needed for agitation.  Cont. Low dose seroquel  Hold other psychotropic drugs for now. - avoid Benzo's. Keep awake during day and sleep at night if possible.  2.  Atrial fibrillation with rapid ventricular response - Patient still having variable rates  with agitation and exertion. -  Will advance metoprolol, cont. Cardizem for rate control. - Appreciate cardiology input, echocardiogram showing ejection fraction of 45-50%.  Patient not a candidate for anticoagulation given his high fall risk.  3.  Essential hypertension- continue metoprolol, Cardizem.  4.  History of seizures-continue Keppra.  5.  Dementia- cont. Exelon, continue Seroquel at bedtime.  Continue Restoril.  6.  Diarrhea-continue Lomotil.  7.  GERD-continue Protonix.  8.  Nutrition-patient's p.o. intake remains poor. Appreciate Dietary input and they will discuss with son about giving nutritional supplements.  - will start some Marinol for appetite stimulation and will monitor.   9.  Hypokalemia- improved w/ supplementation and will cont. To monitor.   Updated Pt's son about the plan of care. Likely discharge to SNF tomorrow with Palliative to follow.    All the records are reviewed and case discussed with Care Management/Social Worker. Management plans discussed with the patient, family and they are in agreement.  CODE STATUS: DNR  DVT Prophylaxis: Lovenox  TOTAL TIME TAKING CARE OF THIS PATIENT: 35 minutes.   POSSIBLE D/C IN 1-2 DAYS, DEPENDING ON CLINICAL CONDITION.   Houston SirenSAINANI,Jahmir Salo J M.D on 05/15/2017 at 3:59 PM  Between 7am to 6pm - Pager - [phone]  After 6pm go to [email or web address] - Social research officer, governmentpassword EPAS [facility]  [facility] [place] [facility]  Office  [phone]  CC: Primary care physician; Marguarite ArbourSparks, Jeffrey D, MD

## 2017-05-15 NOTE — Progress Notes (Signed)
Patient likes scrambled eggs.  Refuses to eat the eggs on his tray.  Son tasted the eggs and saws they are powdered and taste terrible.  Patient may eat eggs if they were real eggs.

## 2017-05-15 NOTE — Progress Notes (Signed)
Speech Language Pathology Treatment: Dysphagia  Patient Details Name: William KLUS Sr. MRN: [id number] DOB: 1932-09-09 Today's Date: 05/15/2017 Time: 4098-1191 SLP Time Calculation (min) (ACUTE ONLY): 60 min  Assessment / Plan / Recommendation Clinical Impression  Pt seen at the very end of his dinner meal w/ a family friend helping to feed him tonight. Pt had not been in an optimal position in the bed for oral intake; pt repositioned more upright for easier breathing. When this SLP arrived post last po's being taken, pt was presenting w/ increased WOB and congested respirations but clear vocal quality. Family friend stated it seemed "worse" than earlier b/f the meal. Pt coughed intermittently and congestion was noted; he could not expectorate anything. When checking his O2 sats, O2 sats were 88-90; HR 103-112. Family stated he had only taken spoonfuls of the soup and a few bites of the magic cup ice cream supplement b/f refusing further. Pt appeared to have increased mucous orally and intermittently when he coughed, he appeared to have increased mucous and gagged on it - unsure if related to Esophageal mucous and Reflux phlegm, or potential aspiration of the po's. Also, pt's friend reported min oral phase deficits c/b oral hesitation and holding b/f swallowing - suspect related to his Dementia and/or increased WOB. Post session, oral care given which cleared mucous. RT suctioned as well causing pt to cough harder and expectorate larger amount of phlegm that RT felt was from the Esophagus possible.  Due to pt's baseline Dementia and increased risk for aspiration, recommend an objective assessment of his swallowing tomorrow(MBSS). NSG updated, contacted MD who agreed. ST will f/u w/ such in the morning. Recommend continue pills in Puree w/ strict aspiration precautions w/ any oral intake and holding po's if increased s/s of aspiration noted. NSG also placed [place] O2 for support.    HPI HPI: William Lozano   is a 84 y.o. male with a known history of atrial fibrillation, osteoarthritis, dementia, essential hypertension, GERD, Recurrent kidney stones and a previous history of stroke who is brought by his family due to progressive confusion and agitation.  Patient's had a fall last month and according to his family friend and his brother who is here state that he is gotten worse since then.  Patient has not had much pain however has had difficulty with ambulation.  He has not been eating or drinking much.  In the emergency room he was noticed to have A. fib with RVR.  He appears very dehydrated.  He also has leukocytosis however there is no evidence of infection.  Patient is restless trying to get out of bed. Pt's sister and nsg report that pt has been tolerating diet well with no straws.       SLP Plan  New goals to be determined pending instrumental study       Recommendations  Diet recommendations: Dysphagia 3 (mechanical soft);Thin liquid Liquids provided via: Cup;No straw Medication Administration: Crushed with puree(or in liquid form mixed in puree) Supervision: Staff to assist with self feeding;Full supervision/cueing for compensatory strategies Compensations: Minimize environmental distractions;Slow rate;Small sips/bites;Lingual sweep for clearance of pocketing;Multiple dry swallows after each bite/sip;Follow solids with liquid Postural Changes and/or Swallow Maneuvers: Seated upright 90 degrees;Upright 30-60 min after meal                General recommendations: (Dietician following) Oral Care Recommendations: Oral care BID;Staff/trained caregiver to provide oral care Follow up Recommendations: Skilled Nursing facility SLP Visit Diagnosis: Dysphagia, oropharyngeal phase (R13.12)  Plan: New goals to be determined pending instrumental study       GO               Jerilynn SomKatherine Alexandru Moorer, MS, CCC-SLP Aleshia Cartelli 05/15/2017, 6:07 PM

## 2017-05-15 NOTE — Progress Notes (Addendum)
Nutrition Follow Up Note   DOCUMENTATION CODES:   Not applicable  INTERVENTION:   Ensure Enlive po BID, each supplement provides 350 kcal and 20 grams of protein  Magic cup TID with meals, each supplement provides 290 kcal and 9 grams of protein  Unjury chicken Soup TID, Each serving provides 100kcal and 21g protein   Prostat liquid protein PO 30 ml BID with meals, each supplement provides 100 kcal, 15 grams protein.  Vital Cuisine TID, each supplement provides 520kcal and 22g of protein.   MVI daily   NUTRITION DIAGNOSIS:   Inadequate oral intake related to acute illness as evidenced by per patient/family report  GOAL:   Patient will meet greater than or equal to 90% of their needs -not met   MONITOR:   PO intake, Supplement acceptance, Labs, Weight trends   ASSESSMENT:   [name] w/ Alzheimers presents w/ strong smelling dark urine, confusion, suspected dehydration, and A-fib and suspected gout to RLE.    RD received consult for calorie count. Visited pt's room today. Unable to speak with pt r/t dementia so spoke with pt's son at bedside. Pt eating <25% of his meals. For breakfast this morning, pt ate 20% of his pancakes, 10% of some scrambled eggs, drank some sprite and 1/2 of an Ensure. Pt is not meeting his estimated needs via oral intake. Spoke with pt's son and MD; although temporary NGT placement and nutrition support may improve gut integrity and stimulate appetite, pt is not a good candidate as he would most likely pull out the tube. Family is not interested in long term PEG placement. Goal for this pt is to maximize nutrition via supplements as much as possible. RD discussed with pt's son, plan is to order multiple supplements to see which supplements pt is willing to drink. RD will follow up 3/26 and make recommendations for supplements to be placed on discharge summary. MD will start appetite stimulant today. Per chart, pt is weight stable since admit. RD will  discontinue calorie count at this time.    Medications: aspirin, lovenox, MVI, protonix, KCl,   Labs: K 3.6 wnl, Na 146(H), BUN 21(H) Mg 1.9 wnl- 3/25 Wbc- 18.4(H) cbgs- 286, 148 x 24 hrs  Diet Order:  Fall precautions Aspiration precautions DIET DYS 3 Room service appropriate? Yes; Fluid consistency: Thin  EDUCATION NEEDS:   Not appropriate for education at this time  Skin:   Reviewed RN Assessment  Last BM:  3/23- type 6  Height:   Ht Readings from Last 1 Encounters:  05/09/17 6' (1.829 m)    Weight:   Wt Readings from Last 1 Encounters:  05/15/17 148 lb (67.1 kg)    Ideal Body Weight:  80.9 kg  BMI:  Body mass index is 20.07 kg/m.  Estimated Nutritional Needs:   Kcal:  1700-2000 kcal  Protein:  81-95 grams protein  Fluid:  >2 L  Koleen Distance MS, RD, LDN Pager #[phone] After Hours Pager: [phone]

## 2017-05-15 NOTE — Clinical Social Work Note (Signed)
CSW spoke to patient's son Fayrene FearingJames, and significant other Polly who was at bedside to discuss SNF bed offers.  Patient's family have chosen [facility].  CSW spoke with [facility] and they can accept patient once he is medically ready for discharge and orders have been received.  CSW to continue to follow patient's progress throughout discharge planning.  Ervin KnackEric R. Hassan Rowannterhaus, MSW, Theresia MajorsLCSWA [phone]  05/14/17 12:30pm

## 2017-05-15 NOTE — Progress Notes (Addendum)
He drank the full 8 ounces of unjury chicken soup.

## 2017-05-16 ENCOUNTER — Inpatient Hospital Stay: Payer: Medicare Other

## 2017-05-16 DIAGNOSIS — Z515 Encounter for palliative care: Secondary | ICD-10-CM

## 2017-05-16 DIAGNOSIS — Z7189 Other specified counseling: Secondary | ICD-10-CM

## 2017-05-16 DIAGNOSIS — F0391 Unspecified dementia with behavioral disturbance: Secondary | ICD-10-CM

## 2017-05-16 LAB — BASIC METABOLIC PANEL
ANION GAP: 11 (ref 5–15)
BUN: 33 mg/dL — ABNORMAL HIGH (ref 6–20)
CALCIUM: 9 mg/dL (ref 8.9–10.3)
CO2: 26 mmol/L (ref 22–32)
Chloride: 110 mmol/L (ref 101–111)
Creatinine, Ser: 1.14 mg/dL (ref 0.61–1.24)
GFR, EST NON AFRICAN AMERICAN: 58 mL/min — AB (ref >60)
Glucose, Bld: 170 mg/dL — ABNORMAL HIGH (ref 65–99)
POTASSIUM: 3.8 mmol/L (ref 3.5–5.1)
Sodium: 147 mmol/L — ABNORMAL HIGH (ref 135–145)

## 2017-05-16 MED ORDER — MORPHINE SULFATE (CONCENTRATE) 10 MG/0.5ML PO SOLN: 5.0000 mg | ORAL | Status: DC | PRN

## 2017-05-16 MED ORDER — NEPRO/CARBSTEADY PO LIQD: 237.0000 mL | Freq: Three times a day (TID) | ORAL | Status: DC

## 2017-05-16 MED ORDER — PRO-STAT SUGAR FREE PO LIQD: 30.0000 mL | Freq: Three times a day (TID) | ORAL | Status: DC

## 2017-05-16 MED ORDER — GLYCOPYRROLATE 0.2 MG/ML IJ SOLN
0.2000 mg | INTRAMUSCULAR | Status: DC | PRN
  Filled 2017-05-16: qty 1

## 2017-05-16 MED ORDER — GLUCERNA SHAKE PO LIQD
237.0000 mL | Freq: Three times a day (TID) | ORAL | Status: DC
  Administered 2017-05-16: 237 mL via ORAL

## 2017-05-16 NOTE — Progress Notes (Signed)
[facility] [place] [facility]   PATIENT NAME: William MaudlinJames Lozano    MR#:  [id number]  DATE OF BIRTH:  06/26/1932  SUBJECTIVE:   Patient continues to be lethargic/encephalopathic.  Sleepy this morning.  Patient's son is at bedside.  Patient had some upper airway gurgling and hypoxia yesterday and chest x-ray showed mild pulmonary edema and therefore received some IV Lasix.  Seen by speech therapy this morning and diet has been downgraded to dysphagia 1 with nectar thick liquids.  Patient also noted to be in mild acute kidney injury with some mild hypernatremia.  REVIEW OF SYSTEMS:    Review of Systems  Unable to perform ROS: Dementia    Nutrition: Dysphagia 3. Tolerating Diet: Yes but poor Tolerating PT: Evaluation noted   DRUG ALLERGIES:   Allergies  Allergen Reactions  . Codeine Other (See Comments)    Causes side effects  . Penicillin G Rash    Has patient had a PCN reaction causing immediate rash, facial/tongue/throat swelling, SOB or lightheadedness with hypotension: Yes Has patient had a PCN reaction causing severe rash involving mucus membranes or skin necrosis: No Has patient had a PCN reaction that required hospitalization: No Has patient had a PCN reaction occurring within the last 10 years: No If all of the above answers are "NO", then may proceed with Cephalosporin use.  . Sulfa Antibiotics Rash    Pt and fam are saying he does not have allergy to sulfa now.    VITALS:  Blood pressure 114/72, pulse 95, temperature 98.5 F (36.9 C), temperature source Axillary, resp. rate 19, height 6' (1.829 m), weight 66.2 kg (146 lb), SpO2 95 %.  PHYSICAL EXAMINATION:   Physical Exam  GENERAL:  84 y.o.-year-old patient lying in bed encephalopathic and lethargic EYES: Pupils equal, round, reactive to light and accommodation. No scleral icterus. Extraocular muscles intact.  HEENT: Head atraumatic, normocephalic. Oropharynx and nasopharynx clear.  Dry oral  mucosa NECK:  Supple, no jugular venous distention. No thyroid enlargement, no tenderness.  LUNGS: Normal breath sounds bilaterally, no wheezing, rales, rhonchi. No use of accessory muscles of respiration.  CARDIOVASCULAR: S1, S2 irregular. No murmurs, rubs, or gallops.  ABDOMEN: Soft, nontender, nondistended. Bowel sounds present. No organomegaly or mass.  EXTREMITIES: No cyanosis, clubbing or edema b/l.    NEUROLOGIC: Cranial nerves II through XII are intact. No focal Motor or sensory deficits b/l.  Globally weak PSYCHIATRIC: The patient is alert and oriented x 1.  SKIN: No obvious rash, lesion, or ulcer.    LABORATORY PANEL:   CBC Recent Labs  Lab 05/15/17 0807  WBC 18.4*  HGB 14.9  HCT 45.4  PLT 349   ------------------------------------------------------------------------------------------------------------------  Chemistries  Recent Labs  Lab 05/14/17 1058  05/16/17 0806  NA 144   < > 147*  K 2.9*   < > 3.8  CL 112*   < > 110  CO2 24   < > 26  GLUCOSE 286*   < > 170*  BUN 20   < > 33*  CREATININE 0.95   < > 1.14  CALCIUM 8.8*   < > 9.0  MG 1.9  --   --    < > = values in this interval not displayed.   ------------------------------------------------------------------------------------------------------------------  Cardiac Enzymes No results for input(s): TROPONINI in the last 168 hours. ------------------------------------------------------------------------------------------------------------------  RADIOLOGY:  Dg Chest Port 1 View  Result Date: 05/15/2017 CLINICAL DATA:  Dyspnea, cough EXAM: PORTABLE CHEST 1 VIEW COMPARISON:  05/08/2017 chest  radiograph. FINDINGS: Stable cardiomediastinal silhouette with mild cardiomegaly. No pneumothorax. Small bilateral pleural effusions. Mild pulmonary edema. Hazy bibasilar lung opacities. IMPRESSION: 1. Mild cardiomegaly with mild pulmonary edema, compatible with mild congestive heart failure. 2. Small bilateral pleural  effusions. 3. Hazy bibasilar lung opacities, which could represent atelectasis, aspiration and/or pneumonia. Electronically Signed   By: Delbert Phenix M.D.   On: 05/15/2017 18:40     ASSESSMENT AND PLAN:   84 year old male with past medical history of dementia, hypertension, chronic atrial fibrillation, GERD, history of kidney stones, hyperlipidemia, osteoarthritis who presented to the hospital due to altered mental status/encephalopathy.  1.  Acute encephalopathy with underlying dementia- secondary to dehydration recent fall and progressive decline. -There is no evidence of acute infective infection.  Patient continues to be lethargic/encephalopathic. -Continue low-dose Seroquel, hold other psychotropic drugs.  Continue Haldol as needed for agitation..   2.  Atrial fibrillation with rapid ventricular response - Patient still having variable rates with agitation and exertion. -  Cont. Metoprolol, cont. Cardizem for rate control. - Appreciate cardiology input, echocardiogram showing ejection fraction of 45-50%.  Patient not a candidate for anticoagulation given his high fall risk.  3.  Essential hypertension- continue metoprolol, Cardizem.  4.  History of seizures-continue Keppra.  5.  Dementia- cont. Exelon, continue Seroquel at bedtime.  Continue Restoril.  6.  Diarrhea-continue Lomotil.  7.  GERD-continue Protonix.  8.  Nutrition- though intake continues to be fairly poor given his encephalopathy and underlying dementia.  Continue appetite stimulants with Marinol, encourage p.o. intake as tolerated.  Patient now downgraded to a dysphagia 1 diet with nectar thick liquids to high risk for aspiration.  9.  Hypokalemia- improved w/ supplementation and will cont. To monitor.   All the records are reviewed and case discussed with Care Management/Social Worker. Management plans discussed with the patient, family and they are in agreement.  CODE STATUS: DNR  DVT Prophylaxis:  Lovenox  TOTAL TIME TAKING CARE OF THIS PATIENT: 25 minutes.   POSSIBLE D/C IN 1-2 DAYS, DEPENDING ON CLINICAL CONDITION.   Houston Siren M.D on 05/16/2017 at 3:46 PM  Between 7am to 6pm - Pager - [phone]  After 6pm go to [email or web address] - Social research officer, government  [facility]  Office  [phone]  CC: Primary care physician; Marguarite Arbour, MD

## 2017-05-16 NOTE — Progress Notes (Addendum)
Girlfriend requesting bed alarm to be off so she can lean on bed without alarm sounding. Patient not impulsive at this time. Educated girlfriend to call staff before she leaves.

## 2017-05-16 NOTE — Progress Notes (Signed)
New hospice home referral received from [facility]. Patient is an 84 year old man with a known history of Alzheimer's dementia, A fib, HLD, GERD, essential hypertension, recurrent kidney stones and stroke admitted to [facility] on 3/19 from home for evaluation and treatment of progressive confusion and agitation. In the ED he was found to be in A fib with RVR and leukocytosis with no source of infection. Family reported poor oral intake. He has been treated with IV antibiotics an continues with elevated white count. Palliative medicine was first consulted for goals of care on 3/20 and have remained involved. Original plan was for patient to discharge to SNF for short term rehab, but he had another decline over night with decrased oxygen saturations and lethargy. Chest xray showed mild pulmonary edema and possible PNA.  Palliative Medicine met again to day with patient and his family, they have chosen to focus on comfort with transfer to the hospice home. Writer visited with patient and his sister this afternoon, he was sitting up in bed alert, taking bites of applesauce. Staff RN Lovena Le present and reported that patient was the most alert he had been all day. Family voiced understanding of plan for hospice home. Writer to meet with patient's son Akim in the morning, with planned transfer following. Hospital care team updated. Patient information faxed to referral. Flo Shanks RN, BSN, [facility] [place], hospital Liaison [phone]

## 2017-05-16 NOTE — Clinical Social Work Note (Signed)
CSW was informed that palliative is recommending hospice facility placement.  CSW discussed with patient's family which hospice facility they would like to go and [facility] [address] [facility] [address]  [facility].  CSW awaiting bed availability, CSW to continue to follow throughout discharge planning.  William KnackEric R. Ezri Lozano, MSW, William MajorsLCSWA [phone]  05/16/2017 6:28 PM

## 2017-05-16 NOTE — Progress Notes (Signed)
Palliative:  I met today at William Lozano' bedside along with his girlfriend, sister, and brother. They all tell me that they have had a family discussion together with William Lozano' two sons and that all agree after discussion with William Lozano that he is not improving and they have decided to pursue comfort care and hospice. They all agree that he is not improving and has no QOL. 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 We reviewed how he would be horrified to see himself in this condition and would not want to live this way. We discussed prognosis as likely <2 weeks with almost no intake and likely aspiration. Therapeutic listening and emotional support provided.   I also called to verify this decision with HCPOA, son William Lozano, and he verified that he agrees with comfort and transition to hospice facility. He is at work so not readily available by phone or to talk. He has no concerns at this time. He plans to be back in the morning after work (works second shift).   William Lozano slept throughout our meeting but did arouse to greet an old friend as we were leaving. Prognosis poor with no intake and worsening. I would be surprised if he could swallow pills much longer and I worry he is aspirating on them now - will minimize for comfort. He has struggled with "congestion" per family and from their description sounds like he required NTS suction yesterday - adding Robinul prn.   43 min  Vinie Sill, NP Palliative Medicine Team Pager # [phone] (M-F 8a-5p) Team Phone # [phone] (Nights/Weekends)

## 2017-05-16 NOTE — Progress Notes (Signed)
STAT Basic Met Panel per Dr. Verdell Carmine

## 2017-05-16 NOTE — Progress Notes (Signed)
Advanced care plan.  Purpose of the Encounter: CODE STATUS  Parties in Attendance: Patient, patient's son and sister.  Patient's Decision Capacity: Patient does not have capacity to make decisions.  Subjective/Patient's story: Patient is a 84 year old male with history of advanced dementia with progressive decline in his mental status now noted to be encephalopathic lethargic with failure to thrive and is moderate to severe protein calorie malnutrition.  Objective/Medical story: Explained to the patient's son about patient's poor prognosis and likely irreversible underlying dementia with failure to thrive and protein calorie malnutrition.  Goals of care determination: Discussed extensively with the patient's son about the patient's irreversibility of the condition and his poor prognosis as he is not improved over the past 48-72 hours.  Patient's pain is quite encephalopathic with poor p.o. intake with mild periods of weakness although the amount that the patient needs is not compatible with life.  Patient is high risk for getting dehydrated.  Patient's son was in agreement with talking to palliative care services or considering hospice services.  CODE STATUS: DNR, with possible consideration for hospice services or hospice home.   Time spent discussing advanced care planning: 16 minutes

## 2017-05-16 NOTE — Progress Notes (Signed)
Patient remains sleepy, has been awake off and on with family visiting. Only eating sips and bites. Heart rate continuing to fluctuate. Per son, they are okay with the option of hospice at this point. Palliative team has rounded. Dr. Cherlynn KaiserSainani updated. No new orders. Will continue to monitor.

## 2017-05-16 NOTE — Care Management (Signed)
Patient has declined and discharge disposition is now to discharge to a hospice facility.

## 2017-05-16 NOTE — Evaluation (Signed)
Objective Swallowing Evaluation: Type of Study: MBS-Modified Barium Swallow Study   Patient Details  Name: William RAETHER Sr. MRN: [id number] Date of Birth: [birth year]  Today's Date: 05/16/2017 Time: SLP Start Time (ACUTE ONLY): 1000 -SLP Stop Time (ACUTE ONLY): 1100  SLP Time Calculation (min) (ACUTE ONLY): 60 min   Past Medical History:  Past Medical History:  Diagnosis Date  . Arthritis   . Atrial fibrillation (HCC) 2016   paroxysmal atrial fibrillation, sick sinus syndrome  . Complication of anesthesia    affects memory for a longer time after  . Dementia    alzheimers  . Essential hypertension   . GERD (gastroesophageal reflux disease)   . History of kidney stones   . Hyperlipemia   . Inguinal hernia recurrent bilateral   . Kidney stones   . Stroke [facility])    Past Surgical History:  Past Surgical History:  Procedure Laterality Date  . CYSTOSCOPY W/ RETROGRADES Bilateral 03/27/2016   Procedure: CYSTOSCOPY WITH RETROGRADE PYELOGRAM;  Surgeon: Vanna Scotland, MD;  Location: [facility];  Service: Urology;  Laterality: Bilateral;  . CYSTOSCOPY W/ URETERAL STENT PLACEMENT Bilateral 02/24/2015   Procedure: CYSTOSCOPY WITH STENT REPLACEMENT;  Surgeon: Hildred Laser, MD;  Location: [facility];  Service: Urology;  Laterality: Bilateral;  . CYSTOSCOPY W/ URETERAL STENT PLACEMENT Left 04/11/2016   Procedure: CYSTOSCOPY WITH STENT REPLACEMENT;  Surgeon: Vanna Scotland, MD;  Location: [facility];  Service: Urology;  Laterality: Left;  . CYSTOSCOPY W/ URETERAL STENT REMOVAL Right 04/11/2016   Procedure: CYSTOSCOPY WITH STENT REMOVAL;  Surgeon: Vanna Scotland, MD;  Location: [facility];  Service: Urology;  Laterality: Right;  . CYSTOSCOPY WITH RETROGRADE PYELOGRAM, URETEROSCOPY AND STENT PLACEMENT Bilateral 12/10/2014   Procedure: CYSTOSCOPY WITH RETROGRADE PYELOGRAM, URETEROSCOPY AND STENT PLACEMENT;  Surgeon: Hildred Laser, MD;  Location: [facility];  Service: Urology;  Laterality: Bilateral;   . CYSTOSCOPY WITH STENT PLACEMENT Bilateral 12/10/2014   Procedure: CYSTOSCOPY WITH STENT PLACEMENT;  Surgeon: Hildred Laser, MD;  Location: [facility];  Service: Urology;  Laterality: Bilateral;  . CYSTOSCOPY WITH STENT PLACEMENT Bilateral 03/27/2016   Procedure: CYSTOSCOPY WITH STENT PLACEMENT;  Surgeon: Vanna Scotland, MD;  Location: [facility];  Service: Urology;  Laterality: Bilateral;  . EXTRACORPOREAL SHOCK WAVE LITHOTRIPSY Right 01/07/2015   Procedure: EXTRACORPOREAL SHOCK WAVE LITHOTRIPSY (ESWL);  Surgeon: Lorraine Lax, MD;  Location: [facility];  Service: Urology;  Laterality: Right;  . EXTRACORPOREAL SHOCK WAVE LITHOTRIPSY Left 01/21/2015   Procedure: EXTRACORPOREAL SHOCK WAVE LITHOTRIPSY (ESWL);  Surgeon: Hildred Laser, MD;  Location: [facility];  Service: Urology;  Laterality: Left;  . EYE SURGERY Bilateral    Cataract Extraction with IOL  . HERNIA REPAIR Bilateral    inguinal  . URETEROSCOPY WITH HOLMIUM LASER LITHOTRIPSY Bilateral 02/24/2015   Procedure: URETEROSCOPY WITH HOLMIUM LASER LITHOTRIPSY /STONE BASKETING;  Surgeon: Hildred Laser, MD;  Location: [facility];  Service: Urology;  Laterality: Bilateral;  . URETEROSCOPY WITH HOLMIUM LASER LITHOTRIPSY Bilateral 04/11/2016   Procedure: URETEROSCOPY WITH HOLMIUM LASER LITHOTRIPSY;  Surgeon: Vanna Scotland, MD;  Location: [facility];  Service: Urology;  Laterality: Bilateral;   HPI: William Lozano  is a 84 y.o. male with a known history of atrial fibrillation, osteoarthritis, dementia, essential hypertension, GERD, Recurrent kidney stones and a previous history of stroke who is brought by his family due to progressive confusion and agitation.  Patient's had a fall last month and according to his family friend and his brother who is here state that he is  gotten worse since then.  Patient has not had much pain however has had difficulty with ambulation.  He has not been eating or drinking much.  In the emergency room he was noticed to  have A. fib with RVR.  He appears very dehydrated.  He also has leukocytosis however there is no evidence of infection.  Patient is restless trying to get out of bed. Pt's sister and nsg report that pt has been tolerating diet well with no straws.    Subjective: Pt appeared more drowsy/sleepy and required more min-mod verbal cues to awaken for assessment; pt appeared fatigued, weak.    Assessment / Plan / Recommendation  CHL IP CLINICAL IMPRESSIONS 05/16/2017  Clinical Impression Pt appears to present w/ Moderate Oropharyngeal Phase Dysphagia and is at increased risk for prandial Aspiration of oral intake, and Aspiration of Reflux material from the Esophagus post intake. During the Oral Phase, pt exhibited min-mod increased lingual/oral activity w/ increased oral phase time for A-P transfer; also min oral holding. This resulted in decreased oral control and premature spillage moreso of liquids. Premature spillage of Nectar liquids reached to/into the pyriform sinuses. Min, diffuse oral and BOT residue remained w/ all trials which pt could only reduce w/ intermittent lingual sweeping and reswallow. Pt was verbally cued to reswallow(use strategies), however, pt could not follow through w/ instructions - pt does have baseline Dementia. He exhibited a min munching pattern w/ puree trials. During the Pharygneal Phase, pt exhibited moderately delayed pharyngeal swallow initiation w/ trials of Nectar liquids; pharyngeal swallow was not initiated until Nectar liquids had spilled to the Pyrifrom Sinuses; slighlty better timing(~at Valleculae) was noted w/ puree trials as well. During the swallow, Epiglottic inversion was adequate but at times only shifted posteriorly to meet the Pharyngeal Wall at times. This was noted moreso w/ a f/u swallow d/t the decreased effort. Decreased pharyngeal pressure and decreased laryngeal excursion were noted as evidenced by the Moderate bolus residue that remained throughout the  Valleculae(moderate), Pyriform Sinuses, and the posterial Pharyngeal Wall. Again, pt could not follow through consistently w/ a f/u, dry swallow in attempts to clear the pharyngeal residue despite verbal cues by SLP. Due to the pharyngeal residue remaining moreso in the Valleculae and reduced Epiglottic closure offering protection of the airway entrance, pt is at risk for Aspiration during the swallow AND after the swallow from the Pharyngeal Residue as well as the impact of Esophageal dysmotility - pt exhibited min bolus stasis in the (viewable) Cervical Esophagus. This  noted residue appeared to clear w/ time. Any Esophageal Residue/phlegm, Reflux, or Regurgitation can move back into the Pharynx then into the airway. This would increase risk for Pulmonary decline. All liquids given via Cup.   SLP Visit Diagnosis Dysphagia, oropharyngeal phase (R13.12);Dysphagia, pharyngoesophageal phase (R13.14)  Attention and concentration deficit following --  Frontal lobe and executive function deficit following --  Impact on safety and function Moderate aspiration risk      CHL IP TREATMENT RECOMMENDATION 05/16/2017  Treatment Recommendations Therapy as outlined in treatment plan below     Prognosis 05/16/2017  Prognosis for Safe Diet Advancement Fair  Barriers to Reach Goals Cognitive deficits;Severity of deficits  Barriers/Prognosis Comment --    CHL IP DIET RECOMMENDATION 05/16/2017  SLP Diet Recommendations Dysphagia 1 (Puree) solids;Nectar thick liquid  Liquid Administration via Cup;No straw  Medication Administration Crushed with puree  Compensations Minimize environmental distractions;Slow rate;Small sips/bites;Lingual sweep for clearance of pocketing;Multiple dry swallows after each bite/sip;Follow solids with liquid  Postural Changes Remain semi-upright after after feeds/meals (Comment);Seated upright at 90 degrees      CHL IP OTHER RECOMMENDATIONS 05/16/2017  Recommended Consults Consider GI  evaluation  Oral Care Recommendations Oral care BID;Staff/trained caregiver to provide oral care  Other Recommendations Order thickener from pharmacy;Prohibited food (jello, ice cream, thin soups);Remove water pitcher;Have oral suction available      CHL IP FOLLOW UP RECOMMENDATIONS 05/16/2017  Follow up Recommendations Skilled Nursing facility      [facility] IP FREQUENCY AND DURATION 05/16/2017  Speech Therapy Frequency (ACUTE ONLY) min 3x week  Treatment Duration 2 weeks           CHL IP ORAL PHASE 05/16/2017  Oral Phase Impaired  Oral - Pudding Teaspoon --  Oral - Pudding Cup --  Oral - Honey Teaspoon NT  Oral - Honey Cup --  Oral - Nectar Teaspoon 7  Oral - Nectar Cup --  Oral - Nectar Straw --  Oral - Thin Teaspoon NT  Oral - Thin Cup --  Oral - Thin Straw --  Oral - Puree 5  Oral - Mech Soft NT  Oral - Regular NT  Oral - Multi-Consistency NT  Oral - Pill NT  Oral Phase - Comment Pt exhibited min-mod increased lingual/oral activity w/ increased oral phase time for A-P transfer; also min oral holding. This resulted in decreased oral control and premature spillage moreso of liquids. Premature spillage of Nectar liquids reached to/into the pyriform sinuses. Min, diffuse oral and BOT residue remained w/ all trials which pt could only reduce w/ intermittent lingual sweeping and reswallow. Pt was verbally cued to reswallow(use strategies), however, pt could not follow through w/ instructions - pt does have baseline Dementia. Pt exhibited min munching pattern w/ the puree trials.     CHL IP PHARYNGEAL PHASE 05/16/2017  Pharyngeal Phase Impaired  Pharyngeal- Pudding Teaspoon NT  Pharyngeal --  Pharyngeal- Pudding Cup --  Pharyngeal --  Pharyngeal- Honey Teaspoon NT  Pharyngeal --  Pharyngeal- Honey Cup --  Pharyngeal --  Pharyngeal- Nectar Teaspoon 7  Pharyngeal --  Pharyngeal- Nectar Cup --  Pharyngeal --  Pharyngeal- Nectar Straw --  Pharyngeal --  Pharyngeal- Thin Teaspoon NT   Pharyngeal --  Pharyngeal- Thin Cup --  Pharyngeal --  Pharyngeal- Thin Straw --  Pharyngeal --  Pharyngeal- Puree 5  Pharyngeal --  Pharyngeal- Mechanical Soft NT  Pharyngeal --  Pharyngeal- Regular NT  Pharyngeal --  Pharyngeal- Multi-consistency NT  Pharyngeal --  Pharyngeal- Pill NT  Pharyngeal --  Pharyngeal Comment Pt exhibited moderately delayed pharyngeal swallow initiation w/ trials of Nectar liquids; pharyngeal swallow was not initiated until Nectar liquids had spilled to the Pyrifrom Sinuses; similar timing(~at Valleculae) was noted w/ puree trials as well. During the swallow, Epiglottic inversion was adequate but at times only shifted posteriorly to meet the Pharyngeal Wall. This was noted moreso w/ a f/u swallow d/t the decreased effort. Decreased pharyngeal pressure and decreased laryngeal excursion were noted as evidenced by the Moderate bolus residue that remained throughout the Valleculae(moderate), Pyriform Sinuses, and the posterial Pharyngeal Wall. Again, pt could not follow through consistently w/ a f/u, dry swallow in attempts to clear the pharyngeal residue despite verbal cues by SLP. Due to the pharyngeal residue remaining moreso in the Valleculae and reduced Epiglottic closure offering protection of the airway entrance, pt is at risk for Aspiration during the swallow AND after the swallow from the Pharyngeal Residue as well as the Esophageal dysmotility - any Esophageal  Residue/phlegm, Reflux, or Regurgitation can move back into the Pharynx then into the airway. This would increase risk for Pulmonary decline. All liquids given via TSP for bolus control.      CHL IP CERVICAL ESOPHAGEAL PHASE 05/16/2017  Cervical Esophageal Phase Impaired  Pudding Teaspoon NT  Pudding Cup --  Honey Teaspoon NT  Honey Cup --  Nectar Teaspoon 7  Nectar Cup --  Nectar Straw --  Thin Teaspoon NT  Thin Cup --  Thin Straw --  Puree 5  Mechanical Soft NT  Regular NT   Multi-consistency NT  Pill NT  Cervical Esophageal Comment Pt exhibited min bolus stasis in the (viewable) Cervical Esophagus. Residue appeared to clear w/ time.     No flowsheet data found.    Jerilynn SomKatherine Eshan Trupiano, MS, CCC-SLP Keylen Uzelac 05/16/2017, 2:21 PM

## 2017-05-16 NOTE — Progress Notes (Signed)
Dr. Cherlynn KaiserSainani aware that patient having trouble swallowing some of the pills that cannot be crushed such as cardizem. No orders at this time. Palliative to see patient/family hopefully today.

## 2017-05-16 NOTE — Progress Notes (Signed)
Physical Therapy Treatment Patient Details Name: HEBERT DOOLING Sr. MRN: [id number] DOB: 1932-08-20 Today's Date: 05/16/2017    History of Present Illness Pt admitted for Afib. Pt with elevated HR this admission, currently on cardizem drip with HR under control. History includes dementia, HTN, GERD, and CVA on 06/2016. Pt is poor historian and most of history obtained from CSW who spoke to son. Per son, pt with fast decline in past 2 weeks including falls and increased AMS.     PT Comments    Chart reviewed and discussed with primary nurse prior to session.  OK for bedside exercises.  Pt with eyes closed but awakes and opens eyes initially but keeps them closed for remainder of session.  Participated in exercises as described below.  Pt with c/o joint pain in BLE's with P/AAROM but it does seem to relieve with repetitions.  He is repositioned on right side after session.  He is not able to tolerate any further bed mobility at this time.   Follow Up Recommendations  SNF     Equipment Recommendations  None recommended by PT    Recommendations for Other Services       Precautions / Restrictions Precautions Precautions: Fall Restrictions Weight Bearing Restrictions: No    Mobility  Bed Mobility               General bed mobility comments: too lethargic to attempt  Transfers                 General transfer comment: too lethargic to attempt  Ambulation/Gait             General Gait Details: unable at this time   Stairs            Wheelchair Mobility    Modified Rankin (Stroke Patients Only)       Balance                                            Cognition Arousal/Alertness: Lethargic Behavior During Therapy: WFL for tasks assessed/performed Overall Cognitive Status: Impaired/Different from baseline                                        Exercises Other Exercises Other Exercises: BLE and UE P/AAROM  for  ankle pumps, heel slides, ab/add, SLR shld flexion, horizontal ab/add and elbow flexion x 10    General Comments        Pertinent Vitals/Pain Pain Assessment: Faces Faces Pain Scale: Hurts even more Pain Location: B LE's with P/AAROM R > L ,  comfortable at rest Pain Descriptors / Indicators: Sore Pain Intervention(s): Limited activity within patient's tolerance;Monitored during session    Home Living                      Prior Function            PT Goals (current goals can now be found in the care plan section) Progress towards PT goals: Not progressing toward goals - comment    Frequency    Min 2X/week      PT Plan Current plan remains appropriate    Co-evaluation              AM-PAC PT "6 Clicks" Daily  Activity  Outcome Measure  Difficulty turning over in bed (including adjusting bedclothes, sheets and blankets)?: Unable Difficulty moving from lying on back to sitting on the side of the bed? : Unable Difficulty sitting down on and standing up from a chair with arms (e.g., wheelchair, bedside commode, etc,.)?: Unable Help needed moving to and from a bed to chair (including a wheelchair)?: Total Help needed walking in hospital room?: Total Help needed climbing 3-5 steps with a railing? : Total 6 Click Score: 6    End of Session   Activity Tolerance: Patient limited by fatigue;Patient limited by lethargy;Patient limited by pain Patient left: in bed;with bed alarm set;with call bell/phone within reach;with family/visitor present Nurse Communication: Mobility status       Time: [id number] PT Time Calculation (min) (ACUTE ONLY): 14 min  Charges:  $Therapeutic Exercise: 8-22 mins                    G Codes:       Danielle DessSarah Arrington Bencomo, PTA 05/16/17, 10:42 AM

## 2017-05-17 MED ORDER — MORPHINE SULFATE (CONCENTRATE) 10 MG/0.5ML PO SOLN
5.0000 mg | ORAL | Status: AC | PRN
Start: 2017-05-17 — End: ?

## 2017-05-17 MED ORDER — GLYCOPYRROLATE 0.2 MG/ML IJ SOLN: 0.2000 mg | INTRAMUSCULAR | Status: AC | PRN

## 2017-05-17 NOTE — Progress Notes (Signed)
Follow up visit made to new hospice home referral. Patient seen lying bed, eyes closed, opens to voice when called by name. Wet cough noted, family at bedside. Writer met with patient's son Roselyn Reef to initiate education regarding  Hospice services, philosophy and team approach to care with good understanding voiced. Questions answered, consents signed. Plan is for transport to the hospice home today. Report called to the hospice home, EMS notified for transport. Discharge summary faxed to referral. Hospital team  and family updated. Signed DNR in place in patient's discharge packet. Flo Shanks RN, BSN, [facility], hospital Liaison [phone]

## 2017-05-17 NOTE — Clinical Social Work Note (Signed)
CSW was informed that [facility] [place] [facility] [place] for [facility] today.  CSW updated physician and case manager, [facility] [address] [place] [facility] [place].  Patient and family agreeable to plans will transport via ems [facility] [place] nurse liasion RN to call report.  Patient's son William Lozano is at bedside and is aware of discharge today.  Windell MouldingEric Johnnisha Forton, MSW, Theresia MajorsLCSWA [phone]

## 2017-05-17 NOTE — Discharge Summary (Signed)
[facility] [place] [facility]   PATIENT NAME: William Lozano    MR#:  [id number]  DATE OF BIRTH:  19-Dec-1932  DATE OF ADMISSION:  05/08/2017 ADMITTING PHYSICIAN: Auburn Bilberry, MD  DATE OF DISCHARGE: 05/17/2017  PRIMARY CARE PHYSICIAN: Marguarite Arbour, MD    ADMISSION DIAGNOSIS:  Confusion [R41.0] Atrial fibrillation with RVR (HCC) [I48.91]  DISCHARGE DIAGNOSIS:  Active Problems:   A-fib (HCC)   Dementia with behavioral disturbance   Protein-calorie malnutrition (HCC)   Palliative care by specialist   Goals of care, counseling/discussion   SECONDARY DIAGNOSIS:   Past Medical History:  Diagnosis Date  . Arthritis   . Atrial fibrillation (HCC) 2016   paroxysmal atrial fibrillation, sick sinus syndrome  . Complication of anesthesia    affects memory for a longer time after  . Dementia    alzheimers  . Essential hypertension   . GERD (gastroesophageal reflux disease)   . History of kidney stones   . Hyperlipemia   . Inguinal hernia recurrent bilateral   . Kidney stones   . Stroke [facility])     HOSPITAL COURSE:   84 year old male with past medical history of dementia, hypertension, chronic atrial fibrillation, GERD, history of kidney stones, hyperlipidemia, osteoarthritis who presented to the hospital due to altered mental status/encephalopathy.  1.  Acute encephalopathy with underlying dementia-patient was treated for his encephalopathy with anxiolytics, Haldol as needed.  Despite that patient's encephalopathy continued to wax and wane.  He has periods of wakefulness and periods when he is significantly drowsy and does not follow any commands.  2. Atrial fibrillation with rapid ventricular response -this was been treated with oral metoprolol, Cardizem.  3.  Essential hypertension - BP stable on oral Cardizem, Metoprolol.   4.  History of seizures - pt. Will cont. His Keppra no seizures while in the hospital.   5.  Dementia  -Patient was  admitted to the hospital for encephalopathy and underlying dementia and had significant periods of sundowning.  Patient was treated supportively with anxiolytics, Haldol as needed. -Patient's p.o. intake continue to be poor despite being on appetite stimulants and given his progressive encephalopathy palliative care consult was obtained to discuss goals of care.  I had extensive discussions with the patient's son and family about his dementia getting progressively worse and patient not eating and drinking.  After discussions with palliative care and with me the family has agreed to make the patient comfort care only.  Patient is now being discharged to hospice home for further care. -Continue Roxanol, Robinul   DISCHARGE CONDITIONS:   Stable.   CONSULTS OBTAINED:  Treatment Team:  Dalia Heading, MD  DRUG ALLERGIES:   Allergies  Allergen Reactions  . Codeine Other (See Comments)    Causes side effects  . Penicillin G Rash    Has patient had a PCN reaction causing immediate rash, facial/tongue/throat swelling, SOB or lightheadedness with hypotension: Yes Has patient had a PCN reaction causing severe rash involving mucus membranes or skin necrosis: No Has patient had a PCN reaction that required hospitalization: No Has patient had a PCN reaction occurring within the last 10 years: No If all of the above answers are "NO", then may proceed with Cephalosporin use.  . Sulfa Antibiotics Rash    Pt and fam are saying he does not have allergy to sulfa now.    DISCHARGE MEDICATIONS:   Allergies as of 05/17/2017      Reactions   Codeine Other (See Comments)  Causes side effects   Penicillin G Rash   Has patient had a PCN reaction causing immediate rash, facial/tongue/throat swelling, SOB or lightheadedness with hypotension: Yes Has patient had a PCN reaction causing severe rash involving mucus membranes or skin necrosis: No Has patient had a PCN reaction that required hospitalization:  No Has patient had a PCN reaction occurring within the last 10 years: No If all of the above answers are "NO", then may proceed with Cephalosporin use.   Sulfa Antibiotics Rash   Pt and fam are saying he does not have allergy to sulfa now.      Medication List    STOP taking these medications   aspirin EC 81 MG tablet   diphenoxylate-atropine 2.5-0.025 MG tablet Commonly known as:  LOMOTIL   hydrochlorothiazide 25 MG tablet Commonly known as:  HYDRODIURIL   metoprolol succinate 25 MG 24 hr tablet Commonly known as:  TOPROL-XL   pantoprazole 40 MG tablet Commonly known as:  PROTONIX   rivastigmine 1.5 MG capsule Commonly known as:  EXELON   sertraline 50 MG tablet Commonly known as:  ZOLOFT   simvastatin 20 MG tablet Commonly known as:  ZOCOR   traMADol 50 MG tablet Commonly known as:  ULTRAM     TAKE these medications   glycopyrrolate 0.2 MG/ML injection Commonly known as:  ROBINUL Inject 1 mL (0.2 mg total) into the vein every 4 (four) hours as needed (secretions, gurgling).   levETIRAcetam 500 MG tablet Commonly known as:  KEPPRA Take one tablet nightly   morphine CONCENTRATE 10 MG/0.5ML Soln concentrated solution Take 0.25 mLs (5 mg total) by mouth every 3 (three) hours as needed for severe pain or shortness of breath.   QUEtiapine 25 MG tablet Commonly known as:  SEROQUEL Take 25 mg by mouth at bedtime.         DISCHARGE INSTRUCTIONS:   DIET:  Regular diet  DISCHARGE CONDITION:  Stable  ACTIVITY:  Activity as tolerated  OXYGEN:  Home Oxygen: No.   Oxygen Delivery: room air  DISCHARGE LOCATION:  Hospice Home.    If you experience worsening of your admission symptoms, develop shortness of breath, life threatening emergency, suicidal or homicidal thoughts you must seek medical attention immediately by calling 911 or calling your MD immediately  if symptoms less severe.  You Must read complete instructions/literature along with all the  possible adverse reactions/side effects for all the Medicines you take and that have been prescribed to you. Take any new Medicines after you have completely understood and accpet all the possible adverse reactions/side effects.   Please note  You were cared for by a hospitalist during your hospital stay. If you have any questions about your discharge medications or the care you received while you were in the hospital after you are discharged, you can call the unit and asked to speak with the hospitalist on call if the hospitalist that took care of you is not available. Once you are discharged, your primary care physician will handle any further medical issues. Please note that NO REFILLS for any discharge medications will be authorized once you are discharged, as it is imperative that you return to your primary care physician (or establish a relationship with a primary care physician if you do not have one) for your aftercare needs so that they can reassess your need for medications and monitor your lab values.     Today   No acute events overnight.  Remains lethargic but follows simple commands.  Will d/c to [facility]osp[facility] today.   VITAL SIGNS:  Blood pressure 113/76, pulse (!) 103, temperature 97.6 F (36.4 C), temperature source Oral, resp. rate 19, height 6' (1.829 m), weight 63 kg (139 lb), SpO2 97 %.  I/O:    Intake/Output Summary (Last 24 hours) at 05/17/2017 1040 Last data filed at 05/16/2017 2207 Gross per 24 hour  Intake 3 ml  Output -  Net 3 ml    PHYSICAL EXAMINATION:   GENERAL:  84 y.o.-year-old patient lying in bed encephalopathic and lethargic EYES: Pupils equal, round, reactive to light. No scleral icterus. Extraocular muscles intact.  HEENT: Head atraumatic, normocephalic. Oropharynx and nasopharynx clear.  Dry oral mucosa NECK:  Supple, no jugular venous distention. No thyroid enlargement, no tenderness.  LUNGS: Normal breath sounds bilaterally, no wheezing, rales,  rhonchi. No use of accessory muscles of respiration.  CARDIOVASCULAR: S1, S2 irregular. No murmurs, rubs, or gallops.  ABDOMEN: Soft, nontender, nondistended. Bowel sounds present. No organomegaly or mass.  EXTREMITIES: No cyanosis, clubbing or edema b/l.    NEUROLOGIC: Cranial nerves II through XII are intact. No focal Motor or sensory deficits b/l.  Globally weak PSYCHIATRIC: The patient is alert and oriented x 1.  SKIN: No obvious rash, lesion, or ulcer.    DATA REVIEW:   CBC Recent Labs  Lab 05/15/17 0807  WBC 18.4*  HGB 14.9  HCT 45.4  PLT 349    Chemistries  Recent Labs  Lab 05/14/17 1058  05/16/17 0806  NA 144   < > 147*  K 2.9*   < > 3.8  CL 112*   < > 110  CO2 24   < > 26  GLUCOSE 286*   < > 170*  BUN 20   < > 33*  CREATININE 0.95   < > 1.14  CALCIUM 8.8*   < > 9.0  MG 1.9  --   --    < > = values in this interval not displayed.    Cardiac Enzymes No results for input(s): TROPONINI in the last 168 hours.  Microbiology Results  Results for orders placed or performed during the hospital encounter of 05/08/17  Urine culture     Status: None   Collection Time: 05/08/17  3:09 PM  Result Value Ref Range Status   Specimen Description   Final    URINE, RANDOM Performed at [facility], [address], [place], [place] [address]    Special Requests   Final    NONE Performed at [facility], [address] [place], [place] [address]    Culture   Final    NO GROWTH Performed at [facility], [address], [place], [place] [address]    Report Status 05/09/2017 FINAL  Final    RADIOLOGY:  Dg Chest Port 1 View  Result Date: 05/15/2017 CLINICAL DATA:  Dyspnea, cough EXAM: PORTABLE CHEST 1 VIEW COMPARISON:  05/08/2017 chest radiograph. FINDINGS: Stable cardiomediastinal silhouette with mild cardiomegaly. No pneumothorax. Small bilateral pleural effusions. Mild pulmonary edema. Hazy bibasilar lung opacities. IMPRESSION: 1. Mild  cardiomegaly with mild pulmonary edema, compatible with mild congestive heart failure. 2. Small bilateral pleural effusions. 3. Hazy bibasilar lung opacities, which could represent atelectasis, aspiration and/or pneumonia. Electronically Signed   By: Delbert Phenix M.D.   On: 05/15/2017 18:40      Management plans discussed with the patient, family and they are in agreement.  CODE STATUS:     Code Status Orders  (From  admission, onward)        Start     Ordered   05/09/17 1324  Do not attempt resuscitation (DNR)  Continuous    Question Answer Comment  In the event of cardiac or respiratory ARREST Do not call a "code blue"   In the event of cardiac or respiratory ARREST Do not perform Intubation, CPR, defibrillation or ACLS   In the event of cardiac or respiratory ARREST Use medication by any route, position, wound care, and other measures to relive pain and suffering. May use oxygen, suction and manual treatment of airway obstruction as needed for comfort.      05/09/17 1323    TOTAL TIME TAKING CARE OF THIS PATIENT: 40 minutes.    Houston SirenSAINANI,Nailah Luepke J M.D on 05/17/2017 at 10:40 AM  Between 7am to 6pm - Pager - [phone]  After 6pm go to [email or web address] - Social research officer, governmentpassword [email or web address]  [facility] [place] [facility]  Office  [phone]  CC: Primary care physician; Marguarite ArbourSparks, Jeffrey D, MD

## 2017-05-17 NOTE — Progress Notes (Signed)
IV in place.Tele removed. 4 L of oxygen. Family was made aware of transfer. EMS to take to [facility]. Discharge instructions in packet.

## 2017-05-17 NOTE — Care Management Important Message (Signed)
Important Message  Patient Details  Name: William Lozano. MRN: [id number] Date of Birth: 11/12/1932   Medicare Important Message Given:  No  Discharging to Hospice Home for end of life care  [name], Carolene Gitto R, RN 05/17/2017, 10:55 AM

## 2017-06-20 DEATH — deceased

## 2018-03-01 ENCOUNTER — Ambulatory Visit: Payer: Medicare Other | Admitting: Urology
